# Patient Record
Sex: Female | Born: 1950 | Race: White | Hispanic: No | Marital: Married | Smoking: Never smoker
Health system: Southern US, Community
[De-identification: ages and names within clinical notes are randomized; demographics above are authoritative.]

## PROBLEM LIST (undated history)

## (undated) DIAGNOSIS — E559 Vitamin D deficiency, unspecified: Secondary | ICD-10-CM

## (undated) DIAGNOSIS — J189 Pneumonia, unspecified organism: Secondary | ICD-10-CM

## (undated) DIAGNOSIS — M76829 Posterior tibial tendinitis, unspecified leg: Secondary | ICD-10-CM

## (undated) DIAGNOSIS — E669 Obesity, unspecified: Secondary | ICD-10-CM

## (undated) DIAGNOSIS — I10 Essential (primary) hypertension: Secondary | ICD-10-CM

## (undated) DIAGNOSIS — N3010 Interstitial cystitis (chronic) without hematuria: Secondary | ICD-10-CM

## (undated) DIAGNOSIS — M549 Dorsalgia, unspecified: Secondary | ICD-10-CM

## (undated) DIAGNOSIS — G5760 Lesion of plantar nerve, unspecified lower limb: Secondary | ICD-10-CM

## (undated) DIAGNOSIS — R0602 Shortness of breath: Secondary | ICD-10-CM

## (undated) DIAGNOSIS — G5600 Carpal tunnel syndrome, unspecified upper limb: Secondary | ICD-10-CM

## (undated) DIAGNOSIS — E739 Lactose intolerance, unspecified: Secondary | ICD-10-CM

## (undated) DIAGNOSIS — I4891 Unspecified atrial fibrillation: Secondary | ICD-10-CM

## (undated) DIAGNOSIS — R600 Localized edema: Secondary | ICD-10-CM

## (undated) DIAGNOSIS — Z9289 Personal history of other medical treatment: Secondary | ICD-10-CM

## (undated) DIAGNOSIS — R002 Palpitations: Secondary | ICD-10-CM

## (undated) DIAGNOSIS — Z9981 Dependence on supplemental oxygen: Secondary | ICD-10-CM

## (undated) DIAGNOSIS — G4730 Sleep apnea, unspecified: Secondary | ICD-10-CM

## (undated) DIAGNOSIS — D649 Anemia, unspecified: Secondary | ICD-10-CM

## (undated) DIAGNOSIS — IMO0002 Reserved for concepts with insufficient information to code with codable children: Secondary | ICD-10-CM

## (undated) DIAGNOSIS — I272 Pulmonary hypertension, unspecified: Secondary | ICD-10-CM

## (undated) DIAGNOSIS — K589 Irritable bowel syndrome without diarrhea: Secondary | ICD-10-CM

## (undated) DIAGNOSIS — M2550 Pain in unspecified joint: Secondary | ICD-10-CM

## (undated) DIAGNOSIS — K219 Gastro-esophageal reflux disease without esophagitis: Secondary | ICD-10-CM

## (undated) DIAGNOSIS — E039 Hypothyroidism, unspecified: Secondary | ICD-10-CM

## (undated) DIAGNOSIS — J984 Other disorders of lung: Secondary | ICD-10-CM

## (undated) HISTORY — DX: Unspecified atrial fibrillation: I48.91

## (undated) HISTORY — DX: Lesion of plantar nerve, unspecified lower limb: G57.60

## (undated) HISTORY — DX: Reserved for concepts with insufficient information to code with codable children: IMO0002

## (undated) HISTORY — DX: Posterior tibial tendinitis, unspecified leg: M76.829

## (undated) HISTORY — PX: JOINT REPLACEMENT: SHX530

## (undated) HISTORY — PX: TOTAL HIP ARTHROPLASTY: SHX124

## (undated) HISTORY — DX: Gastro-esophageal reflux disease without esophagitis: K21.9

## (undated) HISTORY — DX: Dorsalgia, unspecified: M54.9

## (undated) HISTORY — DX: Irritable bowel syndrome, unspecified: K58.9

## (undated) HISTORY — DX: Carpal tunnel syndrome, unspecified upper limb: G56.00

## (undated) HISTORY — DX: Lactose intolerance, unspecified: E73.9

## (undated) HISTORY — DX: Hypothyroidism, unspecified: E03.9

## (undated) HISTORY — DX: Pain in unspecified joint: M25.50

## (undated) HISTORY — PX: CARDIAC CATHETERIZATION: SHX172

## (undated) HISTORY — DX: Essential (primary) hypertension: I10

## (undated) HISTORY — DX: Obesity, unspecified: E66.9

## (undated) HISTORY — PX: CARDIAC ELECTROPHYSIOLOGY MAPPING AND ABLATION: SHX1292

## (undated) HISTORY — PX: BACK SURGERY: SHX140

## (undated) HISTORY — PX: LASIK: SHX215

## (undated) HISTORY — DX: Vitamin D deficiency, unspecified: E55.9

## (undated) HISTORY — DX: Palpitations: R00.2

## (undated) HISTORY — DX: Localized edema: R60.0

---
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
## 1978-10-13 HISTORY — PX: TUBAL LIGATION: SHX77

## 1989-06-13 HISTORY — PX: CARPAL TUNNEL RELEASE: SHX101

## 1998-10-13 HISTORY — PX: DILATION AND CURETTAGE OF UTERUS: SHX78

## 1998-10-13 HISTORY — PX: VAGINAL HYSTERECTOMY: SUR661

## 1999-01-04 ENCOUNTER — Other Ambulatory Visit: Admission: RE | Admit: 1999-01-04 | Discharge: 1999-01-04 | Payer: Self-pay | Admitting: Obstetrics and Gynecology

## 1999-01-29 ENCOUNTER — Ambulatory Visit (HOSPITAL_COMMUNITY): Admission: RE | Admit: 1999-01-29 | Discharge: 1999-01-29 | Payer: Self-pay | Admitting: Obstetrics and Gynecology

## 1999-05-02 ENCOUNTER — Encounter (INDEPENDENT_AMBULATORY_CARE_PROVIDER_SITE_OTHER): Payer: Self-pay | Admitting: Specialist

## 1999-05-02 ENCOUNTER — Inpatient Hospital Stay (HOSPITAL_COMMUNITY): Admission: RE | Admit: 1999-05-02 | Discharge: 1999-05-05 | Payer: Self-pay | Admitting: Obstetrics and Gynecology

## 1999-06-14 HISTORY — PX: POSTERIOR FUSION LUMBAR SPINE: SUR632

## 1999-11-27 ENCOUNTER — Encounter: Admission: RE | Admit: 1999-11-27 | Discharge: 1999-11-27 | Payer: Self-pay | Admitting: Emergency Medicine

## 1999-11-27 ENCOUNTER — Encounter: Payer: Self-pay | Admitting: Emergency Medicine

## 2000-02-21 ENCOUNTER — Encounter: Admission: RE | Admit: 2000-02-21 | Discharge: 2000-02-21 | Payer: Self-pay | Admitting: Emergency Medicine

## 2000-02-21 ENCOUNTER — Encounter: Payer: Self-pay | Admitting: Emergency Medicine

## 2000-06-25 ENCOUNTER — Other Ambulatory Visit: Admission: RE | Admit: 2000-06-25 | Discharge: 2000-06-25 | Payer: Self-pay | Admitting: Obstetrics and Gynecology

## 2000-08-04 ENCOUNTER — Encounter: Admission: RE | Admit: 2000-08-04 | Discharge: 2000-08-25 | Payer: Self-pay | Admitting: Neurosurgery

## 2000-11-19 ENCOUNTER — Encounter: Admission: RE | Admit: 2000-11-19 | Discharge: 2000-11-19 | Payer: Self-pay | Admitting: Family Medicine

## 2000-11-19 ENCOUNTER — Encounter: Payer: Self-pay | Admitting: Family Medicine

## 2001-05-25 ENCOUNTER — Encounter: Payer: Self-pay | Admitting: Gastroenterology

## 2001-05-25 ENCOUNTER — Encounter: Admission: RE | Admit: 2001-05-25 | Discharge: 2001-05-25 | Payer: Self-pay | Admitting: Gastroenterology

## 2001-07-15 ENCOUNTER — Other Ambulatory Visit: Admission: RE | Admit: 2001-07-15 | Discharge: 2001-07-15 | Payer: Self-pay | Admitting: Obstetrics and Gynecology

## 2001-07-20 ENCOUNTER — Encounter: Admission: RE | Admit: 2001-07-20 | Discharge: 2001-07-20 | Payer: Self-pay | Admitting: Obstetrics and Gynecology

## 2001-07-20 ENCOUNTER — Encounter: Payer: Self-pay | Admitting: Obstetrics and Gynecology

## 2001-11-25 ENCOUNTER — Encounter: Admission: RE | Admit: 2001-11-25 | Discharge: 2001-12-17 | Payer: Self-pay | Admitting: Orthopedic Surgery

## 2001-12-28 ENCOUNTER — Encounter: Payer: Self-pay | Admitting: Family Medicine

## 2001-12-28 ENCOUNTER — Encounter: Admission: RE | Admit: 2001-12-28 | Discharge: 2001-12-28 | Payer: Self-pay | Admitting: Family Medicine

## 2002-06-24 ENCOUNTER — Ambulatory Visit (HOSPITAL_COMMUNITY): Admission: RE | Admit: 2002-06-24 | Discharge: 2002-06-24 | Payer: Self-pay | Admitting: Gastroenterology

## 2002-07-07 ENCOUNTER — Other Ambulatory Visit: Admission: RE | Admit: 2002-07-07 | Discharge: 2002-07-07 | Payer: Self-pay | Admitting: Obstetrics and Gynecology

## 2003-01-24 ENCOUNTER — Inpatient Hospital Stay (HOSPITAL_COMMUNITY): Admission: RE | Admit: 2003-01-24 | Discharge: 2003-01-27 | Payer: Self-pay | Admitting: Neurosurgery

## 2003-01-24 ENCOUNTER — Encounter: Payer: Self-pay | Admitting: Neurosurgery

## 2003-02-19 ENCOUNTER — Encounter: Payer: Self-pay | Admitting: *Deleted

## 2003-02-19 ENCOUNTER — Inpatient Hospital Stay (HOSPITAL_COMMUNITY): Admission: EM | Admit: 2003-02-19 | Discharge: 2003-02-27 | Payer: Self-pay | Admitting: *Deleted

## 2003-02-20 ENCOUNTER — Encounter: Payer: Self-pay | Admitting: Family Medicine

## 2003-02-21 ENCOUNTER — Encounter: Payer: Self-pay | Admitting: Family Medicine

## 2003-02-21 ENCOUNTER — Encounter (INDEPENDENT_AMBULATORY_CARE_PROVIDER_SITE_OTHER): Payer: Self-pay | Admitting: Cardiology

## 2003-03-03 ENCOUNTER — Emergency Department (HOSPITAL_COMMUNITY): Admission: EM | Admit: 2003-03-03 | Discharge: 2003-03-03 | Payer: Self-pay | Admitting: Emergency Medicine

## 2003-03-08 ENCOUNTER — Encounter: Admission: RE | Admit: 2003-03-08 | Discharge: 2003-03-08 | Payer: Self-pay | Admitting: Family Medicine

## 2003-06-05 ENCOUNTER — Ambulatory Visit (HOSPITAL_COMMUNITY): Admission: RE | Admit: 2003-06-05 | Discharge: 2003-06-05 | Payer: Self-pay | Admitting: Neurosurgery

## 2003-06-05 ENCOUNTER — Encounter: Payer: Self-pay | Admitting: Neurosurgery

## 2003-06-15 ENCOUNTER — Inpatient Hospital Stay (HOSPITAL_COMMUNITY): Admission: RE | Admit: 2003-06-15 | Discharge: 2003-06-18 | Payer: Self-pay | Admitting: Neurosurgery

## 2003-06-15 ENCOUNTER — Encounter: Payer: Self-pay | Admitting: Neurosurgery

## 2004-03-01 ENCOUNTER — Ambulatory Visit (HOSPITAL_COMMUNITY): Admission: RE | Admit: 2004-03-01 | Discharge: 2004-03-01 | Payer: Self-pay | Admitting: Cardiology

## 2004-03-13 ENCOUNTER — Other Ambulatory Visit: Admission: RE | Admit: 2004-03-13 | Discharge: 2004-03-13 | Payer: Self-pay | Admitting: Obstetrics and Gynecology

## 2004-06-16 ENCOUNTER — Encounter: Admission: RE | Admit: 2004-06-16 | Discharge: 2004-06-16 | Payer: Self-pay | Admitting: Orthopedic Surgery

## 2007-04-30 ENCOUNTER — Encounter: Admission: RE | Admit: 2007-04-30 | Discharge: 2007-04-30 | Payer: Self-pay | Admitting: Endocrinology

## 2007-10-14 HISTORY — PX: LAPAROSCOPIC GASTRIC BANDING: SHX1100

## 2007-11-30 ENCOUNTER — Ambulatory Visit (HOSPITAL_COMMUNITY): Admission: RE | Admit: 2007-11-30 | Discharge: 2007-11-30 | Payer: Self-pay | Admitting: Surgery

## 2007-12-08 ENCOUNTER — Encounter: Admission: RE | Admit: 2007-12-08 | Discharge: 2007-12-08 | Payer: Self-pay | Admitting: Surgery

## 2007-12-10 ENCOUNTER — Ambulatory Visit (HOSPITAL_COMMUNITY): Admission: RE | Admit: 2007-12-10 | Discharge: 2007-12-10 | Payer: Self-pay | Admitting: Surgery

## 2008-04-25 ENCOUNTER — Encounter: Admission: RE | Admit: 2008-04-25 | Discharge: 2008-07-04 | Payer: Self-pay | Admitting: Surgery

## 2008-05-09 ENCOUNTER — Encounter (INDEPENDENT_AMBULATORY_CARE_PROVIDER_SITE_OTHER): Payer: Self-pay | Admitting: Surgery

## 2008-05-09 ENCOUNTER — Ambulatory Visit (HOSPITAL_COMMUNITY): Admission: RE | Admit: 2008-05-09 | Discharge: 2008-05-10 | Payer: Self-pay | Admitting: Surgery

## 2008-08-03 ENCOUNTER — Encounter: Admission: RE | Admit: 2008-08-03 | Discharge: 2008-09-11 | Payer: Self-pay | Admitting: Surgery

## 2008-09-14 ENCOUNTER — Encounter: Admission: RE | Admit: 2008-09-14 | Discharge: 2008-12-13 | Payer: Self-pay | Admitting: Surgery

## 2008-09-15 ENCOUNTER — Encounter: Admission: RE | Admit: 2008-09-15 | Discharge: 2008-09-15 | Payer: Self-pay | Admitting: Internal Medicine

## 2008-12-27 ENCOUNTER — Encounter: Admission: RE | Admit: 2008-12-27 | Discharge: 2008-12-27 | Payer: Self-pay | Admitting: Surgery

## 2009-01-08 ENCOUNTER — Encounter: Admission: RE | Admit: 2009-01-08 | Discharge: 2009-01-08 | Payer: Self-pay | Admitting: Orthopaedic Surgery

## 2009-03-06 ENCOUNTER — Ambulatory Visit (HOSPITAL_COMMUNITY): Admission: RE | Admit: 2009-03-06 | Discharge: 2009-03-06 | Payer: Self-pay | Admitting: Cardiology

## 2009-03-14 ENCOUNTER — Encounter: Admission: RE | Admit: 2009-03-14 | Discharge: 2009-03-14 | Payer: Self-pay | Admitting: Surgery

## 2009-03-20 ENCOUNTER — Encounter: Admission: RE | Admit: 2009-03-20 | Discharge: 2009-03-20 | Payer: Self-pay | Admitting: Orthopaedic Surgery

## 2009-05-08 ENCOUNTER — Encounter: Admission: RE | Admit: 2009-05-08 | Discharge: 2009-08-06 | Payer: Self-pay | Admitting: Obstetrics and Gynecology

## 2009-06-20 ENCOUNTER — Encounter: Admission: RE | Admit: 2009-06-20 | Discharge: 2009-06-20 | Payer: Self-pay | Admitting: Orthopaedic Surgery

## 2009-08-03 ENCOUNTER — Inpatient Hospital Stay (HOSPITAL_COMMUNITY): Admission: RE | Admit: 2009-08-03 | Discharge: 2009-08-08 | Payer: Self-pay | Admitting: Orthopaedic Surgery

## 2009-12-07 ENCOUNTER — Encounter: Payer: Self-pay | Admitting: Cardiology

## 2009-12-12 ENCOUNTER — Encounter: Payer: Self-pay | Admitting: Cardiology

## 2009-12-13 ENCOUNTER — Ambulatory Visit: Payer: Self-pay | Admitting: Cardiology

## 2009-12-13 DIAGNOSIS — R0602 Shortness of breath: Secondary | ICD-10-CM

## 2009-12-19 ENCOUNTER — Encounter: Admission: RE | Admit: 2009-12-19 | Discharge: 2009-12-19 | Payer: Self-pay | Admitting: Orthopaedic Surgery

## 2009-12-26 ENCOUNTER — Encounter: Payer: Self-pay | Admitting: Cardiology

## 2009-12-26 ENCOUNTER — Ambulatory Visit (HOSPITAL_COMMUNITY): Admission: RE | Admit: 2009-12-26 | Discharge: 2009-12-26 | Payer: Self-pay | Admitting: Cardiology

## 2009-12-26 ENCOUNTER — Ambulatory Visit: Payer: Self-pay

## 2009-12-26 ENCOUNTER — Ambulatory Visit: Payer: Self-pay | Admitting: Cardiology

## 2010-04-17 ENCOUNTER — Telehealth: Payer: Self-pay | Admitting: Cardiology

## 2010-04-18 ENCOUNTER — Encounter: Admission: RE | Admit: 2010-04-18 | Discharge: 2010-04-18 | Payer: Self-pay | Admitting: Orthopaedic Surgery

## 2010-06-20 ENCOUNTER — Ambulatory Visit: Payer: Self-pay | Admitting: Cardiology

## 2010-06-20 DIAGNOSIS — I10 Essential (primary) hypertension: Secondary | ICD-10-CM

## 2010-07-31 ENCOUNTER — Telehealth (INDEPENDENT_AMBULATORY_CARE_PROVIDER_SITE_OTHER): Payer: Self-pay | Admitting: *Deleted

## 2010-08-09 ENCOUNTER — Inpatient Hospital Stay (HOSPITAL_COMMUNITY): Admission: RE | Admit: 2010-08-09 | Discharge: 2010-08-13 | Payer: Self-pay | Admitting: Orthopaedic Surgery

## 2010-11-12 NOTE — Assessment & Plan Note (Signed)
Summary: f42m   Primary Provider:  Dr. Christell Constant  CC:  6 month follow up. pt states she is doing okay.  Pt getting ready for second hip operation Oct. 28th.   .  History of Present Illness: 60 yo with history of morbid obesity s/p lap band surgery and atrial fibrillation s/p ablation presents for cardiology followup.  Patient developed atrial fibrillation in 2004.  She had a cath in 2004 showing no significant CAD.  She had atrial fibrillation ablation in 2005 with a redo in 2007.  She has had no documented atrial fibrillation since 2007 and is not taking coumadin any longer.  She had a right hip replacement in 10/10.  She has had no lightheadedness or syncope.  Echo in 3/11 showed normal LV systolic function and no significant valvular dysfunction.  She does occasionally feel her heart flutter.  She now has significant pain in her left hip and left THR is planned for 10/11.  She has too much pain to do much exercise.  She could climb a flight of steps if she had to, but it would be difficult due to the pain.  No significant exertional dyspnea or chest pain.  Weight is down 6 lbs since last appointment.   ECG: NSR, normal  Labs (3/11): BNP 65, K 4.2, creatinine 0.83, TSH normal, HCT 39.1  Current Medications (verified): 1)  Lisinopril 20 Mg Tabs (Lisinopril) .... Take One Tablet Once Daily 2)  Vitamin D 1000 Unit Tabs (Cholecalciferol) .... Take One Tablet Three Times A Day 3)  Celebrex 200 Mg Caps (Celecoxib) .... Take One Tablet Two Times A Day 4)  Aspirin 81 Mg Tabs (Aspirin) .... Once Daily 5)  Tramadol Hcl 50 Mg Tabs (Tramadol Hcl) .... 1/2 Tablet in The Am and 1 Tablet in The Pm 6)  Dhea 25 Mg Tabs (Prasterone (Dhea)) .... Once Daily  Allergies (verified): 1)  ! Keflex 2)  ! Sulfa 3)  ! Pcn 4)  ! * Avelox 5)  ! Flecainide Acetate  Past History:  Past Medical History: 1. L4/ L5 disk disease, s/p surgery 2. hypertension 3. Obesity: s/p Lap band surgery 7/09 4. Atrial fibrillation:  Began in 2004.  Had ablations at [facility] in 2005 and 2007.  She is off coumadin now with no noted recurrent atrial fibrillation since 2007.  5. Left heart cath (2004): No significant CAD, false + cardiolite. EF 60%.  6. Echo (3/11): LV EF 55-60%, no significant valvular dysfunction, RV-RA gradient 26 mmHg.  7. Hypothyroidism 8. Right hip replacement 10/10 9. GERD: Resolved after lap band  Family History: Reviewed history from 12/13/2009 and no changes required. Grandfather with 6 heart attacks, she is not sure what age he first had a heart attack.  Father with hypertension and diabetes, mother on medicines for irregular heart beat.   Family history negative for cancer or thyroid disease.  Social History: Reviewed history from 12/13/2009 and no changes required. 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  Alcohol Use - no  Review of Systems       All systems reviewed and negative except as per HPI.   Vital Signs:  Patient profile:   60 year old female Height:      66 inches Weight:      279 pounds BMI:     45.19 Pulse rate:   57 / minute Pulse rhythm:   regular BP sitting:   144 / 84  (left  arm) Cuff size:   large  Vitals Entered By: Judithe Modest CMA (June 20, 2010 3:55 PM)  Physical Exam  General:  Well developed, well nourished, in no acute distress. Obese.  Neck:  Neck supple,JVP 7 cm. No masses, thyromegaly or abnormal cervical nodes. Lungs:  Clear bilaterally to auscultation and percussion. Heart:  Non-displaced PMI, chest non-tender; regular rate and rhythm, S1, S2 without murmurs, rubs or gallops. Carotid upstroke normal, no bruit. Pedals normal pulses. No edema, no varicosities. Abdomen:  Bowel sounds positive; abdomen soft and non-tender without masses, organomegaly, or hernias noted. No hepatosplenomegaly. Obese.  Extremities:  No clubbing or cyanosis. Neurologic:  Alert and oriented x 3. Psych:  Normal  affect.   Impression & Recommendations:  Problem # 1:  ATRIAL FIBRILLATION (ICD-427.31) In sinus rhythm today.  No known recurrence of atrial fibrillation since repeat ablation in 2007.  However, she does have occasional palpitations.  I will get a 3 week event monitor to make sure that she does not have recurrent atrial fibrillation, in which case we would need to discuss anticoagulation.  She will take ASA 325 mg daily.   Problem # 2:  HYPERTENSION, UNSPECIFIED (ICD-401.9) BP is mildly elevated.  Will continue current dose of lisinopril for now.   Problem # 3:  PRE-OPERATIVE ASSESSMENT Patient is not high risk from a cardiovascular perspective for surgery.  She will be at risk of recurrent atrial fibrillation peri-operatively.  No further cardiac testing is necessary.   Patient Instructions: 1)  Your physician has recommended you make the following change in your medication:  2)  Increase Aspirin to 325mg  daily.-this should be buffered or coated. 3)  Your physician has recommended that you wear an event monitor.  Event monitors are medical devices that record the heart's electrical activity. Doctors most often use these monitors to diagnose arrhythmias. Arrhythmias are problems with the speed or rhythm of the heartbeat. The monitor is a small, portable device. You can wear one while you do your normal daily activities. This is usually used to diagnose what is causing palpitations/syncope (passing out). 3 WEEK MONITOR 4)  Your physician wants you to follow-up in: 6 months with Dr Shirlee Latch.  You will receive a reminder letter in the mail two months in advance. If you don't receive a letter, please call our office to schedule the follow-up appointment.

## 2010-11-12 NOTE — Assessment & Plan Note (Signed)
Summary: np6/get established/jml   Primary Provider:  Dr. Christell Constant  CC:  new patient to establish.  History of Present Illness: 60 yo with history of morbid obesity s/p lap band surgery and atrial fibrillation s/p ablation presents to establish cardiology followup.  Patient developed atrial fibrillation in 2004.  She had a cath in 2004 showing no significant CAD.  She had atrial fibrillation ablation in 2005 with a redo in 2007.  She has had no documented atrial fibrillation since 2007 and is not taking coumadin any longer.  She had a right hip replacement in 10/10.  She has been fatigued since surgery but has been trying to walk.  She was only able to walk minimally for the 6 months prior to surgery and became significantly deconditioned.  Currently, she is talking 2-3 laps around her driveway (about 50 yards) before becoming short of breath. No chest pain.  She is not short of breath walking in her house.  No ortohpnea/PND.  She can climb 1 flight of steps but is short of breath at the top.  No palpitations, lightheadedness, or syncope.   ECG: NSR, 1st degree AV block  Labs (3/11): BNP 65, K 4.2, creatinine 0.83, TSH normal, HCT 39.1  Current Medications (verified): 1)  Lisinopril 20 Mg Tabs (Lisinopril) .... Take One Tablet Once Daily 2)  Vitamin D 1000 Unit Tabs (Cholecalciferol) .... Take One Tablet Three Times A Day 3)  Nabumetone 500 Mg Tabs (Nabumetone) .... Take One Tablet Two Times A Day 4)  Aspirin 81 Mg Tabs (Aspirin) .... Once Daily  Allergies (verified): 1)  ! Keflex 2)  ! Sulfa 3)  ! Pcn 4)  ! * Avelox 5)  ! Flecainide Acetate  Past History:  Past Medical History: 1. L4/ L5 disk disease, s/p surgery 2. hypertension 3. Obesity: s/p Lap band surgery 7/09 4. Atrial fibrillation: Began in 2004.  Had ablations at [facility] in 2005 and 2007.  She is off coumadin now with no noted recurrent atrial fibrillation since 2007.  5. Left heart cath (2004): No significant CAD, false  + cardiolite. EF 60%.  6. 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 Hypothyroidism 8. Right hip replacement 10/10 9. GERD: Resolved after lap band  Family History: Grandfather with 6 heart attacks, she is not sure what age he first had a heart attack.  Father with hypertension and diabetes, mother on medicines for irregular heart beat.   Family history negative for cancer or thyroid disease.  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  Alcohol Use - no  Review of Systems       All systems reviewed and negative except as per HPI.   Vital Signs:  Patient profile:   60 year old female Height:      66 inches Weight:      285 pounds BMI:     46.17 Pulse rate:   64 / minute Pulse rhythm:   regular BP sitting:   122 / 82  (left arm) Cuff size:   large  Vitals Entered By: Judithe Modest CMA (December 13, 2009 4:26 PM)  Physical Exam  General:  Well developed, well nourished, in no acute distress. Obese.  Head:  normocephalic and atraumatic Nose:  no deformity, discharge, inflammation, or lesions Mouth:  Teeth, gums and palate normal. Oral mucosa normal. Neck:  Neck supple,JVP 8 cm. No masses, thyromegaly or abnormal cervical  nodes. Lungs:  Clear bilaterally to auscultation and percussion. Heart:  Non-displaced PMI, chest non-tender; regular rate and rhythm, S1, S2 without murmurs, rubs or gallops. Carotid upstroke normal, no bruit. Pedals normal pulses. No edema, no varicosities. Abdomen:  Bowel sounds positive; abdomen soft and non-tender without masses, organomegaly, or hernias noted. No hepatosplenomegaly. Obese.  Extremities:  No clubbing or cyanosis. Neurologic:  Alert and oriented x 3. Skin:  Intact without lesions or rashes. Psych:  Normal affect.   Impression & Recommendations:  Problem # 1:  SHORTNESS OF BREATH (ICD-786.05) I suspect that this is due to obesity, deconditioning, and  possible mild diastolic CHF.  We will get an echocardiogram to assess LV systolic and diastolic function.  She needs to work on gradually increasing her activity level.   Problem # 2:  ATRIAL FIBRILLATION (ICD-427.31) In sinus rhythm today.  No known recurrence of atrial fibrillation since repeat ablation in 2007.  Continue ASA.    Patient had lipids done yesterday, will review the results.   Other Orders: Echocardiogram (Echo)  Patient Instructions: 1)  Your physician has requested that you have an echocardiogram.  Echocardiography is a painless test that uses sound waves to create images of your heart. It provides your doctor with information about the size and shape of your heart and how well your heart's chambers and valves are working.  This procedure takes approximately one hour. There are no restrictions for this procedure. 2)  Wednesday March 16,2011 at 4PM--this will be at [address] [employer] [address] 3)  Your physician wants you to follow-up in:  6 months with Dr [name].  You will receive a reminder letter in the mail two months in advance. If you don't receive a letter, please call our office to schedule the follow-up appointment.

## 2010-11-12 NOTE — Progress Notes (Signed)
Summary: Faxed LOV, Chest X-ray, Echo, & EKG to Elease Hashimoto at [facility] Pre[facility]  Faxed LOV, Chest X-ray, Echo, & EKG to Elease Hashimoto at [facility] [phone] F). Marylou Mccoy  July 31, 2010 11:06 AM

## 2010-11-12 NOTE — Consult Note (Signed)
Summary: [facility]  [facility]   Imported By: Marylou Mccoy 02/04/2010 11:57:48  _____________________________________________________________________  External Attachment:    Type:   Image     Comment:   External Document

## 2010-11-12 NOTE — Progress Notes (Signed)
Summary: refill request  Phone Note Refill Request Message from:  Patient on April 17, 2010 1:22 PM  Refills Requested: Medication #1:  LISINOPRIL 20 MG TABS take one tablet once daily [facility]   Method Requested: Telephone to Pharmacy Initial call taken by: Glynda Jaeger,  April 17, 2010 1:22 PM  Follow-up for Phone Call        [facility] Katina Dung, RN, BSN  April 17, 2010 2:24 PM  talked with pt by telephone-- Rx into pharmacy- appt made for pt with Dr Shirlee Latch for 06/20/10    New/Updated Medications: LISINOPRIL 20 MG TABS (LISINOPRIL) take one tablet once daily Prescriptions: LISINOPRIL 20 MG TABS (LISINOPRIL) take one tablet once daily  #30 x 6   Entered by:   Katina Dung, RN, BSN   Authorized by:   [name], MD   Signed by:   Katina Dung, RN, BSN on 04/17/2010   Method used:   [facility]  [address] [id number]       [address]       [place], [place]  [address]       Ph: [phone]       Fax: [phone]   RxID:   [id number]

## 2010-12-25 LAB — TYPE AND SCREEN
ABO/RH(D): A POS
Unit division: 0

## 2010-12-25 LAB — CBC
HCT: 33 % — ABNORMAL LOW (ref 36.0–46.0)
Hemoglobin: 11.1 g/dL — ABNORMAL LOW (ref 12.0–15.0)
Hemoglobin: 12.7 g/dL (ref 12.0–15.0)
Hemoglobin: 7.9 g/dL — ABNORMAL LOW (ref 12.0–15.0)
MCH: 29.1 pg (ref 26.0–34.0)
MCH: 29.6 pg (ref 26.0–34.0)
MCH: 29.6 pg (ref 26.0–34.0)
MCH: 29.7 pg (ref 26.0–34.0)
MCHC: 33.7 g/dL (ref 30.0–36.0)
MCHC: 33.8 g/dL (ref 30.0–36.0)
MCHC: 34.1 g/dL (ref 30.0–36.0)
MCV: 86.1 fL (ref 78.0–100.0)
MCV: 86.9 fL (ref 78.0–100.0)
MCV: 87 fL (ref 78.0–100.0)
MCV: 87.8 fL (ref 78.0–100.0)
Platelets: 140 10*3/uL — ABNORMAL LOW (ref 150–400)
Platelets: 144 10*3/uL — ABNORMAL LOW (ref 150–400)
Platelets: 172 10*3/uL (ref 150–400)
Platelets: 218 10*3/uL (ref 150–400)
RBC: 2.68 MIL/uL — ABNORMAL LOW (ref 3.87–5.11)
RBC: 2.87 MIL/uL — ABNORMAL LOW (ref 3.87–5.11)
RBC: 3.24 MIL/uL — ABNORMAL LOW (ref 3.87–5.11)
RBC: 3.77 MIL/uL — ABNORMAL LOW (ref 3.87–5.11)
WBC: 10.6 10*3/uL — ABNORMAL HIGH (ref 4.0–10.5)
WBC: 8.7 10*3/uL (ref 4.0–10.5)

## 2010-12-25 LAB — BASIC METABOLIC PANEL
BUN: 8 mg/dL (ref 6–23)
BUN: 9 mg/dL (ref 6–23)
CO2: 29 mEq/L (ref 19–32)
CO2: 30 mEq/L (ref 19–32)
CO2: 31 mEq/L (ref 19–32)
CO2: 31 mEq/L (ref 19–32)
Calcium: 8.1 mg/dL — ABNORMAL LOW (ref 8.4–10.5)
Calcium: 8.3 mg/dL — ABNORMAL LOW (ref 8.4–10.5)
Calcium: 8.3 mg/dL — ABNORMAL LOW (ref 8.4–10.5)
Calcium: 9.5 mg/dL (ref 8.4–10.5)
Chloride: 105 mEq/L (ref 96–112)
Chloride: 106 mEq/L (ref 96–112)
Creatinine, Ser: 0.72 mg/dL (ref 0.4–1.2)
Creatinine, Ser: 0.83 mg/dL (ref 0.4–1.2)
GFR calc Af Amer: >60 mL/min (ref >60)
GFR calc Af Amer: >60 mL/min (ref >60)
GFR calc non Af Amer: >60 mL/min (ref >60)
Glucose, Bld: 109 mg/dL — ABNORMAL HIGH (ref 70–99)
Glucose, Bld: 128 mg/dL — ABNORMAL HIGH (ref 70–99)
Glucose, Bld: 77 mg/dL (ref 70–99)
Sodium: 140 mEq/L (ref 135–145)
Sodium: 141 mEq/L (ref 135–145)

## 2010-12-25 LAB — PROTIME-INR
INR: 1.41 (ref 0.00–1.49)
INR: 1.74 — ABNORMAL HIGH (ref 0.00–1.49)
Prothrombin Time: 13.1 seconds (ref 11.6–15.2)
Prothrombin Time: 15.7 seconds — ABNORMAL HIGH (ref 11.6–15.2)
Prothrombin Time: 20.5 seconds — ABNORMAL HIGH (ref 11.6–15.2)

## 2010-12-25 LAB — SURGICAL PCR SCREEN: MRSA, PCR: NEGATIVE

## 2010-12-25 LAB — POCT I-STAT 4, (NA,K, GLUC, HGB,HCT)
HCT: 30 % — ABNORMAL LOW (ref 36.0–46.0)
Sodium: 138 mEq/L (ref 135–145)

## 2011-01-04 ENCOUNTER — Encounter: Payer: Self-pay | Admitting: Cardiology

## 2011-01-14 ENCOUNTER — Encounter: Payer: Self-pay | Admitting: Cardiology

## 2011-01-14 ENCOUNTER — Ambulatory Visit (INDEPENDENT_AMBULATORY_CARE_PROVIDER_SITE_OTHER): Payer: BC Managed Care – PPO | Admitting: Cardiology

## 2011-01-14 DIAGNOSIS — I4891 Unspecified atrial fibrillation: Secondary | ICD-10-CM

## 2011-01-14 DIAGNOSIS — I10 Essential (primary) hypertension: Secondary | ICD-10-CM

## 2011-01-14 NOTE — Patient Instructions (Signed)
Schedule an appointment to see Dr Shirlee Latch in year with Dr Shirlee Latch.(April 2013).

## 2011-01-15 NOTE — Assessment & Plan Note (Signed)
No known atrial fibrillation recurrence since ablation in 2007.  No afib symptoms.  Continue ASA 325.

## 2011-01-15 NOTE — Progress Notes (Signed)
History of Present Illness: 60 yo with history of morbid obesity s/p lap band surgery and atrial fibrillation s/p ablation presents for cardiology followup.  Patient developed atrial fibrillation in 2004.  She had a cath in 2004 showing no significant CAD.  She had atrial fibrillation ablation in 2005 with a redo in 2007.  She has had no documented atrial fibrillation since 2007 and is not taking coumadin any longer.  She had a right hip replacement in 10/10 and a left hip replacement in 10/11.  This was uncomplicated.   Echo in 3/11 showed normal LV systolic function and no significant valvular dysfunction.    Hip pain is resolved s/p L THR.  She is doing well, walking in her yard without dyspnea.  No chest pain, no palpitations.  Unfortunately, her weight has not gone down.   ECG: NSR, normal  Labs (3/11): BNP 65, K 4.2, creatinine 0.83, TSH normal, HCT 39.1  Allergies (verified):  1)  ! Keflex 2)  ! Sulfa 3)  ! Pcn 4)  ! * Avelox 5)  ! Flecainide Acetate  Past Medical History: 1. L4/ L5 disk disease, s/p surgery 2. hypertension 3. Obesity: s/p Lap band surgery 7/09 4. Atrial fibrillation: Began in 2004.  Had ablations at [facility] in 2005 and 2007.  She is off coumadin now with no noted recurrent atrial fibrillation since 2007.  5. Left heart cath (2004): No significant CAD, false + cardiolite. EF 60%.  6. Echo (3/11): LV EF 55-60%, no significant valvular dysfunction, RV-RA gradient 26 mmHg.  7. Hypothyroidism 8. Right hip replacement 10/10, left hip replacement 10/11 9. GERD: Resolved after lap band  Family History: Grandfather with 6 heart attacks, she is not sure what age he first had a heart attack.  Father with hypertension and diabetes, mother on medicines for irregular heart beat.   Family history negative for cancer or thyroid disease.  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  Alcohol Use - no  Current  Outpatient Prescriptions  Medication Sig Dispense Refill  . aspirin 325 MG tablet Take 325 mg by mouth daily.        . Cholecalciferol (VITAMIN D-3) 5000 UNITS TABS Take 5,000 mg by mouth. Take 5000 mg daily Monday - Friday.       Marland Kitchen DHEA 25 MG CAPS daliy       . lisinopril (PRINIVIL,ZESTRIL) 20 MG tablet Take 20 mg by mouth daily.          BP 132/84  Pulse 59  Resp 18  Ht 5\' 6"  (1.676 m)  Wt 280 lb (127.007 kg)  BMI 45.19 kg/m2 General:  Well developed, well nourished, in no acute distress. Obese.  Neck:  Neck supple,JVP 7 cm. No masses, thyromegaly or abnormal cervical nodes. Lungs:  Clear bilaterally to auscultation and percussion. Heart:  Non-displaced PMI, chest non-tender; regular rate and rhythm, S1, S2 without murmurs, rubs or gallops. Carotid upstroke normal, no bruit. Pedals normal pulses. No edema, no varicosities. Abdomen:  Bowel sounds positive; abdomen soft and non-tender without masses, organomegaly, or hernias noted. No hepatosplenomegaly. Obese.  Extremities:  No clubbing or cyanosis. Neurologic:  Alert and oriented x 3. Psych:  Normal affect.

## 2011-01-15 NOTE — Assessment & Plan Note (Signed)
BP under good control on lisinopril.

## 2011-01-16 LAB — BASIC METABOLIC PANEL
BUN: 6 mg/dL (ref 6–23)
BUN: 9 mg/dL (ref 6–23)
CO2: 29 mEq/L (ref 19–32)
CO2: 30 mEq/L (ref 19–32)
Calcium: 9.5 mg/dL (ref 8.4–10.5)
Chloride: 101 mEq/L (ref 96–112)
Chloride: 101 mEq/L (ref 96–112)
Chloride: 103 mEq/L (ref 96–112)
Creatinine, Ser: 0.67 mg/dL (ref 0.4–1.2)
Creatinine, Ser: 0.69 mg/dL (ref 0.4–1.2)
Creatinine, Ser: 0.74 mg/dL (ref 0.4–1.2)
GFR calc Af Amer: >60 mL/min (ref >60)
GFR calc Af Amer: >60 mL/min (ref >60)
GFR calc non Af Amer: >60 mL/min (ref >60)
GFR calc non Af Amer: >60 mL/min (ref >60)
Glucose, Bld: 111 mg/dL — ABNORMAL HIGH (ref 70–99)
Glucose, Bld: 112 mg/dL — ABNORMAL HIGH (ref 70–99)
Glucose, Bld: 128 mg/dL — ABNORMAL HIGH (ref 70–99)
Potassium: 3.8 mEq/L (ref 3.5–5.1)
Potassium: 4.5 mEq/L (ref 3.5–5.1)
Sodium: 135 mEq/L (ref 135–145)
Sodium: 140 mEq/L (ref 135–145)

## 2011-01-16 LAB — PROTIME-INR
INR: 0.91 (ref 0.00–1.49)
INR: 1.23 (ref 0.00–1.49)
Prothrombin Time: 14.7 seconds (ref 11.6–15.2)
Prothrombin Time: 15.2 seconds (ref 11.6–15.2)

## 2011-01-16 LAB — CROSSMATCH

## 2011-01-16 LAB — DIFFERENTIAL
Basophils Absolute: 0 10*3/uL (ref 0.0–0.1)
Basophils Relative: 1 % (ref 0–1)
Lymphocytes Relative: 18 % (ref 12–46)
Monocytes Absolute: 0.3 10*3/uL (ref 0.1–1.0)
Monocytes Relative: 5 % (ref 3–12)
Neutro Abs: 5 10*3/uL (ref 1.7–7.7)
Neutrophils Relative %: 76 % (ref 43–77)

## 2011-01-16 LAB — URINALYSIS, ROUTINE W REFLEX MICROSCOPIC
Nitrite: NEGATIVE
Protein, ur: NEGATIVE mg/dL
Specific Gravity, Urine: 1.025 (ref 1.005–1.030)
Urobilinogen, UA: 0.2 mg/dL (ref 0.0–1.0)

## 2011-01-16 LAB — CBC
HCT: 21.1 % — ABNORMAL LOW (ref 36.0–46.0)
HCT: 24.7 % — ABNORMAL LOW (ref 36.0–46.0)
HCT: 25.5 % — ABNORMAL LOW (ref 36.0–46.0)
Hemoglobin: 12.3 g/dL (ref 12.0–15.0)
Hemoglobin: 7.3 g/dL — CL (ref 12.0–15.0)
Hemoglobin: 8.4 g/dL — ABNORMAL LOW (ref 12.0–15.0)
Hemoglobin: 8.5 g/dL — ABNORMAL LOW (ref 12.0–15.0)
MCHC: 33 g/dL (ref 30.0–36.0)
MCV: 87.8 fL (ref 78.0–100.0)
MCV: 87.9 fL (ref 78.0–100.0)
MCV: 88.4 fL (ref 78.0–100.0)
MCV: 89 fL (ref 78.0–100.0)
Platelets: 141 10*3/uL — ABNORMAL LOW (ref 150–400)
RBC: 2.52 MIL/uL — ABNORMAL LOW (ref 3.87–5.11)
RBC: 2.78 MIL/uL — ABNORMAL LOW (ref 3.87–5.11)
RBC: 4.23 MIL/uL (ref 3.87–5.11)
RDW: 14.2 % (ref 11.5–15.5)
RDW: 14.2 % (ref 11.5–15.5)
RDW: 14.2 % (ref 11.5–15.5)
RDW: 14.3 % (ref 11.5–15.5)
WBC: 9.4 10*3/uL (ref 4.0–10.5)

## 2011-01-16 LAB — POCT I-STAT 4, (NA,K, GLUC, HGB,HCT)
HCT: 32 % — ABNORMAL LOW (ref 36.0–46.0)
Sodium: 135 mEq/L (ref 135–145)

## 2011-01-16 LAB — APTT: aPTT: 30 seconds (ref 24–37)

## 2011-02-25 NOTE — Op Note (Signed)
Heather, [name]              ACCOUNT NO.:  [id number]   MEDICAL RECORD NO.:  [id number]          PATIENT TYPE:  OIB   LOCATION:  0098                         FACILITY:  [facility]   PHYSICIAN:  Sandria Bales. Ezzard Standing, M.D.  DATE OF BIRTH:  1951-08-01   DATE OF PROCEDURE:  05/09/2008  DATE OF DISCHARGE:                               OPERATIVE REPORT   Date of Surgery ?   PREOPERATIVE DIAGNOSES:  Morbid obesity (weight of 342, body mass index  of 55.01)   POSTOPERATIVE DIAGNOSES:  Morbid obesity (weight of 342, body mass index  of 55.01)   PROCEDURE:  1. Lap band placement, AP standard.  2. Removal of omental mass, approximately 1.5 cm.   SURGEON:  Dr. Ezzard Standing.   FIRST ASSISTANT:  Thornton Park. Daphine Deutscher, MD   ANESTHESIA:  General endotracheal.   ESTIMATED BLOOD LOSS:  Minimal.   INDICATIONS FOR PROCEDURE:  Heather [name] is a 60 year old white female,  who is a patient of Dr. Vernon Prey.  She has been overweight much of her  adult life.  She now comes for attempted laparoscopic band operation.   The indications and potential complications of lap band were explained  to the patient.  The potential complications, include but are not  limited to bleeding, infection, bowel injury, slippage, erosions and  long-term nutritional consequences.   OPERATIVE NOTE:  The patient placed in a supine position and given a  general endotracheal anesthetic.  She was given 1 gm of Ancef at the  initiation of procedure, had her abdomen prepped with Techni-Care and  then sterilely draped.  A timeout was held, identifying the patient and  procedure.   I then accessed the abdominal cavity with an 11-mm Ethicon trocar.  Abdominal exploration revealed right and left lobes of the liver  unremarkable.  The gallbladder that I could see was unremarkable.  The  bowel that I could see was unremarkable.  The stomach was unremarkable.  She did have this adhesion to her falciform ligament.  Her falciform  ligament was  sort of bilobed, and this was a small knuckle of fat which  looked like it had twisted or infarcted.  I will eventually take this  out.   I placed four additional trocars; a 5 mm subxiphoid trocar, a 15 mm in  the right subcostal position, an 11 mm in the right paramedian, and an  11 mm in the left paramedian.   Dissection was carried out along first the angle of His to the left of  the esophagogastric junction.  Even though her weight was fairly high, a  BMI of 55.01, she actually had a reasonably small amount of fat up  around her esophagogastric junction.  I then went in through the  gastrocolic ligament.  I found the right crus and went just inside the  right crus.   Again, her fat in her upper abdomen was less was less than her total  body fat and I thought I could get away with an AP standard, so I went  on and passed the finger dissector behind the esophagogastric junction,  passed  the silastic tubing of the AP standard around.  I then placed the  sizing balloon down and we blew the balloon up and put mL of air and  pulled this up, but there was no hiatal hernia or defect noted.   The balloon was then passed through the esophagogastric junction.  I  cinched the band over the sizing tube. After I closed the band, I  removed the sizer.  I then imbricated the stomach over the band in three  positions using 0 Ethibond suture.   The band rotated without restriction and I took photos of the band that  I placed in the chart.  I identified this 1.5 cm mass that looked like  scarred omentum.  I resected the mass with Bovie electrocautery and  placed it in a EndoCatch bag and delivered it through the 15 mm trocar.  I will send this to pathology.   I removed the liver retractor.  I took the tubing out through the right  paramedian incision.  The trocars were removed in turn with no bleeding  at any trocar site.   I then created an approximately 4 cm incision in the right upper   quadrant, where I attached the tubing to the silastic device and sewed  this in place with 4-0 Prolene sutures.   This reservoir was tacked to the abdominal fascia using 0 Prolene in  four positions.  I then closed the subcutaneous tissue over the  reservoir with first a 3-0 Vicryl  and then closed the skin with a 5-0  Monocryl suture, painted each wound with a tincture of benzoin and Steri-  Strips.  The patient tolerated the procedure well.  Sponge and needle  counts were correct at the end of the case.  She was transferred to the  recovery room in good condition.      Sandria Bales. Ezzard Standing, M.D.  Electronically Signed     DHN/MEDQ  D:  05/09/2008  T:  05/09/2008  Job:  [id number]   cc:   Ernestina Penna, M.D.  Fax: [phone]   John L. Rendall, M.D.  Fax: [phone]   Madaline Savage, M.D.  Fax: [phone]   Danae Orleans. Venetia Maxon, M.D.  Fax: [phone]   Dorisann Frames, M.D.

## 2011-02-28 NOTE — H&P (Signed)
NAME:  Heather [name], Heather [name]                        ACCOUNT NO.:  [id number]   MEDICAL RECORD NO.:  [id number]                   PATIENT TYPE:  INP   LOCATION:  4743                                 FACILITY:  [facility]   PHYSICIAN:  Leighton Roach McDiarmid, M.D.             DATE OF BIRTH:  02/10/1951   DATE OF ADMISSION:  02/19/2003  DATE OF DISCHARGE:                                HISTORY & PHYSICAL   PRIMARY CARE PHYSICIAN:  Dr. Ernestina Penna at [facility].   CHIEF COMPLAINT:  Irregular heart beat.   HISTORY OF PRESENT ILLNESS:  This 60 year old white female presents to the  [facility] Emergency Room with complaint of irregular heart beat which  began at 0830 on Friday, approximately 45 hours ago, after the patient took  three tabs of Motrin.  The palpitations were intermittent that day and  resolved after 24 hours.  The palpitations recurred yesterday and woke the  patient up from sleeping tonight, so she presented to the emergency room.  The patient denies chest pain, shortness of breath, nausea, vomiting,  diaphoresis, dizziness, headache or syncope.  The patient recently had back  surgery on January 24, 2003 and the patient denies being on anticoagulation  during hospitalization.  She took Cipro for 16 days, the last dose being 2  days ago, for a wound infection.  She does have similar symptoms of  palpitation in the past that occur with hypoglycemic attacks over the past  few years that resolve with food.   PAST MEDICAL HISTORY:  1. L4 to L5 disk compression.  2. History of hypertension, on no medications now.  3. Obesity.   SURGICAL HISTORY:  1. Back surgery on January 24, 2003.  2. Hysterectomy in 2000.  3. Bilateral carpal tunnel release.  4. Tonsillectomy.  5. Lasik surgery bilaterally.   MEDICATIONS:  1. Motrin 200 mg three tabs b.i.d.  2. Patient previously on Prevacid p.r.n., Allegra daily, Singulair daily,     but has not taken any of these in 16  days.   ALLERGIES:  1. KEFLEX -- hives.  2. SULFA.  3. PENICILLIN -- rash.  4. AVELOX -- rash.   SOCIAL HISTORY:  The patient lives with husband they have been married for  31 years.  She has three children.  She is employed with [employer].  She denies tobacco or alcohol use.   FAMILY HISTORY:  Family history positive for MI in a grandfather who had six  MIs at unknown age, father with hypertension and diabetes, mother on  medicines for irregular heart beat.  Family history negative for cancer or  thyroid disease.   REVIEW OF SYSTEMS:  Review of systems positive for irritable bowel syndrome  with alternating constipation and diarrhea and negative for fever, chills,  weight change, night sweats, dysuria, rashes or bruises, headache, blurry  vision, joint pain or swelling.  PHYSICAL EXAMINATION:  VITAL SIGNS:  Temperature 97.5, respirations 18,  pulse 90 to 100 after 20 mg of IV Cardizem, blood pressure 105/81, oxygen  saturation 100% on room air.  GENERAL:  This is a well-developed, obese Caucasian female in no apparent  distress.  She is alert and oriented x3.  HEENT:  Normocephalic, atraumatic.  PERRLA.  EOMI.  Moist mucous membranes.  Oropharynx without erythema.  Nares without discharge.  NECK:  Neck supple.  Full range of motion.  No lymphadenopathy.  No  thyromegaly.  CARDIOVASCULAR:  Irregularly irregular, without murmurs, rubs, or gallops.  Nondisplaced PMI.  LUNGS:  Lungs clear to auscultation bilaterally with good effort.  No  rhonchi or crackles.  ABDOMEN:  Abdomen obese, soft, nontender and nondistended.  Normoactive  bowel sounds.  No hepatomegaly appreciated.  BACK:  No CVA tenderness.  Incision on lower back without erythema or  exudate or tenderness.  EXTREMITIES:  No clubbing, cyanosis, or edema.  Pedal pulses 2+ bilaterally.  NEUROLOGIC:  Cranial nerves II-XII grossly intact.  Nonfocal exam.  RECTAL:  Good tone, guaiac negative.   LABORATORY  DATA:  Sodium 132, potassium 3.6, chloride 107, bicarb 28, BUN  15, creatinine 0.8, glucose 113, calcium 9.3.  White blood count 7.7,  hemoglobin 12.4, hematocrit 37.1, platelets 243,000.  Total CK 95, CK-MB  1.8, troponin I 0.01.   Chest x-ray is negative for acute disease.   EKG initially showed atrial fibrillation with a rate of 142, then repeat EKG  shows normal sinus rhythm with no ischemic changes and normal Q-T interval.   ASSESSMENT AND PLAN:  [age]-year-old female with new-onset atrial  fibrillation.   Atrial fibrillation -- causes include electrolyte imbalance versus  hyperthyroidism versus iatrogenic versus cardiac structure abnormality  versus infection.  Basic metabolic panel was obtained and was within normal  limits as well as infection not likely with normal white count.  With the  patient's recent history of surgery, may need to rule out  emboli as cause.  Will obtain thyroid-stimulating hormone, urine drug  screen, cardiac enzymes, echocardiogram to rule out other causes.  Will rate-  control with Cardizem 60 mg q.i.d. and start aspirin, but may need long-term  Coumadin for anticoagulation.       Billey Gosling, M.D.                       Etta Grandchild, M.D.    AS/MEDQ  D:  02/19/2003  T:  02/21/2003  Job:  [id number]   cc:   Ernestina Penna, M.D.  [address] [place]  [place] [address]  Fax: [phone]

## 2011-02-28 NOTE — Op Note (Signed)
   Heather [name], Heather [name]                       ACCOUNT NO.:  [id number]   MEDICAL RECORD NO.:  [id number]                   PATIENT TYPE:  AMB   LOCATION:  ENDO                                 [facility]ACILITY:  [facility]   PHYSICIAN:  Barrie Folk, M.D.                  DATE OF BIRTH:  1951/08/09   DATE OF PROCEDURE:  06/24/2002  DATE OF DISCHARGE:                                 OPERATIVE REPORT   PROCEDURE:  Colonoscopy.   INDICATIONS FOR PROCEDURE:  Screening colonoscopy in a 60 year old patient.   DESCRIPTION OF PROCEDURE:  The patient was placed in the left lateral  decubitus position then placed on the pulse monitor with continuous low flow  oxygen delivered by nasal cannula. She was sedated with 90 mg IV Demerol and  9 mg IV Versed. The Olympus video colonoscope was inserted into the rectum  and advanced to the cecum, confirmed by transillumination at McBurney's  point and visualization of the ileocecal valve and appendiceal orifice. The  prep was excellent. The cecum, ascending, transverse, descending and sigmoid  colon all appeared normal with no masses, polyps, diverticula or other  mucosal abnormalities. The rectum likewise appeared normal and retroflexed  view of the anus revealed no obvious internal hemorrhoids. The colonoscope  was then withdrawn and the patient returned to the recovery room in stable  condition. The patient tolerated the procedure well and there were no  immediate complications.   IMPRESSION:  Essentially normal colonoscopy.   PLAN:  Flexible sigmoidoscopy in five years.                                                Barrie Folk, M.D.    JCH/MEDQ  D:  06/24/2002  T:  06/24/2002  Job:  [id number]

## 2011-02-28 NOTE — Cardiovascular Report (Signed)
NAME:  Heather [name], Heather [name]                        ACCOUNT NO.:  [id number]   MEDICAL RECORD NO.:  [id number]                   PATIENT TYPE:  INP   LOCATION:  4743                                 FACILITY:  [facility]   PHYSICIAN:  Madaline Savage, M.D.             DATE OF BIRTH:  [birth year]   DATE OF PROCEDURE:  02/23/2003  DATE OF DISCHARGE:                              CARDIAC CATHETERIZATION   PROCEDURES PERFORMED:  1. Selective coronary angiography by Judkins technique.  2. Retrograde left heart catheterization.  3. Left ventricular angiography.  4. Abdominal aortography.   COMPLICATIONS:  None.   ENTRY SITE:  Right femoral.   DYE USED:  Omnipaque.   PATIENT PROFILE:  Ms. Lamoreaux is a 60 year old white female with generally  good health who about three months ago had episode of tachypalpitation that  did not require medical attention.  Three days prior to hospital entry on  Feb 19, 2003, the patient had some tachypalpitations again and was noted to  be in atrial fibrillation at the time she came into the emergency room.  Workup thus far has shown no evidence of mitral valve disease other than  mitral prolapse, normal LV systolic function.  No other valve disease.  No  pericardial effusion.  Normal TSH.  Her Cardiolite stress test showed  evidence of anterior wall ischemia from apex to base and today she enters  the catheterization lab on an elective basis as an inpatient.   RESULTS:  PRESSURES:  Blood pressure was 130/60 and diastolic pressure 14.  Central aortic pressure 130/75, mean of 105.  No aortic valve gradient by  pullback technique.   ANGIOGRAPHIC RESULTS:  1. The left main coronary artery was normal.  2. The left anterior descending coronary artery and its diagonal branch are     both normal.  3. The left circumflex is nondominant and normal.  4. The right coronary artery is dominant giving rise to the posterolateral     and a posterior descending branch and one  acute marginal branch.  No     lesions are seen in the right coronary arterial tree.  5. Left ventricular angiogram in a 30 degree RAO projection shows normal LV     systolic function.  Ejection fraction 60% with no mitral regurgitation.  6. The abdominal aorta showed no evidence of abdominal pathology. The renal     arteries were normal.   FINAL DIAGNOSES:  1. Angiographically patent coronary arteries in a right coronary dominant     system.  2. Normal LV systolic function.  3. False-positive Cardiolite stress test.  4. Paroxysmal atrial fibrillation.   PLAN:  The patient will be started on flecainide for rhythm control.  Heparin will be converted to Coumadin due to the frequency of these episodes  and the patient will be followed after discharge for duration of Coumadin  therapy.  It may be short as six months or  maybe longer.                                                Madaline Savage, M.D.    WHG/MEDQ  D:  02/23/2003  T:  02/24/2003  Job:  [id number]

## 2011-02-28 NOTE — Op Note (Signed)
NAME:  Heather [name], Heather [name]                        ACCOUNT NO.:  [id number]   MEDICAL RECORD NO.:  [id number]                   PATIENT TYPE:  INP   LOCATION:  3314                                 FACILITY:  [facility]   PHYSICIAN:  Danae Orleans. Venetia Maxon, M.D.               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   POSTOPERATIVE DIAGNOSIS:  Herniated lumbar disk, L4-5 with  spondylolisthesis, degenerative disk disease and radiculopathy.   PROCEDURE:  1. Redo laminectomy L4-5 bilaterally.  2. Diskectomy L4-5 bilaterally.  3. Transverse lumbar interbody fusion L4-5 level (7-mm Synthes allograft).  4. Pedicle screw fixation L4 through L5 bilaterally.  5. Posterolateral arthrodesis with morselized allograft with Symphony     platelet rich concentrate and morselized bone autograft as well.   SURGEON:  Danae Orleans. Venetia Maxon, M.D.   ASSISTANT:  Payton Doughty, M.D.   ANESTHESIA:  General endotracheal anesthesia.   ESTIMATED BLOOD LOSS:  Minimal.   COMPLICATIONS:  None, disposition to the recovery room.   INDICATIONS FOR PROCEDURE:  [name] is a 60 year old woman who is  morbidly obese (greater than 300 pounds), who had decompressive laminotomies  at L4-5 for spinal stenosis. She subsequently developed spondylolisthesis  and a broad disk herniation which was causing significant nerve root  compression. It was elected to take her to surgery for decompression and  fusion at this affected level.   DESCRIPTION OF PROCEDURE:  Ms.  Boroff was brought to the operating room.  Following the satisfactory and uncomplicated induction of general  endotracheal anesthesia and placement  of intravenous lines, the patient was  placed in the prone position  on the operating table. Her low back was   then  prepped and draped in the usual sterile fashion.   An incision was made in the midline overlying the L4-5 interspace. This was  carried approximately  6 inches through adipose tissue to the lumbodorsal  fascia which was excised bilaterally. Subperiosteal dissection was performed  exposing the L4 through L5 transverse processes. This was quite difficult to  perform, owing to the patient's extremely large size. A 120-mm blade  retractor system was utilized, as this was the longest retractor available  and this barely exposed the patient's spine adequately.   Subsequently an interoperative x-ray was obtained but it was extremely  difficult to obtain this x-ray, and about 4 different attempts were made  with the portable x-ray machine with maximum exposures x4, but still it was  not possible to penetrate the patient's body with sufficient x-ray beam to  obtain visualization of the correct level.  Subsequently the  C-arm was draped and a very grainy image  was obtained  which did demonstrate the correct level  at the  L4-5 interspace.   Subsequently using loupe magnification the redo laminectomy was performed  bilaterally  with decompression of the L4 nerve roots and the  thecal sac as  it extended at this level. There was densely adherent scar tissue at both  sides and this took a considerable period of time to remove the scar tissue  and decompress the spinal nerve roots and  thecal sac.   A broad disk herniation was identified bilaterally at this level  and this  was incised with a #15 blade on each side. It was again difficult to perform  adequate decompression, as even the long instruments were not sufficiently  long to be able to instrument the disk  space adequately.   Subsequently a disk space spreader was placed and the endplates were  stripped  of residual disk material. A 7-mm T-lift bone graft was then  inserted and countersunk appropriately, and the outline of this graft   was  vaguely identified  on an interoperative fluoroscopic image. The morselized  autograft was then inserted overlying this graft  and countersunk  appropriately.   Subsequently 40 x 6.75-mm pedicle screws were placed, 2 at L4, 2 at L5. All  screws had excellent purchase and there were no cutouts appreciated. It was  not possible to use fluoroscopy, although it was attempted to do so because  of the patient's large body habitus.   Morselized bone autograft and allograft were then placed overlying the  decorticated transverse processes of L4 and L5 bilaterally. A 60-mm was cut  to two 30-mm rods and then this was inserted over the pedicle screws and  locked in situ. Prior to placing the bone graft the wound was copiously  irrigated with Bacitracin saline. The nerve roots were felt to be well  decompressed and the soft tissues and dura were inspected and all were in  good repair.   Subsequently the deep muscular layer was reapproximated with 1 Vicryl  suture. Multiple layers of fat were imbricated to prevent seroma from  forming, and the subcutaneous tissues were reapproximated with 2-0 Vicryl  interrupted inverted sutures and the skin edges were reapproximated with  interrupted 3-0 Vicryl subcuticular stitch. The wound was dressed with  Dermabond and a sterile occlusive dressing.   The patient tolerated the procedure well. She was taken to the recovery room  in stable, satisfactory condition. Counts were correct at the end of the  case.                                               Danae Orleans. Venetia Maxon, M.D.    JDS/MEDQ  D:  06/15/2003  T:  06/16/2003  Job:  [id number]

## 2011-02-28 NOTE — Op Note (Signed)
NAME:  Heather [name], HOOK                        ACCOUNT NO.:  [id number]   MEDICAL RECORD NO.:  [id number]                   PATIENT TYPE:  OIB   LOCATION:  NA                                   FACILITY:  [facility]   PHYSICIAN:  Danae Orleans. Venetia Maxon, M.D.               DATE OF BIRTH:  March 14, 1951   DATE OF PROCEDURE:  01/24/2003  DATE OF DISCHARGE:                                 OPERATIVE REPORT   PREOPERATIVE DIAGNOSES:  Lumbar spinal stenosis, lumbar spondylosis, lumbar  radiculopathy, morbid obesity, and degenerative disk disease.   POSTOPERATIVE DIAGNOSES:  Lumbar spinal stenosis, lumbar spondylosis, lumbar  radiculopathy, morbid obesity, and degenerative disk disease.   PROCEDURES:  1. Bilateral foraminotomies L4-L5.  2. Left L5-S1 foraminotomy.  3. Microdissection.   SURGEON:  Danae Orleans. Venetia Maxon, M.D.   ASSISTANT:  Hilda Lias, M.D.   ANESTHESIA:  General endotracheal anesthesia.   ESTIMATED BLOOD LOSS:  Minimal.   COMPLICATIONS:  Durotomy with primary repair.   DISPOSITION:  Recovery.   INDICATIONS FOR PROCEDURE:  This patient is a 60 year old woman who is  morbidly obese with lumbar spinal and foraminal stenosis at the L4-L5 level  with left L5-S1 foraminal stenosis as well.  It was elected, after she did  not improve with conservative measures, to take her to surgery for  decompression at these effected levels.   PROCEDURE:  The patient was brought to the operating room.  Following a  satisfactory and uncomplicated induction of general endotracheal anesthesia  and placement of intravenous lines, the patient was placed in a prone  position on a Wilson frame.  Her low back was then prepped and draped in the  usual sterile fashion.  The area of planned incision was infiltrated with  0.25% Marcaine and 0.5% lidocaine with 1:200,000 epinephrine.  Incision was  made overlying the L4 through sacral spinous processes and carried through  approximately 4 inches of adipose  tissue to the lumbodorsal fascia which was  then incised on the left side exposing the L4 through sacral laminae and on  the right side the L4-L5 interspace.  A self-retaining Versatrak retractor  with 90-mm blades was placed to facilitate exposure.  Intraoperative x-ray  confirmed marker probes at the L4-L5 and L5-S1 levels.  Subsequently, a hemi-  semilaminectomy of L4 and L5 was performed on the left and using  microdissection technique, the ligamentum flavum was removed, and the  lateral recesses decompressed at each of these levels.  This resulted in  significant decompression of the thecal sac and L4, L5, and S1 nerve roots.  Attention was then turned to the right side where a similar decompression  was performed.  On this level, there was a significant ridge of degenerated  facet joint and hypertrophied ligamentum flavum, and in the act of removing  this, a durotomy was created with resultant leakage of spinal fluid.  This  was then repaired under microdissection  technique using a 6-0 Prolene  stitch, and a Valsalva maneuver was performed after this repair was  performed without any evidence of residual spinal fluid leakage.  A Tisseal  patch was placed over this along with a piece of fat and then followed by  another layer of Tisseal.  The foraminotomies were felt to be completed.  The lumbodorsal fascia was then closed with 0 Vicryl sutures.  The  subcutaneous tissues were reapproximated with 0 and 2-0 Vicryl interrupted  inverted sutures, and the skin edge reapproximated with a running 3-0 nylon  stitch.  The wound was dressed with bacitracin, Telfa gauze, and tape.  A  Foley catheter was placed.  The patient was placed on flat bed rest.  She  tolerated the procedure well.  Counts were correct at the end of the case.                                               Danae Orleans. Venetia Maxon, M.D.    JDS/MEDQ  D:  01/24/2003  T:  01/24/2003  Job:  [id number]

## 2011-02-28 NOTE — Discharge Summary (Signed)
NAME:  Heather [name], Heather [name]                        ACCOUNT NO.:  [id number]   MEDICAL RECORD NO.:  [id number]                   PATIENT TYPE:  INP   LOCATION:  4743                                 FACILITY:  [facility]   PHYSICIAN:  Billey Gosling, M.D.                 DATE OF BIRTH:  Aug 07, 1951   DATE OF ADMISSION:  02/19/2003  DATE OF DISCHARGE:  02/27/2003                                 DISCHARGE SUMMARY   DISCHARGE DIAGNOSES:  1. Paroxysmal atrial fibrillation.  2. Mild mitral valve prolapse.  3. Obesity.  4. History of lumbar disk compression.   PROCEDURES:  1. Stress Cardiolite.  2. Cardiac catheterization.   CONSULTS:  Cardiology, Dr. Elsie Lincoln.   DISCHARGE MEDICATIONS:  1. Cardizem CD 180 mg every day.  2. Flecainide 100 mg b.i.d.  3. Coumadin 5 mg every day until told otherwise by your doctor.  4. Prevacid as previously directed.   DISPOSITION AND FOLLOWUP:  The patient is stable on the day of discharge,  and will return to Dr. Kathi Der office to have his flecainide level drawn  during the week of May 24 through 28th.  She will also see Dr. Christell Constant on  Tuesday, May 25, at 9:00 a.m.  Return to clinic tomorrow, May 18, to get her  INR drawn.  The patient will follow up with Dr. gamble on June 3 at noon.  The phone number is provided.   ADMISSION HISTORY:  This 60 year old white female with a history of  hypertension, but on no medications, presented to [facility] emergency  department with complaints of irregular heartbeat which began two days prior  to admission.  She had no symptoms of chest pain, shortness of breath,  nausea, vomiting, headache, or syncope.  She had recently had back surgery  on April 13, and was hospitalized for that.  On telemetry, the patient was  noted to have atrial fibrillation with rapid ventricular response, and was  admitted for further management.   HOSPITAL COURSE:  1. Paroxysmal atrial fibrillation - the patient was worked up for an  etiology for having this May onset of atrial fibrillation.  She was ruled     out for myocardial infarction by three sets of normal cardiac enzymes.     Her TSH level was normal.  She had an elevated d-dimer to 1.10.  A spiral     CT was done, which was negative and ruled out pulmonary embolus.     Cardiology was consulted.  The patient had a 2D echocardiogram which     revealed an ejection fraction of 60-65% and mitral valve prolapse, which     was mild and otherwise normal.  The patient then had a stress Cardiolite     test done which showed suspicion of anterior ischemia, apex to the base.     She then underwent a cardiac catheterization on May 13, which revealed  normal coronary artery and an ejection fraction of 60%.  The patient was     started on anticoagulation with Coumadin and heparin while in the     hospital, and will continue that as an outpatient every six months per     cardiology.  She was also started on flecainide.  At first, she had a     long Q-T, but she was stable on that prior to discharge with no symptoms.     The patient will follow up with Dr. gamble of cardiology for adjustment     of her medications.  She was very rate controlled on Cardizem with her     pulse in the 60s throughout hospitalization.  She will have her INR     checked at Dr. Kathi Der office where titration will be done.   LABORATORY DATA:  On May 17, WBC 7.7, hemoglobin 12.6, hematocrit 38.2,  platelets 221.  She was guaiac negative.  On May 17, PT was 21.7, INR 2.0,  PTT 97.  D-dimer on May 10 was 1.10.  BMP on admission was within normal  limits.  Troponin I times three was 0.01, TSH was 2.027.                                                Billey Gosling, M.D.    AS/MEDQ  D:  03/02/2003  T:  03/03/2003  Job:  [id number]   cc:   Madaline Savage, M.D.  [address], [address]  [place]  [place] [address]  Fax: [phone]   Ernestina Penna, M.D.  [address] [place]  [place] [address]   Fax: [phone]

## 2011-02-28 NOTE — Discharge Summary (Signed)
   NAME:  Heather [name], Heather [name]                        ACCOUNT NO.:  [id number]   MEDICAL RECORD NO.:  [id number]                   PATIENT TYPE:  INP   LOCATION:  3039                                 FACILITY:  [facility]   PHYSICIAN:  Clydene Fake, M.D.               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   PROCEDURE:  Decompressive laminectomy and fusion with instrumentation L4-5.   REASON FOR ADMISSION:  A 60 year old woman who has been having back and leg  pain and underwent MRI and myelogram showing spondylolisthesis L4-5,  unstable to flexion and extension.  The patient was admitted for  decompression and fusion.   HOSPITAL COURSE:  The patient was admitted the day of surgery and underwent  the above procedure without complications.  Postoperatively, the patient was  transferred from the recovery room to the floor on June 16, 2003.  Incision was dry.  PT was started to get her up, and cardiology consult was  also obtained to follow up with regards to her cardiac disease, which is  well-known to this service.  The patient continued to make progress with  therapy, had much less leg pain than prior to admission, was ambulating  well.  She continue to improve and on June 18, 2003, was discharged  home, in stable condition.   DISCHARGE MEDICATIONS:  Same as pre-hospitalization plus Flexeril p.r.n.  She can resume Coumadin at the usual dose and follow up with Dr. Venetia Maxon in 3  weeks.                                                Clydene Fake, M.D.    JRH/MEDQ  D:  06/29/2003  T:  07/01/2003  Job:  [id number]

## 2011-07-11 LAB — DIFFERENTIAL
Basophils Absolute: 0
Eosinophils Absolute: 0.1
Lymphocytes Relative: 8 — ABNORMAL LOW
Lymphs Abs: 0.6 — ABNORMAL LOW
Lymphs Abs: 1.3
Monocytes Relative: 6
Neutro Abs: 4.5
Neutro Abs: 7.1
Neutrophils Relative %: 72
Neutrophils Relative %: 89 — ABNORMAL HIGH

## 2011-07-11 LAB — BASIC METABOLIC PANEL
Chloride: 108
Creatinine, Ser: 0.75
GFR calc Af Amer: >60

## 2011-07-11 LAB — CBC
MCV: 82.9
Platelets: 224
RBC: 4.5
RBC: 4.54
RDW: 14.2
WBC: 6.4

## 2011-08-05 ENCOUNTER — Other Ambulatory Visit: Payer: Self-pay | Admitting: Cardiology

## 2011-08-05 MED ORDER — LISINOPRIL 20 MG PO TABS: 20.0000 mg | ORAL_TABLET | Freq: Every day | ORAL | Status: DC

## 2012-02-05 ENCOUNTER — Telehealth: Payer: Self-pay | Admitting: Cardiology

## 2012-02-05 NOTE — Telephone Encounter (Signed)
New Problem:     I called the patient and was unable to reach them. I left a message on their voicemail with my name, the reason I called, the name of his physician, and a number to call back to schedule their appointment. 

## 2012-02-20 ENCOUNTER — Encounter: Payer: Self-pay | Admitting: Cardiology

## 2012-02-20 ENCOUNTER — Ambulatory Visit (INDEPENDENT_AMBULATORY_CARE_PROVIDER_SITE_OTHER): Payer: BC Managed Care – PPO | Admitting: Cardiology

## 2012-02-20 VITALS — BP 114/82 | HR 96 | Ht 67.0 in | Wt 284.8 lb

## 2012-02-20 DIAGNOSIS — I4891 Unspecified atrial fibrillation: Secondary | ICD-10-CM

## 2012-02-20 LAB — BASIC METABOLIC PANEL
BUN: 21 mg/dL (ref 6–23)
CO2: 28 mEq/L (ref 19–32)
Chloride: 103 mEq/L (ref 96–112)
Creatinine, Ser: 1 mg/dL (ref 0.4–1.2)
Glucose, Bld: 92 mg/dL (ref 70–99)
Potassium: 3.9 mEq/L (ref 3.5–5.1)

## 2012-02-20 LAB — CBC WITH DIFFERENTIAL/PLATELET
Eosinophils Absolute: 0.3 10*3/uL (ref 0.0–0.7)
HCT: 37.9 % (ref 36.0–46.0)
Lymphs Abs: 1.9 10*3/uL (ref 0.7–4.0)
MCHC: 32.5 g/dL (ref 30.0–36.0)
MCV: 83.9 fl (ref 78.0–100.0)
Monocytes Absolute: 0.5 10*3/uL (ref 0.1–1.0)
Neutrophils Relative %: 63.7 % (ref 43.0–77.0)
Platelets: 207 10*3/uL (ref 150.0–400.0)

## 2012-02-20 MED ORDER — METOPROLOL SUCCINATE ER 25 MG PO TB24: 25.0000 mg | ORAL_TABLET | Freq: Every day | ORAL | Status: DC

## 2012-02-20 MED ORDER — APIXABAN 5 MG PO TABS: 5.0000 mg | ORAL_TABLET | Freq: Two times a day (BID) | ORAL | Status: DC

## 2012-02-20 NOTE — Patient Instructions (Signed)
Start metoprolol (Toprol XL) 25mg  daily.  Start Eliquis(Apixaban) 5mg  twice a day.  Your physician recommends that you have lab work today--BMET/CBC 427.31  Your physician recommends that you schedule a follow-up appointment in: 1 month with Dr Shirlee Latch.  Schedule an appointment with the pharmacist in the [facility] clinic in 1 month.

## 2012-02-22 NOTE — Progress Notes (Signed)
PCP: Dr. Christell Constant  61 yo with history of morbid obesity s/p lap band surgery and atrial fibrillation s/p ablation presents for cardiology followup.  Patient developed atrial fibrillation in 2004.  She had a cath in 2004 showing no significant CAD.  She had atrial fibrillation ablation in 2005 with a redo in 2007.  She has had no documented atrial fibrillation since 2007 prior to today and is not taking coumadin any longer.  Today, Heather [name] was noted to be in coarse atrial fibrillation with HR in the 90s.  She did not realize she was in atrial fibrillation.  She is fatigued in general but does not report a recent increase in exertional dyspnea.  She walks her driveway for exercise and is short of breath after 6 laps.  She is short of breath with steps.  No chest pain.    ECG: Coarse atrial fibrillation at 96  Labs (3/11): BNP 65, K 4.2, creatinine 0.83, TSH normal, HCT 39.1  Allergies (verified):  1)  ! Keflex 2)  ! Sulfa 3)  ! Pcn 4)  ! * Avelox 5)  ! Flecainide Acetate  Past Medical History: 1. L4/ L5 disk disease, s/p surgery 2. hypertension 3. Obesity: s/p Lap band surgery 7/09 4. Atrial fibrillation: Began in 2004.  Had ablations at [facility] in 2005 and 2007.  Unable to tolerate flecainide.  Back in atrial fibrillation in 5/13.  5. Left heart cath (2004): No significant CAD, false + cardiolite. EF 60%.  6. Echo (3/11): LV EF 55-60%, no significant valvular dysfunction, RV-RA gradient 26 mmHg.  7. Hypothyroidism 8. Right hip replacement 10/10, left hip replacement 10/11 9. GERD: Resolved after lap band  Family History: Grandfather with 6 heart attacks, she is not sure what age he first had a heart attack.  Father with hypertension and diabetes, mother on medicines for irregular heart beat.   Family history negative for cancer or thyroid disease.  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  Alcohol Use - no  ROS: All  systems reviewed and negative except as per HPI.   Current Outpatient Prescriptions  Medication Sig Dispense Refill  . amitriptyline (ELAVIL) 10 MG tablet Take 10 mg by mouth at bedtime.      Marland Kitchen aspirin 325 MG tablet Take 325 mg by mouth daily.        . Cholecalciferol (VITAMIN D-3) 5000 UNITS TABS Take 5,000 mg by mouth. Take 5000 mg daily Monday - Friday.       Marland Kitchen lisinopril (PRINIVIL,ZESTRIL) 20 MG tablet Take 1 tablet (20 mg total) by mouth daily.  30 tablet  5  . apixaban (ELIQUIS) 5 MG TABS tablet Take 1 tablet (5 mg total) by mouth 2 (two) times daily.  60 tablet  1  . metoprolol succinate (TOPROL XL) 25 MG 24 hr tablet Take 1 tablet (25 mg total) by mouth daily.  30 tablet  6    BP 114/82  Pulse 96  Ht 5\' 7"  (1.702 m)  Wt 284 lb 12.8 oz (129.184 kg)  BMI 44.61 kg/m2 General:  Well developed, well nourished, in no acute distress. Obese.  Neck:  Neck supple,JVP 7-8 cm. No masses, thyromegaly or abnormal cervical nodes. Lungs:  Clear bilaterally to auscultation and percussion. Heart:  Non-displaced PMI, chest non-tender; irregular rate and rhythm, S1, S2 without murmurs, rubs or gallops. Carotid upstroke normal, no bruit. Pedals normal pulses. No edema, no varicosities. Abdomen:  Bowel  sounds positive; abdomen soft and non-tender without masses, organomegaly, or hernias noted. No hepatosplenomegaly. Obese.  Extremities:  No clubbing or cyanosis. Neurologic:  Alert and oriented x 3. Psych:  Normal affect.

## 2012-02-22 NOTE — Assessment & Plan Note (Signed)
Patient is back in atrial fibrillation.  She did not note palpitations.  CHADSVASC score is 2 (gender, HTN) so she warrants anticoagulation.  I will have her start apixaban and stop aspirin.  I will also begin her on Toprol XL 25 mg daily for some rate control.  I will see her back in a month.  If she is still in atrial fibrillation, I will talk to her about admission to the hospital for dofetilide load with DCCV if she does not convert to NSR with dofetilide alone.  Check BMET and CBC today.

## 2012-03-01 DIAGNOSIS — R6882 Decreased libido: Secondary | ICD-10-CM | POA: Insufficient documentation

## 2012-03-01 DIAGNOSIS — G5600 Carpal tunnel syndrome, unspecified upper limb: Secondary | ICD-10-CM | POA: Insufficient documentation

## 2012-03-01 DIAGNOSIS — Z8679 Personal history of other diseases of the circulatory system: Secondary | ICD-10-CM | POA: Insufficient documentation

## 2012-03-09 ENCOUNTER — Ambulatory Visit: Payer: Self-pay | Admitting: Obstetrics and Gynecology

## 2012-03-24 ENCOUNTER — Encounter: Payer: Self-pay | Admitting: Cardiology

## 2012-03-24 ENCOUNTER — Ambulatory Visit (INDEPENDENT_AMBULATORY_CARE_PROVIDER_SITE_OTHER): Payer: BC Managed Care – PPO | Admitting: Cardiology

## 2012-03-24 ENCOUNTER — Ambulatory Visit (INDEPENDENT_AMBULATORY_CARE_PROVIDER_SITE_OTHER): Payer: BC Managed Care – PPO | Admitting: Pharmacist

## 2012-03-24 VITALS — BP 114/86 | HR 91 | Ht 67.0 in | Wt 285.0 lb

## 2012-03-24 DIAGNOSIS — I480 Paroxysmal atrial fibrillation: Secondary | ICD-10-CM | POA: Insufficient documentation

## 2012-03-24 DIAGNOSIS — I4891 Unspecified atrial fibrillation: Secondary | ICD-10-CM

## 2012-03-24 DIAGNOSIS — Z8679 Personal history of other diseases of the circulatory system: Secondary | ICD-10-CM

## 2012-03-24 MED ORDER — DILTIAZEM HCL ER COATED BEADS 120 MG PO CP24: 120.0000 mg | ORAL_CAPSULE | Freq: Every day | ORAL | Status: DC

## 2012-03-24 NOTE — Progress Notes (Signed)
Dicussed recent conversion to apixaban with the patient.  Reviewed mechanism, side effects, dosing instructions and potential risks.  She expressed acceptance and verbalized understanding.  Will schedule her to return in 6 months for labwork follow up.

## 2012-03-24 NOTE — Assessment & Plan Note (Signed)
Heather [name] remains in atrial fibrillation today.  It appears persistent at this point.  She feels better in NSR.  Her fatigue has considerably worsened; she is not sure if this is due to the atrial fibrillation itself or to the metoprolol she started on.  She has now been on apixaban for about a month without missing doses.  - I will have her stop Toprol XL and take diltiazem CD 120 mg daily for rate control instead.  Will see if this helps her fatigue.   - Echo to make sure that no new cardiac structural abnormalities have developed.   - She is going for a sleep study => OSA is a significant risk for atrial fibrillation.  - I will admit her for dofetilide initiation.  This will take 3 days.  If she remains in atrial fibrillation on day 3, I will arrange to have her cardioverted.  She will be seen in our pharmacy clinic for pre-dofetilide labs.

## 2012-03-24 NOTE — Patient Instructions (Addendum)
Stop metoprolol (toprol).  Start Cardizem CD 120mg  daily.  Your physician has requested that you have an echocardiogram. Echocardiography is a painless test that uses sound waves to create images of your heart. It provides your doctor with information about the size and shape of your heart and how well your heart's chambers and valves are working. This procedure takes approximately one hour. There are no restrictions for this procedure.  Weston Brass, pharmacist will call you about arrangements to go in to the hospital for Dofetilide(Tikosyn).   Your physician recommends that you schedule a follow-up appointment with Dr Shirlee Latch 2 months after you start Tikosyn.

## 2012-03-24 NOTE — Progress Notes (Signed)
Patient ID: Heather [name], female   DOB: [birth year], 61 y.o.   MRN: [id number] PCP: Dr. Tanya Nones  61 yo with history of morbid obesity s/p lap band surgery and atrial fibrillation s/p ablation presents for cardiology followup.  Patient developed atrial fibrillation in 2004.  She had a cath in 2004 showing no significant CAD.  She had atrial fibrillation ablation in 2005 with a redo in 2007.  She has had no documented atrial fibrillation since 2007 prior to last appointment.  At last appointment, Heather [name] was noted to be in coarse atrial fibrillation with HR in the 90s.  She did not realize she was in atrial fibrillation.  She is fatigued in general but this has been chronic.  She walks her driveway for exercise and is short of breath after 6 laps.  She is short of breath with steps.  No chest pain.  At last appointment, I started her on apixaban and Toprol XL 25 mg daily.    Today, she remains in atrial fibrillation.  She feels palpitations off and on.  She says that her fatigue has gotten worse over the last month; she is not sure if this is due to Toprol or to atrial fibrillation.  She thinks that she feels better in NSR.  She denies exertional dyspnea with normal daily activities.    ECG: Coarse atrial fibrillation at 85  Labs (3/11): BNP 65, K 4.2, creatinine 0.83, TSH normal, HCT 39.1 Labs (5/13): K 3.9, creaitnine 1.0, HCT 37.9  Allergies (verified):  1)  ! Keflex 2)  ! Sulfa 3)  ! Pcn 4)  ! * Avelox 5)  ! Flecainide Acetate  Past Medical History: 1. L4/ L5 disk disease, s/p surgery 2. hypertension 3. Obesity: s/p Lap band surgery 7/09 4. Atrial fibrillation: Began in 2004.  Had ablations at [facility] in 2005 and 2007.  Unable to tolerate flecainide.  Back in atrial fibrillation in 5/13.  5. Left heart cath (2004): No significant CAD, false + cardiolite. EF 60%.  6. Echo (3/11): LV EF 55-60%, no significant valvular dysfunction, RV-RA gradient 26 mmHg.  7. Hypothyroidism 8. Right  hip replacement 10/10, left hip replacement 10/11 9. GERD: Resolved after lap band  Family History: Grandfather with 6 heart attacks, she is not sure what age he first had a heart attack.  Father with hypertension and diabetes, mother on medicines for irregular heart beat.   Family history negative for cancer or thyroid disease.  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  Alcohol Use - no  ROS: All systems reviewed and negative except as per HPI.   Current Outpatient Prescriptions  Medication Sig Dispense Refill  . amitriptyline (ELAVIL) 10 MG tablet Take 10 mg by mouth at bedtime.      Marland Kitchen apixaban (ELIQUIS) 5 MG TABS tablet Take 1 tablet (5 mg total) by mouth 2 (two) times daily.  60 tablet  1  . Cholecalciferol (VITAMIN D-3) 5000 UNITS TABS Take 5,000 mg by mouth. Take 5000 mg daily Monday - Friday.       Marland Kitchen lisinopril (PRINIVIL,ZESTRIL) 20 MG tablet Take 1 tablet (20 mg total) by mouth daily.  30 tablet  5  . vitamin C (ASCORBIC ACID) 500 MG tablet Take 500 mg by mouth daily.      Marland Kitchen diltiazem (CARDIZEM CD) 120 MG 24 hr capsule Take 1 capsule (120 mg total) by mouth daily.  30 capsule  6  BP 114/86  Pulse 91  Ht 5\' 7"  (1.702 m)  Wt 129.275 kg (285 lb)  BMI 44.64 kg/m2  SpO2 97% General:  Well developed, well nourished, in no acute distress. Obese.  Neck:  Neck supple, JVP 8 cm. No masses, thyromegaly or abnormal cervical nodes. Lungs:  Clear bilaterally to auscultation and percussion. Heart:  Non-displaced PMI, chest non-tender; irregular rate and rhythm, S1, S2 without murmurs, rubs or gallops. Carotid upstroke normal, no bruit. Pedals normal pulses. No edema, no varicosities. Abdomen:  Bowel sounds positive; abdomen soft and non-tender without masses, organomegaly, or hernias noted. No hepatosplenomegaly. Obese.  Extremities:  No clubbing or cyanosis. Neurologic:  Alert and oriented x 3. Psych:  Normal affect.

## 2012-03-26 ENCOUNTER — Ambulatory Visit (HOSPITAL_COMMUNITY): Payer: BC Managed Care – PPO | Attending: Cardiology

## 2012-03-26 DIAGNOSIS — R5381 Other malaise: Secondary | ICD-10-CM | POA: Insufficient documentation

## 2012-03-26 DIAGNOSIS — R002 Palpitations: Secondary | ICD-10-CM | POA: Insufficient documentation

## 2012-03-26 DIAGNOSIS — R0609 Other forms of dyspnea: Secondary | ICD-10-CM | POA: Insufficient documentation

## 2012-03-26 DIAGNOSIS — I059 Rheumatic mitral valve disease, unspecified: Secondary | ICD-10-CM | POA: Insufficient documentation

## 2012-03-26 DIAGNOSIS — I517 Cardiomegaly: Secondary | ICD-10-CM | POA: Insufficient documentation

## 2012-03-26 DIAGNOSIS — I4891 Unspecified atrial fibrillation: Secondary | ICD-10-CM

## 2012-03-26 DIAGNOSIS — I10 Essential (primary) hypertension: Secondary | ICD-10-CM | POA: Insufficient documentation

## 2012-03-26 DIAGNOSIS — R5383 Other fatigue: Secondary | ICD-10-CM | POA: Insufficient documentation

## 2012-03-26 DIAGNOSIS — R0989 Other specified symptoms and signs involving the circulatory and respiratory systems: Secondary | ICD-10-CM | POA: Insufficient documentation

## 2012-03-26 NOTE — Progress Notes (Signed)
Echocardiogram performed.  

## 2012-04-01 ENCOUNTER — Encounter (HOSPITAL_COMMUNITY): Payer: Self-pay | Admitting: General Practice

## 2012-04-01 ENCOUNTER — Observation Stay (HOSPITAL_COMMUNITY)
Admission: AD | Admit: 2012-04-01 | Discharge: 2012-04-02 | DRG: 139 | Disposition: A | Discharge status: Home / Self Care | Payer: BC Managed Care – PPO | Source: Ambulatory Visit | Attending: Cardiology | Admitting: Cardiology

## 2012-04-01 ENCOUNTER — Ambulatory Visit (INDEPENDENT_AMBULATORY_CARE_PROVIDER_SITE_OTHER): Payer: BC Managed Care – PPO | Admitting: Pharmacist

## 2012-04-01 VITALS — BP 126/82 | HR 88 | Ht 66.0 in | Wt 281.0 lb

## 2012-04-01 DIAGNOSIS — G4733 Obstructive sleep apnea (adult) (pediatric): Secondary | ICD-10-CM

## 2012-04-01 DIAGNOSIS — I4891 Unspecified atrial fibrillation: Secondary | ICD-10-CM

## 2012-04-01 DIAGNOSIS — G4730 Sleep apnea, unspecified: Secondary | ICD-10-CM | POA: Insufficient documentation

## 2012-04-01 DIAGNOSIS — K219 Gastro-esophageal reflux disease without esophagitis: Secondary | ICD-10-CM | POA: Insufficient documentation

## 2012-04-01 DIAGNOSIS — E669 Obesity, unspecified: Secondary | ICD-10-CM | POA: Insufficient documentation

## 2012-04-01 DIAGNOSIS — R0602 Shortness of breath: Secondary | ICD-10-CM | POA: Diagnosis present

## 2012-04-01 DIAGNOSIS — I480 Paroxysmal atrial fibrillation: Secondary | ICD-10-CM | POA: Diagnosis present

## 2012-04-01 DIAGNOSIS — E039 Hypothyroidism, unspecified: Secondary | ICD-10-CM | POA: Insufficient documentation

## 2012-04-01 DIAGNOSIS — Z7901 Long term (current) use of anticoagulants: Secondary | ICD-10-CM

## 2012-04-01 DIAGNOSIS — I10 Essential (primary) hypertension: Secondary | ICD-10-CM | POA: Insufficient documentation

## 2012-04-01 HISTORY — DX: Personal history of other medical treatment: Z92.89

## 2012-04-01 HISTORY — DX: Sleep apnea, unspecified: G47.30

## 2012-04-01 HISTORY — DX: Anemia, unspecified: D64.9

## 2012-04-01 LAB — BASIC METABOLIC PANEL
BUN: 18 mg/dL (ref 6–23)
Calcium: 9.7 mg/dL (ref 8.4–10.5)
Glucose, Bld: 89 mg/dL (ref 70–99)
Potassium: 4 mEq/L (ref 3.5–5.3)
Sodium: 141 mEq/L (ref 135–145)

## 2012-04-01 MED ORDER — SODIUM CHLORIDE 0.9 % IJ SOLN
3.0000 mL | Freq: Two times a day (BID) | INTRAMUSCULAR | Status: DC
  Administered 2012-04-01: 3 mL via INTRAVENOUS

## 2012-04-01 MED ORDER — SODIUM CHLORIDE 0.9 % IJ SOLN
3.0000 mL | Freq: Two times a day (BID) | INTRAMUSCULAR | Status: DC
  Administered 2012-04-02: 3 mL via INTRAVENOUS

## 2012-04-01 MED ORDER — VITAMIN D3 25 MCG (1000 UNIT) PO TABS
5000.0000 [IU] | ORAL_TABLET | ORAL | Status: DC
  Administered 2012-04-02: 5000 [IU] via ORAL
  Filled 2012-04-01 (×2): qty 5

## 2012-04-01 MED ORDER — APIXABAN 5 MG PO TABS
5.0000 mg | ORAL_TABLET | Freq: Two times a day (BID) | ORAL | Status: DC
Start: 2012-04-01 — End: 2012-04-02
  Administered 2012-04-01 – 2012-04-02 (×2): 5 mg via ORAL
  Filled 2012-04-01 (×3): qty 1

## 2012-04-01 MED ORDER — ONDANSETRON HCL 4 MG/2ML IJ SOLN: 4.0000 mg | Freq: Four times a day (QID) | INTRAMUSCULAR | Status: DC | PRN

## 2012-04-01 MED ORDER — HYDROCORTISONE 1 % EX CREA: 1.0000 "application " | TOPICAL_CREAM | Freq: Three times a day (TID) | CUTANEOUS | Status: DC | PRN

## 2012-04-01 MED ORDER — DOFETILIDE 500 MCG PO CAPS
500.0000 ug | ORAL_CAPSULE | Freq: Two times a day (BID) | ORAL | Status: DC
  Filled 2012-04-01: qty 1

## 2012-04-01 MED ORDER — SODIUM CHLORIDE 0.9 % IV SOLN: 250.0000 mL | INTRAVENOUS | Status: DC | PRN

## 2012-04-01 MED ORDER — VITAMIN D-3 125 MCG (5000 UT) PO TABS: 5000.0000 [IU] | ORAL_TABLET | Freq: Every day | ORAL | Status: DC

## 2012-04-01 MED ORDER — ALPRAZOLAM 0.25 MG PO TABS: 0.2500 mg | ORAL_TABLET | Freq: Two times a day (BID) | ORAL | Status: DC | PRN

## 2012-04-01 MED ORDER — SODIUM CHLORIDE 0.9 % IV SOLN
250.0000 mL | INTRAVENOUS | Status: DC
  Administered 2012-04-02: 250 mL via INTRAVENOUS

## 2012-04-01 MED ORDER — SODIUM CHLORIDE 0.9 % IJ SOLN: 3.0000 mL | INTRAMUSCULAR | Status: DC | PRN

## 2012-04-01 MED ORDER — AMITRIPTYLINE HCL 10 MG PO TABS
10.0000 mg | ORAL_TABLET | Freq: Every day | ORAL | Status: DC
  Administered 2012-04-01: 10 mg via ORAL
  Filled 2012-04-01 (×2): qty 1

## 2012-04-01 MED ORDER — DILTIAZEM HCL ER COATED BEADS 120 MG PO CP24
120.0000 mg | ORAL_CAPSULE | Freq: Every day | ORAL | Status: DC
  Administered 2012-04-02: 120 mg via ORAL
  Filled 2012-04-01 (×2): qty 1

## 2012-04-01 MED ORDER — LISINOPRIL 20 MG PO TABS
20.0000 mg | ORAL_TABLET | Freq: Every day | ORAL | Status: DC
  Administered 2012-04-02: 20 mg via ORAL
  Filled 2012-04-01 (×2): qty 1

## 2012-04-01 MED ORDER — NITROGLYCERIN 0.4 MG SL SUBL: 0.4000 mg | SUBLINGUAL_TABLET | SUBLINGUAL | Status: DC | PRN

## 2012-04-01 MED ORDER — ACETAMINOPHEN 325 MG PO TABS: 650.0000 mg | ORAL_TABLET | ORAL | Status: DC | PRN

## 2012-04-01 MED ORDER — LORATADINE 10 MG PO TABS
10.0000 mg | ORAL_TABLET | Freq: Every day | ORAL | Status: DC
  Administered 2012-04-01 – 2012-04-02 (×2): 10 mg via ORAL
  Filled 2012-04-01 (×2): qty 1

## 2012-04-01 MED ORDER — VITAMIN C 500 MG PO TABS
500.0000 mg | ORAL_TABLET | Freq: Every day | ORAL | Status: DC
  Administered 2012-04-01 – 2012-04-02 (×2): 500 mg via ORAL
  Filled 2012-04-01 (×2): qty 1

## 2012-04-01 MED ORDER — PROPAFENONE HCL ER 225 MG PO CP12
225.0000 mg | ORAL_CAPSULE | Freq: Two times a day (BID) | ORAL | Status: DC
  Administered 2012-04-01 – 2012-04-02 (×2): 225 mg via ORAL
  Filled 2012-04-01 (×5): qty 1

## 2012-04-01 MED ORDER — ZOLPIDEM TARTRATE 5 MG PO TABS: 5.0000 mg | ORAL_TABLET | Freq: Every evening | ORAL | Status: DC | PRN

## 2012-04-01 NOTE — Progress Notes (Signed)
Patient ID: Heather [name], female   DOB: 04-18-1951, 61 y.o.   MRN: [id number]  61 yo with history of atrial fibrillation s/p ablation presents for Tikosyn initiation.  Patient developed atrial fibrillation in 2004.  She had atrial fibrillation ablation in 2005 with a redo in 2007.  She has had no documented atrial fibrillation since 2007 but reverted back to atrial fibrillation earlier this year.  She was placed on metoprolol and apixaban in May.  At her recent follow up visit with Dr. Shirlee Latch, she complained of increased fatigue.  Her metoprolol was switched to diltiazem and it was decided to pursue rhythm control.  She had tried amiodarone in the past, but has not been on this for >1 year.  It was decided to start Tikosyn at that time.   Reviewed Tikosyn with patient.  She is aware of common side effects and importance of compliance.  Reviewed anticoagulation history.  Pt has been on Eliquis >1 month and does not report any missed doses.  Reviewed medication list.  She is currently not taking any QTc prolongating medications or contraindicated medications.  She has not taken any other antiarrhythmic therapies recently.  I have called pt's insurance company.  Tikosyn is covered and will cost $50 for 90 day supply through mail order.   ECG reviewed by Dr. Shirlee Latch: Aflutter.  Qtc- , HR- 88   Current Outpatient Prescriptions  Medication Sig Dispense Refill  . amitriptyline (ELAVIL) 10 MG tablet Take 10 mg by mouth at bedtime.      Marland Kitchen apixaban (ELIQUIS) 5 MG TABS tablet Take 1 tablet (5 mg total) by mouth 2 (two) times daily.  60 tablet  1  . Cholecalciferol (VITAMIN D-3) 5000 UNITS TABS Take 5000 mg daily Monday - Friday.      . diltiazem (CARDIZEM CD) 120 MG 24 hr capsule Take 1 capsule (120 mg total) by mouth daily.  30 capsule  6  . fexofenadine (ALLEGRA) 180 MG tablet Take 180 mg by mouth daily.      Marland Kitchen lisinopril (PRINIVIL,ZESTRIL) 20 MG tablet Take 1 tablet (20 mg total) by mouth daily.  30  tablet  5  . vitamin C (ASCORBIC ACID) 500 MG tablet Take 500 mg by mouth daily.        Allergies  Allergen Reactions  . Rocephin (Ceftriaxone Sodium In Dextrose) Anaphylaxis  . Cephalexin Hives  . Flecainide Acetate   . Moxifloxacin     Does not remember the reaction  . Penicillins Hives  . Sulfonamide Derivatives     Does not remember reaction

## 2012-04-01 NOTE — Assessment & Plan Note (Addendum)
Reviewed pt's labwork and EKG.  K and Mg are appropriate at 4.0 and 1.9 respectively.  She has been adequately anticoagulated with Eliquis for >4 weeks.  SCr- 0.8.  CrCl >100 mL/min.  QTc acceptable at .  Will proceed with Tikosyn BID.  Pt is aware to report to the hospital.  She will be admitted to 3700.

## 2012-04-01 NOTE — Progress Notes (Signed)
   Patient Name: Heather [name] [facility] Date of Encounter: 04/01/2012  Principal Problem:  *Atrial fibrillation Active Problems:  HYPERTENSION, UNSPECIFIED  Shortness of breath  Chronic anticoagulation   SUBJECTIVE: Still with palpitations and DOE, fatigue. Had sleep study this week, dx severe OSA but has not started on CPAP yet.  OBJECTIVE Filed Vitals:   04/01/12 1559  BP: 115/78  Pulse: 88  Temp: 98.3 F (36.8 C)  TempSrc: Oral  Resp: 20  Height: 5\' 6"  (1.676 m)  Weight: 280 lb 6.8 oz (127.2 kg)  SpO2: 98%    Intake/Output Summary (Last 24 hours) at 04/01/12 1724 Last data filed at 04/01/12 1559  Gross per 24 hour  Intake      0 ml  Output      1 ml  Net     -1 ml   Weight change:  Filed Weights   04/01/12 1559  Weight: 280 lb 6.8 oz (127.2 kg)     PHYSICAL EXAM General: Well developed, obese female, in no acute distress. Head: Normocephalic, atraumatic.  Neck: Supple without bruits, JVD minimally elevated. Lungs:  Resp regular and unlabored, few rales, no crackles.. Heart: Irregular, S1, S2, no S3, S4, or murmur. Abdomen: Soft, non-tender, non-distended, BS + x 4.  Extremities: No clubbing, cyanosis, no edema.  Neuro: Alert and oriented X 3. Moves all extremities spontaneously. Psych: Normal affect.  LABS: Basic Metabolic Panel: Basename 04/01/12 0926  NA 141  K 4.0  CL 103  CO2 28  GLUCOSE 89  BUN 18  CREATININE 0.80  CALCIUM 9.7  MG 1.9  PHOS --    TELE:  Atrial fib, RVR at times.    ECG: per DM, QTc OK.  Current Medications: TBA  ASSESSMENT AND PLAN: Principal Problem:  *Atrial fibrillation -   QTc >460 msec  Will not be candidate for tikosyn  Have reviewed with Dr DM and will begin propafenone 225 mg bid Anticvipate DCCV in am and discharge tehreafter Otherwise, continue home meds including apixaban and use CPAP per resp while in-hosp. If we can get a copy of the study, may be able to order her home machine.  Active Problems:  HYPERTENSION, UNSPECIFIED  Shortness of breath  Chronic anticoagulation  OSA (obstructive sleep apnea)  Signed, Theodore Demark , PA-C 5:24 PM 04/01/2012.stve  addended above NO tikosyn Use propafenone DCCV in am outpt stress > would  Avoid myoview as false positive in past  ? MRI-stress Reviewed extensively with family in addition to the potentially contributing factor of OSA and option of repeat RFCA  Sherryl Manges, MD 04/01/2012 6:16 PM

## 2012-04-01 NOTE — Progress Notes (Addendum)
Pharmacy Monitoring for Dofetilide (Tikosyn) Iniation  60yof with Afib admitted for Tikosyn initiation. Patient is anticoagulated with Apixaban. -  CrCl >100 ml/min - K: 4 mg/dl - Mg: 1.9 mg/dl - QTc: >161  MD will not initiate Tikosyn. Propafenone 225mg  q12h has been ordered with plans for DCCV.  Cleon Dew, PharmD [phone] 04/01/2012 6:17 PM

## 2012-04-02 ENCOUNTER — Encounter (HOSPITAL_COMMUNITY)
Admission: AD | Disposition: A | Discharge status: Home / Self Care | Payer: Self-pay | Source: Ambulatory Visit | Attending: Cardiology

## 2012-04-02 ENCOUNTER — Encounter (HOSPITAL_COMMUNITY): Payer: Self-pay | Admitting: Anesthesiology

## 2012-04-02 ENCOUNTER — Encounter (HOSPITAL_COMMUNITY): Payer: Self-pay | Admitting: *Deleted

## 2012-04-02 ENCOUNTER — Inpatient Hospital Stay (HOSPITAL_COMMUNITY): Payer: BC Managed Care – PPO | Admitting: *Deleted

## 2012-04-02 HISTORY — PX: CARDIOVERSION: SHX1299

## 2012-04-02 LAB — BASIC METABOLIC PANEL
Calcium: 9.3 mg/dL (ref 8.4–10.5)
Chloride: 105 mEq/L (ref 96–112)
Creatinine, Ser: 0.85 mg/dL (ref 0.50–1.10)
GFR calc Af Amer: 85 mL/min — ABNORMAL LOW (ref >90)
Sodium: 142 mEq/L (ref 135–145)

## 2012-04-02 LAB — MAGNESIUM: Magnesium: 1.9 mg/dL (ref 1.5–2.5)

## 2012-04-02 SURGERY — CARDIOVERSION
Anesthesia: Monitor Anesthesia Care | Wound class: Clean

## 2012-04-02 SURGERY — CARDIOVERSION
Anesthesia: Monitor Anesthesia Care

## 2012-04-02 MED ORDER — LIDOCAINE HCL (CARDIAC) 20 MG/ML IV SOLN
INTRAVENOUS | Status: DC | PRN
  Administered 2012-04-02: 50 mg via INTRAVENOUS

## 2012-04-02 MED ORDER — PROPOFOL 10 MG/ML IV BOLUS
INTRAVENOUS | Status: DC | PRN
  Administered 2012-04-02: 70 mg via INTRAVENOUS

## 2012-04-02 MED ORDER — HYDROCORTISONE 1 % EX CREA: 1.0000 "application " | TOPICAL_CREAM | Freq: Three times a day (TID) | CUTANEOUS | Status: DC | PRN

## 2012-04-02 MED ORDER — SODIUM CHLORIDE 0.9 % IJ SOLN: 3.0000 mL | Freq: Two times a day (BID) | INTRAMUSCULAR | Status: DC

## 2012-04-02 MED ORDER — SODIUM CHLORIDE 0.9 % IV SOLN: 250.0000 mL | INTRAVENOUS | Status: DC

## 2012-04-02 MED ORDER — SODIUM CHLORIDE 0.9 % IJ SOLN: 3.0000 mL | INTRAMUSCULAR | Status: DC | PRN

## 2012-04-02 MED ORDER — PROPAFENONE HCL ER 225 MG PO CP12: 225.0000 mg | ORAL_CAPSULE | Freq: Two times a day (BID) | ORAL | Status: DC

## 2012-04-02 NOTE — Discharge Instructions (Signed)
You have a Stress Test scheduled  at [facility]  No food/drink after midnight before. No caffeine/decaf products 24hr before, including meds such as Excedrin or Goody Powders. Call if there are any questions. OK to take am meds with a sip of water. Arrive about 15 min early for paperwork. Wear comfortable clothes and shoes. Do NOT take: Beta blockers such as metoprolol/lopressor/Toprol XL or calcium channel blockers such as cardizem/Diltiazem or verapmil/Calan for 24 hours before the test.  Remove nitroglycerin patches and do not take nitrate preparations such as Imdur/isosorbide. No Persantine/Theophylline or Aggrenox meds should be used within 24 hours of the test. Discuss with MD what to do about diabetes meds if you take these.  When you arrive in the lab, the technician will inject a small amount of radioactive tracer. After a waiting period, resting pictures will be obtained.   You will be prepped for the stress portion of the test. With the stress (medical or treadmill), another small amount of radioactive tracer will be injected.  You will get a second set of pictures after a waiting period.   The whole test will take several hours.

## 2012-04-02 NOTE — Progress Notes (Signed)
Patient ID: Heather [name], female   DOB: [birth year], 61 y.o.   MRN: [id number]    SUBJECTIVE: No complaints.  Remains in atrial fibrillation.  Propafenone started last night.   Current Facility-Administered Medications  Medication Dose Route Frequency Provider Last Rate Last Dose  . 0.9 %  sodium chloride infusion  250 mL Intravenous PRN Joline Salt Barrett, PA      . 0.9 %  sodium chloride infusion  250 mL Intravenous Continuous Duke Salvia, MD      . 0.9 %  sodium chloride infusion  250 mL Intravenous Continuous Laurey Morale, MD      . acetaminophen (TYLENOL) tablet 650 mg  650 mg Oral Q4H PRN Darrol Jump, PA      . ALPRAZolam Prudy Feeler) tablet 0.25 mg  0.25 mg Oral BID PRN Darrol Jump, PA      . amitriptyline (ELAVIL) tablet 10 mg  10 mg Oral QHS Joline Salt Barrett, PA   10 mg at 04/01/12 2209  . apixaban (ELIQUIS) tablet 5 mg  5 mg Oral BID Joline Salt Barrett, PA   5 mg at 04/01/12 2209  . cholecalciferol (VITAMIN D) tablet 5,000 Units  5,000 Units Oral Custom Laurey Morale, MD      . diltiazem (CARDIZEM CD) 24 hr capsule 120 mg  120 mg Oral Daily Rhonda G Barrett, PA      . hydrocortisone cream 1 % 1 application  1 application Topical TID PRN Duke Salvia, MD      . lisinopril (PRINIVIL,ZESTRIL) tablet 20 mg  20 mg Oral Daily Rhonda G Barrett, PA      . loratadine (CLARITIN) tablet 10 mg  10 mg Oral Daily Joline Salt Barrett, PA   10 mg at 04/01/12 2209  . nitroGLYCERIN (NITROSTAT) SL tablet 0.4 mg  0.4 mg Sublingual Q5 Min x 3 PRN Joline Salt Barrett, PA      . ondansetron (ZOFRAN) injection 4 mg  4 mg Intravenous Q6H PRN Joline Salt Barrett, PA      . propafenone (RYTHMOL SR) 12 hr capsule 225 mg  225 mg Oral Q12H Duke Salvia, MD   225 mg at 04/01/12 2240  . sodium chloride 0.9 % injection 3 mL  3 mL Intravenous Q12H Rhonda G Barrett, PA      . sodium chloride 0.9 % injection 3 mL  3 mL Intravenous PRN Rhonda G Barrett, PA      . sodium chloride 0.9 % injection 3 mL  3 mL  Intravenous Q12H Duke Salvia, MD   3 mL at 04/01/12 2210  . sodium chloride 0.9 % injection 3 mL  3 mL Intravenous PRN Duke Salvia, MD      . sodium chloride 0.9 % injection 3 mL  3 mL Intravenous Q12H Laurey Morale, MD      . sodium chloride 0.9 % injection 3 mL  3 mL Intravenous PRN Laurey Morale, MD      . vitamin C (ASCORBIC ACID) tablet 500 mg  500 mg Oral Daily Joline Salt Barrett, PA   500 mg at 04/01/12 2209  . zolpidem (AMBIEN) tablet 5 mg  5 mg Oral QHS PRN Darrol Jump, PA      . DISCONTD: dofetilide (TIKOSYN) capsule 500 mcg  500 mcg Oral Q12H Rhonda G Barrett, PA      . DISCONTD: hydrocortisone cream 1 % 1 application  1 application Topical TID PRN Laurey Morale,  MD      . DISCONTD: Vitamin D-3 TABS 5,000 Units  5,000 Units Oral Daily Joline Salt Barrett, PA          Filed Vitals:   04/01/12 1559 04/01/12 2000 04/02/12 0000 04/02/12 0500  BP: 115/78 105/66 119/75 115/67  Pulse: 88 89 90 80  Temp: 98.3 F (36.8 C) 97.5 F (36.4 C) 97.6 F (36.4 C) 97.6 F (36.4 C)  TempSrc: Oral Oral Oral Oral  Resp: 20 18 20 20   Height: 5\' 6"  (1.676 m)     Weight: 127.2 kg (280 lb 6.8 oz)   126.554 kg (279 lb)  SpO2: 98% 97% 97% 98%    Intake/Output Summary (Last 24 hours) at 04/02/12 0741 Last data filed at 04/01/12 2210  Gross per 24 hour  Intake      3 ml  Output      1 ml  Net      2 ml    LABS: Basic Metabolic Panel:  Basename 04/02/12 0540 04/01/12 0926  NA 142 141  K 3.8 4.0  CL 105 103  CO2 29 28  GLUCOSE 88 89  BUN 16 18  CREATININE 0.85 0.80  CALCIUM 9.3 9.7  MG 1.9 1.9  PHOS -- --   RADIOLOGY: No results found.  PHYSICAL EXAM General: NAD Neck: JVP 8 cm, no thyromegaly or thyroid nodule.  Lungs: Clear to auscultation bilaterally with normal respiratory effort. CV: Nondisplaced PMI.  Heart irregular S1/S2, no S3/S4, no murmur.  No peripheral edema.  No carotid bruit.   Abdomen: Soft, nontender, no hepatosplenomegaly, no distention.    Neurologic: Alert and oriented x 3.  Psych: Normal affect. Extremities: No clubbing or cyanosis.   TELEMETRY: Reviewed telemetry pt in Atrial fibrillation, rate in 80s  ASSESSMENT AND PLAN:  61 yo with history of obesity and atrial fibrillation.  She has had ablations x 2 in the past and atrial fibrillation has recurred.  I was going to admit her for dofetilide but QTc was too long yesterday.  Therefore, I started propafenone SR 225 mg bid.  She has been on apixaban > 1 month without missing a dose.  - DCCV today after 2nd dose of propafenone - Continue apixaban. - If fails to cardiovert, will have her followup with Dr. Johney Frame for consideration of repeat ablation versus referral for convergent procedure.  - Continue diltiazem CD - ECG today.  - Plan for her to go home this afternoon.  Steffanie Dunn as outpatient => had false positive stress test in 2004 but last myoview was after that and was normal.   [name] 04/02/2012 7:45 AM

## 2012-04-02 NOTE — Transfer of Care (Signed)
Immediate Anesthesia Transfer of Care Note  Patient: Heather [name] [facility]  Procedure(s) Performed: Procedure(s) (LRB): CARDIOVERSION (N/A)  Patient Location: PACU and Nursing Unit  Anesthesia Type: General  Level of Consciousness: awake, alert  and oriented  Airway & Oxygen Therapy: Patient Spontanous Breathing and Patient connected to nasal cannula oxygen  Post-op Assessment: Report given to PACU RN, Post -op Vital signs reviewed and stable and Patient moving all extremities  Post vital signs: Reviewed and stable  Complications: No apparent anesthesia complications

## 2012-04-02 NOTE — CV Procedure (Addendum)
    Cardioversion Note  Heather [name] [id number] 04-06-1951  Procedure: DC Cardioversion Indications: Atrial Fibrillation  Procedure Details Consent: Obtained Time Out: Verified patient identification, verified procedure, site/side was marked, verified correct patient position, special equipment/implants available, Radiology Safety Procedures followed,  medications/allergies/relevent history reviewed, required imaging and test results available.  Performed  The patient has been on adequate anticoagulation.  The patient received IV Lidocaine 50 mg and then IV  Propofol 70 mg for sedation.  Synchronous cardioversion was performed at 120 joules.  The cardioversion was successful.    Complications: No apparent complications Patient did tolerate procedure well.   Heather [name], Heather [name]., MD, [facility] 04/02/2012, 2:50 PM   Discussed with Dr. Shirlee [name].  She can go home later today  on current dose of rhythmol.  She will need  A stress test in a week or so.  Follow up with Dr. Shirlee [name].   .sing

## 2012-04-02 NOTE — Progress Notes (Signed)
UR Completed Tamiko Leopard Graves-Bigelow, RN,BSN [phone]  

## 2012-04-02 NOTE — Anesthesia Preprocedure Evaluation (Addendum)
Anesthesia Evaluation  Patient identified by MRN, date of birth, ID band Patient awake    Reviewed: Allergy & Precautions, H&P , NPO status , Patient's Chart, lab work & pertinent test results  Airway Mallampati: II TM Distance: >3 FB Neck ROM: Full    Dental  (+) Teeth Intact   Pulmonary shortness of breath and with exertion, sleep apnea and Continuous Positive Airway Pressure Ventilation ,  breath sounds clear to auscultation        Cardiovascular hypertension, Pt. on medications + dysrhythmias Atrial Fibrillation Rhythm:Irregular     Neuro/Psych PSYCHIATRIC DISORDERS  Neuromuscular disease    GI/Hepatic GERD-  Medicated and Controlled,  Endo/Other  Hypothyroidism   Renal/GU      Musculoskeletal   Abdominal   Peds  Hematology   Anesthesia Other Findings   Reproductive/Obstetrics                         Anesthesia Physical Anesthesia Plan  ASA: III  Anesthesia Plan: General   Post-op Pain Management:    Induction: Intravenous  Airway Management Planned: Mask  Additional Equipment:   Intra-op Plan:   Post-operative Plan:   Informed Consent: I have reviewed the patients History and Physical, chart, labs and discussed the procedure including the risks, benefits and alternatives for the proposed anesthesia with the patient or authorized representative who has indicated his/her understanding and acceptance.     Plan Discussed with:   Anesthesia Plan Comments:         Anesthesia Quick Evaluation

## 2012-04-02 NOTE — Discharge Summary (Signed)
CARDIOLOGY DISCHARGE SUMMARY   Patient ID: Heather [name] MRN: [id number] DOB/AGE: 1951-10-11 61 y.o.  Admit date: 04/01/2012 Discharge date: 04/02/2012  Primary Discharge Diagnosis:  Symptomatic atrial fibrillation Secondary Discharge Diagnosis:  Past Medical History  Diagnosis Date  . HTN (hypertension)   . Obesity   . Hypothyroidism   . Carpal tunnel syndrome   . Atrial fibrillation     Began in 2004.  Had ablations at [facility] in 2005 and 2007.  She is off coumadin  now with no noted recurrent atrial fibrillation since 2007. til 04/01/12  . Sleep apnea 04/01/12    "dx'd just last week"  . Anemia     "onc;e; had to take iron for awhile"  . History of blood transfusion     "w/both hip replacements"  . GERD (gastroesophageal reflux disease)     resolved after lap band  . Degenerative disk disease     Procedures: Direct current cardioversion  Hospital Course: Heather [name] is a 61 year old female with a history of atrial fibrillation but not coronary artery disease. She had noted increasing dyspnea on exertion and fatigue. She was in atrial fibrillation. She was evaluated by Dr. Shirlee Latch and it was felt that she needed admission for an antiarrhythmic.  Initially, Tikosyn was recommended. However, when the preprocedure evaluation was performed, her QTC was too long to allow initiation of Tikosyn. Dr. Graciela Husbands reviewed all the data and recommended Propafenone. She was started on Propafenone and plans were made for cardioversion.  She had recently completed a sleep study with a diagnosis of severe obstructive sleep apnea. She had not yet followed up to be admitted for her CPAP. CPAP was initiated in the hospital with settings per respiratory. Case management was consulted to help with outpatient CPAP and this will be set up through the home health care agency. She is to followup with the sleep Center for further evaluation and titration of the settings.   She tolerated the new  medication well. On 04/02/2012, she had anesthesia with direct current cardioversion, resulting in sinus rhythm. She tolerated the procedure well. Post-procedure, she was feeling well and ambulating without chest pain or shortness of breath. She is considered stable for discharge, to followup in the office after a stress test.  Labs: Lab Results  Component Value Date   WBC 7.6 02/20/2012   HGB 12.3 02/20/2012   HCT 37.9 02/20/2012   MCV 83.9 02/20/2012   PLT 207.0 02/20/2012    Lab 04/02/12 0540  NA 142  K 3.8  CL 105  CO2 29  BUN 16  CREATININE 0.85  CALCIUM 9.3  PROT --  BILITOT --  ALKPHOS --  ALT --  AST --  GLUCOSE 88      Radiology: No results found.  EKG: 02-Apr-2012 14:50:44  Sinus rhythm with 1st degree A-V block Prolonged QT Abnormal ECG 10mm/s 60mm/mV 100Hz  8.0.1 12SL 241 CID: 1 Referred by: Unconfirmed Vent. rate 78 BPM PR interval 228 ms QRS duration 90 ms QT/QTc 440/501 ms P-R-T axes 71 21 23  FOLLOW UP PLANS AND APPOINTMENTS Allergies  Allergen Reactions  . Cephalexin Hives and Other (See Comments)    "throat started closing up"  . Rocephin (Ceftriaxone Sodium In Dextrose) Anaphylaxis  . Penicillins Rash  . Flecainide Acetate Other (See Comments)    "couldn't take it"  . Moxifloxacin     Does not remember the reaction  . Sulfonamide Derivatives     Does not remember reaction ; "was so  young when I had reaction to it"   Medication List  As of 04/02/2012  4:23 PM   TAKE these medications         amitriptyline 10 MG tablet   Commonly known as: ELAVIL   Take 10 mg by mouth at bedtime.      apixaban 5 MG Tabs tablet   Commonly known as: ELIQUIS   Take 1 tablet (5 mg total) by mouth 2 (two) times daily.      diltiazem 120 MG 24 hr capsule   Commonly known as: CARDIZEM CD   Take 1 capsule (120 mg total) by mouth daily.      fexofenadine 180 MG tablet   Commonly known as: ALLEGRA   Take 180 mg by mouth daily.      hydrocortisone cream 1 %    Apply 1 application topically 3 (three) times daily as needed (skin irritation).      lisinopril 20 MG tablet   Commonly known as: PRINIVIL,ZESTRIL   Take 1 tablet (20 mg total) by mouth daily.      propafenone 225 MG 12 hr capsule   Commonly known as: RYTHMOL SR   Take 1 capsule (225 mg total) by mouth every 12 (twelve) hours.      vitamin C 500 MG tablet   Commonly known as: ASCORBIC ACID   Take 500 mg by mouth daily.      Vitamin D3 5000 UNITS Tabs   Take 1 tablet by mouth See admin instructions. Take 1 tablet by mouth daily on Monday, Tuesday, Wednesday, Thursday, Friday.        CPAP     Discharge Orders    Future Appointments: Provider: Department: Dept Phone: Center:   05/20/2012 3:00 PM Esmeralda Arthur, MD [facility] [phone] None   09/23/2012 10:00 AM [facility] [phone] None      Follow-up Information    Follow up with [facility] (Stress test on June 27th at 8:15 am)    Contact information:   [address] [place] [address]       Follow up with Tereso Newcomer, PA. (See for Dr Shirlee Latch on July 5th at 11:30 am)    Contact information:   [address] [place] [address] [phone]           BRING ALL MEDICATIONS WITH YOU TO FOLLOW UP APPOINTMENTS  Time spent with patient to include physician time: 35 min Signed: Theodore Demark 04/02/2012, 4:23 PM Co-Sign MD

## 2012-04-02 NOTE — Interval H&P Note (Signed)
History and Physical Interval Note:  04/02/2012 2:40 PM  Heather [name]  has presented today for surgery, with the diagnosis of a fib  The various methods of treatment have been discussed with the patient and family. After consideration of risks, benefits and other options for treatment, the patient has consented to  Procedure(s) (LRB): CARDIOVERSION (N/A) as a surgical intervention .  The patient's history has been reviewed, patient examined, no change in status, stable for surgery.  I have reviewed the patients' chart and labs.  Questions were answered to the patient's satisfaction.     [name].

## 2012-04-02 NOTE — Anesthesia Postprocedure Evaluation (Signed)
  Anesthesia Post-op Note  Patient: Nashalie Sallis Rudy  Procedure(s) Performed: Procedure(s) (LRB): CARDIOVERSION (N/A)  Patient Location: Nursing Unit  Anesthesia Type: General  Level of Consciousness: awake, alert  and oriented  Airway and Oxygen Therapy: Patient Spontanous Breathing and Patient connected to nasal cannula oxygen  Post-op Pain: none  Post-op Assessment: Post-op Vital signs reviewed, Patient's Cardiovascular Status Stable, Respiratory Function Stable, Patent Airway and No signs of Nausea or vomiting  Post-op Vital Signs: Reviewed and stable  Complications: No apparent anesthesia complications

## 2012-04-02 NOTE — Care Management Note (Addendum)
    Page 1 of 1   04/02/2012     4:31:25 PM   CARE MANAGEMENT NOTE 04/02/2012  Patient:  Heather [name],Heather [name]   Account Number:  [id number]  Date Initiated:  04/02/2012  Documentation initiated by:  GRAVES-BIGELOW,Breuna Loveall  Subjective/Objective Assessment:   Pt admitted with a fib. Plan for initiation of propafenone (RYTHMOL SR) 12 hr capsule 225 mg.     Action/Plan:   Beneifits check in process. Will make pt aware once complete. Will continue to monitor for disposition needs.   Anticipated DC Date:  04/04/2012   Anticipated DC Plan:  HOME/SELF CARE      DC Planning Services  CM consult      PAC Choice  DURABLE MEDICAL EQUIPMENT   Choice offered to / List presented to:  C-1 Patient   DME arranged  CPAP      DME agency  [facility]        Status of service:  Completed, signed off Medicare Important Message given?   (If response is "NO", the following Medicare IM given date fields will be blank) Date Medicare IM given:   Date Additional Medicare IM given:    Discharge Disposition:  HOME/SELF CARE  Per UR Regulation:  Reviewed for med. necessity/level of care/duration of stay  If discussed at Long Length of Stay Meetings, dates discussed:    Comments:  04-02-12 1618 Tomi Bamberger, RN,BSN [phone] CM spoke to PA and will need cpap for home. CM did ask pt for choice and they chose [facility] for services. Respiratory to see settings  and order will be written for home. [facility] to deliver dme to home.

## 2012-04-02 NOTE — Progress Notes (Signed)
Pt placed on cpap on auto mode at 0015. Tolerating well.

## 2012-04-02 NOTE — H&P (View-Only) (Signed)
Patient ID: Heather [name], female   DOB: 05/25/1951, 61 y.o.   MRN: [id number]    SUBJECTIVE: No complaints.  Remains in atrial fibrillation.  Propafenone started last night.   Current Facility-Administered Medications  Medication Dose Route Frequency Provider Last Rate Last Dose  . 0.9 %  sodium chloride infusion  250 mL Intravenous PRN Rhonda G Barrett, PA      . 0.9 %  sodium chloride infusion  250 mL Intravenous Continuous Steven C Klein, MD      . 0.9 %  sodium chloride infusion  250 mL Intravenous Continuous Kamran Coker S Manish Ruggiero, MD      . acetaminophen (TYLENOL) tablet 650 mg  650 mg Oral Q4H PRN Rhonda G Barrett, PA      . ALPRAZolam (XANAX) tablet 0.25 mg  0.25 mg Oral BID PRN Rhonda G Barrett, PA      . amitriptyline (ELAVIL) tablet 10 mg  10 mg Oral QHS Rhonda G Barrett, PA   10 mg at 04/01/12 2209  . apixaban (ELIQUIS) tablet 5 mg  5 mg Oral BID Rhonda G Barrett, PA   5 mg at 04/01/12 2209  . cholecalciferol (VITAMIN D) tablet 5,000 Units  5,000 Units Oral Custom Idona Stach S Marquasia Schmieder, MD      . diltiazem (CARDIZEM CD) 24 hr capsule 120 mg  120 mg Oral Daily Rhonda G Barrett, PA      . hydrocortisone cream 1 % 1 application  1 application Topical TID PRN Steven C Klein, MD      . lisinopril (PRINIVIL,ZESTRIL) tablet 20 mg  20 mg Oral Daily Rhonda G Barrett, PA      . loratadine (CLARITIN) tablet 10 mg  10 mg Oral Daily Rhonda G Barrett, PA   10 mg at 04/01/12 2209  . nitroGLYCERIN (NITROSTAT) SL tablet 0.4 mg  0.4 mg Sublingual Q5 Min x 3 PRN Rhonda G Barrett, PA      . ondansetron (ZOFRAN) injection 4 mg  4 mg Intravenous Q6H PRN Rhonda G Barrett, PA      . propafenone (RYTHMOL SR) 12 hr capsule 225 mg  225 mg Oral Q12H Steven C Klein, MD   225 mg at 04/01/12 2240  . sodium chloride 0.9 % injection 3 mL  3 mL Intravenous Q12H Rhonda G Barrett, PA      . sodium chloride 0.9 % injection 3 mL  3 mL Intravenous PRN Rhonda G Barrett, PA      . sodium chloride 0.9 % injection 3 mL  3 mL  Intravenous Q12H Steven C Klein, MD   3 mL at 04/01/12 2210  . sodium chloride 0.9 % injection 3 mL  3 mL Intravenous PRN Steven C Klein, MD      . sodium chloride 0.9 % injection 3 mL  3 mL Intravenous Q12H Quavion Boule S Lexus Barletta, MD      . sodium chloride 0.9 % injection 3 mL  3 mL Intravenous PRN Tino Ronan S Jaecob Lowden, MD      . vitamin C (ASCORBIC ACID) tablet 500 mg  500 mg Oral Daily Rhonda G Barrett, PA   500 mg at 04/01/12 2209  . zolpidem (AMBIEN) tablet 5 mg  5 mg Oral QHS PRN Rhonda G Barrett, PA      . DISCONTD: dofetilide (TIKOSYN) capsule 500 mcg  500 mcg Oral Q12H Rhonda G Barrett, PA      . DISCONTD: hydrocortisone cream 1 % 1 application  1 application Topical TID PRN Marielouise Amey S Caydn Justen,   MD      . DISCONTD: Vitamin D-3 TABS 5,000 Units  5,000 Units Oral Daily Rhonda G Barrett, PA          Filed Vitals:   04/01/12 1559 04/01/12 2000 04/02/12 0000 04/02/12 0500  BP: 115/78 105/66 119/75 115/67  Pulse: 88 89 90 80  Temp: 98.3 F (36.8 C) 97.5 F (36.4 C) 97.6 F (36.4 C) 97.6 F (36.4 C)  TempSrc: Oral Oral Oral Oral  Resp: 20 18 20 20  Height: 5' 6" (1.676 m)     Weight: 127.2 kg (280 lb 6.8 oz)   126.554 kg (279 lb)  SpO2: 98% 97% 97% 98%    Intake/Output Summary (Last 24 hours) at 04/02/12 0741 Last data filed at 04/01/12 2210  Gross per 24 hour  Intake      3 ml  Output      1 ml  Net      2 ml    LABS: Basic Metabolic Panel:  Basename 04/02/12 0540 04/01/12 0926  NA 142 141  K 3.8 4.0  CL 105 103  CO2 29 28  GLUCOSE 88 89  BUN 16 18  CREATININE 0.85 0.80  CALCIUM 9.3 9.7  MG 1.9 1.9  PHOS -- --   RADIOLOGY: No results found.  PHYSICAL EXAM General: NAD Neck: JVP 8 cm, no thyromegaly or thyroid nodule.  Lungs: Clear to auscultation bilaterally with normal respiratory effort. CV: Nondisplaced PMI.  Heart irregular S1/S2, no S3/S4, no murmur.  No peripheral edema.  No carotid bruit.   Abdomen: Soft, nontender, no hepatosplenomegaly, no distention.    Neurologic: Alert and oriented x 3.  Psych: Normal affect. Extremities: No clubbing or cyanosis.   TELEMETRY: Reviewed telemetry pt in Atrial fibrillation, rate in 80s  ASSESSMENT AND PLAN:  61 yo with history of obesity and atrial fibrillation.  She has had ablations x 2 in the past and atrial fibrillation has recurred.  I was going to admit her for dofetilide but QTc was too long yesterday.  Therefore, I started propafenone SR 225 mg bid.  She has been on apixaban > 1 month without missing a dose.  - DCCV today after 2nd dose of propafenone - Continue apixaban. - If fails to cardiovert, will have her followup with Dr. Allred for consideration of repeat ablation versus referral for convergent procedure.  - Continue diltiazem CD - ECG today.  - Plan for her to go home this afternoon.  - Lexiscan myoview as outpatient => had false positive stress test in 2004 but last myoview was after that and was normal.   Heather [name] 04/02/2012 7:45 AM   

## 2012-04-05 NOTE — Anesthesia Postprocedure Evaluation (Signed)
  Anesthesia Post-op Note  Patient: Heather [name]  Procedure(s) Performed: Procedure(s) (LRB): CARDIOVERSION (N/A)  Patient Location: PACU  Anesthesia Type: General  Level of Consciousness: awake, alert  and oriented  Airway and Oxygen Therapy: Patient Spontanous Breathing  Post-op Pain: none  Post-op Assessment: Post-op Vital signs reviewed and Patient's Cardiovascular Status Stable  Post-op Vital Signs: stable  Complications: No apparent anesthesia complications

## 2012-04-08 ENCOUNTER — Ambulatory Visit (HOSPITAL_COMMUNITY): Payer: BC Managed Care – PPO | Attending: Cardiology | Admitting: Radiology

## 2012-04-08 ENCOUNTER — Encounter (HOSPITAL_COMMUNITY): Payer: Self-pay | Admitting: Cardiology

## 2012-04-08 VITALS — BP 108/79 | Ht 66.0 in | Wt 282.0 lb

## 2012-04-08 DIAGNOSIS — R0609 Other forms of dyspnea: Secondary | ICD-10-CM | POA: Insufficient documentation

## 2012-04-08 DIAGNOSIS — I4891 Unspecified atrial fibrillation: Secondary | ICD-10-CM

## 2012-04-08 DIAGNOSIS — Z8249 Family history of ischemic heart disease and other diseases of the circulatory system: Secondary | ICD-10-CM | POA: Insufficient documentation

## 2012-04-08 DIAGNOSIS — I10 Essential (primary) hypertension: Secondary | ICD-10-CM | POA: Insufficient documentation

## 2012-04-08 DIAGNOSIS — R5381 Other malaise: Secondary | ICD-10-CM | POA: Insufficient documentation

## 2012-04-08 DIAGNOSIS — R0602 Shortness of breath: Secondary | ICD-10-CM

## 2012-04-08 DIAGNOSIS — R0989 Other specified symptoms and signs involving the circulatory and respiratory systems: Secondary | ICD-10-CM | POA: Insufficient documentation

## 2012-04-08 MED ORDER — TECHNETIUM TC 99M TETROFOSMIN IV KIT
30.0000 | PACK | Freq: Once | INTRAVENOUS | Status: AC | PRN
  Administered 2012-04-08: 30 via INTRAVENOUS

## 2012-04-08 MED ORDER — REGADENOSON 0.4 MG/5ML IV SOLN
0.4000 mg | Freq: Once | INTRAVENOUS | Status: AC
  Administered 2012-04-08: 0.4 mg via INTRAVENOUS

## 2012-04-08 NOTE — Progress Notes (Addendum)
[facility] SITE 3 NUCLEAR MED [address] [place] [address] [phone]  Cardiology Nuclear Med Study  Heather [name] is a 61 y.o. female     MRN : [id number]     DOB: 1950-12-11  Procedure Date: 04/08/2012  Nuclear Med Background Indication for Stress Test:  Evaluation for Ischemia History:  Ablation, AFIB< '04 Heart Cath: NL Coronaries, '09 MPS: NL per PT, done at [facility], 03/26/12 ECHO: EF: 45%, 04/02/12 CARDIOVERSION,  Cardiac Risk Factors: Family History - CAD and Hypertension  Symptoms:  DOE, Fatigue and SOB   Nuclear Pre-Procedure Caffeine/Decaff Intake:  None NPO After: 7:30pm   Lungs:  clear O2 Sat: 99*% on room air. IV 0.9% NS with Angio Cath:  20g  IV Site: R Antecubital  IV Started by:  Heather [name], EMT-P  Chest Size (in):  36 Cup Size: DD  Height: 5\' 6"  (1.676 m)  Weight:  282 lb (127.914 kg)  BMI:  Body mass index is 45.52 kg/(m^2). Tech Comments:  [name]    Nuclear Med Study 1 or 2 day study: 2 day  Stress Test Type:  Lexiscan  Reading MD: Heather [name],M.D. Order Authorizing Provider:  D.Jazmin Vensel MD  Resting Radionuclide: Technetium 4m Tetrofosmin  Resting Radionuclide Dose: 33.0 mCi  On     04-14-12  Stress Radionuclide:  Technetium 76m Tetrofosmin  Stress Radionuclide Dose: 33.0 mCi  On       04-08-12          Stress Protocol Rest HR: 57 Stress HR: 77  Rest BP: 108/79 Stress BP: 117/73  Exercise Time (min): n/a METS: n/a   Predicted Max HR: 161 bpm % Max HR: 47.83 bpm Rate Pressure Product: 9009   Dose of Adenosine (mg):  n/a Dose of Lexiscan: 0.4 mg  Dose of Atropine (mg): n/a Dose of Dobutamine: n/a mcg/kg/min (at max HR)  Stress Test Technologist: Heather [name], EMT-P  Nuclear Technologist:  Heather [name], CNMT     Rest Procedure:  Myocardial perfusion imaging was performed at rest 45 minutes following the intravenous administration of Technetium 55m Tetrofosmin. Rest ECG: Sinus Bradycardia 1st degree AVB  Stress  Procedure:  The patient received IV Lexiscan 0.4 mg over 15-seconds.  Technetium 92m Tetrofosmin injected at 30-seconds.  There were no significant changes, chest pressure, and rare pacs  with Lexiscan.  Quantitative spect images were obtained after a 45 minute delay. Stress ECG: No significant change from baseline ECG  QPS Raw Data Images:  Normal; no motion artifact; normal heart/lung ratio. Stress Images:  Small, mild mid anterior perfusion defect.  Rest Images:  Small, mild mid anterior perfusion defect.  Subtraction (SDS):  Fixed, small mild mid anterior perfusion defect.  Transient Ischemic Dilatation (Normal <1.22):  1.07 Lung/Heart Ratio (Normal <0.45):  0.37  Quantitative Gated Spect Images QGS EDV:  129 ml QGS ESV:  51 ml  Impression Exercise Capacity:  Lexiscan with no exercise. BP Response:  Normal blood pressure response. Clinical Symptoms:  Chest pressure.  ECG Impression:  No significant ST segment change suggestive of ischemia. Comparison with Prior Nuclear Study: No images to compare  Overall Impression:  Low risk stress nuclear study. Fixed, small mild mid anterior perfusion defect.  Favor soft tissue attenuation rather than prior infarction given normal wall motion.  No ischemia.   LV Ejection Fraction: 60%.  LV Wall Motion:  NL LV Function; NL Wall Motion  Heather [name] 04/14/2012  No ischemic or infarction.   Heather [name] 04/16/2012

## 2012-04-14 ENCOUNTER — Ambulatory Visit (HOSPITAL_COMMUNITY): Payer: BC Managed Care – PPO | Attending: Cardiology

## 2012-04-14 DIAGNOSIS — R0989 Other specified symptoms and signs involving the circulatory and respiratory systems: Secondary | ICD-10-CM

## 2012-04-14 MED ORDER — TECHNETIUM TC 99M TETROFOSMIN IV KIT
33.0000 | PACK | Freq: Once | INTRAVENOUS | Status: AC | PRN
  Administered 2012-04-14: 33 via INTRAVENOUS

## 2012-04-16 ENCOUNTER — Ambulatory Visit: Payer: BC Managed Care – PPO | Admitting: Nurse Practitioner

## 2012-04-16 ENCOUNTER — Ambulatory Visit (INDEPENDENT_AMBULATORY_CARE_PROVIDER_SITE_OTHER): Payer: BC Managed Care – PPO | Admitting: Nurse Practitioner

## 2012-04-16 ENCOUNTER — Encounter: Payer: Self-pay | Admitting: Nurse Practitioner

## 2012-04-16 VITALS — BP 108/66 | HR 61 | Ht 67.0 in | Wt 284.4 lb

## 2012-04-16 DIAGNOSIS — G4733 Obstructive sleep apnea (adult) (pediatric): Secondary | ICD-10-CM

## 2012-04-16 DIAGNOSIS — R0602 Shortness of breath: Secondary | ICD-10-CM

## 2012-04-16 DIAGNOSIS — I10 Essential (primary) hypertension: Secondary | ICD-10-CM

## 2012-04-16 DIAGNOSIS — I4891 Unspecified atrial fibrillation: Secondary | ICD-10-CM

## 2012-04-16 DIAGNOSIS — Z7901 Long term (current) use of anticoagulants: Secondary | ICD-10-CM

## 2012-04-16 NOTE — Assessment & Plan Note (Signed)
Blood pressure looks good with her current regimen.

## 2012-04-16 NOTE — Progress Notes (Signed)
Heather [name] Date of Birth: [birth year] Medical Record #[id number]  History of Present Illness: Heather [name] is seen today for a post hospital visit. She is seen for Dr. Shirlee Latch. She has atrial fib but no CAD. She has had prior ablation x 2, sleep apnea and just recently initiated on CPAP, GERD, DJD and morbid obesity with prior lap band. She was recently admitted for initiation of Tikosyn, however her QTc was prolonged. Propafenone was started instead. She had cardioversion with restoration of sinus rhythm. CPAP was initiated during that recent admission as well. She has also had a stress test post op which was felt to be low risk. She is on Eliquis as her anticoagulation.   She comes in today. She is here alone. She is doing ok. She feels better. Has more energy. No recurrence of atrial fib that she is aware of. She is quite happy with how she is doing. She is tolerating her medicines. No chest pain. Wearing her CPAP and feels better from that standpoint as well.   Current Outpatient Prescriptions on File Prior to Visit  Medication Sig Dispense Refill  . amitriptyline (ELAVIL) 10 MG tablet Take 10 mg by mouth at bedtime.      Marland Kitchen apixaban (ELIQUIS) 5 MG TABS tablet Take 1 tablet (5 mg total) by mouth 2 (two) times daily.  60 tablet  1  . Cholecalciferol (VITAMIN D3) 5000 UNITS TABS Take 1 tablet by mouth See admin instructions. Take 1 tablet by mouth daily on Monday, Tuesday, Wednesday, Thursday, Friday.      . diltiazem (CARDIZEM CD) 120 MG 24 hr capsule Take 1 capsule (120 mg total) by mouth daily.  30 capsule  6  . fexofenadine (ALLEGRA) 180 MG tablet Take 180 mg by mouth daily.      Marland Kitchen lisinopril (PRINIVIL,ZESTRIL) 20 MG tablet Take 1 tablet (20 mg total) by mouth daily.  30 tablet  5  . propafenone (RYTHMOL SR) 225 MG 12 hr capsule Take 1 capsule (225 mg total) by mouth every 12 (twelve) hours.  60 capsule  11  . vitamin C (ASCORBIC ACID) 500 MG tablet Take 500 mg by mouth daily.         Allergies  Allergen Reactions  . Cephalexin Hives and Other (See Comments)    "throat started closing up"  . Rocephin (Ceftriaxone Sodium In Dextrose) Anaphylaxis  . Penicillins Rash  . Flecainide Acetate Other (See Comments)    "couldn't take it"  . Moxifloxacin     Does not remember the reaction  . Sulfonamide Derivatives     Does not remember reaction ; "was so young when I had reaction to it"    Past Medical History  Diagnosis Date  . HTN (hypertension)   . Obesity   . Hypothyroidism   . Carpal tunnel syndrome   . Atrial fibrillation     Began in 2004.  Had ablations at [facility] in 2005 and 2007.  She is off coumadin  now with no noted recurrent atrial fibrillation since 2007. til 04/01/12; s/p initiation of Rhythmol with DCCV June 2013  . Sleep apnea 04/01/12    "dx'd just last week"  . Anemia     "onc;e; had to take iron for awhile"  . History of blood transfusion     "w/both hip replacements"  . GERD (gastroesophageal reflux disease)     resolved after lap band  . Degenerative disk disease     Past Surgical History  Procedure Date  .  Back surgery   . Carpal tunnel release 1990's    bilaterally  . Lasik   . Cardiac catheterization   . Laparoscopic gastric banding 2009  . Cardiac electrophysiology mapping and ablation ~ 2005 and 2007    "did more 2nd time; both at [facility]"  . Tonsillectomy and adenoidectomy 1957  . Vaginal hysterectomy 2000  . Tubal ligation 1980  . Dilation and curettage of uterus 2000  . Total hip arthroplasty ~2009; 2010    right; left  . Joint replacement     bilateral hips  . Posterior fusion lumbar spine 2000's    "nerve problems"  . Posterior fusion lumbar spine 2000's  . Cardioversion 04/02/2012    Procedure: CARDIOVERSION;  Surgeon: Laurey Morale, MD;  Location: [facility] OR;  Service: Cardiovascular;  Laterality: N/A;    History  Smoking status  . Never Smoker   Smokeless tobacco  . Never Used    History   Alcohol Use No    Family History  Problem Relation Age of Onset  . Heart attack      granfather  . Hypertension Father   . Diabetes Father   . Atrial fibrillation Mother     Review of Systems: The review of systems is per the HPI.  All other systems were reviewed and are negative.  Physical Exam: BP 108/66  Pulse 61  Ht 5\' 7"  (1.702 m)  Wt 284 lb 6.4 oz (129.003 kg)  BMI 44.54 kg/m2 Patient is very pleasant and in no acute distress. She is morbidly obese. Skin is warm and dry. Color is normal.  HEENT is unremarkable. Normocephalic/atraumatic. PERRL. Sclera are nonicteric. Neck is supple. No masses. No JVD. Lungs are clear. Cardiac exam shows a regular rate and rhythm. Abdomen is soft. Extremities are without edema. Gait and ROM are intact. No gross neurologic deficits noted.  LABORATORY DATA:   EKG today shows sinus rhythm with 1st degree AV block.   Lab Results  Component Value Date   WBC 7.6 02/20/2012   HGB 12.3 02/20/2012   HCT 37.9 02/20/2012   PLT 207.0 02/20/2012   GLUCOSE 88 04/02/2012   NA 142 04/02/2012   K 3.8 04/02/2012   CL 105 04/02/2012   CREATININE 0.85 04/02/2012   BUN 16 04/02/2012   CO2 29 04/02/2012   INR 1.83* 08/13/2010      Myoview Overall Impression: Low risk stress nuclear study. Fixed, small mild mid anterior perfusion defect. Favor soft tissue attenuation rather than prior infarction given normal wall motion. No ischemia.  LV Ejection Fraction: 60%. LV Wall Motion: NL LV Function; NL Wall Motion   [name]  04/14/2012   Assessment / Plan:

## 2012-04-16 NOTE — Assessment & Plan Note (Signed)
No adverse reactions noted

## 2012-04-16 NOTE — Patient Instructions (Addendum)
Stay on your current medicines  Dr. Shirlee Latch needs to see you in about one month 05/19/12 @  4:30 pm  Stay active  Call the [facility] office at [phone] if you have any questions, problems or concerns.

## 2012-04-16 NOTE — Assessment & Plan Note (Signed)
She remains in sinus rhythm. I have left her on her current regimen including her Eliquis for now. I will have her see Dr. Shirlee Latch in one month.

## 2012-04-16 NOTE — Assessment & Plan Note (Signed)
Now wearing her CPAP and feels good with more energy.

## 2012-04-16 NOTE — Progress Notes (Signed)
lmtcb for results/ number provided

## 2012-04-18 ENCOUNTER — Other Ambulatory Visit: Payer: Self-pay | Admitting: Cardiology

## 2012-04-19 NOTE — Progress Notes (Signed)
LMTCB ./CY 

## 2012-04-20 NOTE — Progress Notes (Signed)
LMTCB

## 2012-04-22 ENCOUNTER — Telehealth: Payer: Self-pay | Admitting: Cardiology

## 2012-04-22 NOTE — Progress Notes (Signed)
Pt.notified

## 2012-04-22 NOTE — Telephone Encounter (Signed)
Fu call °Pt returning your call  °

## 2012-04-22 NOTE — Telephone Encounter (Signed)
Spoke with pt about recent myoview results 

## 2012-05-19 ENCOUNTER — Encounter: Payer: Self-pay | Admitting: Cardiology

## 2012-05-19 ENCOUNTER — Other Ambulatory Visit: Payer: Self-pay

## 2012-05-19 ENCOUNTER — Ambulatory Visit (INDEPENDENT_AMBULATORY_CARE_PROVIDER_SITE_OTHER): Payer: BC Managed Care – PPO | Admitting: Cardiology

## 2012-05-19 VITALS — BP 126/77 | HR 71 | Ht 65.0 in | Wt 292.0 lb

## 2012-05-19 DIAGNOSIS — I4891 Unspecified atrial fibrillation: Secondary | ICD-10-CM

## 2012-05-19 NOTE — Patient Instructions (Addendum)
Your physician recommends that you schedule a follow-up appointment in: 4 months with Dr McLean.  

## 2012-05-20 ENCOUNTER — Encounter: Payer: Self-pay | Admitting: Obstetrics and Gynecology

## 2012-05-20 ENCOUNTER — Ambulatory Visit (INDEPENDENT_AMBULATORY_CARE_PROVIDER_SITE_OTHER): Payer: BC Managed Care – PPO | Admitting: Obstetrics and Gynecology

## 2012-05-20 VITALS — BP 130/82 | Resp 16 | Ht 65.5 in | Wt 292.0 lb

## 2012-05-20 DIAGNOSIS — Z01419 Encounter for gynecological examination (general) (routine) without abnormal findings: Secondary | ICD-10-CM

## 2012-05-20 NOTE — Assessment & Plan Note (Signed)
Maintaining NSR on propafenone.  She is also on diltiazem CD and apixaban, both of which she will continue.  Myoview showed no ischemia in 6/13.

## 2012-05-20 NOTE — Progress Notes (Signed)
Patient ID: Heather [name], female   DOB: 1951-08-10, 61 y.o.   MRN: [id number] PCP: Dr. Christell Constant  61 yo with history of morbid obesity s/p lap band surgery and atrial fibrillation s/p ablation presents for cardiology followup.  Patient developed atrial fibrillation in 2004.  She had a cath in 2004 showing no significant CAD.  She had atrial fibrillation ablation in 2005 with a redo in 2007.  She has had no documented atrial fibrillation since 2007 prior to last appointment.  At an earlier appointment this year, Mrs Nield was noted to be in coarse atrial fibrillation with HR in the 90s.   I started her on apixaban and Toprol XL 25 mg daily.  The atrial fibrillation was persistent and seemed to be causing increased fatigue.  I planned to start her on dofetilide, but her QT interval was too long.  Instead, I started her on propafenone and cardioverted her to NSR.  She remains in NSR today.  Lexiscan myoview done given use of Ic agent showed soft tissue attenuation but no evidence for ischemia or infarction.   Mrs Fromme feels much better back in NSR.  Improved fatigue.  No exertional dyspnea.  No chest pain. No overt GI bleeding.  She feels like her heart has been staying in rhythm.   ECG: NSR, 1st degree AV block (210 msec)  Labs (3/11): BNP 65, K 4.2, creatinine 0.83, TSH normal, HCT 39.1 Labs (5/13): K 3.9, creaitnine 1.0, HCT 37.9 Labs (6/13): K 3.8, creatinine 0.85  Allergies (verified):  1)  ! Keflex 2)  ! Sulfa 3)  ! Pcn 4)  ! * Avelox 5)  ! Flecainide Acetate  Past Medical History: 1. L4/ L5 disk disease, s/p surgery 2. hypertension 3. Obesity: s/p Lap band surgery 7/09 4. Atrial fibrillation: Began in 2004.  Had ablations at [facility] in 2005 and 2007.  Unable to tolerate flecainide.  Back in atrial fibrillation in 5/13.  QT too long for dofetilide.  Propafenone started with DCCV to NSR in 6/13.  5. Left heart cath (2004): No significant CAD, false + cardiolite. EF 60%.  Lexiscan  myoview (6/13) with EF 60%, soft tissue attenuation noted but no ischemia or infarction.  6. Echo (3/11): LV EF 55-60%, no significant valvular dysfunction, RV-RA gradient 26 mmHg.  7. Hypothyroidism 8. Right hip replacement 10/10, left hip replacement 10/11 9. GERD: Resolved after lap band  Family History: Grandfather with 6 heart attacks, she is not sure what age he first had a heart attack.  Father with hypertension and diabetes, mother on medicines for irregular heart beat.   Family history negative for cancer or thyroid disease.  Social History: Works in data entry in [employer] Married, lives in [place] Tobacco Use - No.  Alcohol Use - no   Current Outpatient Prescriptions  Medication Sig Dispense Refill  . amitriptyline (ELAVIL) 10 MG tablet Take 10 mg by mouth at bedtime.      . Cholecalciferol (VITAMIN D3) 5000 UNITS TABS Take 1 tablet by mouth See admin instructions. Take 1 tablet by mouth daily on Monday, Tuesday, Wednesday, Thursday, Friday.      . diltiazem (CARDIZEM CD) 120 MG 24 hr capsule Take 1 capsule (120 mg total) by mouth daily.  30 capsule  6  . ELIQUIS 5 MG TABS tablet TAKE 1 TABLET BY MOUTH TWICE A DAY  60 tablet  1  . fexofenadine (ALLEGRA) 180 MG tablet Take 180 mg by mouth daily.      Marland Kitchen  lisinopril (PRINIVIL,ZESTRIL) 20 MG tablet TAKE 1 TABLET EVERY DAY  30 tablet  5  . propafenone (RYTHMOL SR) 225 MG 12 hr capsule Take 1 capsule (225 mg total) by mouth every 12 (twelve) hours.  60 capsule  11  . vitamin C (ASCORBIC ACID) 500 MG tablet Take 500 mg by mouth daily.        BP 126/77  Pulse 71  Ht 5\' 5"  (1.651 m)  Wt 292 lb (132.45 kg)  BMI 48.59 kg/m2 General:  Well developed, well nourished, in no acute distress. Obese.  Neck:  Neck supple, JVP 7 cm. No masses, thyromegaly or abnormal cervical nodes. Lungs:  Clear bilaterally to auscultation and percussion. Heart:  Non-displaced PMI, chest non-tender; regular rate and rhythm, S1, S2 without murmurs,  rubs or gallops. Carotid upstroke normal, no bruit. Pedals normal pulses. No edema, no varicosities. Abdomen:  Bowel sounds positive; abdomen soft and non-tender without masses, organomegaly, or hernias noted. No hepatosplenomegaly. Obese.  Extremities:  No clubbing or cyanosis. Neurologic:  Alert and oriented x 3. Psych:  Normal affect.

## 2012-05-20 NOTE — Progress Notes (Signed)
The patient is not taking hormone replacement therapy The patient  is not taking a Calcium supplement. Post-menopausal bleeding:no  Last Pap: N/A HYST  Last mammogram: approximate date 05/02/2011 and was normal  Last DEXA scan : T= -0.16 Feb 2011 Last colonoscopy:normal per pt 10 years ago she is going to schedule one 07/2012.  Urinary symptoms: none Normal bowel movements: Yes Reports abuse at home: No:   Subjective:    Heather [name] is a 61 y.o. female G3P3000 who presents for annual exam. S/P TVH The patient has no complaints today.   The following portions of the patient's history were reviewed and updated as appropriate: allergies, current medications, past family history, past medical history, past social history, past surgical history and problem list.  Review of Systems Pertinent items are noted in HPI. Gastrointestinal:No change in bowel habits, no abdominal pain, no rectal bleeding Genitourinary:negative for dysuria, frequency, hematuria, nocturia and urinary incontinence    Objective:     BP 130/82  Resp 16  Ht 5' 5.5" (1.664 m)  Wt 292 lb (132.45 kg)  BMI 47.85 kg/m2  Weight:  Wt Readings from Last 1 Encounters:  05/20/12 292 lb (132.45 kg)     BMI: Body mass index is 47.85 kg/(m^2). General Appearance: Alert, appropriate appearance for age. No acute distress HEENT: Grossly normal Neck / Thyroid: Supple, no masses, nodes or enlargement Lungs: clear to auscultation bilaterally Back: No CVA tenderness Breast Exam: No masses or nodes.No dimpling, nipple retraction or discharge. Cardiovascular: Regular rate and rhythm. S1, S2, no murmur Gastrointestinal: Soft, non-tender, no masses or organomegaly Pelvic Exam: Cystocele 2/4, rectocele 4/4, stress negative, uterus surgically absent, ovaries normal Rectovaginal: normal rectal, no masses Lymphatic Exam: Non-palpable nodes in neck, clavicular, axillary, or inguinal regions Skin: no rash or  abnormalities Neurologic: Normal gait and speech, no tremor  Psychiatric: Alert and oriented, appropriate affect.       Assessment:    Symptomatic pelvic prolapse    Plan:    Pt declines surgery for now.  Mammogram Pt to call GI for screening colonoscopy  Follow-up:  for annual exam

## 2012-05-24 ENCOUNTER — Encounter: Payer: Self-pay | Admitting: Cardiology

## 2012-05-24 NOTE — Addendum Note (Signed)
Addended by: Burnett Kanaris A on: 05/24/2012 03:20 PM   Modules accepted: Orders

## 2012-06-08 ENCOUNTER — Telehealth: Payer: Self-pay | Admitting: Cardiology

## 2012-06-08 ENCOUNTER — Ambulatory Visit (INDEPENDENT_AMBULATORY_CARE_PROVIDER_SITE_OTHER): Payer: BC Managed Care – PPO

## 2012-06-08 VITALS — BP 128/86 | HR 78 | Wt 282.8 lb

## 2012-06-08 DIAGNOSIS — I4892 Unspecified atrial flutter: Secondary | ICD-10-CM

## 2012-06-08 NOTE — Telephone Encounter (Signed)
Pt is complaining of a "racing heart" since arriving home from work last night.  No sob, no pain.  She also states she has "lines in her vision".  She states she feels really bad like she does when she is in afib.

## 2012-06-08 NOTE — Telephone Encounter (Signed)
Pt states she no longer has the blurred vision and feels like she is able to drive.  She will come in for an ekg this am.

## 2012-06-08 NOTE — Patient Instructions (Addendum)
Your physician has recommended you make the following change in your medication: Increase your Diltiazem to twice daily for the next 2-3 days.  If you don't feel any better on Friday, call us for further instructions.  Go home and take it easy per Dr Patty Sermons

## 2012-06-08 NOTE — Progress Notes (Signed)
Pt here complaining of racing heart, blurred vision (and seeing lines) and feeling poorly.  She states she feels like she does when in afib.  EKG was done and given to Dr Patty Sermons.  EKG shows atrial flutter.  Per Dr Patty Sermons, pt to go home and take it easy and increase her Diltiazem to bid for the next 2-3 days.  If still feeling poorly on Friday, she is to call back.  She was notified and agrees.

## 2012-06-08 NOTE — Telephone Encounter (Signed)
Pt calling re hear racing yesterday, still racing today and now has blurred vision, pls call

## 2012-06-09 ENCOUNTER — Telehealth: Payer: Self-pay | Admitting: Cardiology

## 2012-06-09 ENCOUNTER — Encounter: Payer: Self-pay | Admitting: *Deleted

## 2012-06-09 NOTE — Telephone Encounter (Signed)
Pt is suppose to go back to work tomorrow and she is unable to go and needs a note and approval

## 2012-06-09 NOTE — Telephone Encounter (Signed)
Spoke with pt. Pt states she is still having problems with a fast heart beat. She is requesting a note to stay out of work until her appt with Sunday Spillers 06/15/12. Per Dr Wilmon Pali to give pt a note to stay out of work until 06/15/12.

## 2012-06-10 NOTE — Telephone Encounter (Signed)
Spoke with pt and she is aware I have left note for her to stay out of work until 06/15/12  at front desk to pick up.

## 2012-06-15 ENCOUNTER — Other Ambulatory Visit: Payer: Self-pay | Admitting: Nurse Practitioner

## 2012-06-15 ENCOUNTER — Encounter: Payer: Self-pay | Admitting: Nurse Practitioner

## 2012-06-15 ENCOUNTER — Telehealth: Payer: Self-pay | Admitting: *Deleted

## 2012-06-15 ENCOUNTER — Ambulatory Visit (INDEPENDENT_AMBULATORY_CARE_PROVIDER_SITE_OTHER): Payer: BC Managed Care – PPO | Admitting: Nurse Practitioner

## 2012-06-15 ENCOUNTER — Ambulatory Visit
Admission: RE | Admit: 2012-06-15 | Discharge: 2012-06-15 | Disposition: A | Discharge status: Home / Self Care | Payer: BC Managed Care – PPO | Source: Ambulatory Visit | Attending: Nurse Practitioner | Admitting: Nurse Practitioner

## 2012-06-15 VITALS — BP 124/72 | HR 120 | Ht 65.0 in | Wt 280.4 lb

## 2012-06-15 DIAGNOSIS — Z01810 Encounter for preprocedural cardiovascular examination: Secondary | ICD-10-CM

## 2012-06-15 DIAGNOSIS — I4892 Unspecified atrial flutter: Secondary | ICD-10-CM

## 2012-06-15 LAB — CBC WITH DIFFERENTIAL/PLATELET
Basophils Absolute: 0 10*3/uL (ref 0.0–0.1)
Basophils Relative: 0.1 % (ref 0.0–3.0)
Eosinophils Absolute: 0.2 10*3/uL (ref 0.0–0.7)
Eosinophils Relative: 3.8 % (ref 0.0–5.0)
HCT: 40.5 % (ref 36.0–46.0)
Hemoglobin: 13.1 g/dL (ref 12.0–15.0)
Lymphocytes Relative: 16.9 % (ref 12.0–46.0)
Lymphs Abs: 1 10*3/uL (ref 0.7–4.0)
MCHC: 32.5 g/dL (ref 30.0–36.0)
MCV: 86.6 fl (ref 78.0–100.0)
Monocytes Absolute: 0.5 10*3/uL (ref 0.1–1.0)
Monocytes Relative: 7.8 % (ref 3.0–12.0)
Neutro Abs: 4.2 10*3/uL (ref 1.4–7.7)
Neutrophils Relative %: 71.4 % (ref 43.0–77.0)
Platelets: 240 10*3/uL (ref 150.0–400.0)
RBC: 4.67 Mil/uL (ref 3.87–5.11)
RDW: 14.2 % (ref 11.5–14.6)
WBC: 5.9 10*3/uL (ref 4.5–10.5)

## 2012-06-15 LAB — BASIC METABOLIC PANEL
BUN: 21 mg/dL (ref 6–23)
CO2: 27 mEq/L (ref 19–32)
Calcium: 9.6 mg/dL (ref 8.4–10.5)
Chloride: 102 mEq/L (ref 96–112)
Creatinine, Ser: 0.8 mg/dL (ref 0.4–1.2)
GFR: 78.92 mL/min (ref >60.00)
Glucose, Bld: 103 mg/dL — ABNORMAL HIGH (ref 70–99)
Potassium: 4.3 mEq/L (ref 3.5–5.1)
Sodium: 138 mEq/L (ref 135–145)

## 2012-06-15 LAB — APTT: aPTT: 35.7 s — ABNORMAL HIGH (ref 21.7–28.8)

## 2012-06-15 LAB — PROTIME-INR
INR: 1.3 ratio — ABNORMAL HIGH (ref 0.8–1.0)
Prothrombin Time: 14.5 s — ABNORMAL HIGH (ref 10.2–12.4)

## 2012-06-15 MED ORDER — PROPAFENONE HCL ER 325 MG PO CP12: 325.0000 mg | ORAL_CAPSULE | Freq: Two times a day (BID) | ORAL | Status: DC

## 2012-06-15 NOTE — Patient Instructions (Addendum)
We need to check labs today  We will get you an appointment with Dr. Graciela Husbands (EP) to discuss ablation  Go to [facility] to [facility] on the first floor. Walk in for your chest xray.  Increase your Rythmol to 325 mg two times a day. I have sent this to the drug store  We are going to arrange for a cardioversion tomorrow at [facility] are scheduled for a cardioversion on Wednesday at 8:30am with Dr. Ladona Ridgel or associates. Please go to [facility] 2nd Floor Endoscopy Lab at 8:30am.  Do not have any food or drink after midnight tonight.  You may take your medicines with a sip of water on the day of your procedure.  You will need someone to drive you home following your procedure.   Call the [facility] office at [phone] if you have any questions, problems or concerns.

## 2012-06-15 NOTE — Telephone Encounter (Signed)
Message copied by Awilda Bill on Tue Jun 15, 2012  4:48 PM ------      Message from: Rosalio Macadamia      Created: Tue Jun 15, 2012  4:31 PM       Ok to report. CXR is ok. For cardioversion tomorrow.

## 2012-06-15 NOTE — Progress Notes (Signed)
 Heather [name] Date of Birth: 06/22/1951 Medical Record #[id number]  History of Present Illness: [name] is seen today for a work in visit. She is seen for Dr. McLean. She has multiple medical issues which include morbid obesity, s/p lap band surgery, atrial fib with prior ablations at [facility] in 2004 and 2007. No significant CAD per cath back in 2004. Had recurrent atrial fib back this past May and started on Eliquis. Tikosyn was contemplated but her QT was prolonged. She is on Rythmol. Was cardioverted in June. Seen back in August and was doing great. Her Myoview in June was ok.   She comes in today. She is here with her husband. She basically feels lousy and like "crap". She came last week and had an EKG showing atrial flutter. Cardizem was increased. She was told to come back if no improvement. She has not converted. Rate remains elevated. No missed doses of Eliquis reported. Has no energy. Has DOE with just little activity level. Feels like stress is the trigger. She is planning on retiring at the end of this month and would like to be placed out of work until then.   Current Outpatient Prescriptions on File Prior to Visit  Medication Sig Dispense Refill  . amitriptyline (ELAVIL) 10 MG tablet Take 10 mg by mouth at bedtime.      . Cholecalciferol (VITAMIN D3) 5000 UNITS TABS Take 1 tablet by mouth See admin instructions. Take 1 tablet by mouth daily on Monday, Tuesday, Wednesday, Thursday, Friday.      . ELIQUIS 5 MG TABS tablet TAKE 1 TABLET BY MOUTH TWICE A DAY  60 tablet  1  . fexofenadine (ALLEGRA) 180 MG tablet Take 180 mg by mouth daily.      . lisinopril (PRINIVIL,ZESTRIL) 20 MG tablet TAKE 1 TABLET EVERY DAY  30 tablet  5  . vitamin C (ASCORBIC ACID) 500 MG tablet Take 500 mg by mouth daily.      . DISCONTD: diltiazem (CARDIZEM CD) 120 MG 24 hr capsule Take 1 capsule (120 mg total) by mouth daily.  30 capsule  6    Allergies  Allergen Reactions  . Cephalexin Hives and Other  (See Comments)    "throat started closing up"  . Rocephin (Ceftriaxone Sodium In Dextrose) Anaphylaxis  . Penicillins Rash  . Flecainide Acetate Other (See Comments)    "couldn't take it"  . Moxifloxacin     Does not remember the reaction  . Sulfonamide Derivatives     Does not remember reaction ; "was so young when I had reaction to it"    Past Medical History  Diagnosis Date  . HTN (hypertension)   . Obesity   . Hypothyroidism   . Carpal tunnel syndrome   . Atrial fibrillation     Began in 2004.  Had ablations at [facility] in 2005 and 2007.  She is off coumadin  now with no noted recurrent atrial fibrillation since 2007. til 04/01/12; s/p initiation of Rhythmol with DCCV June 2013  . Sleep apnea 04/01/12    "dx'd just last week"  . Anemia     "onc;e; had to take iron for awhile"  . History of blood transfusion     "w/both hip replacements"  . GERD (gastroesophageal reflux disease)     resolved after lap band  . Degenerative disk disease     Past Surgical History  Procedure Date  . Back surgery   . Carpal tunnel release 1990's      bilaterally  . Lasik   . Cardiac catheterization   . Laparoscopic gastric banding 2009  . Cardiac electrophysiology mapping and ablation ~ 2005 and 2007    "did more 2nd time; both at [facility]"  . Tonsillectomy and adenoidectomy 1957  . Vaginal hysterectomy 2000  . Tubal ligation 1980  . Dilation and curettage of uterus 2000  . Total hip arthroplasty ~2009; 2010    right; left  . Joint replacement     bilateral hips  . Posterior fusion lumbar spine 2000's    "nerve problems"  . Posterior fusion lumbar spine 2000's  . Cardioversion 04/02/2012    Procedure: CARDIOVERSION;  Surgeon: Dalton S McLean, MD;  Location: [facility] OR;  Service: Cardiovascular;  Laterality: N/A;    History  Smoking status  . Never Smoker   Smokeless tobacco  . Never Used    History  Alcohol Use No    Family History  Problem Relation Age of Onset  .  Heart attack      granfather  . Hypertension Father   . Diabetes Father   . Atrial fibrillation Mother     Review of Systems: The review of systems is positive for lots of stress with her job.  All other systems were reviewed and are negative.  Physical Exam: BP 124/72  Pulse 120  Ht 5' 5" (1.651 m)  Wt 280 lb 6.4 oz (127.189 kg)  BMI 46.66 kg/m2 Her weight is down 12 pounds since her last visit. Patient is very pleasant and in no acute distress. She is morbidly obese. Skin is warm and dry. Color is normal.  HEENT is unremarkable. Normocephalic/atraumatic. PERRL. Sclera are nonicteric. Neck is supple. No masses. No JVD. Lungs are clear. Cardiac exam shows an irregular rate and rhythm. She is tachycardic. Abdomen is obese but soft. Extremities are without edema. Gait and ROM are intact. No gross neurologic deficits noted.   LABORATORY DATA: EKG shows probable atrial flutter with RVR. Tracing was reviewed with Dr. Klein.   CXR and labs are pending.   Assessment / Plan:  Recurrent atrial flutter. I have discussed her case with Dr. Klein. He feels like this is atrial flutter. She remains on Eliquis and has had no missed doses. She has had no response with the extra Diltiazem. We are going to increase her Rythmol to 325 mg BID. We have arranged for a cardioversion tomorrow. Will arrange for EP evaluation for discussion of possible repeat ablation. She was previously treated by Dr. Fitzgerald. She would like to see Dr. Klein. For now, I have placed her out of work until next Monday. This date may be changed if indicated. Patient is agreeable to this plan and will call if any problems develop in the interim.   

## 2012-06-16 ENCOUNTER — Encounter (HOSPITAL_COMMUNITY): Payer: Self-pay | Admitting: Anesthesiology

## 2012-06-16 ENCOUNTER — Encounter (HOSPITAL_COMMUNITY)
Admission: RE | Disposition: A | Discharge status: Home / Self Care | Payer: Self-pay | Source: Ambulatory Visit | Attending: Internal Medicine

## 2012-06-16 ENCOUNTER — Encounter (HOSPITAL_COMMUNITY): Payer: Self-pay | Admitting: *Deleted

## 2012-06-16 ENCOUNTER — Ambulatory Visit (HOSPITAL_COMMUNITY)
Admission: RE | Admit: 2012-06-16 | Discharge: 2012-06-16 | Disposition: A | Discharge status: Home / Self Care | Payer: BC Managed Care – PPO | Source: Ambulatory Visit | Attending: Internal Medicine | Admitting: Internal Medicine

## 2012-06-16 ENCOUNTER — Ambulatory Visit (HOSPITAL_COMMUNITY): Payer: BC Managed Care – PPO | Admitting: Anesthesiology

## 2012-06-16 DIAGNOSIS — I4892 Unspecified atrial flutter: Secondary | ICD-10-CM | POA: Insufficient documentation

## 2012-06-16 DIAGNOSIS — I10 Essential (primary) hypertension: Secondary | ICD-10-CM | POA: Insufficient documentation

## 2012-06-16 DIAGNOSIS — Z01810 Encounter for preprocedural cardiovascular examination: Secondary | ICD-10-CM

## 2012-06-16 DIAGNOSIS — Z9884 Bariatric surgery status: Secondary | ICD-10-CM | POA: Insufficient documentation

## 2012-06-16 HISTORY — PX: CARDIOVERSION: SHX1299

## 2012-06-16 SURGERY — CARDIOVERSION
Anesthesia: General | Wound class: Clean

## 2012-06-16 MED ORDER — SODIUM CHLORIDE 0.9 % IV SOLN
Freq: Once | INTRAVENOUS | Status: AC
  Administered 2012-06-16: 500 mL via INTRAVENOUS

## 2012-06-16 MED ORDER — SODIUM CHLORIDE 0.9 % IV SOLN
INTRAVENOUS | Status: DC | PRN
  Administered 2012-06-16: 10:00:00 via INTRAVENOUS

## 2012-06-16 MED ORDER — PROPOFOL 10 MG/ML IV EMUL
INTRAVENOUS | Status: DC | PRN
  Administered 2012-06-16: 70 mg via INTRAVENOUS

## 2012-06-16 MED ORDER — LIDOCAINE HCL (CARDIAC) 20 MG/ML IV SOLN
INTRAVENOUS | Status: DC | PRN
  Administered 2012-06-16: 50 mg via INTRAVENOUS

## 2012-06-16 MED ORDER — DEXTROSE-NACL 5-0.45 % IV SOLN: INTRAVENOUS | Status: DC

## 2012-06-16 MED ORDER — SODIUM CHLORIDE 0.9 % IV SOLN
INTRAVENOUS | Status: DC
  Administered 2012-06-16: 09:00:00 via INTRAVENOUS

## 2012-06-16 NOTE — Transfer of Care (Signed)
Immediate Anesthesia Transfer of Care Note  Patient: Heather [name]  Procedure(s) Performed: Procedure(s) (LRB) with comments: CARDIOVERSION (N/A) - amanda/ebp/Beverly( or scheduling)  Patient Location: Endoscopy Unit  Anesthesia Type: General  Level of Consciousness: awake, alert , oriented and patient cooperative  Airway & Oxygen Therapy: Patient Spontanous Breathing and Patient connected to nasal cannula oxygen  Post-op Assessment: Post -op Vital signs reviewed and stable  Post vital signs: Reviewed and stable  Complications: No apparent anesthesia complications

## 2012-06-16 NOTE — Anesthesia Preprocedure Evaluation (Signed)
Anesthesia Evaluation  Patient identified by MRN, date of birth, ID band Patient awake    Reviewed: Allergy & Precautions, H&P , NPO status , Patient's Chart, lab work & pertinent test results  Airway Mallampati: II TM Distance: <3 FB Neck ROM: Full    Dental   Pulmonary shortness of breath, with exertion and at rest, sleep apnea ,    + decreased breath sounds      Cardiovascular hypertension, + dysrhythmias Atrial Fibrillation Rhythm:Irregular Rate:Normal     Neuro/Psych    GI/Hepatic GERD-  ,  Endo/Other  Hypothyroidism Morbid obesity  Renal/GU      Musculoskeletal   Abdominal (+) + obese,   Peds  Hematology   Anesthesia Other Findings   Reproductive/Obstetrics                           Anesthesia Physical Anesthesia Plan  ASA: III  Anesthesia Plan: General   Post-op Pain Management:    Induction: Intravenous  Airway Management Planned: Mask  Additional Equipment:   Intra-op Plan:   Post-operative Plan:   Informed Consent: I have reviewed the patients History and Physical, chart, labs and discussed the procedure including the risks, benefits and alternatives for the proposed anesthesia with the patient or authorized representative who has indicated his/her understanding and acceptance.     Plan Discussed with: CRNA and Surgeon  Anesthesia Plan Comments:         Anesthesia Quick Evaluation

## 2012-06-16 NOTE — H&P (View-Only) (Signed)
Heather [name] Date of Birth: 1951-04-13 Medical Record #[id number]  History of Present Illness: [name] is seen today for a work in visit. She is seen for Dr. Shirlee Latch. She has multiple medical issues which include morbid obesity, s/p lap band surgery, atrial fib with prior ablations at [facility] in 2004 and 2007. No significant CAD per cath back in 2004. Had recurrent atrial fib back this past May and started on Eliquis. Tikosyn was contemplated but her QT was prolonged. She is on Rythmol. Was cardioverted in June. Seen back in August and was doing great. Her Myoview in June was ok.   She comes in today. She is here with her husband. She basically feels lousy and like "crap". She came last week and had an EKG showing atrial flutter. Cardizem was increased. She was told to come back if no improvement. She has not converted. Rate remains elevated. No missed doses of Eliquis reported. Has no energy. Has DOE with just little activity level. Feels like stress is the trigger. She is planning on retiring at the end of this month and would like to be placed out of work until then.   Current Outpatient Prescriptions on File Prior to Visit  Medication Sig Dispense Refill  . amitriptyline (ELAVIL) 10 MG tablet Take 10 mg by mouth at bedtime.      . Cholecalciferol (VITAMIN D3) 5000 UNITS TABS Take 1 tablet by mouth See admin instructions. Take 1 tablet by mouth daily on Monday, Tuesday, Wednesday, Thursday, Friday.      Marland Kitchen ELIQUIS 5 MG TABS tablet TAKE 1 TABLET BY MOUTH TWICE A DAY  60 tablet  1  . fexofenadine (ALLEGRA) 180 MG tablet Take 180 mg by mouth daily.      Marland Kitchen lisinopril (PRINIVIL,ZESTRIL) 20 MG tablet TAKE 1 TABLET EVERY DAY  30 tablet  5  . vitamin C (ASCORBIC ACID) 500 MG tablet Take 500 mg by mouth daily.      Marland Kitchen DISCONTD: diltiazem (CARDIZEM CD) 120 MG 24 hr capsule Take 1 capsule (120 mg total) by mouth daily.  30 capsule  6    Allergies  Allergen Reactions  . Cephalexin Hives and Other  (See Comments)    "throat started closing up"  . Rocephin (Ceftriaxone Sodium In Dextrose) Anaphylaxis  . Penicillins Rash  . Flecainide Acetate Other (See Comments)    "couldn't take it"  . Moxifloxacin     Does not remember the reaction  . Sulfonamide Derivatives     Does not remember reaction ; "was so young when I had reaction to it"    Past Medical History  Diagnosis Date  . HTN (hypertension)   . Obesity   . Hypothyroidism   . Carpal tunnel syndrome   . Atrial fibrillation     Began in 2004.  Had ablations at [facility] in 2005 and 2007.  She is off coumadin  now with no noted recurrent atrial fibrillation since 2007. til 04/01/12; s/p initiation of Rhythmol with DCCV June 2013  . Sleep apnea 04/01/12    "dx'd just last week"  . Anemia     "onc;e; had to take iron for awhile"  . History of blood transfusion     "w/both hip replacements"  . GERD (gastroesophageal reflux disease)     resolved after lap band  . Degenerative disk disease     Past Surgical History  Procedure Date  . Back surgery   . Carpal tunnel release 1990's  bilaterally  . Lasik   . Cardiac catheterization   . Laparoscopic gastric banding 2009  . Cardiac electrophysiology mapping and ablation ~ 2005 and 2007    "did more 2nd time; both at [facility]"  . Tonsillectomy and adenoidectomy 1957  . Vaginal hysterectomy 2000  . Tubal ligation 1980  . Dilation and curettage of uterus 2000  . Total hip arthroplasty ~2009; 2010    right; left  . Joint replacement     bilateral hips  . Posterior fusion lumbar spine 2000's    "nerve problems"  . Posterior fusion lumbar spine 2000's  . Cardioversion 04/02/2012    Procedure: CARDIOVERSION;  Surgeon: Laurey Morale, MD;  Location: [facility] OR;  Service: Cardiovascular;  Laterality: N/A;    History  Smoking status  . Never Smoker   Smokeless tobacco  . Never Used    History  Alcohol Use No    Family History  Problem Relation Age of Onset  .  Heart attack      granfather  . Hypertension Father   . Diabetes Father   . Atrial fibrillation Mother     Review of Systems: The review of systems is positive for lots of stress with her job.  All other systems were reviewed and are negative.  Physical Exam: BP 124/72  Pulse 120  Ht 5\' 5"  (1.651 m)  Wt 280 lb 6.4 oz (127.189 kg)  BMI 46.66 kg/m2 Her weight is down 12 pounds since her last visit. Patient is very pleasant and in no acute distress. She is morbidly obese. Skin is warm and dry. Color is normal.  HEENT is unremarkable. Normocephalic/atraumatic. PERRL. Sclera are nonicteric. Neck is supple. No masses. No JVD. Lungs are clear. Cardiac exam shows an irregular rate and rhythm. She is tachycardic. Abdomen is obese but soft. Extremities are without edema. Gait and ROM are intact. No gross neurologic deficits noted.   LABORATORY DATA: EKG shows probable atrial flutter with RVR. Tracing was reviewed with Dr. Graciela Husbands.   CXR and labs are pending.   Assessment / Plan:  Recurrent atrial flutter. I have discussed her case with Dr. Graciela Husbands. He feels like this is atrial flutter. She remains on Eliquis and has had no missed doses. She has had no response with the extra Diltiazem. We are going to increase her Rythmol to 325 mg BID. We have arranged for a cardioversion tomorrow. Will arrange for EP evaluation for discussion of possible repeat ablation. She was previously treated by Dr. Sampson Goon. She would like to see Dr. Graciela Husbands. For now, I have placed her out of work until next Monday. This date may be changed if indicated. Patient is agreeable to this plan and will call if any problems develop in the interim.

## 2012-06-16 NOTE — Anesthesia Postprocedure Evaluation (Signed)
  Anesthesia Post-op Note  Patient: Heather [name]  Procedure(s) Performed: Procedure(s) (LRB) with comments: CARDIOVERSION (N/A) - amanda/ebp/Beverly( or scheduling)  Patient Location: Endoscopy Unit  Anesthesia Type: General  Level of Consciousness: awake, alert , oriented and patient cooperative  Airway and Oxygen Therapy: Patient Spontanous Breathing and Patient connected to nasal cannula oxygen  Post-op Pain: none  Post-op Assessment: Post-op Vital signs reviewed, Patient's Cardiovascular Status Stable, Respiratory Function Stable and Patent Airway  Post-op Vital Signs: Reviewed and stable  Complications: No apparent anesthesia complications

## 2012-06-16 NOTE — Preoperative (Signed)
Beta Blockers   Reason not to administer Beta Blockers:Not Applicable 

## 2012-06-16 NOTE — CV Procedure (Signed)
    Cardioversion Note  TAMIEKA RANCOURT [id number] 1951/06/19  Procedure: DC Cardioversion Indications: Atrial Flutter  Procedure Details Consent: Obtained Time Out: Verified patient identification, verified procedure, site/side was marked, verified correct patient position, special equipment/implants available, Radiology Safety Procedures followed,  medications/allergies/relevent history reviewed, required imaging and test results available.  Performed  The patient has been on adequate anticoagulation.  The patient received IV Propofol 70 mg  for sedation.  Synchronous cardioversion was performed at 50 J  joules.  The cardioversion was successful.     Complications: No apparent complications Patient did tolerate procedure well.   [name], [name]., MD, [facility] 06/16/2012, 10:23 AM

## 2012-06-16 NOTE — Anesthesia Postprocedure Evaluation (Signed)
  Anesthesia Post-op Note  Patient: Heather [name]  Procedure(s) Performed: Procedure(s) (LRB) with comments: CARDIOVERSION (N/A) - amanda/ebp/Beverly( or scheduling)  Patient Location: PACU and Endoscopy Unit  Anesthesia Type: General  Level of Consciousness: awake and alert   Airway and Oxygen Therapy: Patient Spontanous Breathing  Post-op Pain: none  Post-op Assessment: Post-op Vital signs reviewed, Patient's Cardiovascular Status Stable, Respiratory Function Stable, Patent Airway, No signs of Nausea or vomiting and Pain level controlled  Post-op Vital Signs: stable  Complications: No apparent anesthesia complications

## 2012-06-16 NOTE — Interval H&P Note (Signed)
History and Physical Interval Note:  06/16/2012 10:19 AM  Heather [name]  has presented today for surgery, with the diagnosis of atrial flutter  The various methods of treatment have been discussed with the patient and family. After consideration of risks, benefits and other options for treatment, the patient has consented to  Procedure(s) (LRB) with comments: CARDIOVERSION (N/A) - amanda/ebp/Beverly( or scheduling) as a surgical intervention .  The patient's history has been reviewed, patient examined, no change in status, stable for surgery.  I have reviewed the patient's chart and labs.  Questions were answered to the patient's satisfaction.     [name].

## 2012-06-17 ENCOUNTER — Encounter (HOSPITAL_COMMUNITY): Payer: Self-pay | Admitting: Cardiovascular Disease

## 2012-06-18 ENCOUNTER — Other Ambulatory Visit: Payer: Self-pay | Admitting: Cardiology

## 2012-06-23 ENCOUNTER — Encounter: Payer: Self-pay | Admitting: Nurse Practitioner

## 2012-06-23 ENCOUNTER — Telehealth: Payer: Self-pay | Admitting: *Deleted

## 2012-06-23 ENCOUNTER — Ambulatory Visit (INDEPENDENT_AMBULATORY_CARE_PROVIDER_SITE_OTHER): Payer: BC Managed Care – PPO | Admitting: Nurse Practitioner

## 2012-06-23 VITALS — BP 120/68 | HR 63 | Ht 65.0 in | Wt 281.1 lb

## 2012-06-23 DIAGNOSIS — I4892 Unspecified atrial flutter: Secondary | ICD-10-CM

## 2012-06-23 MED ORDER — DILTIAZEM HCL ER COATED BEADS 120 MG PO CP24: 120.0000 mg | ORAL_CAPSULE | Freq: Every day | ORAL | Status: DC

## 2012-06-23 NOTE — Telephone Encounter (Signed)
Pt aware to change her diltiazem to one tablet daily. Vista Mink, CMA

## 2012-06-23 NOTE — Patient Instructions (Addendum)
See Dr. Shirlee Latch in one month with a repeat EKG  We will still arrange for a consult with Dr. Graciela Husbands to discuss an ablation  Stay on your current medicines  You may return to work on Monday BUT only for 8 hours per day  Call the [facility] office at [phone] if you have any questions, problems or concerns.

## 2012-06-23 NOTE — Telephone Encounter (Signed)
Message copied by Awilda Bill on Wed Jun 23, 2012  3:18 PM ------      Message from: Rosalio Macadamia      Created: Wed Jun 23, 2012 12:00 PM       Marchelle Folks,      Will you call Ms. Snider. She and I talked about cutting her diltiazem back to just one a day. I did not change put this change on her AVS.            Thanks      lori

## 2012-06-23 NOTE — Progress Notes (Signed)
Heather [name] Date of Birth: 1950/11/05 Medical Record #[id number]  History of Present Illness: Heather [name] is seen back today for a one week check. She is seen for Dr. Shirlee Latch. She has multiple medical issues which include morbid obesity, s/p lap band surgery, atrial fib with prior ablations at [facility] in 2004 and 2007. No significant CAD per cath back in 2004. Had recurrent atrial fib back this past May and started on Eliquis. Tikosyn was contemplated but her QT was prolonged. She is on Rythmol. Was cardioverted in June. Seen back in August and was doing great. Her Myoview in June was ok. Seen last week and was back in atrial flutter. We increased her Rythmol and arranged for DCCV which was successful. We have also referred her to Dr. Graciela Husbands for consideration of a repeat ablation.  She comes in today. She is here alone. Feeling better since the cardioversion. Some fatigue with the medicines but overall she notes improvement. She is to return to work next Monday. She is planning on retiring at the end of this month. She is expected to work 10 to 12 hours per day and feels like stress with her job has been the triggering event. She would like to cut her diltiazem back. She remains on her Eliquis.    Current Outpatient Prescriptions on File Prior to Visit  Medication Sig Dispense Refill  . amitriptyline (ELAVIL) 10 MG tablet Take 10 mg by mouth at bedtime.      . Cholecalciferol (VITAMIN D3) 5000 UNITS TABS Take 1 tablet by mouth See admin instructions. Take 1 tablet by mouth daily on Monday, Tuesday, Wednesday, Thursday, Friday.      Marland Kitchen ELIQUIS 5 MG TABS tablet TAKE 1 TABLET BY MOUTH TWICE A DAY  60 tablet  1  . fexofenadine (ALLEGRA) 180 MG tablet Take 180 mg by mouth daily.      Marland Kitchen lisinopril (PRINIVIL,ZESTRIL) 20 MG tablet TAKE 1 TABLET EVERY DAY  30 tablet  5  . propafenone (RYTHMOL SR) 325 MG 12 hr capsule Take 1 capsule (325 mg total) by mouth 2 (two) times daily.  60 capsule  11  . vitamin C  (ASCORBIC ACID) 500 MG tablet Take 500 mg by mouth daily.      Marland Kitchen DISCONTD: diltiazem (CARDIZEM CD) 120 MG 24 hr capsule Take 120 mg by mouth 2 (two) times daily.        Allergies  Allergen Reactions  . Cephalexin Hives and Other (See Comments)    "throat started closing up"  . Rocephin (Ceftriaxone Sodium In Dextrose) Anaphylaxis  . Penicillins Rash  . Flecainide Acetate Other (See Comments)    "couldn't take it"  . Moxifloxacin     Does not remember the reaction  . Sulfonamide Derivatives     Does not remember reaction ; "was so young when I had reaction to it"    Past Medical History  Diagnosis Date  . HTN (hypertension)   . Obesity   . Hypothyroidism   . Carpal tunnel syndrome   . Atrial fibrillation     Began in 2004.  Had ablations at [facility] in 2005 and 2007.  She is off coumadin  now with no noted recurrent atrial fibrillation since 2007. til 04/01/12; s/p initiation of Rhythmol with DCCV June 2013  . Anemia     "onc;e; had to take iron for awhile"  . History of blood transfusion     "w/both hip replacements"  . GERD (gastroesophageal reflux disease)  resolved after lap band  . Degenerative disk disease   . Sleep apnea 04/01/12    "dx'd just last week"    Past Surgical History  Procedure Date  . Back surgery   . Carpal tunnel release 1990's    bilaterally  . Lasik   . Cardiac catheterization   . Laparoscopic gastric banding 2009  . Cardiac electrophysiology mapping and ablation ~ 2005 and 2007    "did more 2nd time; both at [facility]"  . Tonsillectomy and adenoidectomy 1957  . Vaginal hysterectomy 2000  . Tubal ligation 1980  . Dilation and curettage of uterus 2000  . Total hip arthroplasty ~2009; 2010    right; left  . Joint replacement     bilateral hips  . Posterior fusion lumbar spine 2000's    "nerve problems"  . Posterior fusion lumbar spine 2000's  . Cardioversion 04/02/2012    Procedure: CARDIOVERSION;  Surgeon: Laurey Morale, MD;   Location: [facility] OR;  Service: Cardiovascular;  Laterality: N/A;  . Cardioversion 06/16/2012    Procedure: CARDIOVERSION;  Surgeon: [name], MD;  Location: [facility] ENDOSCOPY;  Service: Cardiovascular;  Laterality: N/A;  amanda/ebp/Beverly( or scheduling)    History  Smoking status  . Never Smoker   Smokeless tobacco  . Never Used    History  Alcohol Use No    Family History  Problem Relation Age of Onset  . Heart attack      granfather  . Hypertension Father   . Diabetes Father   . Atrial fibrillation Mother     Review of Systems: The review of systems is per the HPI.  All other systems were reviewed and are negative.  Physical Exam: BP 120/68  Pulse 63  Ht 5\' 5"  (1.651 m)  Wt 281 lb 1.9 oz (127.515 kg)  BMI 46.78 kg/m2 Patient is very pleasant and in no acute distress. She is morbidly obese. Skin is warm and dry. Color is normal.  HEENT is unremarkable. Normocephalic/atraumatic. PERRL. Sclera are nonicteric. Neck is supple. No masses. No JVD. Lungs are clear. Cardiac exam shows a regular rate and rhythm today. Abdomen is obese but soft. Extremities are without edema. Gait and ROM are intact. No gross neurologic deficits noted.  LABORATORY DATA: EKG today shows sinus rhythm. 1st degree AV block.   Lab Results  Component Value Date   WBC 5.9 06/15/2012   HGB 13.1 06/15/2012   HCT 40.5 06/15/2012   PLT 240.0 06/15/2012   GLUCOSE 103* 06/15/2012   NA 138 06/15/2012   K 4.3 06/15/2012   CL 102 06/15/2012   CREATININE 0.8 06/15/2012   BUN 21 06/15/2012   CO2 27 06/15/2012   INR 1.3* 06/15/2012   Assessment / Plan:  1. Paroxysmal atrial flutter - now s/p DCCV which was successful. She is on higher doses of her Rythmol as well. We still have an EP consult in the works. I have cut the Diltiazem back to just once a day. We will have her see Dr. Shirlee Latch in one month with a repeat EKG.  2. Stress - she is wanting to be put out of work until the end of the month. I do not think I can justify this,  however, I have limited her to only working 8 hours each day. She will be retiring at the end of the month.   Patient is agreeable to this plan and will call if any problems develop in the interim.

## 2012-07-29 ENCOUNTER — Ambulatory Visit (INDEPENDENT_AMBULATORY_CARE_PROVIDER_SITE_OTHER): Payer: BC Managed Care – PPO | Admitting: Cardiology

## 2012-07-29 ENCOUNTER — Encounter: Payer: Self-pay | Admitting: Cardiology

## 2012-07-29 VITALS — BP 143/84 | HR 65 | Ht 65.0 in | Wt 286.0 lb

## 2012-07-29 DIAGNOSIS — I4891 Unspecified atrial fibrillation: Secondary | ICD-10-CM

## 2012-07-29 DIAGNOSIS — I10 Essential (primary) hypertension: Secondary | ICD-10-CM

## 2012-07-29 MED ORDER — PROPAFENONE HCL ER 225 MG PO CP12: 225.0000 mg | ORAL_CAPSULE | Freq: Two times a day (BID) | ORAL | Status: DC

## 2012-07-29 NOTE — Patient Instructions (Addendum)
Your physician has recommended you make the following change in your medication: decrease Rythmol to 225 mg every 12 hours  Your physician wants you to follow-up in: 4 months. You will receive a reminder letter in the mail two months in advance. If you don't receive a letter, please call our office to schedule the follow-up appointment.  You are scheduled to see Dr. Graciela Husbands on 10-25

## 2012-07-30 DIAGNOSIS — E669 Obesity, unspecified: Secondary | ICD-10-CM | POA: Insufficient documentation

## 2012-07-30 NOTE — Progress Notes (Signed)
Patient ID: Heather [name], female   DOB: 11/15/1950, 61 y.o.   MRN: [id number] PCP: Dr. Christell Constant  61 yo with history of morbid obesity s/p lap band surgery and atrial fibrillation s/p ablation presents for cardiology followup.  Patient developed atrial fibrillation in 2004.  She had a cath in 2004 showing no significant CAD.  She had atrial fibrillation ablation in 2005 with a redo in 2007.  She has had no documented atrial fibrillation since 2007 prior to last appointment.  At an earlier appointment this year, Mrs Heather [name] was noted to be in coarse atrial fibrillation with HR in the 90s.   I started her on apixaban and Toprol XL 25 mg daily.  The atrial fibrillation was persistent and seemed to be causing increased fatigue.  I planned to start her on dofetilide, but her QT interval was too long.  Instead, I started her on propafenone and cardioverted her to NSR in 6/13.  In 9/13, she was in atrial flutter.  We increased her propafenone to 325 mg bid and cardioverted her to NSR.  She remains in NSR today.  Lexiscan myoview done given use of Ic agent showed soft tissue attenuation but no evidence for ischemia or infarction in 6/13.   Since increasing propafenone, she has noted increased exertional dyspnea.  She is short of breath walking to the mailbox.  She tries to walk 20 minutes daily for exercise but has to stop periodically during her walk to catch her breath.  She did not notice these symptoms on the lower propafenone dose.  No chest pain. She has retired.   ECG: NSR, nonspecific T wave changes.  Labs (3/11): BNP 65, K 4.2, creatinine 0.83, TSH normal, HCT 39.1 Labs (5/13): K 3.9, creaitnine 1.0, HCT 37.9 Labs (6/13): K 3.8, creatinine 0.85 Labs (9/13): K 4.3, creatinine 0.8  Allergies (verified):  1)  ! Keflex 2)  ! Sulfa 3)  ! Pcn 4)  ! * Avelox 5)  ! Flecainide Acetate  Past Medical History: 1. L4/ L5 disk disease, s/p surgery 2. hypertension 3. Obesity: s/p Lap band surgery 7/09 4.  Atrial fibrillation: Began in 2004.  Had ablations at [facility] in 2005 and 2007.  Unable to tolerate flecainide.  Back in atrial fibrillation in 5/13.  QT too long for dofetilide.  Propafenone started with DCCV to NSR in 6/13.  Atrial flutter in 9/13 with cardioversion back to NSR.  5. Left heart cath (2004): No significant CAD, false + cardiolite. EF 60%.  Lexiscan myoview (6/13) with EF 60%, soft tissue attenuation noted but no ischemia or infarction.  6. Echo (3/11): LV EF 55-60%, no significant valvular dysfunction, RV-RA gradient 26 mmHg.  7. Hypothyroidism 8. Right hip replacement 10/10, left hip replacement 10/11 9. GERD: Resolved after lap band  Family History: Grandfather with 6 heart attacks, she is not sure what age he first had a heart attack.  Father with hypertension and diabetes, mother on medicines for irregular heart beat.   Family history negative for cancer or thyroid disease.  Social History: Works in data entry in [employer] Married, lives in [place] Tobacco Use - No.  Alcohol Use - no  ROS: All systems reviewed and negative except as per HPI.    Current Outpatient Prescriptions  Medication Sig Dispense Refill  . amitriptyline (ELAVIL) 10 MG tablet Take 10 mg by mouth at bedtime.      . Cholecalciferol (VITAMIN D3) 5000 UNITS TABS Take 1 tablet by mouth See admin  instructions. Take 1 tablet by mouth daily on Monday, Tuesday, Wednesday, Thursday, Friday.      . diltiazem (CARDIZEM CD) 120 MG 24 hr capsule Take 1 capsule (120 mg total) by mouth daily.      Marland Kitchen ELIQUIS 5 MG TABS tablet TAKE 1 TABLET BY MOUTH TWICE A DAY  60 tablet  1  . fexofenadine (ALLEGRA) 180 MG tablet Take 180 mg by mouth daily.      Marland Kitchen lisinopril (PRINIVIL,ZESTRIL) 20 MG tablet TAKE 1 TABLET EVERY DAY  30 tablet  5  . propafenone (RYTHMOL SR) 225 MG 12 hr capsule Take 1 capsule (225 mg total) by mouth every 12 (twelve) hours.  60 capsule  4  . vitamin C (ASCORBIC ACID) 500 MG tablet Take  500 mg by mouth daily.        BP 143/84  Pulse 65  Ht 5\' 5"  (1.651 m)  Wt 286 lb (129.729 kg)  BMI 47.59 kg/m2 General:  Well developed, well nourished, in no acute distress. Obese.  Neck:  Neck supple, JVP 7 cm. No masses, thyromegaly or abnormal cervical nodes. Lungs:  Clear bilaterally to auscultation and percussion. Heart:  Non-displaced PMI, chest non-tender; regular rate and rhythm, S1, S2 without murmurs, rubs or gallops. Carotid upstroke normal, no bruit. Pedals normal pulses. No edema, no varicosities. Abdomen:  Bowel sounds positive; abdomen soft and non-tender without masses, organomegaly, or hernias noted. No hepatosplenomegaly. Obese.  Extremities:  No clubbing or cyanosis. Neurologic:  Alert and oriented x 3. Psych:  Normal affect.  Assessment/Plan:  1. Atrial arrhythmias: Patient has had atrial fibrillation s/p ablation and redo ablation and flutter.  Most recently, she had atrial flutter and was cardioverted to NSR after increasing propafenone.  She has become significantly more short of breath since increasing the propafenone.  She is in NSR today.  I will have her cut the propafenone back to 225 mg bid.  She is going to be seen by Dr. Graciela Husbands next week for consideration of atrial flutter ablation.   2. Obesity: Needs to work on weight loss.  She is now retired and has more time for exercise.   [name] 07/30/2012

## 2012-08-04 ENCOUNTER — Telehealth: Payer: Self-pay | Admitting: Obstetrics and Gynecology

## 2012-08-04 NOTE — Telephone Encounter (Signed)
sr pt 

## 2012-08-05 NOTE — Telephone Encounter (Signed)
Pt states she is ready to schedule surgery for sometime in Jan.  Was told by SR that there is a cream she should use for 2 months prior. Pelvic prolapse with rectocele 4/4. Will forward to SR for rx and instruction.  Pt agreeable.  ld

## 2012-08-06 ENCOUNTER — Ambulatory Visit (INDEPENDENT_AMBULATORY_CARE_PROVIDER_SITE_OTHER): Payer: BC Managed Care – PPO | Admitting: Internal Medicine

## 2012-08-06 ENCOUNTER — Encounter: Payer: Self-pay | Admitting: Internal Medicine

## 2012-08-06 VITALS — BP 121/70 | HR 70 | Wt 284.0 lb

## 2012-08-06 DIAGNOSIS — I4891 Unspecified atrial fibrillation: Secondary | ICD-10-CM

## 2012-08-06 NOTE — Assessment & Plan Note (Signed)
The patient's atrial fibrillation which has recurred as a coarse fibrillation will likely recur again. However, the treatment of her sleep apnea and a decreased stress related to her retired from her job may allow the intervals between episodes to be sufficiently long episodic cardioversion may service for some time. In the event that this is not the case consideration could be given to the use of dronaderone or amiodarone. She could also be reconsidered for catheter ablation which might be somewhat of a challenge

## 2012-08-06 NOTE — Progress Notes (Signed)
Patient Care Team: Donita Brooks, MD as PCP - General (Family Medicine)   HPI  Heather [name] is a 61 y.o. female Seen again after having been seen in hospital in June 13. She has a history of atrial fibrillation and is status post catheter ablation x2 at [facility]; she had recurrences of coarse atrial fibrillation the summer and was treated with propafenone initially at 225 and then because of recurrences 325 twice daily the latter does having to be decreased because of symptoms. She was last cardioverted in September and has been holding sinus rhythm since. She has retired from her job at the school   system and is feeling much better and much less stressed  She is taking apixaban as an anticoagulant and tolerating it well. She has been diagnosed with sleep apnea and is using her CPAP with some improvement   Past Medical History  Diagnosis Date  . HTN (hypertension)   . Obesity   . Hypothyroidism   . Carpal tunnel syndrome   . Atrial fibrillation     Began in 2004.  Had ablations at [facility] in 2005 and 2007.  She is off coumadin  now with no noted recurrent atrial fibrillation since 2007. til 04/01/12; s/p initiation of Rhythmol with DCCV June 2013  . Anemia     "onc;e; had to take iron for awhile"  . History of blood transfusion     "w/both hip replacements"  . GERD (gastroesophageal reflux disease)     resolved after lap band  . Degenerative disk disease   . Sleep apnea 04/01/12    "dx'd just last week"    Past Surgical History  Procedure Date  . Back surgery   . Carpal tunnel release 1990's    bilaterally  . Lasik   . Cardiac catheterization   . Laparoscopic gastric banding 2009  . Cardiac electrophysiology mapping and ablation ~ 2005 and 2007    "did more 2nd time; both at [facility]"  . Tonsillectomy and adenoidectomy 1957  . Vaginal hysterectomy 2000  . Tubal ligation 1980  . Dilation and curettage of uterus 2000  . Total hip arthroplasty ~2009; 2010     right; left  . Joint replacement     bilateral hips  . Posterior fusion lumbar spine 2000's    "nerve problems"  . Posterior fusion lumbar spine 2000's  . Cardioversion 04/02/2012    Procedure: CARDIOVERSION;  Surgeon: Laurey Morale, MD;  Location: [facility] OR;  Service: Cardiovascular;  Laterality: N/A;  . Cardioversion 06/16/2012    Procedure: CARDIOVERSION;  Surgeon: [name], MD;  Location: [facility] ENDOSCOPY;  Service: Cardiovascular;  Laterality: N/A;  amanda/ebp/Beverly( or scheduling)    Current Outpatient Prescriptions  Medication Sig Dispense Refill  . amitriptyline (ELAVIL) 10 MG tablet Take 10 mg by mouth at bedtime.      . Cholecalciferol (VITAMIN D3) 5000 UNITS TABS Take 1 tablet by mouth See admin instructions. Take 1 tablet by mouth daily on Monday, Tuesday, Wednesday, Thursday, Friday.      . diltiazem (CARDIZEM CD) 120 MG 24 hr capsule Take 1 capsule (120 mg total) by mouth daily.      Marland Kitchen ELIQUIS 5 MG TABS tablet TAKE 1 TABLET BY MOUTH TWICE A DAY  60 tablet  1  . fexofenadine (ALLEGRA) 180 MG tablet Take 180 mg by mouth daily.      Marland Kitchen lisinopril (PRINIVIL,ZESTRIL) 20 MG tablet TAKE 1 TABLET EVERY DAY  30 tablet  5  .  propafenone (RYTHMOL SR) 225 MG 12 hr capsule Take 1 capsule (225 mg total) by mouth every 12 (twelve) hours.  60 capsule  4  . vitamin C (ASCORBIC ACID) 500 MG tablet Take 500 mg by mouth daily.        Allergies  Allergen Reactions  . Cephalexin Hives and Other (See Comments)    "throat started closing up"  . Rocephin (Ceftriaxone Sodium In Dextrose) Anaphylaxis  . Penicillins Rash  . Flecainide Acetate Other (See Comments)    "couldn't take it"  . Moxifloxacin     Does not remember the reaction  . Sulfonamide Derivatives     Does not remember reaction ; "was so young when I had reaction to it"    Review of Systems negative except from HPI and PMH  Physical Exam BP 121/70  Pulse 70  Wt 284 lb (128.822 kg) Well developed and morbidly obese  female in no distress  HENT normal Neck supple with JVP-flat Carotids brisk and full without bruits Clear Regular rate and rhythm, no murmurs or gallops Abd-soft with active BS without hepatomegaly No Clubbing cyanosis edema Skin-warm and dry A & Oriented  Grossly normal sensory and motor function   electrocardiogram demonstrates sinus rhythm at 70 Exline intervals 21/09/40 Axis XIV   Assessment and  Plan

## 2012-08-09 ENCOUNTER — Other Ambulatory Visit: Payer: Self-pay | Admitting: Obstetrics and Gynecology

## 2012-08-09 NOTE — Telephone Encounter (Signed)
Estrace 1 g per vagina 3 x weekly 8 weeks prior to surgery. Will send request for surgical scheduling

## 2012-08-10 MED ORDER — ESTRADIOL 0.1 MG/GM VA CREA: TOPICAL_CREAM | VAGINAL | Status: DC

## 2012-08-10 NOTE — Telephone Encounter (Signed)
Pt notified of Estrace called in. 3 x weekly for 8 weeks prior to surgery.  Pt agreeable.  ld

## 2012-08-16 ENCOUNTER — Other Ambulatory Visit: Payer: Self-pay | Admitting: Cardiology

## 2012-09-07 ENCOUNTER — Telehealth: Payer: Self-pay | Admitting: Cardiology

## 2012-09-07 NOTE — Telephone Encounter (Signed)
She can hold Eliquis for procedure and restart afterwards.

## 2012-09-07 NOTE — Telephone Encounter (Signed)
Spoke to Hunter at [facility] office she wanted to know if ok for patient to hold eliquis on 12/3 for colonoscopy 09/16/12.Message sent to Dr.McLean for advice.

## 2012-09-07 NOTE — Telephone Encounter (Signed)
Dr Madilyn Fireman  Office needs pt to stop eliquis on 12-3 for colonoscopy on 12-5, requested last week ,   fax ok to [phone]

## 2012-09-08 NOTE — Telephone Encounter (Signed)
Dr.Hayes office called spoke to Efraim Kaufmann was told Dr.Mclean advised ok for patient to hold Eliquis before colonoscopy and may restart afterwards.

## 2012-09-14 ENCOUNTER — Telehealth: Payer: Self-pay | Admitting: Obstetrics and Gynecology

## 2012-09-14 NOTE — Telephone Encounter (Signed)
A&P Repair Scheduled for 10/20/12 @ 7:30 with SR/EP.  BCBS effective 08/13/12; Plan pays 80/20 after a $350 deductible. [employer] effective 04/12/12 pays 100%. -Adrianne Pridgen

## 2012-09-22 ENCOUNTER — Encounter: Payer: Self-pay | Admitting: Cardiology

## 2012-09-22 ENCOUNTER — Ambulatory Visit (INDEPENDENT_AMBULATORY_CARE_PROVIDER_SITE_OTHER): Payer: BC Managed Care – PPO | Admitting: Pharmacist

## 2012-09-22 ENCOUNTER — Ambulatory Visit (INDEPENDENT_AMBULATORY_CARE_PROVIDER_SITE_OTHER): Payer: BC Managed Care – PPO | Admitting: Cardiology

## 2012-09-22 VITALS — BP 120/76 | HR 62 | Ht 66.0 in | Wt 292.5 lb

## 2012-09-22 DIAGNOSIS — I4891 Unspecified atrial fibrillation: Secondary | ICD-10-CM

## 2012-09-22 DIAGNOSIS — R0989 Other specified symptoms and signs involving the circulatory and respiratory systems: Secondary | ICD-10-CM

## 2012-09-22 LAB — CBC WITH DIFFERENTIAL/PLATELET
Basophils Relative: 1 % (ref 0.0–3.0)
HCT: 35.7 % — ABNORMAL LOW (ref 36.0–46.0)
Hemoglobin: 11.7 g/dL — ABNORMAL LOW (ref 12.0–15.0)
Lymphocytes Relative: 16.2 % (ref 12.0–46.0)
Lymphs Abs: 0.8 10*3/uL (ref 0.7–4.0)
MCHC: 32.9 g/dL (ref 30.0–36.0)
Monocytes Relative: 7.5 % (ref 3.0–12.0)
Neutro Abs: 3.5 10*3/uL (ref 1.4–7.7)
RBC: 4.15 Mil/uL (ref 3.87–5.11)
RDW: 14 % (ref 11.5–14.6)

## 2012-09-22 NOTE — Patient Instructions (Addendum)
Your physician recommends that you have lab work today--CBCd.  Your physician wants you to follow-up in: 6 months with Dr Shirlee Latch. (June 2014).  You will receive a reminder letter in the mail two months in advance. If you don't receive a letter, please call our office to schedule the follow-up appointment.

## 2012-09-22 NOTE — Progress Notes (Signed)
Patient ID: Heather [name], female   DOB: 03/14/1951, 61 y.o.   MRN: [id number] PCP: Dr. Christell Constant  61 yo with history of morbid obesity s/p lap band surgery and atrial fibrillation s/p ablation presents for cardiology followup.  Patient developed atrial fibrillation in 2004.  She had a cath in 2004 showing no significant CAD.  She had atrial fibrillation ablation in 2005 with a redo in 2007.  She has had no documented atrial fibrillation since 2007 prior to last appointment.  At an earlier appointment this year, Heather [name] was noted to be in coarse atrial fibrillation with HR in the 90s.   I started her on apixaban and Toprol XL 25 mg daily.  The atrial fibrillation was persistent and seemed to be causing increased fatigue.  I planned to start her on dofetilide, but her QT interval was too long.  Instead, I started her on propafenone and cardioverted her to NSR in 6/13.  In 9/13, she was in atrial flutter.  We increased her propafenone to 325 mg bid and cardioverted her to NSR.  Lexiscan myoview done given use of Ic agent showed soft tissue attenuation but no evidence for ischemia or infarction in 6/13.  She had increased dyspnea with propafenone at 325 mg bid, so it was decreased back to 225 mg bid.  This has helped, she feels like she is back to normal.   She is under less stress since retiring.  She is in NSR today.  No tachypalpitations.  She has been doing some walking and also is doing a fitness boot camp at church.  She is using her CPAP for OSA.   ECG: NSR, 1st degree AV block.   Labs (3/11): BNP 65, K 4.2, creatinine 0.83, TSH normal, HCT 39.1 Labs (5/13): K 3.9, creaitnine 1.0, HCT 37.9 Labs (6/13): K 3.8, creatinine 0.85 Labs (9/13): K 4.3, creatinine 0.8  Allergies (verified):  1)  ! Keflex 2)  ! Sulfa 3)  ! Pcn 4)  ! * Avelox 5)  ! Flecainide Acetate  Past Medical History: 1. L4/ L5 disk disease, s/p surgery 2. hypertension 3. Obesity: s/p Lap band surgery 7/09 4. Atrial  fibrillation: Began in 2004.  Had ablations at [facility] in 2005 and 2007.  Unable to tolerate flecainide.  Back in atrial fibrillation in 5/13.  QT too long for dofetilide.  Propafenone started with DCCV to NSR in 6/13.  Atrial flutter in 9/13 with cardioversion back to NSR.  Dyspnea with propafenone 325 mg bid, can tolerate 225 mg bid.  5. Left heart cath (2004): No significant CAD, false + cardiolite. EF 60%.  Lexiscan myoview (6/13) with EF 60%, soft tissue attenuation noted but no ischemia or infarction.  6. Echo (3/11): LV EF 55-60%, no significant valvular dysfunction, RV-RA gradient 26 mmHg.  7. Hypothyroidism 8. Right hip replacement 10/10, left hip replacement 10/11 9. GERD: Resolved after lap band  Family History: Grandfather with 6 heart attacks, she is not sure what age he first had a heart attack.  Father with hypertension and diabetes, mother on medicines for irregular heart beat.   Family history negative for cancer or thyroid disease.  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  Married, lives in [place] Tobacco Use - No.  Alcohol Use - no   Current Outpatient Prescriptions  Medication Sig Dispense Refill  . amitriptyline (ELAVIL) 10 MG tablet Take 10 mg by mouth at bedtime.      . Cholecalciferol (VITAMIN  D3) 5000 UNITS TABS Take 1 tablet by mouth See admin instructions. Take 1 tablet by mouth daily on Monday, Tuesday, Wednesday, Thursday, Friday.      . diltiazem (CARDIZEM CD) 120 MG 24 hr capsule Take 1 capsule (120 mg total) by mouth daily.      Marland Kitchen ELIQUIS 5 MG TABS tablet TAKE 1 TABLET BY MOUTH TWICE A DAY  60 tablet  1  . estradiol (ESTRACE VAGINAL) 0.1 MG/GM vaginal cream One applicator per vagina 3 times a week.  42.5 g  6  . fexofenadine (ALLEGRA) 180 MG tablet Take 180 mg by mouth daily.      Marland Kitchen lisinopril (PRINIVIL,ZESTRIL) 20 MG tablet TAKE 1 TABLET EVERY DAY  30 tablet  5  . propafenone (RYTHMOL SR) 225 MG 12 hr capsule Take 1  capsule (225 mg total) by mouth every 12 (twelve) hours.  60 capsule  4  . vitamin C (ASCORBIC ACID) 500 MG tablet Take 500 mg by mouth daily.        BP 120/76  Pulse 62  Ht 5\' 6"  (1.676 m)  Wt 292 lb 8 oz (132.677 kg)  BMI 47.21 kg/m2 General:  Well developed, well nourished, in no acute distress. Obese.  Neck:  Neck supple, JVP 7 cm. No masses, thyromegaly or abnormal cervical nodes. Lungs:  Clear bilaterally to auscultation and percussion. Heart:  Non-displaced PMI, chest non-tender; regular rate and rhythm, S1, S2 without murmurs, rubs or gallops. Carotid upstroke normal, no bruit. Pedals normal pulses. No edema, no varicosities. Abdomen:  Bowel sounds positive; abdomen soft and non-tender without masses, organomegaly, or hernias noted. No hepatosplenomegaly. Obese.  Extremities:  No clubbing or cyanosis. Neurologic:  Alert and oriented x 3. Psych:  Normal affect.  Assessment/Plan:  1. Atrial arrhythmias: Patient has had atrial fibrillation s/p ablation and redo ablation and flutter.  Most recently, she had atrial flutter and was cardioverted to NSR after increasing propafenone.  She developed exertional dyspnea on the higher dose of propafenone.  We went back down on propafenone to 225 mg bid with improvement in symptoms.  She is wearing her CPAP for OSA and is under less stress now that she is retired.  She is in NSR today.  Will draw CBC today on apixaban.  2. Obesity: Needs to work on weight loss.  She is now retired and has more time for exercise.   [name] 09/22/2012

## 2012-09-22 NOTE — Progress Notes (Signed)
Pt was started on Eliquis for Atrial fibrillation on Feb 22, 2012.  She returns today for her 6 month follow up.   Reviewed patients medication list.  Pt  is not currently on any combined P-gp and strong CYP3A4 inhibitors/inducers (ketoconazole, traconazole, ritonavir, carbamazepine, phenytoin, rifampin, St. John's wort).  Reviewed labs.  SCr 0.8, Weight- 132 kg.  Dose appropriate.   Hgb and HCT slightly lower at 11.7 and 35.7 respectively.    Pt does not report any unusual signs of bruising or bleeding.  She has been compliant with her doses.  She has not had any problems refilling the medication or her insurance covering the cost.  She does have a procedure coming up in January.  She has not had her pre-op appt yet but will make sure they are aware she is on Eliquis and will call with any questions. Next lab test test in 3 months since Hgb dropped from baseline.

## 2012-09-24 ENCOUNTER — Ambulatory Visit (INDEPENDENT_AMBULATORY_CARE_PROVIDER_SITE_OTHER): Payer: BC Managed Care – PPO | Admitting: Obstetrics and Gynecology

## 2012-09-24 ENCOUNTER — Encounter: Payer: Self-pay | Admitting: Obstetrics and Gynecology

## 2012-09-24 VITALS — BP 132/80 | HR 68 | Temp 98.0°F | Resp 16 | Ht 64.0 in | Wt 284.0 lb

## 2012-09-24 DIAGNOSIS — M5136 Other intervertebral disc degeneration, lumbar region: Secondary | ICD-10-CM | POA: Insufficient documentation

## 2012-09-24 DIAGNOSIS — N819 Female genital prolapse, unspecified: Secondary | ICD-10-CM

## 2012-09-24 DIAGNOSIS — Z9884 Bariatric surgery status: Secondary | ICD-10-CM

## 2012-09-24 DIAGNOSIS — E079 Disorder of thyroid, unspecified: Secondary | ICD-10-CM | POA: Insufficient documentation

## 2012-09-24 DIAGNOSIS — M5137 Other intervertebral disc degeneration, lumbosacral region: Secondary | ICD-10-CM

## 2012-09-24 DIAGNOSIS — M51369 Other intervertebral disc degeneration, lumbar region without mention of lumbar back pain or lower extremity pain: Secondary | ICD-10-CM | POA: Insufficient documentation

## 2012-09-24 DIAGNOSIS — Z01818 Encounter for other preprocedural examination: Secondary | ICD-10-CM

## 2012-09-24 NOTE — Progress Notes (Signed)
Heather [name] is a 61 y.o. female G3P3000 who is S/P hysterectomy, presents for an anterior/posterior colporrhaphy because of pelvic prolapse. For the past several years the patient has noticed worsening pelvic prolapse-(felt with wiping, along with urinary frequency but no urgency, incontinence or difficulty with bowel movements. She has used vaginal estradiol without any noticeable changes in her symptoms and though she was given medical management options, she desires to proceed with anterior/posterior colporrhaphy.   Past Medical History   OB History: G3P3000 SVD, 1973, 1976 and 1980   GYN History: menarche: 61 YO; Contracepton hysterectomy The patient denies history of sexually transmitted disease. 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   Surgical History: Hysterectomy (2000), Carpal Tunnel Release (bilateral), Lumbar fusion (2004), Hip Surgeries (Bilateral 2009 & 2011), Laparoscopic Gastric Band, Cardiac Ablation, Removal of right Morton's Neuroma, Tonsillectomy & Adenoidectomy, Tubal Sterilzation, and D & C.   Denies problems with anesthesia but does have a history of blood transfusions x 2 with both hip surgeries   Family History: Atrial Fibrillation, diabetes mellitus, heart disease and hypertension.   Social History: Married and is retired from [employer]; Denies alcohol, tobacco or illicit drug use   Outpatient Encounter Prescriptions as of 09/24/2012  Medication Sig Dispense Refill  . amitriptyline (ELAVIL) 10 MG tablet Take 10 mg by mouth at bedtime.  . Cholecalciferol (VITAMIN D3) 5000 UNITS TABS Take 1 tablet by mouth See admin instructions. Take 1 tablet by mouth daily on Monday, Tuesday, Wednesday, Thursday, Friday.  .  diltiazem (CARDIZEM CD) 120 MG 24 hr capsule Take 1 capsule (120 mg total) by mouth daily.  . ELIQUIS 5 MG TABS tablet TAKE 1 TABLET BY MOUTH TWICE A DAY 60 tablet 1  . estradiol (ESTRACE VAGINAL) 0.1 MG/GM vaginal cream One applicator per vagina 3 times a week. 42.5 g 6  . fexofenadine (ALLEGRA) 180 MG tablet Take 180 mg by mouth daily.  . lisinopril (PRINIVIL,ZESTRIL) 20 MG tablet TAKE 1 TABLET EVERY DAY 30 tablet 5  . propafenone (RYTHMOL SR) 225 MG 12 hr capsule Take 1 capsule (225 mg total) by mouth every 12 (twelve) hours. 60 capsule 4  . vitamin C (ASCORBIC ACID) 500 MG tablet Take 500 mg by mouth daily.   Allergies  Allergen Reactions  . Cephalexin Hives and Other (See Comments)  "throat started closing up"  . Rocephin (Ceftriaxone Sodium In Dextrose) Anaphylaxis  . Penicillins Rash  . Flecainide Acetate Other (See Comments)  "couldn't take it"  . Moxifloxacin   Does not remember the reaction  . Sulfonamide Derivatives  Does not remember reaction ; "was so young when I had reaction to it"   Denies sensitivity to peanuts, shellfish, soy, or latex. Admits to adhesives causing itching and rash.   ROS: Denies headache, vision changes, nasal congestion, dysphagia, tinnitus, dizziness, hoarseness, cough, chest pain, shortness of breath, nausea, vomiting, diarrhea,constipation, urgency dysuria, hematuria, vaginitis symptoms, pelvic pain, swelling of joints,easy bruising, myalgias, arthralgias, skin rashes, unexplained weight loss and except as is mentioned in the history of present illness, patient's review of systems is otherwise negative.   Physical Exam   BP 132/80  Pulse 68  Temp 98 F (36.7 C) (Oral)  Resp 16  Ht 5' 4" (1.626 m)  Wt 284 lb (128.822 kg)  BMI 48.75 kg/m2     Neck: supple without masses or thyromegaly  Lungs: clear to auscultation  Heart: regular rate and rhythm  Abdomen: soft, non-tender and no organomegaly  Pelvic:EGBUS- wnl; vagina with visible prolapse  at inner vaginal opening, no lesions, cystocele 3/4 with 4/4 rectocele; uterus/cervix-surgically absent, adnexae-no tenderness or masses  Extremities: no clubbing, cyanosis or edema   Assesment: Pelvic Prolapse   Disposition: A discussion was held with patient regarding the indication for her procedure(s) along with the risks, which include but are not limited to: reaction to anesthesia, damage to adjacent organs, infection, worsening of symptoms and excessive bleeding. Patient verbalized understanding of these risks and has consented to proceed with Anterior/Posterior Colporrhaphy at [facility], October 20, 2012 at 7:30 a.m.   CSN# [id number]  Marites Nath J. Andruw Battie, PA-C for Dr. Sandra A. Rivard   

## 2012-09-27 NOTE — H&P (Signed)
Heather [name] is a 61 y.o. female G3P3000 who is S/P hysterectomy, presents for an anterior/posterior colporrhaphy because of pelvic prolapse. For the past several years the patient has noticed worsening pelvic prolapse-(felt with wiping, along with urinary frequency but no urgency, incontinence or difficulty with bowel movements. She has used vaginal estradiol without any noticeable changes in her symptoms and though she was given medical management options, she desires to proceed with anterior/posterior colporrhaphy.   Past Medical History   OB History: G3P3000 SVD, 1973, 1976 and 1980   GYN History: menarche: 61 YO; Contracepton hysterectomy The patient denies history of sexually transmitted disease. 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   Surgical History: Hysterectomy (2000), Carpal Tunnel Release (bilateral), Lumbar fusion (2004), Hip Surgeries (Bilateral 2009 & 2011), Laparoscopic Gastric Band, Cardiac Ablation, Removal of right Morton's Neuroma, Tonsillectomy & Adenoidectomy, Tubal Sterilzation, and D & C.   Denies problems with anesthesia but does have a history of blood transfusions x 2 with both hip surgeries   Family History: Atrial Fibrillation, diabetes mellitus, heart disease and hypertension.   Social History: Married and is retired from [employer]; Denies alcohol, tobacco or illicit drug use   Outpatient Encounter Prescriptions as of 09/24/2012  Medication Sig Dispense Refill  . amitriptyline (ELAVIL) 10 MG tablet Take 10 mg by mouth at bedtime.  . Cholecalciferol (VITAMIN D3) 5000 UNITS TABS Take 1 tablet by mouth See admin instructions. Take 1 tablet by mouth daily on Monday, Tuesday, Wednesday, Thursday, Friday.  .  diltiazem (CARDIZEM CD) 120 MG 24 hr capsule Take 1 capsule (120 mg total) by mouth daily.  . ELIQUIS 5 MG TABS tablet TAKE 1 TABLET BY MOUTH TWICE A DAY 60 tablet 1  . estradiol (ESTRACE VAGINAL) 0.1 MG/GM vaginal cream One applicator per vagina 3 times a week. 42.5 g 6  . fexofenadine (ALLEGRA) 180 MG tablet Take 180 mg by mouth daily.  . lisinopril (PRINIVIL,ZESTRIL) 20 MG tablet TAKE 1 TABLET EVERY DAY 30 tablet 5  . propafenone (RYTHMOL SR) 225 MG 12 hr capsule Take 1 capsule (225 mg total) by mouth every 12 (twelve) hours. 60 capsule 4  . vitamin C (ASCORBIC ACID) 500 MG tablet Take 500 mg by mouth daily.   Allergies  Allergen Reactions  . Cephalexin Hives and Other (See Comments)  "throat started closing up"  . Rocephin (Ceftriaxone Sodium In Dextrose) Anaphylaxis  . Penicillins Rash  . Flecainide Acetate Other (See Comments)  "couldn't take it"  . Moxifloxacin   Does not remember the reaction  . Sulfonamide Derivatives  Does not remember reaction ; "was so young when I had reaction to it"   Denies sensitivity to peanuts, shellfish, soy, or latex. Admits to adhesives causing itching and rash.   ROS: Denies headache, vision changes, nasal congestion, dysphagia, tinnitus, dizziness, hoarseness, cough, chest pain, shortness of breath, nausea, vomiting, diarrhea,constipation, urgency dysuria, hematuria, vaginitis symptoms, pelvic pain, swelling of joints,easy bruising, myalgias, arthralgias, skin rashes, unexplained weight loss and except as is mentioned in the history of present illness, patient's review of systems is otherwise negative.   Physical Exam   BP 132/80  Pulse 68  Temp 98 F (36.7 C) (Oral)  Resp 16  Ht 5' 4" (1.626 m)  Wt 284 lb (128.822 kg)  BMI 48.75 kg/m2     Neck: supple without masses or thyromegaly  Lungs: clear to auscultation  Heart: regular rate and rhythm  Abdomen: soft, non-tender and no organomegaly  Pelvic:EGBUS- wnl; vagina with visible prolapse  at inner vaginal opening, no lesions, cystocele 3/4 with 4/4 rectocele; uterus/cervix-surgically absent, adnexae-no tenderness or masses  Extremities: no clubbing, cyanosis or edema   Assesment: Pelvic Prolapse   Disposition: A discussion was held with patient regarding the indication for her procedure(s) along with the risks, which include but are not limited to: reaction to anesthesia, damage to adjacent organs, infection, worsening of symptoms and excessive bleeding. Patient verbalized understanding of these risks and has consented to proceed with Anterior/Posterior Colporrhaphy at [facility], October 20, 2012 at 7:30 a.m.   CSN# [id number]  Ryelynn Guedea J. Corby Villasenor, PA-C for Dr. Sandra A. Rivard   

## 2012-10-09 ENCOUNTER — Other Ambulatory Visit: Payer: Self-pay | Admitting: Cardiology

## 2012-10-11 ENCOUNTER — Encounter (HOSPITAL_COMMUNITY): Payer: Self-pay | Admitting: Pharmacist

## 2012-10-18 ENCOUNTER — Encounter (HOSPITAL_COMMUNITY): Payer: Self-pay

## 2012-10-18 ENCOUNTER — Encounter (HOSPITAL_COMMUNITY)
Admission: RE | Admit: 2012-10-18 | Discharge: 2012-10-18 | Disposition: A | Discharge status: Home / Self Care | Payer: BC Managed Care – PPO | Source: Ambulatory Visit | Attending: Obstetrics and Gynecology | Admitting: Obstetrics and Gynecology

## 2012-10-18 HISTORY — DX: Interstitial cystitis (chronic) without hematuria: N30.10

## 2012-10-18 LAB — CBC
HCT: 39.3 % (ref 36.0–46.0)
Hemoglobin: 12.3 g/dL (ref 12.0–15.0)
MCH: 28 pg (ref 26.0–34.0)
MCHC: 31.3 g/dL (ref 30.0–36.0)
RDW: 13.8 % (ref 11.5–15.5)

## 2012-10-18 LAB — COMPREHENSIVE METABOLIC PANEL
Albumin: 3.8 g/dL (ref 3.5–5.2)
BUN: 26 mg/dL — ABNORMAL HIGH (ref 6–23)
Calcium: 9.9 mg/dL (ref 8.4–10.5)
Creatinine, Ser: 0.84 mg/dL (ref 0.50–1.10)
GFR calc Af Amer: 86 mL/min — ABNORMAL LOW (ref >90)
Glucose, Bld: 96 mg/dL (ref 70–99)
Total Protein: 7.4 g/dL (ref 6.0–8.3)

## 2012-10-18 LAB — URINALYSIS, ROUTINE W REFLEX MICROSCOPIC
Ketones, ur: NEGATIVE mg/dL
Leukocytes, UA: NEGATIVE
Nitrite: NEGATIVE
Specific Gravity, Urine: 1.02 (ref 1.005–1.030)
pH: 6 (ref 5.0–8.0)

## 2012-10-18 NOTE — Patient Instructions (Addendum)
Your procedure is scheduled on:10/20/12  Enter through the Main Entrance at :0700 am Pick up desk phone and dial [phone] and inform us of your arrival.  Please call [phone] if you have any problems the morning of surgery.  Remember: Do not eat or drink after midnight:Tuesday   Take these meds the morning of surgery with a sip of water:Diltiazem, Lisinopril, Propafenone  DO NOT wear jewelry, eye make-up, lipstick,body lotion, or dark fingernail polish. Do not shave for 48 hours prior to surgery.  If you are to be admitted after surgery, leave suitcase in car until your room has been assigned. Patients discharged on the day of surgery will not be allowed to drive home.

## 2012-10-19 ENCOUNTER — Other Ambulatory Visit: Payer: Self-pay | Admitting: Cardiology

## 2012-10-20 ENCOUNTER — Ambulatory Visit (HOSPITAL_COMMUNITY)
Admission: RE | Admit: 2012-10-20 | Discharge: 2012-10-21 | Disposition: A | Discharge status: Home / Self Care | Payer: BC Managed Care – PPO | Source: Ambulatory Visit | Attending: Obstetrics and Gynecology | Admitting: Obstetrics and Gynecology

## 2012-10-20 ENCOUNTER — Ambulatory Visit (HOSPITAL_COMMUNITY): Payer: BC Managed Care – PPO | Admitting: Anesthesiology

## 2012-10-20 ENCOUNTER — Other Ambulatory Visit: Payer: Self-pay | Admitting: *Deleted

## 2012-10-20 ENCOUNTER — Encounter (HOSPITAL_COMMUNITY): Payer: Self-pay | Admitting: Anesthesiology

## 2012-10-20 ENCOUNTER — Encounter (HOSPITAL_COMMUNITY)
Admission: RE | Disposition: A | Discharge status: Home / Self Care | Payer: Self-pay | Source: Ambulatory Visit | Attending: Obstetrics and Gynecology

## 2012-10-20 ENCOUNTER — Encounter (HOSPITAL_COMMUNITY): Payer: Self-pay | Admitting: *Deleted

## 2012-10-20 DIAGNOSIS — N815 Vaginal enterocele: Secondary | ICD-10-CM | POA: Insufficient documentation

## 2012-10-20 DIAGNOSIS — I4891 Unspecified atrial fibrillation: Secondary | ICD-10-CM | POA: Insufficient documentation

## 2012-10-20 DIAGNOSIS — N8189 Other female genital prolapse: Secondary | ICD-10-CM

## 2012-10-20 DIAGNOSIS — Z01818 Encounter for other preprocedural examination: Secondary | ICD-10-CM | POA: Insufficient documentation

## 2012-10-20 DIAGNOSIS — K469 Unspecified abdominal hernia without obstruction or gangrene: Secondary | ICD-10-CM

## 2012-10-20 DIAGNOSIS — N816 Rectocele: Secondary | ICD-10-CM

## 2012-10-20 DIAGNOSIS — Z01812 Encounter for preprocedural laboratory examination: Secondary | ICD-10-CM | POA: Insufficient documentation

## 2012-10-20 DIAGNOSIS — I10 Essential (primary) hypertension: Secondary | ICD-10-CM | POA: Insufficient documentation

## 2012-10-20 DIAGNOSIS — N993 Prolapse of vaginal vault after hysterectomy: Secondary | ICD-10-CM | POA: Insufficient documentation

## 2012-10-20 HISTORY — PX: ANTERIOR AND POSTERIOR REPAIR: SHX5121

## 2012-10-20 SURGERY — ANTERIOR (CYSTOCELE) AND POSTERIOR REPAIR (RECTOCELE)
Anesthesia: Spinal | Site: Vagina | Wound class: Clean Contaminated

## 2012-10-20 MED ORDER — SCOPOLAMINE 1 MG/3DAYS TD PT72
MEDICATED_PATCH | TRANSDERMAL | Status: AC
  Administered 2012-10-20: 1.5 mg via TRANSDERMAL
  Filled 2012-10-20: qty 1

## 2012-10-20 MED ORDER — LACTATED RINGERS IV SOLN
INTRAVENOUS | Status: DC
  Administered 2012-10-20: 125 mL/h via INTRAVENOUS
  Administered 2012-10-20 (×2): via INTRAVENOUS

## 2012-10-20 MED ORDER — SODIUM BICARBONATE 8.4 % IV SOLN
INTRAVENOUS | Status: AC
  Filled 2012-10-20: qty 50

## 2012-10-20 MED ORDER — LIDOCAINE HCL (CARDIAC) 20 MG/ML IV SOLN
INTRAVENOUS | Status: AC
  Filled 2012-10-20: qty 5

## 2012-10-20 MED ORDER — DIPHENHYDRAMINE HCL 50 MG/ML IJ SOLN: 25.0000 mg | INTRAMUSCULAR | Status: DC | PRN

## 2012-10-20 MED ORDER — LIDOCAINE-EPINEPHRINE (PF) 2 %-1:200000 IJ SOLN
INTRAMUSCULAR | Status: AC
  Filled 2012-10-20: qty 20

## 2012-10-20 MED ORDER — DIPHENHYDRAMINE HCL 25 MG PO CAPS: 25.0000 mg | ORAL_CAPSULE | ORAL | Status: DC | PRN

## 2012-10-20 MED ORDER — CLINDAMYCIN PHOSPHATE 900 MG/50ML IV SOLN
900.0000 mg | Freq: Once | INTRAVENOUS | Status: DC
  Filled 2012-10-20: qty 50

## 2012-10-20 MED ORDER — PROPOFOL 10 MG/ML IV EMUL
INTRAVENOUS | Status: AC
  Filled 2012-10-20: qty 20

## 2012-10-20 MED ORDER — AMITRIPTYLINE HCL 10 MG PO TABS
10.0000 mg | ORAL_TABLET | Freq: Every day | ORAL | Status: DC
  Administered 2012-10-20: 10 mg via ORAL
  Filled 2012-10-20: qty 1

## 2012-10-20 MED ORDER — PROPAFENONE HCL ER 225 MG PO CP12
225.0000 mg | ORAL_CAPSULE | Freq: Two times a day (BID) | ORAL | Status: DC
  Administered 2012-10-20: 225 mg via ORAL
  Filled 2012-10-20 (×2): qty 1

## 2012-10-20 MED ORDER — DEXAMETHASONE SODIUM PHOSPHATE 10 MG/ML IJ SOLN
INTRAMUSCULAR | Status: DC | PRN
  Administered 2012-10-20: 10 mg via INTRAVENOUS

## 2012-10-20 MED ORDER — FENTANYL CITRATE 0.05 MG/ML IJ SOLN: 25.0000 ug | INTRAMUSCULAR | Status: DC | PRN

## 2012-10-20 MED ORDER — NALBUPHINE SYRINGE 5 MG/0.5 ML
5.0000 mg | INJECTION | INTRAMUSCULAR | Status: DC | PRN
  Filled 2012-10-20: qty 1

## 2012-10-20 MED ORDER — ONDANSETRON HCL 4 MG/2ML IJ SOLN
INTRAMUSCULAR | Status: DC | PRN
  Administered 2012-10-20: 4 mg via INTRAVENOUS

## 2012-10-20 MED ORDER — LISINOPRIL 20 MG PO TABS
20.0000 mg | ORAL_TABLET | Freq: Every day | ORAL | Status: DC
  Filled 2012-10-20: qty 1

## 2012-10-20 MED ORDER — KETOROLAC TROMETHAMINE 30 MG/ML IJ SOLN: 30.0000 mg | Freq: Four times a day (QID) | INTRAMUSCULAR | Status: DC | PRN

## 2012-10-20 MED ORDER — MORPHINE SULFATE (PF) 0.5 MG/ML IJ SOLN
INTRAMUSCULAR | Status: DC | PRN
  Administered 2012-10-20: .1 mg via INTRATHECAL

## 2012-10-20 MED ORDER — KETOROLAC TROMETHAMINE 60 MG/2ML IM SOLN
INTRAMUSCULAR | Status: AC
  Administered 2012-10-20: 60 mg via INTRAMUSCULAR
  Filled 2012-10-20: qty 2

## 2012-10-20 MED ORDER — METOCLOPRAMIDE HCL 5 MG/ML IJ SOLN: 10.0000 mg | Freq: Three times a day (TID) | INTRAMUSCULAR | Status: DC | PRN

## 2012-10-20 MED ORDER — APIXABAN 5 MG PO TABS: 5.0000 mg | ORAL_TABLET | Freq: Two times a day (BID) | ORAL | Status: DC

## 2012-10-20 MED ORDER — ESTRADIOL 0.1 MG/GM VA CREA
TOPICAL_CREAM | VAGINAL | Status: AC
  Filled 2012-10-20: qty 42.5

## 2012-10-20 MED ORDER — KETOROLAC TROMETHAMINE 60 MG/2ML IM SOLN
60.0000 mg | Freq: Once | INTRAMUSCULAR | Status: AC | PRN
  Administered 2012-10-20: 60 mg via INTRAMUSCULAR

## 2012-10-20 MED ORDER — ONDANSETRON HCL 4 MG PO TABS: 4.0000 mg | ORAL_TABLET | Freq: Three times a day (TID) | ORAL | Status: DC | PRN

## 2012-10-20 MED ORDER — NALOXONE HCL 1 MG/ML IJ SOLN
1.0000 ug/kg/h | INTRAVENOUS | Status: DC | PRN
  Filled 2012-10-20: qty 2

## 2012-10-20 MED ORDER — MENTHOL 3 MG MT LOZG: 1.0000 | LOZENGE | OROMUCOSAL | Status: DC | PRN

## 2012-10-20 MED ORDER — FENTANYL CITRATE 0.05 MG/ML IJ SOLN
INTRAMUSCULAR | Status: DC | PRN
  Administered 2012-10-20 (×2): 50 ug via INTRAVENOUS
  Administered 2012-10-20: 15 ug via INTRATHECAL

## 2012-10-20 MED ORDER — DILTIAZEM HCL ER COATED BEADS 120 MG PO CP24
120.0000 mg | ORAL_CAPSULE | Freq: Every day | ORAL | Status: DC
  Filled 2012-10-20: qty 1

## 2012-10-20 MED ORDER — MIDAZOLAM HCL 5 MG/5ML IJ SOLN
INTRAMUSCULAR | Status: DC | PRN
  Administered 2012-10-20 (×2): 0.5 mg via INTRAVENOUS
  Administered 2012-10-20: 1 mg via INTRAVENOUS

## 2012-10-20 MED ORDER — FENTANYL CITRATE 0.05 MG/ML IJ SOLN
INTRAMUSCULAR | Status: AC
  Filled 2012-10-20: qty 4

## 2012-10-20 MED ORDER — MORPHINE SULFATE 0.5 MG/ML IJ SOLN
INTRAMUSCULAR | Status: AC
  Filled 2012-10-20: qty 10

## 2012-10-20 MED ORDER — ONDANSETRON HCL 4 MG/2ML IJ SOLN: 4.0000 mg | Freq: Three times a day (TID) | INTRAMUSCULAR | Status: DC | PRN

## 2012-10-20 MED ORDER — SODIUM CHLORIDE 0.9 % IJ SOLN: 3.0000 mL | INTRAMUSCULAR | Status: DC | PRN

## 2012-10-20 MED ORDER — BUPIVACAINE IN DEXTROSE 0.75-8.25 % IT SOLN
INTRATHECAL | Status: DC | PRN
  Administered 2012-10-20: 1.1 mL via INTRATHECAL

## 2012-10-20 MED ORDER — MIDAZOLAM HCL 2 MG/2ML IJ SOLN
INTRAMUSCULAR | Status: AC
  Filled 2012-10-20: qty 2

## 2012-10-20 MED ORDER — NALOXONE HCL 0.4 MG/ML IJ SOLN: 0.4000 mg | INTRAMUSCULAR | Status: DC | PRN

## 2012-10-20 MED ORDER — LIDOCAINE-EPINEPHRINE (PF) 1 %-1:200000 IJ SOLN
INTRAMUSCULAR | Status: AC
  Filled 2012-10-20: qty 10

## 2012-10-20 MED ORDER — PROPOFOL INFUSION 10 MG/ML OPTIME
INTRAVENOUS | Status: DC | PRN
  Administered 2012-10-20: 50 ug/kg/min via INTRAVENOUS

## 2012-10-20 MED ORDER — OXYCODONE-ACETAMINOPHEN 5-325 MG PO TABS
1.0000 | ORAL_TABLET | ORAL | Status: DC | PRN
  Administered 2012-10-20: 1 via ORAL
  Filled 2012-10-20: qty 1

## 2012-10-20 MED ORDER — PROPOFOL 10 MG/ML IV EMUL
INTRAVENOUS | Status: AC
  Filled 2012-10-20: qty 40

## 2012-10-20 MED ORDER — IBUPROFEN 600 MG PO TABS
600.0000 mg | ORAL_TABLET | Freq: Four times a day (QID) | ORAL | Status: DC | PRN
  Administered 2012-10-20: 600 mg via ORAL
  Filled 2012-10-20: qty 1

## 2012-10-20 MED ORDER — SCOPOLAMINE 1 MG/3DAYS TD PT72
1.0000 | MEDICATED_PATCH | Freq: Once | TRANSDERMAL | Status: DC
  Administered 2012-10-20: 1.5 mg via TRANSDERMAL

## 2012-10-20 MED ORDER — CLINDAMYCIN PHOSPHATE 900 MG/50ML IV SOLN
INTRAVENOUS | Status: DC | PRN
  Administered 2012-10-20: 900 mg via INTRAVENOUS

## 2012-10-20 MED ORDER — LACTATED RINGERS IV SOLN
INTRAVENOUS | Status: DC
  Administered 2012-10-20 – 2012-10-21 (×2): via INTRAVENOUS

## 2012-10-20 MED ORDER — DIPHENHYDRAMINE HCL 50 MG/ML IJ SOLN: 12.5000 mg | INTRAMUSCULAR | Status: DC | PRN

## 2012-10-20 MED ORDER — DOCUSATE SODIUM 100 MG PO CAPS
100.0000 mg | ORAL_CAPSULE | Freq: Three times a day (TID) | ORAL | Status: DC
  Administered 2012-10-20: 100 mg via ORAL
  Filled 2012-10-20: qty 1

## 2012-10-20 MED ORDER — MEPERIDINE HCL 25 MG/ML IJ SOLN: 6.2500 mg | INTRAMUSCULAR | Status: DC | PRN

## 2012-10-20 MED ORDER — SODIUM BICARBONATE 8.4 % IV SOLN
INTRAVENOUS | Status: DC | PRN
  Administered 2012-10-20: 3 mL via EPIDURAL

## 2012-10-20 MED ORDER — LIDOCAINE-EPINEPHRINE (PF) 1 %-1:200000 IJ SOLN
INTRAMUSCULAR | Status: DC | PRN
  Administered 2012-10-20 (×3): 10 mL

## 2012-10-20 MED ORDER — ONDANSETRON HCL 4 MG/2ML IJ SOLN
INTRAMUSCULAR | Status: AC
  Filled 2012-10-20: qty 2

## 2012-10-20 SURGICAL SUPPLY — 25 items
CATH FOLEY 2WAY SLVR  5CC 12FR (CATHETERS) ×1
CATH FOLEY 2WAY SLVR 5CC 12FR (CATHETERS) IMPLANT
CATH FOLEY LATEX FREE 12 FR (CATHETERS) ×2
CATH FOLEY LF 12 FR (CATHETERS) IMPLANT
CLOTH BEACON ORANGE TIMEOUT ST (SAFETY) ×2 IMPLANT
DECANTER SPIKE VIAL GLASS SM (MISCELLANEOUS) ×1 IMPLANT
DISSECTOR SPONGE CHERRY (GAUZE/BANDAGES/DRESSINGS) ×1 IMPLANT
GAUZE SPONGE 4X4 16PLY XRAY LF (GAUZE/BANDAGES/DRESSINGS) ×1 IMPLANT
GLOVE BIOGEL PI IND STRL 7.0 (GLOVE) ×2 IMPLANT
GLOVE BIOGEL PI INDICATOR 7.0 (GLOVE) ×2
GLOVE ECLIPSE 6.5 STRL STRAW (GLOVE) ×4 IMPLANT
GOWN STRL REIN XL XLG (GOWN DISPOSABLE) ×8 IMPLANT
NS IRRIG 1000ML POUR BTL (IV SOLUTION) ×2 IMPLANT
PACK VAGINAL WOMENS (CUSTOM PROCEDURE TRAY) ×2 IMPLANT
SUT VIC AB 0 CT1 27 (SUTURE) ×2
SUT VIC AB 0 CT1 27XBRD ANBCTR (SUTURE) IMPLANT
SUT VIC AB 0 CT1 36 (SUTURE) ×1 IMPLANT
SUT VIC AB 2-0 CT2 27 (SUTURE) ×4 IMPLANT
SUT VIC AB 3-0 CT1 27 (SUTURE) ×4
SUT VIC AB 3-0 CT1 TAPERPNT 27 (SUTURE) IMPLANT
SUT VIC AB 3-0 SH 27 (SUTURE) ×4
SUT VIC AB 3-0 SH 27XBRD (SUTURE) ×2 IMPLANT
TOWEL OR 17X24 6PK STRL BLUE (TOWEL DISPOSABLE) ×4 IMPLANT
TRAY FOLEY CATH 14FR (SET/KITS/TRAYS/PACK) ×2 IMPLANT
WATER STERILE IRR 1000ML POUR (IV SOLUTION) ×2 IMPLANT

## 2012-10-20 NOTE — Interval H&P Note (Signed)
History and Physical Interval Note:  10/20/2012 7:25 AM  Heather [name]  has presented today for surgery, with the diagnosis of Pelvic Prolapseg  The various methods of treatment have been discussed with the patient and family. After consideration of risks, benefits and other options for treatment, the patient has consented to  Procedure(s) (LRB) with comments: ANTERIOR (CYSTOCELE) AND POSTERIOR REPAIR (RECTOCELE) (N/A) - 2 hours as a surgical intervention .  The patient's history has been reviewed, patient examined, no change in status, stable for surgery.  I have reviewed the patient's chart and labs.  Questions were answered to the patient's satisfaction.     Sudie Bandel A

## 2012-10-20 NOTE — Transfer of Care (Signed)
Immediate Anesthesia Transfer of Care Note  Patient: Heather [name]  Procedure(s) Performed: Procedure(s) (LRB) with comments: ANTERIOR (CYSTOCELE) AND POSTERIOR REPAIR (RECTOCELE) (N/A) - 2 hours  Patient Location: PACU  Anesthesia Type:Spinal and Epidural  Level of Consciousness: awake, alert  and oriented  Airway & Oxygen Therapy: Patient Spontanous Breathing  Post-op Assessment: Report given to PACU RN and Post -op Vital signs reviewed and stable  Post vital signs: Reviewed and stable  Complications: No apparent anesthesia complications

## 2012-10-20 NOTE — Addendum Note (Signed)
Addendum  created 10/20/12 1348 by Tri Chittick O Johntavious Francom, CRNA   Modules edited:Anesthesia LDA    

## 2012-10-20 NOTE — Anesthesia Preprocedure Evaluation (Signed)
Anesthesia Evaluation  Patient identified by MRN, date of birth, ID band Patient awake    Reviewed: Allergy & Precautions, H&P , NPO status , Patient's Chart, lab work & pertinent test results, reviewed documented beta blocker date and time   History of Anesthesia Complications Negative for: history of anesthetic complications  Airway Mallampati: III TM Distance: >3 FB Neck ROM: full    Dental  (+) Teeth Intact   Pulmonary sleep apnea and Continuous Positive Airway Pressure Ventilation ,  breath sounds clear to auscultation  Pulmonary exam normal       Cardiovascular Exercise Tolerance: Good hypertension, On Medications + dysrhythmias (s/p cardioversion 2x and ablation 2x .  chronically anticoagulated, but off eliquis x 60 hours) Atrial Fibrillation Rhythm:irregular Rate:Normal     Neuro/Psych S/p lumbar surgery (L4-5) negative psych ROS   GI/Hepatic negative GI ROS, Neg liver ROS,   Endo/Other  Morbid obesity  Renal/GU negative Renal ROS Bladder dysfunction      Musculoskeletal   Abdominal   Peds  Hematology negative hematology ROS (+)   Anesthesia Other Findings   Reproductive/Obstetrics negative OB ROS                           Anesthesia Physical Anesthesia Plan  ASA: III  Anesthesia Plan: Spinal   Post-op Pain Management:    Induction:   Airway Management Planned:   Additional Equipment:   Intra-op Plan:   Post-operative Plan:   Informed Consent: I have reviewed the patients History and Physical, chart, labs and discussed the procedure including the risks, benefits and alternatives for the proposed anesthesia with the patient or authorized representative who has indicated his/her understanding and acceptance.   Dental Advisory Given  Plan Discussed with: CRNA and Surgeon  Anesthesia Plan Comments:         Anesthesia Quick Evaluation

## 2012-10-20 NOTE — Addendum Note (Signed)
Addendum  created 10/20/12 1348 by Graciela Husbands, CRNA   Modules edited:Anesthesia LDA

## 2012-10-20 NOTE — Anesthesia Procedure Notes (Addendum)
Spinal  Patient location during procedure: OR Start time: 10/20/2012 9:05 AM Staffing Performed by: anesthesiologist  Preanesthetic Checklist Completed: patient identified, site marked, surgical consent, pre-op evaluation, timeout performed, IV checked, risks and benefits discussed and monitors and equipment checked Spinal Block Patient position: sitting Prep: site prepped and draped and DuraPrep Patient monitoring: cardiac monitor, continuous pulse ox, blood pressure and heart rate Approach: midline Location: L3-4 Injection technique: catheter Needle Needle type: Tuohy and Sprotte  Needle gauge: 24 G Needle length: 12.7 cm Needle insertion depth: 8 cm Catheter type: closed end flexible Catheter size: 19 g Catheter at skin depth: 13 cm Assessment Sensory level: T10 Additional Notes Placement somewhat difficult.  Initial attempts with sprotte and then pencan were unsuccessful.  Switched to [employer] with tuohy and 25 ga Pencan.  LOR to air at 8 cm, 25 ga Pencan passed via tuohy with immediate return of clear free flow CSF.  SAB dose given, Pencan removed, and epidural catheter threaded without flushing tuohy.  Epidural catheter NOT test dosed.  Patient left sitting to allow for saddle block.  Patient tolerated procedure well, no apparent complications.  Jasmine December, MD

## 2012-10-20 NOTE — Op Note (Signed)
Preoperative diagnosis: Cystocele and rectocele  Postoperative diagnosis: large enterocele  Anesthesia: Spinal  Anesthesiologist: Dr. Dana Allan  Procedure: Cure of enterocele, posterior repair and perineoplasty  Surgeon: Dr. Dois Davenport Reata Petrov  Assistant: Henreitta Leber P.A.-C.  Estimated blood loss: 50 cc  Procedure:  After being informed of the planned procedure with possible complications including but not limited to bleeding, infection, injury to bowel, rectum or bladder, recurrent pelvic prolapse, expected hospitals they in recovery time, informed consent is obtained and patient is taken to or #3 and given spinal anesthesia without complication. She is then placed in the lithotomy position prepped and draped in a sterile fashion with knee-high sequential compressive devices. A Foley catheter is placed in her bladder.  Pelvic exam reveals no cystocele with a small rectocele but a very large enterocele. The posterior fourchette is grasped with 2 Allis forceps with a distance of 3 cm in between. We proceed with infiltration of the perineum and posterior vaginal mucosa using a total of 30 cc of 1% lidocaine with epinephrine 1 in 200,000. We perform a V-incision at the fourchette and the perineum. Excess perineal skin is excised. This gives [place] access to the posterior vaginal mucosa which is then sharply and bluntly dissected away from the prerectal fascia in the midline all the way to the vaginal cuff. We rapidly note the rectal margin at 2 cm from the fourchette. The rest of the hernia is then visible a very large. Attempt is made to open the hernia sac that does not succeed. The hernia sac is then sharply and bluntly dissected away and freed completely. This leaves [place] with a 5 cm completely dissected hernia sac. Using a Foley catheter with its balloon inflated with 25 cc of saline, we reduced the hernia and proceed with a pursestring suture reunite in the vaginal cuff and the prerectal fascia  closing around the hernia sac. This is achieved with a on 2-0 Vicryl. The Foley balloon is then deflated and removed leaving [place] with a reduced hernia sac. We reinforce that pursestring suture with the second one.  We then proceed with correction of the rectocele plicating the prerectal fascia using U. stitches of 2-0 Vicryl until complete correction. The excess vaginal mucosa is then excised. The posterior vaginal mucosa is closed with a running lock suture of 3-0 Vicryl. The perineal muscles were then reapproximated with 3 simple sutures of 3-0 Vicryl. Peritoneal skin is closed with subcuticular suture of 3-0 Vicryl.  Vaginal length is then estimated at 6-8 cm.  Instrument and sponge count is complete x2. Estimated blood loss is 50 cc. The procedure is well tolerated by the patient is taken to recovery room in a well and stable condition.  Specimen: None

## 2012-10-20 NOTE — H&P (View-Only) (Signed)
Heather [name] is a 61 y.o. female G3P3000 who is S/P hysterectomy, presents for an anterior/posterior colporrhaphy because of pelvic prolapse. For the past several years the patient has noticed worsening pelvic prolapse-(felt with wiping, along with urinary frequency but no urgency, incontinence or difficulty with bowel movements. She has used vaginal estradiol without any noticeable changes in her symptoms and though she was given medical management options, she desires to proceed with anterior/posterior colporrhaphy.   Past Medical History   OB History: G3P3000 SVD, 1973, 1976 and 1980   GYN History: menarche: 62 YO; Contracepton hysterectomy The patient denies history of sexually transmitted disease. 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   Surgical History: Hysterectomy (2000), Carpal Tunnel Release (bilateral), Lumbar fusion (2004), Hip Surgeries (Bilateral 2009 & 2011), Laparoscopic Gastric Band, Cardiac Ablation, Removal of right Morton's Neuroma, Tonsillectomy & Adenoidectomy, Tubal Sterilzation, and D & C.   Denies problems with anesthesia but does have a history of blood transfusions x 2 with both hip surgeries   Family History: Atrial Fibrillation, diabetes mellitus, heart disease and hypertension.   Social History: Married and is retired from [employer]; Denies alcohol, tobacco or illicit drug use   Outpatient Encounter Prescriptions as of 09/24/2012  Medication Sig Dispense Refill  . amitriptyline (ELAVIL) 10 MG tablet Take 10 mg by mouth at bedtime.  . Cholecalciferol (VITAMIN D3) 5000 UNITS TABS Take 1 tablet by mouth See admin instructions. Take 1 tablet by mouth daily on Monday, Tuesday, Wednesday, Thursday, Friday.  .  diltiazem (CARDIZEM CD) 120 MG 24 hr capsule Take 1 capsule (120 mg total) by mouth daily.  . ELIQUIS 5 MG TABS tablet TAKE 1 TABLET BY MOUTH TWICE A DAY 60 tablet 1  . estradiol (ESTRACE VAGINAL) 0.1 MG/GM vaginal cream One applicator per vagina 3 times a week. 42.5 g 6  . fexofenadine (ALLEGRA) 180 MG tablet Take 180 mg by mouth daily.  . lisinopril (PRINIVIL,ZESTRIL) 20 MG tablet TAKE 1 TABLET EVERY DAY 30 tablet 5  . propafenone (RYTHMOL SR) 225 MG 12 hr capsule Take 1 capsule (225 mg total) by mouth every 12 (twelve) hours. 60 capsule 4  . vitamin C (ASCORBIC ACID) 500 MG tablet Take 500 mg by mouth daily.   Allergies  Allergen Reactions  . Cephalexin Hives and Other (See Comments)  "throat started closing up"  . Rocephin (Ceftriaxone Sodium In Dextrose) Anaphylaxis  . Penicillins Rash  . Flecainide Acetate Other (See Comments)  "couldn't take it"  . Moxifloxacin   Does not remember the reaction  . Sulfonamide Derivatives  Does not remember reaction ; "was so young when I had reaction to it"   Denies sensitivity to peanuts, shellfish, soy, or latex. Admits to adhesives causing itching and rash.   ROS: Denies headache, vision changes, nasal congestion, dysphagia, tinnitus, dizziness, hoarseness, cough, chest pain, shortness of breath, nausea, vomiting, diarrhea,constipation, urgency dysuria, hematuria, vaginitis symptoms, pelvic pain, swelling of joints,easy bruising, myalgias, arthralgias, skin rashes, unexplained weight loss and except as is mentioned in the history of present illness, patient's review of systems is otherwise negative.   Physical Exam   BP 132/80  Pulse 68  Temp 98 F (36.7 C) (Oral)  Resp 16  Ht 5' 4" (1.626 m)  Wt 284 lb (128.822 kg)  BMI 48.75 kg/m2     Neck: supple without masses or thyromegaly  Lungs: clear to auscultation  Heart: regular rate and rhythm  Abdomen: soft, non-tender and no organomegaly  Pelvic:EGBUS- wnl; vagina with visible prolapse  at inner vaginal opening, no lesions, cystocele 3/4 with 4/4 rectocele; uterus/cervix-surgically absent, adnexae-no tenderness or masses  Extremities: no clubbing, cyanosis or edema   Assesment: Pelvic Prolapse   Disposition: A discussion was held with patient regarding the indication for her procedure(s) along with the risks, which include but are not limited to: reaction to anesthesia, damage to adjacent organs, infection, worsening of symptoms and excessive bleeding. Patient verbalized understanding of these risks and has consented to proceed with Anterior/Posterior Colporrhaphy at [facility], October 20, 2012 at 7:30 a.m.   CSN# [id number]  Toluwani Ruder J. Jess Sulak, PA-C for Dr. Sandra A. Rivard   

## 2012-10-20 NOTE — Anesthesia Postprocedure Evaluation (Signed)
Anesthesia Post Note  Patient: Heather [name]  Procedure(s) Performed: Procedure(s) (LRB): ANTERIOR (CYSTOCELE) AND POSTERIOR REPAIR (RECTOCELE) (N/A)  Anesthesia type: Spinal  Patient location: PACU  Post pain: Pain level controlled  Post assessment: Post-op Vital signs reviewed  Last Vitals:  Filed Vitals:   10/20/12 1315  BP: 119/78  Pulse: 69  Temp:   Resp: 18    Post vital signs: Reviewed  Level of consciousness: awake  Complications: No apparent anesthesia complications

## 2012-10-20 NOTE — Telephone Encounter (Signed)
Refilled Eliquis.

## 2012-10-20 NOTE — H&P (Signed)
Heather [name] is a 62 y.o. female G3P3000 who is S/P hysterectomy, presents for an anterior/posterior colporrhaphy because of pelvic prolapse. For the past several years the patient has noticed worsening pelvic prolapse-(felt with wiping, along with urinary frequency but no urgency, incontinence or difficulty with bowel movements. She has used vaginal estradiol without any noticeable changes in her symptoms and though she was given medical management options, she desires to proceed with anterior/posterior colporrhaphy.   Past Medical History   OB History: G3P3000 SVD, 70, 1976 and 1980   GYN History: menarche: 62 YO; Contracepton hysterectomy The patient denies history of sexually transmitted disease. 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   Surgical History: Hysterectomy (2000), Carpal Tunnel Release (bilateral), Lumbar fusion (2004), Hip Surgeries (Bilateral 2009 & 2011), Laparoscopic Gastric Band, Cardiac Ablation, Removal of right Morton's Neuroma, Tonsillectomy & Adenoidectomy, Tubal Sterilzation, and D & C.   Denies problems with anesthesia but does have a history of blood transfusions x 2 with both hip surgeries   Family History: Atrial Fibrillation, diabetes mellitus, heart disease and hypertension.   Social History: Married and is retired from [employer]; Denies alcohol, tobacco or illicit drug use   Outpatient Encounter Prescriptions as of 09/24/2012  Medication Sig Dispense Refill  . amitriptyline (ELAVIL) 10 MG tablet Take 10 mg by mouth at bedtime.  . Cholecalciferol (VITAMIN D3) 5000 UNITS TABS Take 1 tablet by mouth See admin instructions. Take 1 tablet by mouth daily on Monday, Tuesday, Wednesday, Thursday, Friday.  .  diltiazem (CARDIZEM CD) 120 MG 24 hr capsule Take 1 capsule (120 mg total) by mouth daily.  Marland Kitchen ELIQUIS 5 MG TABS tablet TAKE 1 TABLET BY MOUTH TWICE A DAY 60 tablet 1  . estradiol (ESTRACE VAGINAL) 0.1 MG/GM vaginal cream One applicator per vagina 3 times a week. 42.5 g 6  . fexofenadine (ALLEGRA) 180 MG tablet Take 180 mg by mouth daily.  Marland Kitchen lisinopril (PRINIVIL,ZESTRIL) 20 MG tablet TAKE 1 TABLET EVERY DAY 30 tablet 5  . propafenone (RYTHMOL SR) 225 MG 12 hr capsule Take 1 capsule (225 mg total) by mouth every 12 (twelve) hours. 60 capsule 4  . vitamin C (ASCORBIC ACID) 500 MG tablet Take 500 mg by mouth daily.   Allergies  Allergen Reactions  . Cephalexin Hives and Other (See Comments)  "throat started closing up"  . Rocephin (Ceftriaxone Sodium In Dextrose) Anaphylaxis  . Penicillins Rash  . Flecainide Acetate Other (See Comments)  "couldn't take it"  . Moxifloxacin   Does not remember the reaction  . Sulfonamide Derivatives  Does not remember reaction ; "was so young when I had reaction to it"   Denies sensitivity to peanuts, shellfish, soy, or latex. Admits to adhesives causing itching and rash.   ROS: Denies headache, vision changes, nasal congestion, dysphagia, tinnitus, dizziness, hoarseness, cough, chest pain, shortness of breath, nausea, vomiting, diarrhea,constipation, urgency dysuria, hematuria, vaginitis symptoms, pelvic pain, swelling of joints,easy bruising, myalgias, arthralgias, skin rashes, unexplained weight loss and except as is mentioned in the history of present illness, patient's review of systems is otherwise negative.   Physical Exam   BP 132/80  Pulse 68  Temp 98 F (36.7 C) (Oral)  Resp 16  Ht 5\' 4"  (1.626 m)  Wt 284 lb (128.822 kg)  BMI 48.75 kg/m2  Neck: supple without masses or thyromegaly  Lungs: clear to auscultation  Heart: regular rate and rhythm  Abdomen: soft, non-tender and no organomegaly  Pelvic:EGBUS- wnl; vagina with visible prolapse  at inner vaginal opening, no lesions, cystocele 3/4 with 4/4 rectocele; uterus/cervix-surgically absent, adnexae-no tenderness or masses  Extremities: no clubbing, cyanosis or edema   Assesment: Pelvic Prolapse   Disposition: A discussion was held with patient regarding the indication for her procedure(s) along with the risks, which include but are not limited to: reaction to anesthesia, damage to adjacent organs, infection, worsening of symptoms and excessive bleeding. Patient verbalized understanding of these risks and has consented to proceed with Anterior/Posterior Colporrhaphy at [facility] [place], October 20, 2012 at 7:30 a.m.   CSN# [id number]  Agusta Hackenberg J. Lowell Guitar, PA-C for Dr. Crist Fat. Rivard

## 2012-10-21 ENCOUNTER — Encounter (HOSPITAL_COMMUNITY): Payer: Self-pay | Admitting: Obstetrics and Gynecology

## 2012-10-21 MED ORDER — OXYCODONE-ACETAMINOPHEN 5-325 MG PO TABS: 1.0000 | ORAL_TABLET | ORAL | Status: DC | PRN

## 2012-10-21 MED ORDER — ONDANSETRON HCL 4 MG PO TABS: 4.0000 mg | ORAL_TABLET | Freq: Three times a day (TID) | ORAL | Status: DC | PRN

## 2012-10-21 MED ORDER — IBUPROFEN 600 MG PO TABS: 600.0000 mg | ORAL_TABLET | Freq: Four times a day (QID) | ORAL | Status: DC | PRN

## 2012-10-21 MED ORDER — DSS 100 MG PO CAPS: 100.0000 mg | ORAL_CAPSULE | Freq: Three times a day (TID) | ORAL | Status: DC

## 2012-10-21 NOTE — Progress Notes (Signed)
Pt is discharged in the care of husband. Downstairs per ambulatory Stable Denies any pain or discomfort. All discharge instruction were given with good comprehension Question s were asked and answered. Scnty amt of drainage noted on V- pad.

## 2012-10-21 NOTE — Addendum Note (Signed)
Addendum  created 10/21/12 0747 by Renford Dills, CRNA   Modules edited:Notes Section

## 2012-10-21 NOTE — Progress Notes (Signed)
Heather [name] is a61 y.o.  [id number]  Post Op Date # 1,  S/P Repair of Enterocele  Subjective: Patient is Doing well postoperatively. Patient has The patient is not having any pain., ambulating in halls without difficulty, tolerating a regular diet but has not voided yet (since Foley was removed).  Objective: Vital signs in last 24 hours: Temp:  [97 F (36.1 C)-98.4 F (36.9 C)] 97.7 F (36.5 C) (01/09 0526) Pulse Rate:  [63-92] 73  (01/09 0526) Resp:  [15-20] 18  (01/09 0530) BP: (107-149)/(54-87) 124/64 mmHg (01/09 0526) SpO2:  [91 %-100 %] 94 % (01/09 0526) Weight:  [298 lb (135.172 kg)] 298 lb (135.172 kg) (01/08 1415)  Intake/Output from previous day: 01/08 0701 - 01/09 0700 In: 5173.3 [P.O.:1060; I.V.:4113.3] Out: 2475 [Urine:2375] Intake/Output this shift: Total I/O In: 2733.3 [P.O.:820; I.V.:1913.3] Out: 1100 [Urine:1100]  Lab 10/18/12 1215  WBC 7.4  HGB 12.3  HCT 39.3  PLT 242     Lab 10/18/12 1215  NA 137  K 4.3  CL 99  CO2 27  BUN 26*  CREATININE 0.84  CALCIUM 9.9  PROT 7.4  BILITOT 0.4  ALKPHOS 110  ALT 22  AST 18  GLUCOSE 96    EXAM: General: alert and cooperative Resp: clear to auscultation bilaterally Cardio: regularly irregular rhythm and no murmur GI: soft, non-tender; bowel sounds normal; no masses,  no organomegaly Extremities: Homans sign is negative, no sign of DVT   Assessment: s/p Procedure(s): ANTERIOR (CYSTOCELE) AND POSTERIOR REPAIR (RECTOCELE): stable and progressing well  Plan: Discharge home Once patient voids appropriately.  LOS: 1 day    Porter Moes, PA-C 10/21/2012 6:28 AM

## 2012-10-21 NOTE — Anesthesia Postprocedure Evaluation (Signed)
  Anesthesia Post-op Note  Patient: Heather [name]  Procedure(s) Performed: Procedure(s) (LRB) with comments: ANTERIOR (CYSTOCELE) AND POSTERIOR REPAIR (RECTOCELE) (N/A) - 2 hours  Patient Location: [facility]  Anesthesia Type:Regional  Level of Consciousness: awake  Airway and Oxygen Therapy: Patient Spontanous Breathing  Post-op Pain: mild  Post-op Assessment: Patient's Cardiovascular Status Stable and Respiratory Function Stable  Post-op Vital Signs: stable  Complications: No apparent anesthesia complications

## 2012-10-21 NOTE — Discharge Summary (Signed)
  Physician Discharge Summary  Patient ID: Heather [name] MRN: [id number] DOB/AGE: Jul 06, 1951 62 y.o.  Admit date: 10/20/2012 Discharge date: 10/21/2012   Discharge Diagnoses: Pelvic Prolapse Active Problems:  * No active hospital problems. *    Operation: Repair of enterocele, posterior colporrhaphy and perineoplasty.  Discharged Condition: Good  Hospital Course: On the date of admission the patient underwent the aforementioned procedure, tolerating it well.  By post operative day #1, the patient had resumed bowel and bladder function and was therefore deemed ready for discharge home.  Disposition: 01-Home or Self Care  Discharge Medications:   Jette, Lewan  Home Medication Instructions [id number]   Printed on:10/21/12 0651  Medication Information                    amitriptyline (ELAVIL) 10 MG tablet Take 10 mg by mouth at bedtime.           vitamin C (ASCORBIC ACID) 500 MG tablet Take 500 mg by mouth daily.           fexofenadine (ALLEGRA) 180 MG tablet Take 180 mg by mouth daily.           Cholecalciferol (VITAMIN D3) 5000 UNITS TABS Take 1 tablet by mouth See admin instructions. Take 1 tablet by mouth daily on Monday, Tuesday, Wednesday, Thursday, Friday.           lisinopril (PRINIVIL,ZESTRIL) 20 MG tablet TAKE 1 TABLET EVERY DAY           propafenone (RYTHMOL SR) 225 MG 12 hr capsule Take 1 capsule (225 mg total) by mouth every 12 (twelve) hours.           estradiol (ESTRACE VAGINAL) 0.1 MG/GM vaginal cream One applicator per vagina 3 times a week.           diltiazem (CARDIZEM CD) 120 MG 24 hr capsule TAKE ONE CAPSULE EVERY DAY           guaiFENesin (MUCINEX) 600 MG 12 hr tablet Take 600 mg by mouth 2 (two) times daily. For allergies.           meloxicam (MOBIC) 7.5 MG tablet Take 7.5 mg by mouth 2 (two) times daily. For knee pain.           apixaban (ELIQUIS) 5 MG TABS tablet Take 1 tablet (5 mg total) by mouth 2 (two) times daily.             docusate sodium 100 MG CAPS Take 100 mg by mouth 3 (three) times daily.           ibuprofen (ADVIL,MOTRIN) 600 MG tablet Take 1 tablet (600 mg total) by mouth every 6 (six) hours as needed for pain.           ondansetron (ZOFRAN) 4 MG tablet Take 1 tablet (4 mg total) by mouth every 8 (eight) hours as needed.           oxyCODONE-acetaminophen (PERCOCET/ROXICET) 5-325 MG per tablet Take 1-2 tablets by mouth every 4 (four) hours as needed (moderate to severe pain (when tolerating fluids)).              Follow-up: Dr. Estanislado Pandy,  December 01, 2012 at 10:40 a.m.   SignedHenreitta Leber, PA-C  10/21/2012, 6:51 AM

## 2012-10-26 ENCOUNTER — Telehealth: Payer: Self-pay | Admitting: Cardiology

## 2012-10-26 NOTE — Telephone Encounter (Signed)
Pt states she has been in at [facility] since procedure on 10/20/12. Reviewed with Dr Darvin Neighbours recommended pt come to office for EKG. I have scheduled pt with Brynda Rim 10/27/12. Pt advised.

## 2012-10-26 NOTE — Telephone Encounter (Signed)
New Problem:    Patient called in because she had a procedure on 10/20/12 and has been in [facility] ever since.  Please call back.

## 2012-10-27 ENCOUNTER — Encounter: Payer: Self-pay | Admitting: *Deleted

## 2012-10-27 ENCOUNTER — Ambulatory Visit (INDEPENDENT_AMBULATORY_CARE_PROVIDER_SITE_OTHER): Payer: BC Managed Care – PPO | Admitting: Physician Assistant

## 2012-10-27 ENCOUNTER — Other Ambulatory Visit: Payer: Self-pay | Admitting: Cardiology

## 2012-10-27 ENCOUNTER — Encounter: Payer: Self-pay | Admitting: Physician Assistant

## 2012-10-27 VITALS — BP 124/98 | HR 135 | Ht 65.0 in | Wt 286.4 lb

## 2012-10-27 DIAGNOSIS — I4892 Unspecified atrial flutter: Secondary | ICD-10-CM

## 2012-10-27 LAB — BASIC METABOLIC PANEL
BUN: 22 mg/dL (ref 6–23)
Calcium: 9.7 mg/dL (ref 8.4–10.5)
Creatinine, Ser: 0.9 mg/dL (ref 0.4–1.2)
GFR: 66.11 mL/min (ref >60.00)

## 2012-10-27 LAB — CBC WITH DIFFERENTIAL/PLATELET
Eosinophils Relative: 1.6 % (ref 0.0–5.0)
Monocytes Absolute: 0.7 10*3/uL (ref 0.1–1.0)
Monocytes Relative: 6.2 % (ref 3.0–12.0)
Neutrophils Relative %: 78.1 % — ABNORMAL HIGH (ref 43.0–77.0)
Platelets: 272 10*3/uL (ref 150.0–400.0)
WBC: 11.2 10*3/uL — ABNORMAL HIGH (ref 4.5–10.5)

## 2012-10-27 MED ORDER — METOPROLOL SUCCINATE ER 25 MG PO TB24: 25.0000 mg | ORAL_TABLET | Freq: Every day | ORAL | Status: DC

## 2012-10-27 NOTE — H&P (Signed)
History and Physical  Date:  10/27/2012   Name:  Heather [name]   DOB:  1951/04/25   MRN:  [id number]  PCP:  Leo Grosser, MD  Primary Cardiologist:  Dr. [name]  Primary Electrophysiologist:  None    History of Present Illness: Heather [name] is a 62 y.o. female who returns for evaluation of AFib.  She has a hx of morbid obesity s/p lap band surgery and atrial fibrillation s/p ablation. Patient developed atrial fibrillation in 2004. She had a cath in 2004 showing no significant CAD. She had atrial fibrillation ablation in 2005 with a redo in 2007. She has had no documented atrial fibrillation since 2007 until an appointment early 2013. Heather [name] was noted to be in coarse atrial fibrillation with HR in the 90s.  She was started on apixaban and Toprol XL 25 mg daily. The atrial fibrillation was persistent and seemed to be causing increased fatigue. Dr. Shirlee Latch planned to start her on dofetilide, but her QT interval was too long. Instead, she was started on propafenone and cardioverted her to NSR in 6/13. In 9/13, she was in atrial flutter.  Her propafenone was increased to 325 mg bid and she was cardioverted to NSR. Lexiscan myoview done given use of Ic agent showed soft tissue attenuation but no evidence for ischemia or infarction in 6/13. She had increased dyspnea with propafenone at 325 mg bid, so it was decreased back to 225 mg bid. This helped her dyspnea. Last seen by Dr. Shirlee Latch in 12/13. Since then, she underwent surgical repair of cystocele and rectocele.  Post op course was uneventful.    Patient started back on Eliquis several days ago.  She felt her heart go out of rhythm 3 days ago.  She feels fatigued and more dyspneic.  She notes rapid palpitations.  No chest pain, orthopnea, PND, edema, syncope.    Labs (3/11):  BNP 65, K 4.2, creatinine 0.83, TSH normal, HCT 39.1  Labs (5/13):  K 3.9, creaitnine 1.0, HCT 37.9  Labs (6/13):  K 3.8, creatinine 0.85  Labs (9/13):  K 4.3,  creatinine 0.8  Labs (1/14):  K 4.3, creatinine 0.84, ALT 22, Hgb 12.3   Wt Readings from Last 3 Encounters:  10/27/12 286 lb 6.4 oz (129.91 kg)  10/21/12 298 lb (135.172 kg)  10/21/12 298 lb (135.172 kg)    Past Medical History:  1. L4/ L5 disk disease, s/p surgery  2. hypertension  3. Obesity: s/p Lap band surgery 7/09  4. Atrial fibrillation: Began in 2004. Had ablations at [facility] in 2005 and 2007. Unable to tolerate flecainide. Back in atrial fibrillation in 5/13. QT too long for dofetilide. Propafenone started with DCCV to NSR in 6/13. Atrial flutter in 9/13 with cardioversion back to NSR. Dyspnea with propafenone 325 mg bid, can tolerate 225 mg bid.  5. Left heart cath (2004): No significant CAD, false + cardiolite. EF 60%. Lexiscan myoview (6/13) with EF 60%, soft tissue attenuation noted but no ischemia or infarction.  6. Echo (3/11): LV EF 55-60%, no significant valvular dysfunction, RV-RA gradient 26 mmHg.  7. Hypothyroidism  8. 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 GERD: Resolved after lap band  Past Medical History  Diagnosis Date  . HTN (hypertension)   . Obesity   . Carpal tunnel syndrome   . Anemia     "onc;e; had to take iron for awhile"  . Degenerative disk disease   . History of blood transfusion     "  w/both hip replacements"  . IBS (irritable bowel syndrome)   . Morton's neuroma     right foot  . Atrial fibrillation     Began in 2004.  Had ablations at [facility] in 2005 and 2007.  She is off coumadin  now with no noted recurrent atrial fibrillation since 2007. til 04/01/12; s/p initiation of Rhythmol with DCCV June 2013  . Hypothyroidism     no meds currently  . Sleep apnea 04/01/12    "dx'd just last week"  . Sleep apnea     uses CPAP  . Interstitial cystitis   . GERD (gastroesophageal reflux disease)     resolved after lap band    Current Outpatient Prescriptions  Medication Sig Dispense Refill  . amitriptyline (ELAVIL) 10  MG tablet Take 10 mg by mouth at bedtime.      Marland Kitchen apixaban (ELIQUIS) 5 MG TABS tablet Take 1 tablet (5 mg total) by mouth 2 (two) times daily.  60 tablet  1  . Cholecalciferol (VITAMIN D3) 5000 UNITS TABS Take 1 tablet by mouth See admin instructions. Take 1 tablet by mouth daily on Monday, Tuesday, Wednesday, Thursday, Friday.      . diltiazem (CARDIZEM CD) 120 MG 24 hr capsule TAKE ONE CAPSULE EVERY DAY  30 capsule  6  . docusate sodium 100 MG CAPS Take 100 mg by mouth 3 (three) times daily.  100 capsule  1  . estradiol (ESTRACE VAGINAL) 0.1 MG/GM vaginal cream One applicator per vagina 3 times a week.  42.5 g  6  . fexofenadine (ALLEGRA) 180 MG tablet Take 180 mg by mouth daily.      Marland Kitchen guaiFENesin (MUCINEX) 600 MG 12 hr tablet Take 600 mg by mouth 2 (two) times daily. For allergies.      Marland Kitchen ibuprofen (ADVIL,MOTRIN) 600 MG tablet Take 1 tablet (600 mg total) by mouth every 6 (six) hours as needed for pain.  30 tablet  1  . lisinopril (PRINIVIL,ZESTRIL) 20 MG tablet TAKE 1 TABLET EVERY DAY  30 tablet  5  . meloxicam (MOBIC) 7.5 MG tablet Take 7.5 mg by mouth 2 (two) times daily. For knee pain.      Marland Kitchen ondansetron (ZOFRAN) 4 MG tablet Take 1 tablet (4 mg total) by mouth every 8 (eight) hours as needed.  20 tablet  0  . oxyCODONE-acetaminophen (PERCOCET/ROXICET) 5-325 MG per tablet Take 1-2 tablets by mouth every 4 (four) hours as needed (moderate to severe pain (when tolerating fluids)).  30 tablet  0  . propafenone (RYTHMOL SR) 225 MG 12 hr capsule Take 1 capsule (225 mg total) by mouth every 12 (twelve) hours.  60 capsule  4  . vitamin C (ASCORBIC ACID) 500 MG tablet Take 500 mg by mouth daily.        Allergies: Allergies  Allergen Reactions  . Cephalexin Hives and Other (See Comments)    "throat started closing up"  . Rocephin (Ceftriaxone Sodium In Dextrose) Anaphylaxis  . Penicillins Rash  . Flecainide Acetate Other (See Comments)    "couldn't take it"  . Moxifloxacin     Does not  remember the reaction  . Sulfonamide Derivatives     Does not remember reaction ; "was so young when I had reaction to it"    Social History:  The patient  reports that she has never smoked. She has never used smokeless tobacco. She reports that she does not drink alcohol or use illicit drugs.   ROS:  Please  see the history of present illness.      All other systems reviewed and negative.   PHYSICAL EXAM: VS:  BP 124/98  Pulse 135  Ht 5\' 5"  (1.651 m)  Wt 286 lb 6.4 oz (129.91 kg)  BMI 47.66 kg/m2 Well nourished, well developed, in no acute distress HEENT: normal Neck: no JVD Cardiac:  normal S1, S2; rapid regular rhythm; no murmur Lungs:  clear to auscultation bilaterally, no wheezing, rhonchi or rales Abd: soft, nontender, no hepatomegaly Ext: no edema Skin: warm and dry Neuro:  CNs 2-12 intact, no focal abnormalities noted  EKG:  AFlutter vs ATach, 1:1 vs 2:1, HR 135     ASSESSMENT AND PLAN:  1.Atrial Flutter:  Reviewed with Dr. Sherryl Manges.  She is likely in AFlutter or ATach.  She has been on Eliquis since she felt her heart go back out of rhythm.  She remains on Propafenone.  Will arrange DCCV.  No openings in hospital tomorrow.  So, she will be done Friday.  Start back on Toprol 25 mg QD for rate control.  May need to consider other anti-arrhythmic if she has recurrent arrhythmias.   2. Disposition:  Follow up after DCCV.  Signed, Tereso Newcomer, PA-C  11:11 AM 10/27/2012

## 2012-10-27 NOTE — Patient Instructions (Addendum)
Your physician has recommended that you have a Cardioversion (DCCV) DX A FLUTTER. THIS WILL BE ON 10/29/12 @ 1:30 WITH DR. Shirlee Latch. Electrical Cardioversion uses a jolt of electricity to your heart either through paddles or wired patches attached to your chest. This is a controlled, usually prescheduled, procedure. Defibrillation is done under light anesthesia in the hospital, and you usually go home the day of the procedure. This is done to get your heart back into a normal rhythm. You are not awake for the procedure. Please see the instruction sheet given to you today.  Your physician recommends that you return for lab work in: TODAY BMET, CBC W/DIFF PRE DCCV LABS  START TOPROL XL 25 MG 1 TABLET DAILY MAKE SURE TO HOLD TORPOL THE MORNING OF CARDIOVERSION PER SCOTT WEAVER, PAC

## 2012-10-27 NOTE — Progress Notes (Signed)
[address], [address] [place], [place]  [address] Phone: [phone], Fax:  [phone]  Date:  10/27/2012   Name:  Heather [name]   DOB:  October 29, 1950   MRN:  [id number]  PCP:  Leo Grosser, MD  Primary Cardiologist:  Dr. [name]  Primary Electrophysiologist:  None    History of Present Illness: Heather [name] is a 62 y.o. female who returns for evaluation of AFib.  She has a hx of morbid obesity s/p lap band surgery and atrial fibrillation s/p ablation. Patient developed atrial fibrillation in 2004. She had a cath in 2004 showing no significant CAD. She had atrial fibrillation ablation in 2005 with a redo in 2007. She has had no documented atrial fibrillation since 2007 until an appointment early 2013. Heather [name] was noted to be in coarse atrial fibrillation with HR in the 90s.  She was started on apixaban and Toprol XL 25 mg daily. The atrial fibrillation was persistent and seemed to be causing increased fatigue. Dr. Shirlee Latch planned to start her on dofetilide, but her QT interval was too long. Instead, she was started on propafenone and cardioverted her to NSR in 6/13. In 9/13, she was in atrial flutter.  Her propafenone was increased to 325 mg bid and she was cardioverted to NSR. Lexiscan myoview done given use of Ic agent showed soft tissue attenuation but no evidence for ischemia or infarction in 6/13. She had increased dyspnea with propafenone at 325 mg bid, so it was decreased back to 225 mg bid. This helped her dyspnea. Last seen by Dr. Shirlee Latch in 12/13. Since then, she underwent surgical repair of cystocele and rectocele.  Post op course was uneventful.    Patient started back on Eliquis several days ago.  She felt her heart go out of rhythm 3 days ago.  She feels fatigued and more dyspneic.  She notes rapid palpitations.  No chest pain, orthopnea, PND, edema, syncope.    Labs (3/11):  BNP 65, K 4.2, creatinine 0.83, TSH normal, HCT 39.1  Labs (5/13):  K  3.9, creaitnine 1.0, HCT 37.9  Labs (6/13):  K 3.8, creatinine 0.85  Labs (9/13):  K 4.3, creatinine 0.8  Labs (1/14):  K 4.3, creatinine 0.84, ALT 22, Hgb 12.3   Wt Readings from Last 3 Encounters:  10/27/12 286 lb 6.4 oz (129.91 kg)  10/21/12 298 lb (135.172 kg)  10/21/12 298 lb (135.172 kg)    Past Medical History:  1. L4/ L5 disk disease, s/p surgery  2. hypertension  3. Obesity: s/p Lap band surgery 7/09  4. Atrial fibrillation: Began in 2004. Had ablations at [facility] in 2005 and 2007. Unable to tolerate flecainide. Back in atrial fibrillation in 5/13. QT too long for dofetilide. Propafenone started with DCCV to NSR in 6/13. Atrial flutter in 9/13 with cardioversion back to NSR. Dyspnea with propafenone 325 mg bid, can tolerate 225 mg bid.  5. Left heart cath (2004): No significant CAD, false + cardiolite. EF 60%. Lexiscan myoview (6/13) with EF 60%, soft tissue attenuation noted but no ischemia or infarction.  6. Echo (3/11): LV EF 55-60%, no significant valvular dysfunction, RV-RA gradient 26 mmHg.  7. Hypothyroidism  8. 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 GERD: Resolved after lap band  Past Medical History  Diagnosis Date  . HTN (hypertension)   . Obesity   . Carpal tunnel syndrome   . Anemia     "onc;e; had to take iron  for awhile"  . Degenerative disk disease   . History of blood transfusion     "w/both hip replacements"  . IBS (irritable bowel syndrome)   . Morton's neuroma     right foot  . Atrial fibrillation     Began in 2004.  Had ablations at [facility] in 2005 and 2007.  She is off coumadin  now with no noted recurrent atrial fibrillation since 2007. til 04/01/12; s/p initiation of Rhythmol with DCCV June 2013  . Hypothyroidism     no meds currently  . Sleep apnea 04/01/12    "dx'd just last week"  . Sleep apnea     uses CPAP  . Interstitial cystitis   . GERD (gastroesophageal reflux disease)     resolved after lap band     Current Outpatient Prescriptions  Medication Sig Dispense Refill  . amitriptyline (ELAVIL) 10 MG tablet Take 10 mg by mouth at bedtime.      Marland Kitchen apixaban (ELIQUIS) 5 MG TABS tablet Take 1 tablet (5 mg total) by mouth 2 (two) times daily.  60 tablet  1  . Cholecalciferol (VITAMIN D3) 5000 UNITS TABS Take 1 tablet by mouth See admin instructions. Take 1 tablet by mouth daily on Monday, Tuesday, Wednesday, Thursday, Friday.      . diltiazem (CARDIZEM CD) 120 MG 24 hr capsule TAKE ONE CAPSULE EVERY DAY  30 capsule  6  . docusate sodium 100 MG CAPS Take 100 mg by mouth 3 (three) times daily.  100 capsule  1  . estradiol (ESTRACE VAGINAL) 0.1 MG/GM vaginal cream One applicator per vagina 3 times a week.  42.5 g  6  . fexofenadine (ALLEGRA) 180 MG tablet Take 180 mg by mouth daily.      Marland Kitchen guaiFENesin (MUCINEX) 600 MG 12 hr tablet Take 600 mg by mouth 2 (two) times daily. For allergies.      Marland Kitchen ibuprofen (ADVIL,MOTRIN) 600 MG tablet Take 1 tablet (600 mg total) by mouth every 6 (six) hours as needed for pain.  30 tablet  1  . lisinopril (PRINIVIL,ZESTRIL) 20 MG tablet TAKE 1 TABLET EVERY DAY  30 tablet  5  . meloxicam (MOBIC) 7.5 MG tablet Take 7.5 mg by mouth 2 (two) times daily. For knee pain.      Marland Kitchen ondansetron (ZOFRAN) 4 MG tablet Take 1 tablet (4 mg total) by mouth every 8 (eight) hours as needed.  20 tablet  0  . oxyCODONE-acetaminophen (PERCOCET/ROXICET) 5-325 MG per tablet Take 1-2 tablets by mouth every 4 (four) hours as needed (moderate to severe pain (when tolerating fluids)).  30 tablet  0  . propafenone (RYTHMOL SR) 225 MG 12 hr capsule Take 1 capsule (225 mg total) by mouth every 12 (twelve) hours.  60 capsule  4  . vitamin C (ASCORBIC ACID) 500 MG tablet Take 500 mg by mouth daily.        Allergies: Allergies  Allergen Reactions  . Cephalexin Hives and Other (See Comments)    "throat started closing up"  . Rocephin (Ceftriaxone Sodium In Dextrose) Anaphylaxis  . Penicillins Rash   . Flecainide Acetate Other (See Comments)    "couldn't take it"  . Moxifloxacin     Does not remember the reaction  . Sulfonamide Derivatives     Does not remember reaction ; "was so young when I had reaction to it"    Social History:  The patient  reports that she has never smoked. She has never used smokeless  tobacco. She reports that she does not drink alcohol or use illicit drugs.   ROS:  Please see the history of present illness.      All other systems reviewed and negative.   PHYSICAL EXAM: VS:  BP 124/98  Pulse 135  Ht 5\' 5"  (1.651 m)  Wt 286 lb 6.4 oz (129.91 kg)  BMI 47.66 kg/m2 Well nourished, well developed, in no acute distress HEENT: normal Neck: no JVD Cardiac:  normal S1, S2; rapid regular rhythm; no murmur Lungs:  clear to auscultation bilaterally, no wheezing, rhonchi or rales Abd: soft, nontender, no hepatomegaly Ext: no edema Skin: warm and dry Neuro:  CNs 2-12 intact, no focal abnormalities noted  EKG:  AFlutter vs ATach, 1:1 vs 2:1, HR 135     ASSESSMENT AND PLAN:  1.Atrial Flutter:  Reviewed with Dr. Sherryl Manges.  She is likely in AFlutter or ATach.  She has been on Eliquis since she felt her heart go back out of rhythm.  She remains on Propafenone.  Will arrange DCCV.  No openings in hospital tomorrow.  So, she will be done Friday.  Start back on Toprol 25 mg QD for rate control.  May need to consider other anti-arrhythmic if she has recurrent arrhythmias.   2. Disposition:  Follow up after DCCV.  Signed, Tereso Newcomer, PA-C  11:11 AM 10/27/2012

## 2012-10-27 NOTE — Telephone Encounter (Signed)
Message copied by Tarri Fuller on Wed Oct 27, 2012  6:00 PM ------      Message from: [place], [place] T      Created: Wed Oct 27, 2012  4:56 PM       Hgb normal      K+ high.  May be hemolyzed.      Repeat BMET.  Can she have drawn at time of DCCV?      Tereso Newcomer, PA-C  4:56 PM 10/27/2012

## 2012-10-27 NOTE — Telephone Encounter (Signed)
pt notified about labs for DCCV 1/17, per Lawernce Keas, PAC need to repeat bmet day of DCCV to make sure K+ ok

## 2012-10-29 ENCOUNTER — Encounter (HOSPITAL_COMMUNITY)
Admission: RE | Disposition: A | Discharge status: Home / Self Care | Payer: Self-pay | Source: Ambulatory Visit | Attending: Cardiology

## 2012-10-29 ENCOUNTER — Ambulatory Visit (HOSPITAL_COMMUNITY)
Admission: RE | Admit: 2012-10-29 | Discharge: 2012-10-29 | Disposition: A | Discharge status: Home / Self Care | Payer: BC Managed Care – PPO | Source: Ambulatory Visit | Attending: Cardiology | Admitting: Cardiology

## 2012-10-29 ENCOUNTER — Encounter (HOSPITAL_COMMUNITY): Payer: Self-pay | Admitting: *Deleted

## 2012-10-29 ENCOUNTER — Ambulatory Visit (HOSPITAL_COMMUNITY): Payer: BC Managed Care – PPO | Admitting: *Deleted

## 2012-10-29 DIAGNOSIS — I4892 Unspecified atrial flutter: Secondary | ICD-10-CM

## 2012-10-29 DIAGNOSIS — I10 Essential (primary) hypertension: Secondary | ICD-10-CM | POA: Insufficient documentation

## 2012-10-29 HISTORY — PX: CARDIOVERSION: SHX1299

## 2012-10-29 HISTORY — DX: Shortness of breath: R06.02

## 2012-10-29 SURGERY — CARDIOVERSION
Anesthesia: General

## 2012-10-29 MED ORDER — SODIUM CHLORIDE 0.9 % IV SOLN
INTRAVENOUS | Status: DC
  Administered 2012-10-29: 500 mL via INTRAVENOUS

## 2012-10-29 MED ORDER — PROPOFOL 10 MG/ML IV BOLUS
INTRAVENOUS | Status: DC | PRN
  Administered 2012-10-29: 30 mg via INTRAVENOUS
  Administered 2012-10-29: 50 mg via INTRAVENOUS

## 2012-10-29 MED ORDER — SODIUM CHLORIDE 0.9 % IV SOLN
INTRAVENOUS | Status: DC | PRN
  Administered 2012-10-29: 14:00:00 via INTRAVENOUS

## 2012-10-29 NOTE — Preoperative (Signed)
Beta Blockers   Reason not to administer Beta Blockers: was instructed not to take Toprol XL this AM--only has had 2 doses, last dose 10/28/12

## 2012-10-29 NOTE — Procedures (Signed)
Electrical Cardioversion Procedure Note Heather [name] [id number] [birth year]  Procedure: Electrical Cardioversion Indications:  Atrial flutter  Procedure Details Consent: Risks of procedure as well as the alternatives and risks of each were explained to the (patient/caregiver).  Consent for procedure obtained. Time Out: Verified patient identification, verified procedure, site/side was marked, verified correct patient position, special equipment/implants available, medications/allergies/relevent history reviewed, required imaging and test results available.  Performed  Patient placed on cardiac monitor, pulse oximetry, supplemental oxygen as necessary.  Sedation given: Propofol IV Pacer pads placed anterior and posterior chest.  Cardioverted 1 time(s).  Cardioverted at 150J.  Evaluation Findings: Post procedure EKG shows: NSR Complications: None Patient did tolerate procedure well.   [name] 10/29/2012, 1:56 PM

## 2012-10-29 NOTE — Transfer of Care (Signed)
Immediate Anesthesia Transfer of Care Note  Patient: Heather [name]  Procedure(s) Performed: Procedure(s) (LRB) with comments: CARDIOVERSION (N/A)  Patient Location: PACU  Anesthesia Type:General  Level of Consciousness: awake, oriented and patient cooperative  Airway & Oxygen Therapy: Patient Spontanous Breathing and Patient connected to nasal cannula oxygen  Post-op Assessment: Report given to PACU RN and Post -op Vital signs reviewed and stable  Post vital signs: Reviewed and stable  Complications: No apparent anesthesia complications

## 2012-10-29 NOTE — Anesthesia Postprocedure Evaluation (Signed)
  Anesthesia Post-op Note  Patient: Heather [name]  Procedure(s) Performed: Procedure(s) (LRB) with comments: CARDIOVERSION (N/A)  Patient Location: PACU and Endoscopy Unit  Anesthesia Type:General  Level of Consciousness: awake, oriented and patient cooperative  Airway and Oxygen Therapy: Patient Spontanous Breathing and Patient connected to nasal cannula oxygen  Post-op Pain: none  Post-op Assessment: Post-op Vital signs reviewed, Patient's Cardiovascular Status Stable and Respiratory Function Stable  Post-op Vital Signs: Reviewed and stable  Complications: No apparent anesthesia complications

## 2012-10-29 NOTE — H&P (View-Only) (Signed)
[address] [place], [place]  [address] Phone: [phone], Fax:  [phone]  Date:  10/27/2012   Name:  Heather [name]   DOB:  03/23/1951   MRN:  [id number]  PCP:  [name],[name], MD  Primary Cardiologist:  Dr. Dalton McLean  Primary Electrophysiologist:  None    History of Present Illness: Heather [name] is a 61 y.o. female who returns for evaluation of AFib.  She has a hx of morbid obesity s/p lap band surgery and atrial fibrillation s/p ablation. Patient developed atrial fibrillation in 2004. She had a cath in 2004 showing no significant CAD. She had atrial fibrillation ablation in 2005 with a redo in 2007. She has had no documented atrial fibrillation since 2007 until an appointment early 2013. Heather [name] was noted to be in coarse atrial fibrillation with HR in the 90s.  She was started on apixaban and Toprol XL 25 mg daily. The atrial fibrillation was persistent and seemed to be causing increased fatigue. Dr. McLean planned to start her on dofetilide, but her QT interval was too long. Instead, she was started on propafenone and cardioverted her to NSR in 6/13. In 9/13, she was in atrial flutter.  Her propafenone was increased to 325 mg bid and she was cardioverted to NSR. Lexiscan myoview done given use of Ic agent showed soft tissue attenuation but no evidence for ischemia or infarction in 6/13. She had increased dyspnea with propafenone at 325 mg bid, so it was decreased back to 225 mg bid. This helped her dyspnea. Last seen by Dr. McLean in 12/13. Since then, she underwent surgical repair of cystocele and rectocele.  Post op course was uneventful.    Patient started back on Eliquis several days ago.  She felt her heart go out of rhythm 3 days ago.  She feels fatigued and more dyspneic.  She notes rapid palpitations.  No chest pain, orthopnea, PND, edema, syncope.    Labs (3/11):  BNP 65, K 4.2, creatinine 0.83, TSH normal, HCT 39.1  Labs (5/13):  K  3.9, creaitnine 1.0, HCT 37.9  Labs (6/13):  K 3.8, creatinine 0.85  Labs (9/13):  K 4.3, creatinine 0.8  Labs (1/14):  K 4.3, creatinine 0.84, ALT 22, Hgb 12.3   Wt Readings from Last 3 Encounters:  10/27/12 286 lb 6.4 oz (129.91 kg)  10/21/12 298 lb (135.172 kg)  10/21/12 298 lb (135.172 kg)    Past Medical History:  1. L4/ L5 disk disease, s/p surgery  2. hypertension  3. Obesity: s/p Lap band surgery 7/09  4. Atrial fibrillation: Began in 2004. Had ablations at [facility] in 2005 and 2007. Unable to tolerate flecainide. Back in atrial fibrillation in 5/13. QT too long for dofetilide. Propafenone started with DCCV to NSR in 6/13. Atrial flutter in 9/13 with cardioversion back to NSR. Dyspnea with propafenone 325 mg bid, can tolerate 225 mg bid.  5. Left heart cath (2004): No significant CAD, false + cardiolite. EF 60%. Lexiscan myoview (6/13) with EF 60%, soft tissue attenuation noted but no ischemia or infarction.  6. Echo (3/11): LV EF 55-60%, no significant valvular dysfunction, RV-RA gradient 26 mmHg.  7. Hypothyroidism  8. 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 GERD: Resolved after lap band  Past Medical History  Diagnosis Date  . HTN (hypertension)   . Obesity   . Carpal tunnel syndrome   . Anemia     "onc;e; had to take iron   for awhile"  . Degenerative disk disease   . History of blood transfusion     "w/both hip replacements"  . IBS (irritable bowel syndrome)   . Morton's neuroma     right foot  . Atrial fibrillation     Began in 2004.  Had ablations at [facility] in 2005 and 2007.  She is off coumadin  now with no noted recurrent atrial fibrillation since 2007. til 04/01/12; s/p initiation of Rhythmol with DCCV June 2013  . Hypothyroidism     no meds currently  . Sleep apnea 04/01/12    "dx'd just last week"  . Sleep apnea     uses CPAP  . Interstitial cystitis   . GERD (gastroesophageal reflux disease)     resolved after lap band     Current Outpatient Prescriptions  Medication Sig Dispense Refill  . amitriptyline (ELAVIL) 10 MG tablet Take 10 mg by mouth at bedtime.      . apixaban (ELIQUIS) 5 MG TABS tablet Take 1 tablet (5 mg total) by mouth 2 (two) times daily.  60 tablet  1  . Cholecalciferol (VITAMIN D3) 5000 UNITS TABS Take 1 tablet by mouth See admin instructions. Take 1 tablet by mouth daily on Monday, Tuesday, Wednesday, Thursday, Friday.      . diltiazem (CARDIZEM CD) 120 MG 24 hr capsule TAKE ONE CAPSULE EVERY DAY  30 capsule  6  . docusate sodium 100 MG CAPS Take 100 mg by mouth 3 (three) times daily.  100 capsule  1  . estradiol (ESTRACE VAGINAL) 0.1 MG/GM vaginal cream One applicator per vagina 3 times a week.  42.5 g  6  . fexofenadine (ALLEGRA) 180 MG tablet Take 180 mg by mouth daily.      . guaiFENesin (MUCINEX) 600 MG 12 hr tablet Take 600 mg by mouth 2 (two) times daily. For allergies.      . ibuprofen (ADVIL,MOTRIN) 600 MG tablet Take 1 tablet (600 mg total) by mouth every 6 (six) hours as needed for pain.  30 tablet  1  . lisinopril (PRINIVIL,ZESTRIL) 20 MG tablet TAKE 1 TABLET EVERY DAY  30 tablet  5  . meloxicam (MOBIC) 7.5 MG tablet Take 7.5 mg by mouth 2 (two) times daily. For knee pain.      . ondansetron (ZOFRAN) 4 MG tablet Take 1 tablet (4 mg total) by mouth every 8 (eight) hours as needed.  20 tablet  0  . oxyCODONE-acetaminophen (PERCOCET/ROXICET) 5-325 MG per tablet Take 1-2 tablets by mouth every 4 (four) hours as needed (moderate to severe pain (when tolerating fluids)).  30 tablet  0  . propafenone (RYTHMOL SR) 225 MG 12 hr capsule Take 1 capsule (225 mg total) by mouth every 12 (twelve) hours.  60 capsule  4  . vitamin C (ASCORBIC ACID) 500 MG tablet Take 500 mg by mouth daily.        Allergies: Allergies  Allergen Reactions  . Cephalexin Hives and Other (See Comments)    "throat started closing up"  . Rocephin (Ceftriaxone Sodium In Dextrose) Anaphylaxis  . Penicillins Rash   . Flecainide Acetate Other (See Comments)    "couldn't take it"  . Moxifloxacin     Does not remember the reaction  . Sulfonamide Derivatives     Does not remember reaction ; "was so young when I had reaction to it"    Social History:  The patient  reports that she has never smoked. She has never used smokeless   tobacco. She reports that she does not drink alcohol or use illicit drugs.   ROS:  Please see the history of present illness.      All other systems reviewed and negative.   PHYSICAL EXAM: VS:  BP 124/98  Pulse 135  Ht 5' 5" (1.651 m)  Wt 286 lb 6.4 oz (129.91 kg)  BMI 47.66 kg/m2 Well nourished, well developed, in no acute distress HEENT: normal Neck: no JVD Cardiac:  normal S1, S2; rapid regular rhythm; no murmur Lungs:  clear to auscultation bilaterally, no wheezing, rhonchi or rales Abd: soft, nontender, no hepatomegaly Ext: no edema Skin: warm and dry Neuro:  CNs 2-12 intact, no focal abnormalities noted  EKG:  AFlutter vs ATach, 1:1 vs 2:1, HR 135     ASSESSMENT AND PLAN:  1.Atrial Flutter:  Reviewed with Dr. Steven Klein.  She is likely in AFlutter or ATach.  She has been on Eliquis since she felt her heart go back out of rhythm.  She remains on Propafenone.  Will arrange DCCV.  No openings in hospital tomorrow.  So, she will be done Friday.  Start back on Toprol 25 mg QD for rate control.  May need to consider other anti-arrhythmic if she has recurrent arrhythmias.   2. Disposition:  Follow up after DCCV.  Signed, Ferrell Claiborne, PA-C  11:11 AM 10/27/2012    

## 2012-10-29 NOTE — Interval H&P Note (Signed)
History and Physical Interval Note:  10/29/2012 1:50 PM  Heather [name]  has presented today for surgery, with the diagnosis of a-flutter  The various methods of treatment have been discussed with the patient and family. After consideration of risks, benefits and other options for treatment, the patient has consented to  Procedure(s) (LRB) with comments: CARDIOVERSION (N/A) as a surgical intervention .  The patient's history has been reviewed, patient examined, no change in status, stable for surgery.  I have reviewed the patient's chart and labs.  Questions were answered to the patient's satisfaction.     Braniya Farrugia [employer]

## 2012-10-29 NOTE — Anesthesia Preprocedure Evaluation (Addendum)
Anesthesia Evaluation  Patient identified by MRN, date of birth, ID band Patient awake    Reviewed: Allergy & Precautions, H&P , NPO status , Patient's Chart, lab work & pertinent test results, reviewed documented beta blocker date and time   Airway Mallampati: II TM Distance: >3 FB Neck ROM: Full    Dental  (+) Teeth Intact and Dental Advisory Given   Pulmonary shortness of breath and with exertion, sleep apnea and Continuous Positive Airway Pressure Ventilation ,  breath sounds clear to auscultation        Cardiovascular hypertension, Pt. on medications + dysrhythmias Atrial Fibrillation Rhythm:Irregular Rate:Tachycardia     Neuro/Psych  Neuromuscular disease    GI/Hepatic Neg liver ROS, GERD-  ,  Endo/Other  Hypothyroidism   Renal/GU negative Renal ROS     Musculoskeletal   Abdominal (+) + obese,   Peds  Hematology negative hematology ROS (+)   Anesthesia Other Findings   Reproductive/Obstetrics                          Anesthesia Physical Anesthesia Plan  ASA: III  Anesthesia Plan: General   Post-op Pain Management:    Induction: Intravenous  Airway Management Planned: Mask  Additional Equipment:   Intra-op Plan:   Post-operative Plan:   Informed Consent: I have reviewed the patients History and Physical, chart, labs and discussed the procedure including the risks, benefits and alternatives for the proposed anesthesia with the patient or authorized representative who has indicated his/her understanding and acceptance.   Dental advisory given  Plan Discussed with: Surgeon and Anesthesiologist  Anesthesia Plan Comments:         Anesthesia Quick Evaluation

## 2012-10-29 NOTE — Discharge Instructions (Signed)
Electrical Cardioversion  Cardioversion is the delivery of a jolt of electricity to change the rhythm of the heart. Sticky patches or metal paddles are placed on the chest to deliver the electricity from a special device. This is done to restore a normal rhythm. A rhythm that is too fast or not regular keeps the heart from pumping well.  Compared to medicines used to change an abnormal rhythm, cardioversion is faster and works better. It is also unpleasant and may dislodge blood clots from the heart.  WHEN WOULD THIS BE DONE?   In an emergency:   There is low or no blood pressure as a result of the heart rhythm.   Normal rhythm must be restored as fast as possible to protect the brain and heart from further damage.   It may save a life.   For less serious heart rhythms, such as atrial fibrillation or flutter, in which:   The heart is beating too fast or is not regular.   The heart is still able to pump enough blood, but not as well as it should.   Medicine to change the rhythm has not worked.   It is safe to wait in order to allow time for preparation.  LET YOUR CAREGIVER KNOW ABOUT:    Every medicine you are taking. It is very important to do this! Know when to take or stop taking any of them.   Any time in the past that you have felt your heart was not beating normally.  RISKS AND COMPLICATIONS    Clots may form in the chambers of the heart if it is beating too fast. These clots may be dislodged during the procedure and travel to other parts of the body.   There is risk of a stroke during and after the procedure if a clot moves. Blood thinners lower this risk.   You may have a special test of your heart (TEE) to make sure there are no clots in your heart.  BEFORE THE PROCEDURE    You may have some tests to see how well your heart is working.   You may start taking blood thinners so your blood does not clot as easily.   Other drugs may be given to help your heart work better.  PROCEDURE  (SCHEDULED)   The procedure is typically done in a hospital by a heart doctor (cardiologist).   You will be told when and where to go.   You may be given some medicine through an intravenous (IV) access to reduce discomfort and make you sleepy before the procedure.   Your whole body may move when the shock is delivered. Your chest may feel sore.   You may be able to go home after a few hours. Your heart rhythm will be watched to make sure it does not change.  HOME CARE INSTRUCTIONS    Only take medicine as directed by your caregiver. Be sure you understand how and when to take your medicine.   Learn how to feel your pulse and check it often.   Limit your activity for 48 hours.   Avoid caffeine and other stimulants as directed.  SEEK MEDICAL CARE IF:    You feel like your heart is beating too fast or your pulse is not regular.   You have any questions about your medicines.   You have bleeding that will not stop.  SEEK IMMEDIATE MEDICAL CARE IF:    You are dizzy or feel faint.   It   is hard to breathe or you feel short of breath.   There is a change in discomfort in your chest.   Your speech is slurred or you have trouble moving your arm or leg on one side.   You get a muscle cramp.   Your fingers or toes turn cold or blue.  MAKE SURE YOU:    Understand these instructions.   Will watch your condition.   Will get help right away if you are not doing well or get worse.  Document Released: 09/19/2002 Document Revised: 12/22/2011 Document Reviewed: 01/19/2008  [employer] Patient Information 2013 [employer].

## 2012-11-02 ENCOUNTER — Telehealth (INDEPENDENT_AMBULATORY_CARE_PROVIDER_SITE_OTHER): Payer: Self-pay | Admitting: Surgery

## 2012-11-02 ENCOUNTER — Encounter (HOSPITAL_COMMUNITY): Payer: Self-pay | Admitting: Cardiology

## 2012-11-02 NOTE — Telephone Encounter (Signed)
11/02/12 left message and mailed recall letter for bariatric surgery follow up with Dr. Martin. °Pt had RNY 05/09/08. (lss) ° ° °

## 2012-11-02 NOTE — Addendum Note (Signed)
Addended by: Reine Just on: 11/02/2012 04:15 PM   Modules accepted: Orders

## 2012-11-16 ENCOUNTER — Other Ambulatory Visit: Payer: Self-pay | Admitting: *Deleted

## 2012-11-16 ENCOUNTER — Telehealth: Payer: Self-pay | Admitting: Cardiology

## 2012-11-16 DIAGNOSIS — I4892 Unspecified atrial flutter: Secondary | ICD-10-CM

## 2012-11-16 MED ORDER — METOPROLOL TARTRATE 25 MG PO TABS: 25.0000 mg | ORAL_TABLET | Freq: Two times a day (BID) | ORAL | Status: DC

## 2012-11-16 NOTE — Telephone Encounter (Signed)
Agree 

## 2012-11-16 NOTE — Telephone Encounter (Signed)
Pt called to report that she is back in a-flutter/palpitations intermittently since Sunday 11/14/2012.  She took a 25mg  Metoprolol on Sunday night and was back in rhythm on Monday.  However, she woke up this morning with "rapid flutter".  States it makes her feel dizzy.  She did go ahead and take a 25mg  Metoprolol prior to calling.  Per Norma Fredrickson, NP, Pt should take Metoprolol 25mg  BID and see Dr. Shirlee Latch or Dr. Graciela Husbands soon.  Appt made to see Dr. Graciela Husbands on 11/29/2012. Further advised pt to call us back if she experiences worsening symptoms or if symptoms become unbearable. Pt agreed to plan.

## 2012-11-16 NOTE — Telephone Encounter (Signed)
Pt had a cardioversion recently, went out of rhythm Sunday, got better, now out again today, worse though and also feels dizzy, pls advise

## 2012-11-23 ENCOUNTER — Ambulatory Visit (INDEPENDENT_AMBULATORY_CARE_PROVIDER_SITE_OTHER): Payer: BC Managed Care – PPO | Admitting: Internal Medicine

## 2012-11-23 ENCOUNTER — Encounter: Payer: Self-pay | Admitting: Internal Medicine

## 2012-11-23 VITALS — BP 119/81 | HR 60 | Ht 65.0 in | Wt 294.8 lb

## 2012-11-23 DIAGNOSIS — I4891 Unspecified atrial fibrillation: Secondary | ICD-10-CM

## 2012-11-23 DIAGNOSIS — R5383 Other fatigue: Secondary | ICD-10-CM

## 2012-11-23 MED ORDER — METOPROLOL SUCCINATE ER 50 MG PO TB24: ORAL_TABLET | ORAL | Status: DC

## 2012-11-23 MED ORDER — ATENOLOL 50 MG PO TABS: ORAL_TABLET | ORAL | Status: DC

## 2012-11-23 MED ORDER — NEBIVOLOL HCL 5 MG PO TABS: ORAL_TABLET | ORAL | Status: DC

## 2012-11-23 NOTE — Patient Instructions (Addendum)
Your physician recommends that you schedule a follow-up appointment in: June with Dr. Earlean Shawl have been referred to : Dr Hillis Range for consideration of atrial fibrillation ablation (next available)  Your physician wants you to follow-up in: September with Dr. Graciela Husbands. You will receive a reminder letter in the mail two months in advance. If you don't receive a letter, please call our office to schedule the follow-up appointment.  Your physician has recommended you make the following change in your medication:  1) Stop metoprolol tartrate - You are being given prescriptions for 3 different beta blockers to try. You may take them in any order. DO NOT take more than one at a time. Please give at least a 2 week trial on each drug as you can tolerate. 2) Atenolol 50 mg one tablet by mouth daily as directed 3) Metoprolol succinate 50 mg one tablet by mouth daily as directed 4) Bystolic 5 mg one tablet by mouth daily as directed.

## 2012-11-23 NOTE — Assessment & Plan Note (Signed)
Having recurrent atrial fibrillation. Previous attempts to use dofetilide more marked by QTC prolongation although the ECG today is okay. She did not tolerate higher doses of propafenone. We will plan to have her see Dr. Fawn Kirk for consideration of re\re ablation; it having been about a year since her last procedure. Left atrial dimension 2013 146 . She is tolerating her apixaban.

## 2012-11-23 NOTE — Progress Notes (Signed)
Patient Care Team: Donita Brooks, MD as PCP - General (Family Medicine)   HPI  Heather [name] is a 62 y.o. female Seen in followup for atrial fibrillation for which she is   status post catheter ablation x2 at [facility]; she had recurrences of coarse atrial fibrillation the summer and was treated with propafenone initially at 225 and then because of recurrences 325 twice daily; the latter dose had to be decreased because of symptoms.     She has had problems with palpitations which were addressed by Dr. DM given her metoprolol tartrate.  This has been associated with significant fatigue but fewer palpitations. She underwent cardioversion January 2014  She is taking apixaban as an anticoagulant and tolerating it well. She has been diagnosed with sleep apnea and is using her CPAP with some improvement   Past Medical History  Diagnosis Date  . HTN (hypertension)   . Obesity   . Carpal tunnel syndrome   . Anemia     "onc;e; had to take iron for awhile"  . Degenerative disk disease   . History of blood transfusion     "w/both hip replacements"  . IBS (irritable bowel syndrome)   . Morton's neuroma     right foot  . Atrial fibrillation     Began in 2004.  Had ablations at [facility] in 2005 and 2007.  She is off coumadin  now with no noted recurrent atrial fibrillation since 2007. til 04/01/12; s/p initiation of Rhythmol with DCCV June 2013  . Hypothyroidism     no meds currently  . Sleep apnea 04/01/12    "dx'd just last week"  . Sleep apnea     uses CPAP  . Interstitial cystitis   . GERD (gastroesophageal reflux disease)     resolved after lap band  . Shortness of breath     Past Surgical History  Procedure Laterality Date  . Back surgery    . Carpal tunnel release  1990's    bilaterally  . Lasik    . Cardiac catheterization    . Laparoscopic gastric banding  2009  . Cardiac electrophysiology mapping and ablation  ~ 2005 and 2007    "did more 2nd time; both at  [facility]"  . Tonsillectomy and adenoidectomy  1957  . Vaginal hysterectomy  2000  . Tubal ligation  1980  . Dilation and curettage of uterus  2000  . Total hip arthroplasty  ~2009; 2010    right; left  . Joint replacement      bilateral hips  . Posterior fusion lumbar spine  2000's    "nerve problems"  . Posterior fusion lumbar spine  2000's  . Cardioversion  04/02/2012    Procedure: CARDIOVERSION;  Surgeon: Laurey Morale, MD;  Location: [facility] OR;  Service: Cardiovascular;  Laterality: N/A;  . Cardioversion  06/16/2012    Procedure: CARDIOVERSION;  Surgeon: [name], MD;  Location: [facility] ENDOSCOPY;  Service: Cardiovascular;  Laterality: N/A;  amanda/ebp/Beverly( or scheduling)  . Anterior and posterior repair  10/20/2012    Procedure: ANTERIOR (CYSTOCELE) AND POSTERIOR REPAIR (RECTOCELE);  Surgeon: Esmeralda Arthur, MD;  Location: [facility] ORS;  Service: Gynecology;  Laterality: N/A;  2 hours  . Cardioversion  10/29/2012    Procedure: CARDIOVERSION;  Surgeon: Laurey Morale, MD;  Location: [facility] ENDOSCOPY;  Service: Cardiovascular;  Laterality: N/A;    Current Outpatient Prescriptions  Medication Sig Dispense Refill  . amitriptyline (ELAVIL) 10 MG tablet Take 10  mg by mouth at bedtime.      Marland Kitchen apixaban (ELIQUIS) 5 MG TABS tablet Take 1 tablet (5 mg total) by mouth 2 (two) times daily.  60 tablet  1  . Cholecalciferol (VITAMIN D3) 5000 UNITS TABS Take 1 tablet by mouth See admin instructions. Take 1 tablet by mouth daily on Monday, Tuesday, Wednesday, Thursday, Friday.      . diltiazem (CARDIZEM CD) 120 MG 24 hr capsule TAKE ONE CAPSULE EVERY DAY  30 capsule  6  . docusate sodium 100 MG CAPS Take 100 mg by mouth 3 (three) times daily.  100 capsule  1  . estradiol (ESTRACE VAGINAL) 0.1 MG/GM vaginal cream One applicator per vagina 3 times a week.  42.5 g  6  . fexofenadine (ALLEGRA) 180 MG tablet Take 180 mg by mouth daily.      Marland Kitchen guaiFENesin (MUCINEX) 600 MG 12 hr tablet Take 600 mg by mouth  2 (two) times daily. For allergies.      Marland Kitchen ibuprofen (ADVIL,MOTRIN) 600 MG tablet Take 1 tablet (600 mg total) by mouth every 6 (six) hours as needed for pain.  30 tablet  1  . lisinopril (PRINIVIL,ZESTRIL) 20 MG tablet TAKE 1 TABLET EVERY DAY  30 tablet  5  . meloxicam (MOBIC) 7.5 MG tablet Take 7.5 mg by mouth 2 (two) times daily. For knee pain.      . metoprolol tartrate (LOPRESSOR) 25 MG tablet Take 1 tablet (25 mg total) by mouth 2 (two) times daily.  60 tablet  1  . ondansetron (ZOFRAN) 4 MG tablet Take 1 tablet (4 mg total) by mouth every 8 (eight) hours as needed.  20 tablet  0  . oxyCODONE-acetaminophen (PERCOCET/ROXICET) 5-325 MG per tablet Take 1-2 tablets by mouth every 4 (four) hours as needed (moderate to severe pain (when tolerating fluids)).  30 tablet  0  . propafenone (RYTHMOL SR) 225 MG 12 hr capsule Take 1 capsule (225 mg total) by mouth every 12 (twelve) hours.  60 capsule  4  . vitamin C (ASCORBIC ACID) 500 MG tablet Take 500 mg by mouth daily.       No current facility-administered medications for this visit.    Allergies  Allergen Reactions  . Cephalexin Hives and Other (See Comments)    "throat started closing up"  . Rocephin (Ceftriaxone Sodium In Dextrose) Anaphylaxis  . Penicillins Rash  . Flecainide Acetate Other (See Comments)    "couldn't take it"  . Moxifloxacin     Does not remember the reaction  . Sulfonamide Derivatives     Does not remember reaction ; "was so young when I had reaction to it"    Review of Systems negative except from HPI and PMH  Physical Exam BP 119/81  Pulse 60  Ht 5\' 5"  (1.651 m)  Wt 294 lb 12.8 oz (133.72 kg)  BMI 49.06 kg/m2 Well developed and morbidly obese female in no distress  HENT normal Neck supple with JVP-flat Carotids brisk and full without bruits Clear Regular rate and rhythm, no murmurs or gallops Abd-soft with active BS without hepatomegaly No Clubbing cyanosis edema Skin-warm and dry A & Oriented   Grossly normal sensory and motor function   electrocardiogram demonstrates sinus rhythm at 60  intervals 21/09/40 Axis XIV   Assessment and  Plan

## 2012-12-01 ENCOUNTER — Ambulatory Visit: Payer: BC Managed Care – PPO | Admitting: Internal Medicine

## 2012-12-01 ENCOUNTER — Encounter: Payer: Self-pay | Admitting: Obstetrics and Gynecology

## 2012-12-01 ENCOUNTER — Ambulatory Visit: Payer: BC Managed Care – PPO | Admitting: Obstetrics and Gynecology

## 2012-12-01 VITALS — BP 116/68 | Ht 65.0 in | Wt 292.0 lb

## 2012-12-01 DIAGNOSIS — K469 Unspecified abdominal hernia without obstruction or gangrene: Secondary | ICD-10-CM

## 2012-12-01 MED ORDER — SOLIFENACIN SUCCINATE 5 MG PO TABS: 5.0000 mg | ORAL_TABLET | Freq: Every day | ORAL | Status: DC

## 2012-12-01 NOTE — Progress Notes (Signed)
Surgery: Cure of enterocele Date: 10/20/2012  Eating a regular diet without difficulty. Bowel movements are normal.  The patient is not having any pain.  Bladder function is returned to normal Vaginal bleeding: none Vaginal discharge: pt noticed pink discharge this past weekend    Subjective:     Heather [name] is a 62 y.o. female who presents for post-op visit.  Pathology report:  No pathology  The following portions of the patient's history were reviewed and updated as appropriate: allergies, current medications, past family history, past medical history, past social history, past surgical history and problem list.  Review of Systems Pertinent items are noted in HPI.   Objective:    There were no vitals taken for this visit. Weight:  Wt Readings from Last 1 Encounters:  11/23/12 294 lb 12.8 oz (133.72 kg)    BMI: There is no weight on file to calculate BMI.  General Appearance: Alert, appropriate appearance for age. No acute distress Lungs: clear to auscultation bilaterally Back: No CVA tenderness Cardiovascular: Regular rate and rhythm. S1, S2, no murmur Gastrointestinal: Soft, non-tender, no masses or organomegaly Pelvic Exam: vaginal cuff healing well. Small granuloma: AgNO3 applied   Bimanual exam normal  Assessment:    Doing well postoperatively. Post-op OAB.   Plan:   Overactive Bladder discussed Vesicare 5 mg x 4 weeks. If no improvement double dosage to 10 mg. Sent to pharmacy No intercourse for 2 more weeks Silicone based lubricant recommended  AEX 05/2013  Silverio Lay MD 2/19/20142:38 PM

## 2012-12-15 ENCOUNTER — Encounter: Payer: Self-pay | Admitting: Internal Medicine

## 2012-12-15 ENCOUNTER — Ambulatory Visit (INDEPENDENT_AMBULATORY_CARE_PROVIDER_SITE_OTHER): Payer: BC Managed Care – PPO | Admitting: Internal Medicine

## 2012-12-15 ENCOUNTER — Encounter: Payer: Self-pay | Admitting: *Deleted

## 2012-12-15 VITALS — BP 133/65 | HR 62 | Ht 65.0 in | Wt 296.2 lb

## 2012-12-15 DIAGNOSIS — G4733 Obstructive sleep apnea (adult) (pediatric): Secondary | ICD-10-CM

## 2012-12-15 DIAGNOSIS — I4891 Unspecified atrial fibrillation: Secondary | ICD-10-CM

## 2012-12-15 NOTE — Patient Instructions (Addendum)

## 2012-12-16 ENCOUNTER — Encounter (HOSPITAL_COMMUNITY): Payer: Self-pay | Admitting: Pharmacy Technician

## 2012-12-16 ENCOUNTER — Encounter: Payer: Self-pay | Admitting: Internal Medicine

## 2012-12-16 NOTE — Assessment & Plan Note (Signed)
Weight loss is strongly advised 

## 2012-12-16 NOTE — Progress Notes (Signed)
Primary Care Physician: Leo Grosser, MD Referring Physician:  Dr Boykin Nearing is a 62 y.o. [name] with a h/o atrial fibrillation s/p afib ablation x 2 by Heather [name] previously at [facility] who presents for EP evaluation for possible afib ablation.  Heather [name] reports initially having afib more than 10 years ago.  Heather [name] was evaluated by Heather [name] and underwent PVI x 2.  Heather [name] did very well for several years post ablation.  Unfortunately, Heather [name] has recently developed worsening afib.  Heather [name] reports symptoms of palpitations and decreased exercise tolerance.  Heather [name] has failed medical therapy with propafenone.  Heather [name] has not tolerated beta blockers due to fatigue.   Heather [name] required cardioversion January 2014.  Heather [name] is taking apixaban as an anticoagulant and tolerating it well. Heather [name] has been diagnosed with sleep apnea and is using her CPAP with some improvement   Today, Heather [name] denies symptoms of  chest pain, shortness of breath, orthopnea, PND, lower extremity edema, dizziness, presyncope, syncope, or neurologic sequela. The patient is tolerating medications without difficulties and is otherwise without complaint today.   Past Medical History  Diagnosis Date  . HTN (hypertension)   . Obesity   . Carpal tunnel syndrome   . Anemia     "onc;e; had to take iron for awhile"  . Degenerative disk disease   . History of blood transfusion     "w/both hip replacements"  . IBS (irritable bowel syndrome)   . Morton's neuroma     right foot  . Atrial fibrillation     Began in 2004.  Had ablations at [facility] in 2005 and 2007.  Heather [name] is off coumadin  now with no noted recurrent atrial fibrillation since 2007. til 04/01/12; s/p initiation of Rhythmol with DCCV June 2013  . Hypothyroidism     no meds currently  . Sleep apnea 04/01/12    "dx'd just last week"  . Sleep apnea     uses CPAP  . Interstitial cystitis   . GERD (gastroesophageal reflux disease)     resolved after lap band  . Shortness of breath     Past Surgical History  Procedure Laterality Date  . Back surgery    . Carpal tunnel release  1990's    bilaterally  . Lasik    . Cardiac catheterization    . Laparoscopic gastric banding  2009  . Cardiac electrophysiology mapping and ablation  ~ 2005 and 2007    "did more 2nd time; both at [facility]"  . Tonsillectomy and adenoidectomy  1957  . Vaginal hysterectomy  2000  . Tubal ligation  1980  . Dilation and curettage of uterus  2000  . Total hip arthroplasty  ~2009; 2010    right; left  . Joint replacement      bilateral hips  . Posterior fusion lumbar spine  2000's    "nerve problems"  . Posterior fusion lumbar spine  2000's  . Cardioversion  04/02/2012    Procedure: CARDIOVERSION;  Surgeon: Laurey Morale, MD;  Location: [facility] OR;  Service: Cardiovascular;  Laterality: N/A;  . Cardioversion  06/16/2012    Procedure: CARDIOVERSION;  Surgeon: [name], MD;  Location: [facility] ENDOSCOPY;  Service: Cardiovascular;  Laterality: N/A;  amanda/ebp/Beverly( or scheduling)  . Anterior and posterior repair  10/20/2012    Procedure: ANTERIOR (CYSTOCELE) AND POSTERIOR REPAIR (RECTOCELE);  Surgeon: Esmeralda Arthur, MD;  Location: [facility] ORS;  Service: Gynecology;  Laterality: N/A;  2 hours  . Cardioversion  10/29/2012    Procedure: CARDIOVERSION;  Surgeon: Laurey Morale, MD;  Location: [facility] ENDOSCOPY;  Service: Cardiovascular;  Laterality: N/A;    Current Outpatient Prescriptions  Medication Sig Dispense Refill  . amitriptyline (ELAVIL) 10 MG tablet Take 10 mg by mouth at bedtime.      Marland Kitchen apixaban (ELIQUIS) 5 MG TABS tablet Take 1 tablet (5 mg total) by mouth 2 (two) times daily.  60 tablet  1  . Cholecalciferol (VITAMIN D3) 5000 UNITS TABS Take 1 tablet by mouth See admin instructions. Take 1 tablet by mouth daily on Monday, Tuesday, Wednesday, Thursday, Friday.      . fexofenadine (ALLEGRA) 180 MG tablet Take 180 mg by mouth daily.      Marland Kitchen guaiFENesin (MUCINEX) 600 MG 12 hr tablet Take 600  mg by mouth 2 (two) times daily as needed. For allergies.      . meloxicam (MOBIC) 7.5 MG tablet Take 7.5 mg by mouth 2 (two) times daily as needed for pain. For knee pain.      Marland Kitchen propafenone (RYTHMOL SR) 225 MG 12 hr capsule Take 1 capsule (225 mg total) by mouth every 12 (twelve) hours.  60 capsule  4  . vitamin C (ASCORBIC ACID) 500 MG tablet Take 500 mg by mouth daily.      Marland Kitchen diltiazem (CARDIZEM CD) 120 MG 24 hr capsule Take 120 mg by mouth daily.      Marland Kitchen lisinopril (PRINIVIL,ZESTRIL) 20 MG tablet Take 20 mg by mouth daily.      . nebivolol (BYSTOLIC) 5 MG tablet Take 2.5 mg by mouth daily.       No current facility-administered medications for this visit.    Allergies  Allergen Reactions  . Cephalexin Hives and Other (See Comments)    "throat started closing up"  . Rocephin (Ceftriaxone Sodium In Dextrose) Anaphylaxis  . Penicillins Rash  . Flecainide Acetate Other (See Comments)    "couldn't take it"  . Moxifloxacin     Does not remember the reaction  . Sulfonamide Derivatives     Does not remember reaction ; "was so young when I had reaction to it"    History   Social History  . Marital Status: Married    Spouse Name: N/A    Number of Children: N/A  . Years of Education: N/A   Occupational History  . Not on file.   Social History Main Topics  . Smoking status: Never Smoker   . Smokeless tobacco: Never Used  . Alcohol Use: No  . Drug Use: No  . Sexually Active: Yes     Comment: LAVH   Other Topics Concern  . 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    Alcohol Use - no    Family History  Problem Relation Age of Onset  . Heart attack      granfather  . Hypertension Father   . Diabetes Father   . Dementia Father   . Atrial fibrillation Mother   . Diabetes Sister   . Diabetes Brother   . Heart disease Maternal Grandmother     ROS- All systems are reviewed and  negative except as per the HPI above  Physical Exam: Filed Vitals:   12/15/12 1534  BP: 133/65  Pulse: 62  Height: 5\' 5"  (1.651 m)  Weight: 296 lb 3.2 oz (134.355 kg)  GEN- The patient is obese appearing, alert and oriented x 3 today.   Head- normocephalic, atraumatic Eyes-  Sclera clear, conjunctiva pink Ears- hearing intact Oropharynx- clear Neck- supple,   Lungs- Clear to ausculation bilaterally, normal work of breathing Heart- Regular rate and rhythm, no murmurs, rubs or gallops, PMI not laterally displaced GI- soft, NT, ND, + BS Extremities- no clubbing, cyanosis, or edema MS- no significant deformity or atrophy Skin- no rash or lesion Psych- euthymic mood, full affect Neuro- strength and sensation are intact  EKG, echos, Heather Koren Bound notes are reviewed  Assessment and Plan:

## 2012-12-16 NOTE — Assessment & Plan Note (Signed)
Compliance with CPAP encouraged

## 2012-12-16 NOTE — Assessment & Plan Note (Signed)
The patient has symptomatic paroxysmal atrial fibrillation.  She has failed medical therapy and previously undergone afib ablation x 2. Therapeutic strategies for afib including medicine and ablation were discussed in detail with the patient today. Risk, benefits, and alternatives to EP study and radiofrequency ablation for afib were also discussed in detail today. These risks include but are not limited to stroke, bleeding, vascular damage, tamponade, perforation, damage to the esophagus, lungs, and other structures, pulmonary vein stenosis, worsening renal function, and death. The patient understands these risk and wishes to proceed.  We will therefore proceed with catheter ablation at the next available time. Given prior ablation, I will obtain a cardiac CT to assess her pulmonary vein anatomy prior to repeat ablation.

## 2012-12-18 ENCOUNTER — Other Ambulatory Visit: Payer: Self-pay | Admitting: Cardiology

## 2012-12-20 ENCOUNTER — Other Ambulatory Visit: Payer: Self-pay | Admitting: *Deleted

## 2012-12-20 DIAGNOSIS — I4891 Unspecified atrial fibrillation: Secondary | ICD-10-CM

## 2012-12-22 ENCOUNTER — Telehealth: Payer: Self-pay | Admitting: Internal Medicine

## 2012-12-22 ENCOUNTER — Encounter (INDEPENDENT_AMBULATORY_CARE_PROVIDER_SITE_OTHER): Payer: Self-pay | Admitting: Surgery

## 2012-12-22 ENCOUNTER — Ambulatory Visit (INDEPENDENT_AMBULATORY_CARE_PROVIDER_SITE_OTHER): Payer: BC Managed Care – PPO | Admitting: Surgery

## 2012-12-22 ENCOUNTER — Encounter: Payer: Self-pay | Admitting: Internal Medicine

## 2012-12-22 ENCOUNTER — Ambulatory Visit (INDEPENDENT_AMBULATORY_CARE_PROVIDER_SITE_OTHER): Payer: BC Managed Care – PPO | Admitting: *Deleted

## 2012-12-22 VITALS — BP 122/70 | HR 74 | Temp 97.7°F | Resp 14 | Ht 65.0 in | Wt 295.4 lb

## 2012-12-22 DIAGNOSIS — Z9884 Bariatric surgery status: Secondary | ICD-10-CM

## 2012-12-22 DIAGNOSIS — I4891 Unspecified atrial fibrillation: Secondary | ICD-10-CM

## 2012-12-22 DIAGNOSIS — I10 Essential (primary) hypertension: Secondary | ICD-10-CM

## 2012-12-22 LAB — CBC WITH DIFFERENTIAL/PLATELET
Basophils Relative: 1 % (ref 0.0–3.0)
Eosinophils Relative: 5.5 % — ABNORMAL HIGH (ref 0.0–5.0)
HCT: 36 % (ref 36.0–46.0)
Hemoglobin: 11.9 g/dL — ABNORMAL LOW (ref 12.0–15.0)
Lymphs Abs: 1.2 10*3/uL (ref 0.7–4.0)
MCV: 85.1 fl (ref 78.0–100.0)
Monocytes Absolute: 0.4 10*3/uL (ref 0.1–1.0)
RBC: 4.23 Mil/uL (ref 3.87–5.11)
WBC: 5.3 10*3/uL (ref 4.5–10.5)

## 2012-12-22 LAB — BASIC METABOLIC PANEL
BUN: 27 mg/dL — ABNORMAL HIGH (ref 6–23)
Chloride: 103 mEq/L (ref 96–112)
Potassium: 4.3 mEq/L (ref 3.5–5.1)
Sodium: 139 mEq/L (ref 135–145)

## 2012-12-22 NOTE — Progress Notes (Signed)
[facility]  Ovidio Kin, MD,  FACS [address]  [address] [place], [place]    [address] Phone:  [phone] FAX:  [phone]   Re:   Heather [name] DOB:   1950/12/02 MRN:   [id number]  ASSESSMENT AND PLAN: 1.  Lap band, APS - 05/10/2008 - Dr. Algis Downs. [place]  Last seen 02/28/2011.  Question that the lap band is too tight.  I removed 1 cc today.  She will see [place] back on her anniversary - July 2014 - for follow up  2.  Hypertension 3.  GERD 4.  Hip replacement - Dr. Maureen Ralphs - 2010  She has had both done 5.  For cardiac ablation next week.  Dr. Golden Circle is her usual cardiologist.  Dr. Johney Frame is going to do the ablation.  She's in and out of A Flutter and the meds she is on limit her activity. 5.  Thyroid replacement  HISTORY OF PRESENT ILLNESS: Chief Complaint  Patient presents with  . Bariatric Follow Up    Heather [name] is a 62 y.o. (DOB: 1951/02/24)  white  female who is a patient of [name],[name], MD and comes to me today for follow up of a lap band.  She admits that she has had trouble with food since her last LAP-BAND fill almost 2 years ago. She admits to eating the wrong foods. She says it meats get stuck easily.  She is also limited in her physical activity because of her cardiac issues. She is for cardiac ablation next week. She's had this twice before at [facility]  On CXR 06/15/2012 - the lap band looks in good location. She has gained almost 20 pounds since we last saw her.  She has done with her hip replacement - it is her heart that has limited her activity.  Past Medical History  Diagnosis Date  . HTN (hypertension)   . Obesity   . Carpal tunnel syndrome   . Anemia     "onc;e; had to take iron for awhile"  . Degenerative disk disease   . History of blood transfusion     "w/both hip replacements"  . IBS (irritable bowel syndrome)   . Morton's neuroma     right foot  . Atrial fibrillation     Began in  2004.  Had ablations at [facility] in 2005 and 2007.  She is off coumadin  now with no noted recurrent atrial fibrillation since 2007. til 04/01/12; s/p initiation of Rhythmol with DCCV June 2013  . Hypothyroidism     no meds currently  . Sleep apnea 04/01/12    "dx'd just last week"  . Sleep apnea     uses CPAP  . Interstitial cystitis   . GERD (gastroesophageal reflux disease)     resolved after lap band  . Shortness of breath    Social History:   PHYSICAL EXAM: BP 122/70  Pulse 74  Temp(Src) 97.7 F (36.5 C) (Temporal)  Resp 14  Ht 5\' 5"  (1.651 m)  Wt 295 lb 6.4 oz (133.993 kg)  BMI 49.16 kg/m2  General: WN obese WF who is alert and generally healthy appearing.  Lymph Nodes:  No supraclavicular or cervical nodes. Lungs: Clear to auscultation and symmetric breath sounds. Heart:  RRR. No murmur or rub. Abdomen: Soft. No mass. No tenderness. No hernia. Normal bowel sounds.  Lap band port in RUQ - a little hard to access.  Procedure:  While in  the office, I access her lap band port and removed 1.0 cc - for an estimated remaining fluid of 4.9 cc.  The fluid was under about 0.5 cc of pressure.  DATA REVIEWED: Epic notes  Ovidio Kin, MD, FACS Office:  [phone]

## 2012-12-23 NOTE — Telephone Encounter (Signed)
Spoke with patient and explained the CT to her, she was unaware she had to have the procedure prior to her ablation

## 2012-12-28 ENCOUNTER — Ambulatory Visit (HOSPITAL_COMMUNITY)
Admission: RE | Admit: 2012-12-28 | Discharge: 2012-12-28 | Disposition: A | Discharge status: Home / Self Care | Payer: BC Managed Care – PPO | Source: Ambulatory Visit | Attending: Internal Medicine | Admitting: Internal Medicine

## 2012-12-28 DIAGNOSIS — Z538 Procedure and treatment not carried out for other reasons: Secondary | ICD-10-CM | POA: Insufficient documentation

## 2012-12-28 DIAGNOSIS — I4891 Unspecified atrial fibrillation: Secondary | ICD-10-CM | POA: Insufficient documentation

## 2012-12-28 MED ORDER — METOPROLOL TARTRATE 1 MG/ML IV SOLN
INTRAVENOUS | Status: AC
  Administered 2012-12-28: 5 mg via INTRAVENOUS
  Filled 2012-12-28: qty 10

## 2012-12-28 MED ORDER — METOPROLOL TARTRATE 1 MG/ML IV SOLN
5.0000 mg | INTRAVENOUS | Status: AC | PRN
  Administered 2012-12-28 (×2): 5 mg via INTRAVENOUS

## 2012-12-28 MED ORDER — METOPROLOL TARTRATE 1 MG/ML IV SOLN
INTRAVENOUS | Status: AC
  Administered 2012-12-28: 5 mg via INTRAVENOUS
  Filled 2012-12-28: qty 15

## 2012-12-28 MED ORDER — METOPROLOL TARTRATE 1 MG/ML IV SOLN
5.0000 mg | INTRAVENOUS | Status: DC | PRN
  Administered 2012-12-28: 5 mg via INTRAVENOUS

## 2012-12-28 MED ORDER — METOPROLOL TARTRATE 1 MG/ML IV SOLN: 5.0000 mg | Freq: Once | INTRAVENOUS | Status: AC

## 2012-12-29 ENCOUNTER — Encounter (HOSPITAL_COMMUNITY): Payer: Self-pay

## 2012-12-29 ENCOUNTER — Ambulatory Visit (HOSPITAL_COMMUNITY)
Admission: RE | Admit: 2012-12-29 | Discharge: 2012-12-29 | Disposition: A | Discharge status: Home / Self Care | Payer: BC Managed Care – PPO | Source: Ambulatory Visit | Attending: Cardiology | Admitting: Cardiology

## 2012-12-29 ENCOUNTER — Encounter (HOSPITAL_COMMUNITY)
Admission: RE | Disposition: A | Discharge status: Home / Self Care | Payer: Self-pay | Source: Ambulatory Visit | Attending: Cardiology

## 2012-12-29 DIAGNOSIS — I319 Disease of pericardium, unspecified: Secondary | ICD-10-CM

## 2012-12-29 DIAGNOSIS — I4892 Unspecified atrial flutter: Secondary | ICD-10-CM | POA: Insufficient documentation

## 2012-12-29 DIAGNOSIS — I10 Essential (primary) hypertension: Secondary | ICD-10-CM | POA: Insufficient documentation

## 2012-12-29 DIAGNOSIS — I4891 Unspecified atrial fibrillation: Secondary | ICD-10-CM | POA: Insufficient documentation

## 2012-12-29 HISTORY — PX: TEE WITHOUT CARDIOVERSION: SHX5443

## 2012-12-29 SURGERY — ECHOCARDIOGRAM, TRANSESOPHAGEAL
Anesthesia: Moderate Sedation

## 2012-12-29 MED ORDER — MIDAZOLAM HCL 5 MG/ML IJ SOLN
INTRAMUSCULAR | Status: AC
  Filled 2012-12-29: qty 2

## 2012-12-29 MED ORDER — BUTAMBEN-TETRACAINE-BENZOCAINE 2-2-14 % EX AERO
INHALATION_SPRAY | CUTANEOUS | Status: DC | PRN
  Administered 2012-12-29 (×2): 1 via TOPICAL

## 2012-12-29 MED ORDER — MIDAZOLAM HCL 10 MG/2ML IJ SOLN
INTRAMUSCULAR | Status: DC | PRN
  Administered 2012-12-29 (×2): 2 mg via INTRAVENOUS

## 2012-12-29 MED ORDER — SODIUM CHLORIDE 0.9 % IV SOLN: INTRAVENOUS | Status: DC

## 2012-12-29 MED ORDER — FENTANYL CITRATE 0.05 MG/ML IJ SOLN
INTRAMUSCULAR | Status: DC | PRN
  Administered 2012-12-29 (×2): 25 ug via INTRAVENOUS

## 2012-12-29 MED ORDER — SODIUM CHLORIDE 0.9 % IV SOLN
INTRAVENOUS | Status: DC
  Administered 2012-12-29: 09:00:00 via INTRAVENOUS

## 2012-12-29 MED ORDER — FENTANYL CITRATE 0.05 MG/ML IJ SOLN
INTRAMUSCULAR | Status: AC
  Filled 2012-12-29: qty 2

## 2012-12-29 NOTE — H&P (View-Only) (Signed)
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  She reports initially having afib more than 10 years ago.  She was evaluated by Heather Heather [name] and underwent PVI x 2.  She did very well for several years post ablation.  Unfortunately, she has recently developed worsening afib.  She reports symptoms of palpitations and decreased exercise tolerance.  She has failed medical therapy with propafenone.  She has not tolerated beta blockers due to fatigue.   She required cardioversion January 2014.  She is taking apixaban as an anticoagulant and tolerating it well. She has been diagnosed with sleep apnea and is using her CPAP with some improvement   Today, she denies symptoms of  chest pain, shortness of breath, orthopnea, PND, lower extremity edema, dizziness, presyncope, syncope, or neurologic sequela. The patient is tolerating medications without difficulties and is otherwise without complaint today.   Past Medical History  Diagnosis Date  . HTN (hypertension)   . Obesity   . Carpal tunnel syndrome   . Anemia     "onc;e; had to take iron for awhile"  . Degenerative disk disease   . History of blood transfusion     "w/both hip replacements"  . IBS (irritable bowel syndrome)   . Morton's neuroma     right foot  . Atrial fibrillation     Began in 2004.  Had ablations at [facility] in 2005 and 2007.  She is off coumadin  now with no noted recurrent atrial fibrillation since 2007. til 04/01/12; s/p initiation of Rhythmol with DCCV June 2013  . Hypothyroidism     no meds currently  . Sleep apnea 04/01/12    "dx'd just last week"  . Sleep apnea     uses CPAP  . Interstitial cystitis   . GERD (gastroesophageal reflux disease)     resolved after lap band  . Shortness of breath     Past Surgical History  Procedure Laterality Date  . Back surgery    . Carpal tunnel release  1990's    bilaterally  . Lasik    . Cardiac catheterization    . Laparoscopic gastric banding  2009  . Cardiac electrophysiology mapping and ablation  ~ 2005 and 2007    "did more 2nd time; both at [facility]"  . Tonsillectomy and adenoidectomy  1957  . Vaginal hysterectomy  2000  . Tubal ligation  1980  . Dilation and curettage of uterus  2000  . Total hip arthroplasty  ~2009; 2010    right; left  . Joint replacement      bilateral hips  . Posterior fusion lumbar spine  2000's    "nerve problems"  . Posterior fusion lumbar spine  2000's  . Cardioversion  04/02/2012    Procedure: CARDIOVERSION;  Surgeon: Heather Morale, MD;  Location: [facility] OR;  Service: Cardiovascular;  Laterality: N/A;  . Cardioversion  06/16/2012    Procedure: CARDIOVERSION;  Surgeon: Heather Mixer, MD;  Location: [facility] ENDOSCOPY;  Service: Cardiovascular;  Laterality: N/A;  amanda/ebp/Beverly( or scheduling)  . Anterior and posterior repair  10/20/2012    Procedure: ANTERIOR (CYSTOCELE) AND POSTERIOR REPAIR (RECTOCELE);  Surgeon: Heather Arthur, MD;  Location: [facility] ORS;  Service: Gynecology;  Laterality: N/A;  2 hours  . Cardioversion  10/29/2012    Procedure: CARDIOVERSION;  Surgeon: Heather Morale, MD;  Location: [facility] ENDOSCOPY;  Service: Cardiovascular;  Laterality: N/A;    Current Outpatient Prescriptions  Medication Sig Dispense Refill  . amitriptyline (ELAVIL) 10 MG tablet Take 10 mg by mouth at bedtime.      Marland Kitchen apixaban (ELIQUIS) 5 MG TABS tablet Take 1 tablet (5 mg total) by mouth 2 (two) times daily.  60 tablet  1  . Cholecalciferol (VITAMIN D3) 5000 UNITS TABS Take 1 tablet by mouth See admin instructions. Take 1 tablet by mouth daily on Monday, Tuesday, Wednesday, Thursday, Friday.      . fexofenadine (ALLEGRA) 180 MG tablet Take 180 mg by mouth daily.      Marland Kitchen guaiFENesin (MUCINEX) 600 MG 12 hr tablet Take 600  mg by mouth 2 (two) times daily as needed. For allergies.      . meloxicam (MOBIC) 7.5 MG tablet Take 7.5 mg by mouth 2 (two) times daily as needed for pain. For knee pain.      Marland Kitchen propafenone (RYTHMOL SR) 225 MG 12 hr capsule Take 1 capsule (225 mg total) by mouth every 12 (twelve) hours.  60 capsule  4  . vitamin C (ASCORBIC ACID) 500 MG tablet Take 500 mg by mouth daily.      Marland Kitchen diltiazem (CARDIZEM CD) 120 MG 24 hr capsule Take 120 mg by mouth daily.      Marland Kitchen lisinopril (PRINIVIL,ZESTRIL) 20 MG tablet Take 20 mg by mouth daily.      . nebivolol (BYSTOLIC) 5 MG tablet Take 2.5 mg by mouth daily.       No current facility-administered medications for this visit.    Allergies  Allergen Reactions  . Cephalexin Hives and Other (See Comments)    "throat started closing up"  . Rocephin (Ceftriaxone Sodium In Dextrose) Anaphylaxis  . Penicillins Rash  . Flecainide Acetate Other (See Comments)    "couldn't take it"  . Moxifloxacin     Does not remember the reaction  . Sulfonamide Derivatives     Does not remember reaction ; "was so young when I had reaction to it"    History   Social History  . Marital Status: Married    Spouse Name: N/A    Number of Children: N/A  . Years of Education: N/A   Occupational History  . Not on file.   Social History Main Topics  . Smoking status: Never Smoker   . Smokeless tobacco: Never Used  . Alcohol Use: No  . Drug Use: No  . Sexually Active: Yes     Comment: LAVH   Other Topics Concern  . 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    Alcohol Use - no    Family History  Problem Relation Age of Onset  . Heart attack      granfather  . Hypertension Father   . Diabetes Father   . Dementia Father   . Atrial fibrillation Mother   . Diabetes Sister   . Diabetes Brother   . Heart disease Maternal Grandmother     ROS- All systems are reviewed and  negative except as per the HPI above  Physical Exam: Filed Vitals:   12/15/12 1534  BP: 133/65  Pulse: 62  Height: 5\' 5"  (1.651 m)  Weight: 296 lb 3.2 oz (134.355 kg)  GEN- The patient is obese appearing, alert and oriented x 3 today.   Head- normocephalic, atraumatic Eyes-  Sclera clear, conjunctiva pink Ears- hearing intact Oropharynx- clear Neck- supple,   Lungs- Clear to ausculation bilaterally, normal work of breathing Heart- Regular rate and rhythm, no murmurs, rubs or gallops, PMI not laterally displaced GI- soft, NT, ND, + BS Extremities- no clubbing, cyanosis, or edema MS- no significant deformity or atrophy Skin- no rash or lesion Psych- euthymic mood, full affect Neuro- strength and sensation are intact  EKG, echos, Heather Koren Bound notes are reviewed  Assessment and Plan:

## 2012-12-29 NOTE — Progress Notes (Signed)
  Echocardiogram Echocardiogram Transesophageal has been performed.  Heather [name] 12/29/2012, 11:22 AM

## 2012-12-29 NOTE — Interval H&P Note (Signed)
History and Physical Interval Note:  12/29/2012 10:14 AM  Heather [name]  has presented today for surgery, with the diagnosis of afib  The various methods of treatment have been discussed with the patient and family. After consideration of risks, benefits and other options for treatment, the patient has consented to  Procedure(s): TRANSESOPHAGEAL ECHOCARDIOGRAM (TEE) (N/A) as a surgical intervention .  The patient's history has been reviewed, patient examined, no change in status, stable for surgery.  I have reviewed the patient's chart and labs.  Questions were answered to the patient's satisfaction.     Theron Arista [facility] 12/29/2012 10:14 AM

## 2012-12-29 NOTE — CV Procedure (Signed)
TEE performed with conscious sedation with 4 mg IV Versed and 50 micrograms of IV Fentanyl. No complications.  Preliminary findings:  LV function is mildly impaired with global hypokinesis. HR 135 bpm during exam. Normal LA/LAA without evidence of thrombus. Trivial MR and TR. Right atrium is moderately enlarged.  Full report to follow.  Van Ehlert [place] MD, [facility]  12/29/2012 10:40 AM

## 2012-12-30 ENCOUNTER — Ambulatory Visit (HOSPITAL_COMMUNITY): Payer: BC Managed Care – PPO | Admitting: Certified Registered Nurse Anesthetist

## 2012-12-30 ENCOUNTER — Encounter (HOSPITAL_COMMUNITY)
Admission: RE | Disposition: A | Discharge status: Home / Self Care | Payer: Self-pay | Source: Ambulatory Visit | Attending: Internal Medicine

## 2012-12-30 ENCOUNTER — Encounter (HOSPITAL_COMMUNITY): Payer: Self-pay | Admitting: Certified Registered Nurse Anesthetist

## 2012-12-30 ENCOUNTER — Encounter (HOSPITAL_COMMUNITY): Payer: Self-pay | Admitting: Cardiology

## 2012-12-30 ENCOUNTER — Ambulatory Visit (HOSPITAL_BASED_OUTPATIENT_CLINIC_OR_DEPARTMENT_OTHER)
Admission: RE | Admit: 2012-12-30 | Discharge: 2012-12-31 | Disposition: A | Discharge status: Home / Self Care | Payer: BC Managed Care – PPO | Source: Ambulatory Visit | Attending: Internal Medicine | Admitting: Internal Medicine

## 2012-12-30 DIAGNOSIS — G4733 Obstructive sleep apnea (adult) (pediatric): Secondary | ICD-10-CM | POA: Diagnosis present

## 2012-12-30 DIAGNOSIS — I480 Paroxysmal atrial fibrillation: Secondary | ICD-10-CM | POA: Diagnosis present

## 2012-12-30 DIAGNOSIS — I4891 Unspecified atrial fibrillation: Secondary | ICD-10-CM | POA: Diagnosis present

## 2012-12-30 DIAGNOSIS — I4892 Unspecified atrial flutter: Secondary | ICD-10-CM

## 2012-12-30 DIAGNOSIS — E669 Obesity, unspecified: Secondary | ICD-10-CM | POA: Diagnosis present

## 2012-12-30 HISTORY — PX: ATRIAL FIBRILLATION ABLATION: SHX5456

## 2012-12-30 LAB — POCT ACTIVATED CLOTTING TIME
Activated Clotting Time: 317 seconds
Activated Clotting Time: 187 seconds

## 2012-12-30 SURGERY — ATRIAL FIBRILLATION ABLATION
Anesthesia: General

## 2012-12-30 MED ORDER — HEPARIN SODIUM (PORCINE) 1000 UNIT/ML IJ SOLN
INTRAMUSCULAR | Status: AC
  Filled 2012-12-30: qty 1

## 2012-12-30 MED ORDER — APIXABAN 5 MG PO TABS
5.0000 mg | ORAL_TABLET | Freq: Two times a day (BID) | ORAL | Status: DC
  Administered 2012-12-30 – 2012-12-31 (×2): 5 mg via ORAL
  Filled 2012-12-30 (×4): qty 1

## 2012-12-30 MED ORDER — LIDOCAINE HCL (CARDIAC) 10 MG/ML IV SOLN
INTRAVENOUS | Status: DC | PRN
  Administered 2012-12-30: 60 mg via INTRAVENOUS

## 2012-12-30 MED ORDER — BUPIVACAINE HCL (PF) 0.25 % IJ SOLN
INTRAMUSCULAR | Status: AC
  Filled 2012-12-30: qty 30

## 2012-12-30 MED ORDER — PROPOFOL 10 MG/ML IV BOLUS
INTRAVENOUS | Status: DC | PRN
  Administered 2012-12-30: 150 mg via INTRAVENOUS

## 2012-12-30 MED ORDER — METOPROLOL TARTRATE 25 MG PO TABS
25.0000 mg | ORAL_TABLET | Freq: Every day | ORAL | Status: DC
  Administered 2012-12-30: 25 mg via ORAL
  Filled 2012-12-30: qty 1

## 2012-12-30 MED ORDER — ONDANSETRON HCL 4 MG/2ML IJ SOLN
INTRAMUSCULAR | Status: DC | PRN
  Administered 2012-12-30: 4 mg via INTRAVENOUS

## 2012-12-30 MED ORDER — LISINOPRIL 20 MG PO TABS
20.0000 mg | ORAL_TABLET | Freq: Every day | ORAL | Status: DC
  Administered 2012-12-30 – 2012-12-31 (×2): 20 mg via ORAL
  Filled 2012-12-30 (×2): qty 1

## 2012-12-30 MED ORDER — HYDROCODONE-ACETAMINOPHEN 5-325 MG PO TABS: 1.0000 | ORAL_TABLET | ORAL | Status: DC | PRN

## 2012-12-30 MED ORDER — FENTANYL CITRATE 0.05 MG/ML IJ SOLN
INTRAMUSCULAR | Status: DC | PRN
  Administered 2012-12-30: 25 ug via INTRAVENOUS
  Administered 2012-12-30: 50 ug via INTRAVENOUS
  Administered 2012-12-30: 25 ug via INTRAVENOUS
  Administered 2012-12-30 (×2): 50 ug via INTRAVENOUS

## 2012-12-30 MED ORDER — HEPARIN SODIUM (PORCINE) 1000 UNIT/ML IJ SOLN
INTRAMUSCULAR | Status: DC | PRN
  Administered 2012-12-30 (×2): 3000 [IU] via INTRAVENOUS
  Administered 2012-12-30: 1000 [IU] via INTRAVENOUS
  Administered 2012-12-30: 10000 [IU] via INTRAVENOUS

## 2012-12-30 MED ORDER — SODIUM CHLORIDE 0.9 % IJ SOLN: 3.0000 mL | INTRAMUSCULAR | Status: DC | PRN

## 2012-12-30 MED ORDER — MIDAZOLAM HCL 5 MG/5ML IJ SOLN
INTRAMUSCULAR | Status: DC | PRN
  Administered 2012-12-30: 2 mg via INTRAVENOUS

## 2012-12-30 MED ORDER — APIXABAN 5 MG PO TABS
5.0000 mg | ORAL_TABLET | ORAL | Status: AC
  Administered 2012-12-30: 5 mg via ORAL
  Filled 2012-12-30: qty 1

## 2012-12-30 MED ORDER — LACTATED RINGERS IV SOLN
INTRAVENOUS | Status: DC | PRN
  Administered 2012-12-30: 07:00:00 via INTRAVENOUS

## 2012-12-30 MED ORDER — PROPOFOL INFUSION 10 MG/ML OPTIME
INTRAVENOUS | Status: DC | PRN
  Administered 2012-12-30: 120 ug/kg/min via INTRAVENOUS

## 2012-12-30 MED ORDER — SODIUM CHLORIDE 0.9 % IJ SOLN
3.0000 mL | Freq: Two times a day (BID) | INTRAMUSCULAR | Status: DC
  Administered 2012-12-30 (×2): 3 mL via INTRAVENOUS

## 2012-12-30 MED ORDER — PROPAFENONE HCL ER 225 MG PO CP12
225.0000 mg | ORAL_CAPSULE | Freq: Two times a day (BID) | ORAL | Status: DC
  Administered 2012-12-30 – 2012-12-31 (×3): 225 mg via ORAL
  Filled 2012-12-30 (×4): qty 1

## 2012-12-30 MED ORDER — ONDANSETRON HCL 4 MG/2ML IJ SOLN: 4.0000 mg | Freq: Four times a day (QID) | INTRAMUSCULAR | Status: DC | PRN

## 2012-12-30 MED ORDER — SODIUM CHLORIDE 0.9 % IV SOLN: 250.0000 mL | INTRAVENOUS | Status: DC | PRN

## 2012-12-30 MED ORDER — APIXABAN 5 MG PO TABS: 5.0000 mg | ORAL_TABLET | Freq: Once | ORAL | Status: DC

## 2012-12-30 MED ORDER — HYDROMORPHONE HCL PF 2 MG/ML IJ SOLN: 0.2500 mg | INTRAMUSCULAR | Status: DC | PRN

## 2012-12-30 MED ORDER — PROTAMINE SULFATE 10 MG/ML IV SOLN
INTRAVENOUS | Status: DC | PRN
  Administered 2012-12-30 (×2): 10 mg via INTRAVENOUS
  Administered 2012-12-30: 5 mg via INTRAVENOUS
  Administered 2012-12-30: 10 mg via INTRAVENOUS
  Administered 2012-12-30: 5 mg via INTRAVENOUS

## 2012-12-30 MED ORDER — ACETAMINOPHEN 325 MG PO TABS: 650.0000 mg | ORAL_TABLET | ORAL | Status: DC | PRN

## 2012-12-30 MED ORDER — DILTIAZEM HCL ER COATED BEADS 120 MG PO CP24
120.0000 mg | ORAL_CAPSULE | Freq: Every day | ORAL | Status: DC
  Administered 2012-12-30 – 2012-12-31 (×2): 120 mg via ORAL
  Filled 2012-12-30 (×2): qty 1

## 2012-12-30 MED ORDER — AMITRIPTYLINE HCL 10 MG PO TABS
10.0000 mg | ORAL_TABLET | Freq: Every day | ORAL | Status: DC
  Administered 2012-12-30: 10 mg via ORAL
  Filled 2012-12-30 (×2): qty 1

## 2012-12-30 MED ORDER — PHENYLEPHRINE HCL 10 MG/ML IJ SOLN
INTRAMUSCULAR | Status: DC | PRN
  Administered 2012-12-30 (×2): 40 ug via INTRAVENOUS

## 2012-12-30 NOTE — Brief Op Note (Signed)
Atypical atrial flutter ablated along the left atrial side of the interatrial septum See dictation # [phone]

## 2012-12-30 NOTE — Anesthesia Procedure Notes (Signed)
Procedure Name: LMA Insertion Date/Time: 12/30/2012 8:17 AM Performed by: Orvilla Fus A Pre-anesthesia Checklist: Patient identified, Timeout performed, Emergency Drugs available, Suction available and Patient being monitored Patient Re-evaluated:Patient Re-evaluated prior to inductionOxygen Delivery Method: Circle system utilized Preoxygenation: Pre-oxygenation with 100% oxygen Intubation Type: IV induction Ventilation: Mask ventilation without difficulty LMA: LMA with gastric port inserted LMA Size: 4.0 Number of attempts: 1 Placement Confirmation: positive ETCO2 and breath sounds checked- equal and bilateral Tube secured with: Tape Dental Injury: Teeth and Oropharynx as per pre-operative assessment

## 2012-12-30 NOTE — Anesthesia Preprocedure Evaluation (Addendum)
Anesthesia Evaluation  Patient identified by MRN, date of birth, ID band Patient awake    Reviewed: Allergy & Precautions, H&P , NPO status , Patient's Chart, lab work & pertinent test results, reviewed documented beta blocker date and time   Airway Mallampati: II TM Distance: >3 FB Neck ROM: Full    Dental no notable dental hx. (+) Teeth Intact and Dental Advisory Given   Pulmonary shortness of breath, sleep apnea and Continuous Positive Airway Pressure Ventilation ,  breath sounds clear to auscultation  Pulmonary exam normal       Cardiovascular hypertension, On Medications and On Home Beta Blockers + dysrhythmias Atrial Fibrillation Rhythm:Irregular Rate:Normal     Neuro/Psych PSYCHIATRIC DISORDERS negative neurological ROS  negative psych ROS   GI/Hepatic Neg liver ROS, GERD-  ,  Endo/Other  Hypothyroidism   Renal/GU negative Renal ROS  negative genitourinary   Musculoskeletal   Abdominal   Peds  Hematology negative hematology ROS (+)   Anesthesia Other Findings   Reproductive/Obstetrics negative OB ROS                          Anesthesia Physical Anesthesia Plan  ASA: III  Anesthesia Plan: General   Post-op Pain Management:    Induction: Intravenous  Airway Management Planned: LMA  Additional Equipment:   Intra-op Plan:   Post-operative Plan: Extubation in OR  Informed Consent: I have reviewed the patients History and Physical, chart, labs and discussed the procedure including the risks, benefits and alternatives for the proposed anesthesia with the patient or authorized representative who has indicated his/her understanding and acceptance.   Dental advisory given  Plan Discussed with: CRNA and Surgeon  Anesthesia Plan Comments:        Anesthesia Quick Evaluation

## 2012-12-30 NOTE — Interval H&P Note (Signed)
History and Physical Interval Note:  12/30/2012 6:54 AM  Heather [name]  has presented today for surgery, with the diagnosis of AFib  The various methods of treatment have been discussed with the patient and family. After consideration of risks, benefits and other options for treatment, the patient has consented to  Procedure(s): ATRIAL FIBRILLATION ABLATION (N/A) as a surgical intervention .  The patient's history has been reviewed, patient examined, no change in status, stable for surgery.  I have reviewed the patient's chart and labs.  Questions were answered to the patient's satisfaction.    Pt reports compliance with eliquis TEE reviewed Unable to have CT due to RVR  Hillis Range

## 2012-12-30 NOTE — Anesthesia Postprocedure Evaluation (Signed)
  Anesthesia Post-op Note  Patient: Heather [name]  Procedure(s) Performed: Procedure(s): ATRIAL FIBRILLATION ABLATION (N/A)  Patient Location: Cath Lab  Anesthesia Type:General  Level of Consciousness: awake  Airway and Oxygen Therapy: Patient Spontanous Breathing  Post-op Pain: none  Post-op Assessment: Post-op Vital signs reviewed  Post-op Vital Signs: stable  Complications: No apparent anesthesia complications

## 2012-12-30 NOTE — Preoperative (Signed)
Beta Blockers   Reason not to administer Beta Blockers:Not Applicable 

## 2012-12-30 NOTE — Progress Notes (Signed)
Placed pt. On cpap as per order. Pt. Wears cpap at home. Pt. Did not know her home settings, so pt. Placed on auto titration mode and pt. States she is comfortable with the pressure. Pt. Is tolerating well at this time.

## 2012-12-30 NOTE — Transfer of Care (Signed)
Immediate Anesthesia Transfer of Care Note  Patient: Heather [name] [facility]  Procedure(s) Performed: Procedure(s): ATRIAL FIBRILLATION ABLATION (N/A)  Patient Location: Cath Lab  Anesthesia Type:General  Level of Consciousness: awake, alert , oriented and patient cooperative  Airway & Oxygen Therapy: Patient Spontanous Breathing and Patient connected to face mask oxygen  Post-op Assessment: Report given to PACU RN, Post -op Vital signs reviewed and stable and Patient moving all extremities  Post vital signs: Reviewed and stable  Complications: No apparent anesthesia complications

## 2012-12-30 NOTE — Discharge Summary (Signed)
ELECTROPHYSIOLOGY PROCEDURE DISCHARGE SUMMARY    Patient ID: Heather [name],  MRN: [id number], DOB/AGE: 06/25/1951 62 y.o.  Admit date: 12/30/2012 Discharge date: 12/31/2012  Primary Care Physician: Lynnea Ferrier, MD Primary Cardiologist: [name], MD  Primary Discharge Diagnosis:  Atrial flutter status post ablation this admission  Secondary Discharge Diagnosis:  1.  Atrial fibrillation s/p PVI at [facility] 2.  Chronic anticoagulation with Eliquis 3.  Hypertension 4.  Obesity 5.  Degenerative disk disease 6.  Obstructive sleep apnea- on CPAP 7.  Hypothyroidism  Procedures This Admission:  1.  Electrophysiology study and radiofrequency catheter ablation on 12-30-2012 by Dr Johney Frame.  This study demonstrated left atrial flutter upon presentation s/p successful ablation of left atrial flutter along the left side of the interatrial septum. Additional ablation was performed along a previously placed mitral isthmus line. All 4 pulmonary veins were quiescent from a prior ablation procedure. No inducible arrhythmias following ablation. No apparent complications.  Brief HPI: Heather [name] is a 62 year old female with a h/o atrial fibrillation s/p afib ablation x 2 by Dr Sampson Goon previously at [facility] who presents for EP evaluation for possible afib ablation. She reports initially having afib more than 10 years ago. She was evaluated by Dr Sampson Goon and underwent PVI x 2. She did very well for several years post ablation. Unfortunately, she has recently developed worsening afib. She reports symptoms of palpitations and decreased exercise tolerance. She has failed medical therapy with propafenone. She has not tolerated beta blockers due to fatigue. She required cardioversion January 2014. She is taking apixaban as an anticoagulant and tolerating it well. She has been diagnosed with sleep apnea and is using her CPAP with some improvement   Risks, benefits, and alternatives to repeat ablation  were reviewed with the patient who wished to proceed.  Pre-procedurally, she underwent TEE which demonstrated mildly impaired LV function with global hypokinesis, normal LA/LAA without evidence of thrombus, trivial MR and TR.    Hospital Course:  Heather [name] underwent electrophysiology study and ablation with details as outlined above. She tolerated this procedure well without any immediate complication. She was monitored on telemetry overnight which demonstrated sinus rhythm with occasional PACs. She remains hemodynamically stable and afebrile. Her groin site is intact without significant bleeding or hematoma. She has been given discharge instructions including wound care and activity restrictions. She will follow-up in clinic in 12 weeks. She has been seen, examined and deemed stable for discharge today by Dr. Hillis Range.  Physical Exam: Vitals: Blood pressure 96/44, pulse 73, temperature 98.8 F (37.1 C), temperature source Oral, resp. rate 16, height 5\' 5"  (1.651 m), weight 205 lb (92.987 kg), SpO2 98.00%.   General: WD, well appearing 62 year old female in no acute distress. Heart: RRR. No murmur, rub or S3.  Lungs: CTA bilaterally. No wheezes, rales or rhonchi. Abdomen: Soft, nondistended. Extremities: No cyanosis, clubbing or edema. Pedal pulses 2+ bilaterally. Groin site intact without bleeding or hematoma.  Labs:   Lab Results  Component Value Date   WBC 5.3 12/22/2012   HGB 11.9* 12/22/2012   HCT 36.0 12/22/2012   MCV 85.1 12/22/2012   PLT 225.0 12/22/2012     Recent Labs Lab 12/31/12 0510  NA 140  K 4.1  CL 106  CO2 24  BUN 28*  CREATININE 1.13*  CALCIUM 8.7  GLUCOSE 96   Discharge Medications:    Medication List    STOP taking these medications       metoprolol  tartrate 25 MG tablet  Commonly known as:  LOPRESSOR     nebivolol 5 MG tablet  Commonly known as:  BYSTOLIC      TAKE these medications       amitriptyline 10 MG tablet  Commonly known as:  ELAVIL    Take 10 mg by mouth at bedtime.     diltiazem 120 MG 24 hr capsule  Commonly known as:  CARDIZEM CD  Take 120 mg by mouth daily.     ELIQUIS 5 MG Tabs tablet  Generic drug:  apixaban  TAKE 1 TABLET TWICE A DAY     fexofenadine 180 MG tablet  Commonly known as:  ALLEGRA  Take 180 mg by mouth daily.     guaiFENesin 600 MG 12 hr tablet  Commonly known as:  MUCINEX  Take 600 mg by mouth 2 (two) times daily as needed. For allergies.     lisinopril 20 MG tablet  Commonly known as:  PRINIVIL,ZESTRIL  Take 20 mg by mouth daily.     pantoprazole 40 MG tablet  Commonly known as:  PROTONIX  Take 1 tablet (40 mg total) by mouth daily.     propafenone 225 MG 12 hr capsule  Commonly known as:  RYTHMOL SR  Take 1 capsule (225 mg total) by mouth every 12 (twelve) hours.     vitamin C 500 MG tablet  Commonly known as:  ASCORBIC ACID  Take 500 mg by mouth daily.     Vitamin D3 5000 UNITS Tabs  Take 1 tablet by mouth See admin instructions. Take 1 tablet by mouth daily on Monday, Tuesday, Wednesday, Thursday, Friday.       Disposition:   Future Appointments Provider Department Dept Phone   03/29/2013 11:00 AM Laurey Morale, MD [facility] Main Office [place]) [phone]   04/04/2013 10:45 AM Hillis Range, MD [facility] Main Office [place]) [phone]     Follow-up Information   Follow up with [name], MD On 03/29/2013. (At 11:00 AM)    Contact information:   [address] [place] [address] [phone]      Follow up with Hillis Range, MD On 04/04/2013. (At 10:45 AM)    Contact information:   [address] [place] [address] [phone]     Duration of Discharge Encounter: Greater than 30 minutes including physician time.  Limmie Patricia, PA-C  12/31/2012, 7:00 AM   Hillis Range, MD

## 2012-12-30 NOTE — H&P (View-Only) (Signed)
 Primary Care Physician: [name],[name], MD Referring Physician:  Dr [name] is a 61 y.o. female with a h/o atrial fibrillation s/p afib ablation x 2 by Dr Fitzgerald previously at [facility] who presents for EP evaluation for possible afib ablation.  She reports initially having afib more than 10 years ago.  She was evaluated by Dr Fitzgerald and underwent PVI x 2.  She did very well for several years post ablation.  Unfortunately, she has recently developed worsening afib.  She reports symptoms of palpitations and decreased exercise tolerance.  She has failed medical therapy with propafenone.  She has not tolerated beta blockers due to fatigue.   She required cardioversion January 2014.  She is taking apixaban as an anticoagulant and tolerating it well. She has been diagnosed with sleep apnea and is using her CPAP with some improvement   Today, she denies symptoms of  chest pain, shortness of breath, orthopnea, PND, lower extremity edema, dizziness, presyncope, syncope, or neurologic sequela. The patient is tolerating medications without difficulties and is otherwise without complaint today.   Past Medical History  Diagnosis Date  . HTN (hypertension)   . Obesity   . Carpal tunnel syndrome   . Anemia     "onc;e; had to take iron for awhile"  . Degenerative disk disease   . History of blood transfusion     "w/both hip replacements"  . IBS (irritable bowel syndrome)   . Morton's neuroma     right foot  . Atrial fibrillation     Began in 2004.  Had ablations at [facility] in 2005 and 2007.  She is off coumadin  now with no noted recurrent atrial fibrillation since 2007. til 04/01/12; s/p initiation of Rhythmol with DCCV June 2013  . Hypothyroidism     no meds currently  . Sleep apnea 04/01/12    "dx'd just last week"  . Sleep apnea     uses CPAP  . Interstitial cystitis   . GERD (gastroesophageal reflux disease)     resolved after lap band  . Shortness of breath     Past Surgical History  Procedure Laterality Date  . Back surgery    . Carpal tunnel release  1990's    bilaterally  . Lasik    . Cardiac catheterization    . Laparoscopic gastric banding  2009  . Cardiac electrophysiology mapping and ablation  ~ 2005 and 2007    "did more 2nd time; both at [facility]"  . Tonsillectomy and adenoidectomy  1957  . Vaginal hysterectomy  2000  . Tubal ligation  1980  . Dilation and curettage of uterus  2000  . Total hip arthroplasty  ~2009; 2010    right; left  . Joint replacement      bilateral hips  . Posterior fusion lumbar spine  2000's    "nerve problems"  . Posterior fusion lumbar spine  2000's  . Cardioversion  04/02/2012    Procedure: CARDIOVERSION;  Surgeon: Dalton S McLean, MD;  Location: [facility] OR;  Service: Cardiovascular;  Laterality: N/A;  . Cardioversion  06/16/2012    Procedure: CARDIOVERSION;  Surgeon: Philip J Nahser, MD;  Location: [facility] ENDOSCOPY;  Service: Cardiovascular;  Laterality: N/A;  amanda/ebp/Beverly( or scheduling)  . Anterior and posterior repair  10/20/2012    Procedure: ANTERIOR (CYSTOCELE) AND POSTERIOR REPAIR (RECTOCELE);  Surgeon: Sandra A Rivard, MD;  Location: [facility] ORS;  Service: Gynecology;  Laterality: N/A;  2 hours  . Cardioversion    10/29/2012    Procedure: CARDIOVERSION;  Surgeon: Dalton S McLean, MD;  Location: [facility] ENDOSCOPY;  Service: Cardiovascular;  Laterality: N/A;    Current Outpatient Prescriptions  Medication Sig Dispense Refill  . amitriptyline (ELAVIL) 10 MG tablet Take 10 mg by mouth at bedtime.      . apixaban (ELIQUIS) 5 MG TABS tablet Take 1 tablet (5 mg total) by mouth 2 (two) times daily.  60 tablet  1  . Cholecalciferol (VITAMIN D3) 5000 UNITS TABS Take 1 tablet by mouth See admin instructions. Take 1 tablet by mouth daily on Monday, Tuesday, Wednesday, Thursday, Friday.      . fexofenadine (ALLEGRA) 180 MG tablet Take 180 mg by mouth daily.      . guaiFENesin (MUCINEX) 600 MG 12 hr tablet Take 600  mg by mouth 2 (two) times daily as needed. For allergies.      . meloxicam (MOBIC) 7.5 MG tablet Take 7.5 mg by mouth 2 (two) times daily as needed for pain. For knee pain.      . propafenone (RYTHMOL SR) 225 MG 12 hr capsule Take 1 capsule (225 mg total) by mouth every 12 (twelve) hours.  60 capsule  4  . vitamin C (ASCORBIC ACID) 500 MG tablet Take 500 mg by mouth daily.      . diltiazem (CARDIZEM CD) 120 MG 24 hr capsule Take 120 mg by mouth daily.      . lisinopril (PRINIVIL,ZESTRIL) 20 MG tablet Take 20 mg by mouth daily.      . nebivolol (BYSTOLIC) 5 MG tablet Take 2.5 mg by mouth daily.       No current facility-administered medications for this visit.    Allergies  Allergen Reactions  . Cephalexin Hives and Other (See Comments)    "throat started closing up"  . Rocephin (Ceftriaxone Sodium In Dextrose) Anaphylaxis  . Penicillins Rash  . Flecainide Acetate Other (See Comments)    "couldn't take it"  . Moxifloxacin     Does not remember the reaction  . Sulfonamide Derivatives     Does not remember reaction ; "was so young when I had reaction to it"    History   Social History  . Marital Status: Married    Spouse Name: N/A    Number of Children: N/A  . Years of Education: N/A   Occupational History  . Not on file.   Social History Main Topics  . Smoking status: Never Smoker   . Smokeless tobacco: Never Used  . Alcohol Use: No  . Drug Use: No  . Sexually Active: Yes     Comment: LAVH   Other Topics Concern  . Not on file   Social History Narrative   Works in data entry in [place]   Married, lives in [place]   Tobacco Use - No.    Alcohol Use - no    Family History  Problem Relation Age of Onset  . Heart attack      granfather  . Hypertension Father   . Diabetes Father   . Dementia Father   . Atrial fibrillation Mother   . Diabetes Sister   . Diabetes Brother   . Heart disease Maternal Grandmother     ROS- All systems are reviewed and  negative except as per the HPI above  Physical Exam: Filed Vitals:   12/15/12 1534  BP: 133/65  Pulse: 62  Height: 5' 5" (1.651 m)  Weight: 296 lb 3.2 oz (134.355 kg)      GEN- The patient is obese appearing, alert and oriented x 3 today.   Head- normocephalic, atraumatic Eyes-  Sclera clear, conjunctiva pink Ears- hearing intact Oropharynx- clear Neck- supple,   Lungs- Clear to ausculation bilaterally, normal work of breathing Heart- Regular rate and rhythm, no murmurs, rubs or gallops, PMI not laterally displaced GI- soft, NT, ND, + BS Extremities- no clubbing, cyanosis, or edema MS- no significant deformity or atrophy Skin- no rash or lesion Psych- euthymic mood, full affect Neuro- strength and sensation are intact  EKG, echos, Dr Kleins notes are reviewed  Assessment and Plan:  

## 2012-12-31 DIAGNOSIS — I4891 Unspecified atrial fibrillation: Secondary | ICD-10-CM

## 2012-12-31 LAB — BASIC METABOLIC PANEL
BUN: 28 mg/dL — ABNORMAL HIGH (ref 6–23)
Chloride: 106 mEq/L (ref 96–112)
GFR calc Af Amer: 60 mL/min — ABNORMAL LOW (ref >90)
Glucose, Bld: 96 mg/dL (ref 70–99)
Potassium: 4.1 mEq/L (ref 3.5–5.1)

## 2012-12-31 MED ORDER — PANTOPRAZOLE SODIUM 40 MG PO TBEC: 40.0000 mg | DELAYED_RELEASE_TABLET | Freq: Every day | ORAL | Status: DC

## 2012-12-31 NOTE — Op Note (Signed)
Heather [name], Heather [name]              ACCOUNT NO.:  [id number]  MEDICAL RECORD NO.:  [id number]  LOCATION:  2922                         FACILITY:  [facility]  PHYSICIAN:  Hillis Range, MD       DATE OF BIRTH:  [birth year]  DATE OF PROCEDURE:  12/30/2012 DATE OF DISCHARGE:                              OPERATIVE REPORT   SURGEON:  Hillis Range, MD  PREPROCEDURE DIAGNOSES: 1. Atypical atrial flutter. 2. Persistent atrial fibrillation.  POSTPROCEDURE DIAGNOSIS: 1. Atypical atrial flutter. 2. Persistent atrial fibrillation.  PROCEDURES: 1. Comprehensive EP study. 2. Coronary sinus pacing and recording. 3. Three-dimensional mapping of atrial fibrillation with additional     mapping and ablation of atypical atrial flutter. 4. Ablation of atrial fibrillation with additional mapping and     ablation of atypical atrial flutter. 5. Intracardiac echocardiography. 6. Transseptal puncture of an intact septum. 7. Three-dimensional rotational angiography.  INTRODUCTION:  Heather [name] is a pleasant 62 year old female with a history of symptomatic persistent atrial fibrillation.  She previously failed medical therapy and underwent atrial fibrillation ablation by Dr. Sampson Goon twice at [facility].  She did well for the past few years.  Unfortunately, she has developed recent recurrence of atrial fibrillation and atypical atrial flutter.  She therefore presents today for EP study and radiofrequency ablation.  DESCRIPTION OF PROCEDURE:  Informed and written consent was obtained and the patient was brought to the Electrophysiology lab in the fasting state.  She was adequately sedated with intravenous medications as outlined in the anesthesia report.  The patient's right groin was prepped and draped in the usual sterile fashion by the EP lab staff. Using a percutaneous Seldinger technique, two 7-French and 11-French hemostasis sheaths were placed into the right common femoral  vein. A 5-French pigtail catheter was introduced through the right common femoral vein and advanced into the inferior vena cava.  A power injection of nonionic contrast was then delivered and three-dimensional rotational angiography was performed.  This demonstrated a moderately enlarged left atrium.  The patient was noted to have 4 standard pulmonary veins with no evidence of pulmonary vein stenosis.  The angiographic catheter was then removed.  Reprocessing of the image was then performed at an independent workstation and then merged to the [employer].   A 7-French decapolar Biosense Webster coronary sinus catheter was introduced through the right common femoral vein and advanced into the coronary sinus for recording and pacing from this location.  A 6-French quadripolar Josephson catheter was introduced through the right common femoral vein and advanced into the right ventricle for recording and pacing.  This catheter was then pulled back to the His bundle location. The patient presented to the Electrophysiology Lab in atypical atrial flutter.  The average RR interval was 423 milliseconds.  The QRS measured 89 milliseconds and a QT interval measured 311 milliseconds. The HV interval measured 40 milliseconds.  The coronary sinus activation sequence was slightly proximal to distal with a cycle length of 220 msec.  A 3.5 mm Biosense Webster EZ [employer] ablation catheter was introduced through the right common femoral vein and advanced into the right atrium.  Entrainment was performed from the right atrium  which revealed a long post pacing interval when compared to the tachycardia cycle length suggesting that the atrial flutter was likely left atrial in origin.  Three-dimensional electroanatomical mapping was performed.  An activation map was performed from the right atrium which revealed that the activation arose from the left atrium.  A 10-French Biosense Webster AcuNav  intracardiac echocardiography catheter was introduced through the right common femoral vein and advanced into the right atrium.  Intracardiac echocardiography was performed, which revealed a moderately enlarged left atrium.  There were 4 pulmonary veins with no evidence of pulmonary vein stenosis.  The middle right common femoral vein sheath was exchanged for an 8.5-French SL2 transseptal sheath and transseptal access was achieved in a standard fashion using a Brockenbrough needle under biplane fluoroscopy.  Once transseptal access had been achieved, heparin was administered intravenously and intra-arterially in order to maintain an ACT of greater than 350 seconds throughout the procedure.  A duodecapolar circular mapping catheter was introduced through the transseptal sheath and positioned over the mouth of all 4 pulmonary veins.  Three- dimensional electroanatomical mapping of the pulmonary veins was performed, which revealed that there was no electrical activity within the pulmonary veins at baseline, and they were quiescent from a prior ablation procedure.  The 3.5 mm Biosense Webster EZ [employer] ablation catheter was then advanced over to the left atrium.  Three- dimensional electroanatomical mapping of atypical left atrial flutter was then performed.  This initially demonstrated counterclockwise annular rotation around the mitral valve anulus, however, it appeared that this was Actually driven by a microreentrant circuit along the septum of the left atrium. Voltage mapping revealed a scar from a prior mitral isthmus line which was not complete.  I completed this isthmus line using radiofrequency current.  At that point, I delivered radiofrequency current from the right superior pulmonary vein to the mitral valve anulus.  The tachycardia slowed but did not terminate.  Additional radiofrequency current was delivered from this line across the roof of the left atrium over to the  mitral annular line in order to connect the circuit.  The tachycardia slowed further.  Additional radiofrequency current was then delivered along the mitral valve anulus at approximately 11 o'clock and this was extended down to the previously made septal line.  The tachycardia slowed and then terminated during ablation.  The patient then immediately developed a second atrial flutter.  This was a different atrial flutter with distal to proximal activation and clearly representing a different left atrial flutter.  The cycle length was 220 msec.  Entrainment was attempted, however, the tachycardia terminated and could no longer be induced.   Extensive mapping and ablation of the initial atypical atrial flutter was performed today and an additional 1.5 hours was required for this complex procedure today.  The patient was again confirmed to have electrical isolation of all 4 pulmonary veins.  The ablation catheter was pulled back into the right atrium.  Following ablation, the AH interval measured 106 milliseconds with an AH interval of 56 msec. Ventricular pacing was performed, which revealed VA dissociation.  The surface EKG revealed a PR interval 198 msec with a QRS duration of 107 msec and a QT interval of 424 msec.  Rapid atrial pacing was performed, which revealed an AV Wenckebach cycle length of 380 milliseconds.  Rapid atrial pacing was continued down to a cycle length of 250 milliseconds with no arrhythmias observed.  The procedure was therefore considered completed.  All catheters were removed and the sheaths  were aspirated and flushed.  The sheaths were removed and hemostasis was assured.  A limited bedside transthoracic echocardiogram revealed no pericardial effusion.  There were no early apparent complications.  CONCLUSIONS: 1. Left atrial flutter upon presentation. 2. Successful ablation of left atrial flutter along the left side of     the interatrial septum. 3. Additional  ablation was performed along a previously placed mitral     isthmus line. 4. All 4 pulmonary veins were quiescent from a prior ablation     procedure. 5. No inducible arrhythmias following ablation. 6. No apparent complications.     Hillis Range, MD     JA/MEDQ  D:  12/30/2012  T:  12/31/2012  Job:  [id number]  cc:   Duke Salvia, MD, [facility]

## 2013-01-03 ENCOUNTER — Telehealth: Payer: Self-pay | Admitting: Internal Medicine

## 2013-01-03 NOTE — Telephone Encounter (Signed)
New problem   Last night around 10:30p pt states her heart starting racing again & wants to know if she should start back taking the metoprolol

## 2013-01-03 NOTE — Telephone Encounter (Signed)
Spoke with patient who c/o heart racing; states she feel like she did last week prior to ablation.  Patient's husband checked her pulse and reports approximately 60 bpm but very irregular.  Patient c/o SOB, denies chest discomfort; states she feels like she will pass out with activity.  Patient advised that Dr. Johney Frame out of office today but Dr. Shirlee Latch (primary cardiologist) is in office and I will seek his advice.  Patient verbalizes understanding that I will call back with MD instruction.

## 2013-01-03 NOTE — Telephone Encounter (Signed)
Spoke with Dr. Shirlee Latch since no EP providers in office today.  Patient verbalizes understanding of Dr. Alford Highland instructions to resume Metoprolol 25 mg Daily and to let us know if she does not feel better after taking medication today and tomorrow. Patient states while she was waiting for our return phone call that her husband visualized her carotid artery beating 140 bpm.

## 2013-01-04 NOTE — Telephone Encounter (Signed)
Metoprolol did not help, so she took an extra Diltiazem 120mg  and within the hour it had slowed back down to normal and been normal since.  She will call us back with further problems and keep her follow up as scheduled.  She is aware that during the healing phase after the ablation there can be some irregularities

## 2013-02-17 ENCOUNTER — Other Ambulatory Visit: Payer: Self-pay | Admitting: Cardiology

## 2013-03-23 ENCOUNTER — Ambulatory Visit: Payer: BC Managed Care – PPO | Admitting: Internal Medicine

## 2013-03-23 ENCOUNTER — Telehealth (INDEPENDENT_AMBULATORY_CARE_PROVIDER_SITE_OTHER): Payer: Self-pay

## 2013-03-23 NOTE — Telephone Encounter (Signed)
V/M to call  To schedule F/U  appt with DR. Ezzard Standing ask for [employer]

## 2013-03-24 ENCOUNTER — Ambulatory Visit (INDEPENDENT_AMBULATORY_CARE_PROVIDER_SITE_OTHER): Payer: BC Managed Care – PPO | Admitting: Family Medicine

## 2013-03-24 ENCOUNTER — Encounter: Payer: Self-pay | Admitting: Family Medicine

## 2013-03-24 VITALS — BP 128/84 | HR 74 | Temp 98.0°F | Resp 16 | Wt 301.0 lb

## 2013-03-24 DIAGNOSIS — N3010 Interstitial cystitis (chronic) without hematuria: Secondary | ICD-10-CM

## 2013-03-24 DIAGNOSIS — Z13220 Encounter for screening for lipoid disorders: Secondary | ICD-10-CM

## 2013-03-24 DIAGNOSIS — Z2911 Encounter for prophylactic immunotherapy for respiratory syncytial virus (RSV): Secondary | ICD-10-CM

## 2013-03-24 NOTE — Progress Notes (Signed)
Subjective:    Patient ID: Heather [name], female    DOB: 09-23-1951, 62 y.o.   MRN: [id number]  HPI Patient has a history of interstitial cystitis for which she takes elavil 10 mg poqday for pain.  She has been pain free for some time and would now like to wean off elavil.  She is also due to fasting lab work.  Past Medical History  Diagnosis Date  . HTN (hypertension)   . Obesity   . Carpal tunnel syndrome   . Anemia     "onc;e; had to take iron for awhile"  . Degenerative disk disease   . History of blood transfusion     "w/both hip replacements"  . IBS (irritable bowel syndrome)   . Morton's neuroma     right foot  . Atrial fibrillation     Began in 2004.  Had ablations at [facility] in 2005 and 2007.  She is off coumadin  now with no noted recurrent atrial fibrillation since 2007. til 04/01/12; s/p initiation of Rhythmol with DCCV June 2013  . Hypothyroidism     no meds currently  . Sleep apnea 04/01/12    "dx'd just last week"  . Sleep apnea     uses CPAP  . Interstitial cystitis   . GERD (gastroesophageal reflux disease)     resolved after lap band  . Shortness of breath    Current Outpatient Prescriptions on File Prior to Visit  Medication Sig Dispense Refill  . amitriptyline (ELAVIL) 10 MG tablet Take 10 mg by mouth at bedtime.      . Cholecalciferol (VITAMIN D3) 5000 UNITS TABS Take 1 tablet by mouth See admin instructions. Take 1 tablet by mouth daily on Monday, Tuesday, Wednesday, Thursday, Friday.      . diltiazem (CARDIZEM CD) 120 MG 24 hr capsule Take 120 mg by mouth daily.      Marland Kitchen ELIQUIS 5 MG TABS tablet TAKE 1 TABLET TWICE A DAY  60 tablet  3  . fexofenadine (ALLEGRA) 180 MG tablet Take 180 mg by mouth daily.      Marland Kitchen guaiFENesin (MUCINEX) 600 MG 12 hr tablet Take 600 mg by mouth 2 (two) times daily as needed. For allergies.      Marland Kitchen lisinopril (PRINIVIL,ZESTRIL) 20 MG tablet Take 20 mg by mouth daily.      . propafenone (RYTHMOL SR) 225 MG 12 hr capsule Take 1  capsule (225 mg total) by mouth every 12 (twelve) hours.  60 capsule  4  . vitamin C (ASCORBIC ACID) 500 MG tablet Take 500 mg by mouth daily.       No current facility-administered medications on file prior to visit.   Allergies  Allergen Reactions  . Cephalexin Hives and Other (See Comments)    "throat started closing up"  . Rocephin (Ceftriaxone Sodium In Dextrose) Anaphylaxis  . Penicillins Rash  . Flecainide Acetate Other (See Comments)    "couldn't take it"  . Moxifloxacin     Does not remember the reaction  . Sulfonamide Derivatives     Does not remember reaction ; "was so young when I had reaction to it"   History   Social History  . Marital Status: Married    Spouse Name: N/A    Number of Children: N/A  . Years of Education: N/A   Occupational History  . Not on file.   Social History Main Topics  . Smoking status: Never Smoker   . Smokeless tobacco: Never  Used  . Alcohol Use: No  . Drug Use: No  . Sexually Active: Yes     Comment: LAVH   Other Topics Concern  . 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    Alcohol Use - no    Review of Systems  All other systems reviewed and are negative.       Objective:   Physical Exam  Vitals reviewed. Neck: Neck supple. No thyromegaly present.  Cardiovascular: Normal rate and normal heart sounds.  An irregularly irregular rhythm present.  No murmur heard. Pulmonary/Chest: Effort normal and breath sounds normal. No respiratory distress. She has no wheezes. She has no rales. She exhibits no tenderness.  Abdominal: Soft. Bowel sounds are normal.  Lymphadenopathy:    She has no cervical adenopathy.          Assessment & Plan:  1. Interstitial cystitis Recommended the patient decrease elavil to 5 mg poqday for 2 weeks and then 5 mg poqod for 2 weeks and then stop. - CBC with Differential; Future - CBC with  Differential  2. Screening cholesterol level Return fasting for a CMP and lipid panel. - COMPLETE METABOLIC PANEL WITH GFR; Future - Lipid panel; Future - COMPLETE METABOLIC PANEL WITH GFR - Lipid panel

## 2013-03-29 ENCOUNTER — Encounter: Payer: Self-pay | Admitting: Cardiology

## 2013-03-29 ENCOUNTER — Encounter: Payer: Self-pay | Admitting: *Deleted

## 2013-03-29 ENCOUNTER — Ambulatory Visit (INDEPENDENT_AMBULATORY_CARE_PROVIDER_SITE_OTHER): Payer: BC Managed Care – PPO | Admitting: Cardiology

## 2013-03-29 VITALS — BP 124/86 | HR 104 | Ht 65.0 in | Wt 300.0 lb

## 2013-03-29 DIAGNOSIS — I4892 Unspecified atrial flutter: Secondary | ICD-10-CM

## 2013-03-29 DIAGNOSIS — I5022 Chronic systolic (congestive) heart failure: Secondary | ICD-10-CM

## 2013-03-29 DIAGNOSIS — I509 Heart failure, unspecified: Secondary | ICD-10-CM

## 2013-03-29 DIAGNOSIS — I4891 Unspecified atrial fibrillation: Secondary | ICD-10-CM

## 2013-03-29 MED ORDER — METOPROLOL SUCCINATE ER 50 MG PO TB24: ORAL_TABLET | ORAL | Status: DC

## 2013-03-29 MED ORDER — POTASSIUM CHLORIDE ER 10 MEQ PO TBCR: 10.0000 meq | EXTENDED_RELEASE_TABLET | Freq: Every day | ORAL | Status: DC

## 2013-03-29 MED ORDER — FUROSEMIDE 20 MG PO TABS: 20.0000 mg | ORAL_TABLET | Freq: Every day | ORAL | Status: DC

## 2013-03-29 NOTE — Patient Instructions (Addendum)
Stop metoprolol tartrate(lopressor).  Start metoprolol succinate (Toprol XL) 75mg  daily. This will be 1 and 1/2 of a 50mg  tablet daily.  Start lasix(furosemide) 20 mg daily.  Start KCL (potassium) 10 mEq daily.  Your physician has recommended that you have a Cardioversion (DCCV). Electrical Cardioversion uses a jolt of electricity to your heart either through paddles or wired patches attached to your chest. This is a controlled, usually prescheduled, procedure. Defibrillation is done under light anesthesia in the hospital, and you usually go home the day of the procedure. This is done to get your heart back into a normal rhythm. You are not awake for the procedure. Please see the instruction sheet given to you today. Wednesday June 18,2014    Your physician recommends that you return for lab work in: about 10 days--BMET/BNP.   Your physician has requested that you have an echocardiogram. Echocardiography is a painless test that uses sound waves to create images of your heart. It provides your doctor with information about the size and shape of your heart and how well your heart's chambers and valves are working. This procedure takes approximately one hour. There are no restrictions for this procedure. 2 WEEKS after the cardioversion  scheduled for 03/30/13   Your physician recommends that you schedule a follow-up appointment with Dr Johney Frame.

## 2013-03-29 NOTE — Progress Notes (Signed)
Patient ID: MILA PAIR, female   DOB: November 02, 1950, 62 y.o.   MRN: [id number] PCP: Dr. Tanya Nones  62 yo with history of morbid obesity s/p lap band surgery and atrial fibrillation s/p ablation presents for cardiology followup.  Patient developed atrial fibrillation in 2004.  She had a cath in 2004 showing no significant CAD.  She had atrial fibrillation ablation in 2005 with a redo in 2007.  She has had no documented atrial fibrillation since 2007 prior to last appointment.  In 2013, Mrs Coupland was noted to be in coarse atrial fibrillation with HR in the 90s.   I started her on apixaban and Toprol XL 25 mg daily.  The atrial fibrillation was persistent and seemed to be causing increased fatigue.  I planned to start her on dofetilide, but her QT interval was too long.  Instead, I started her on propafenone and cardioverted her to NSR in 6/13.  In 9/13, she was in atrial flutter.  We increased her propafenone to 325 mg bid and cardioverted her to NSR.  Lexiscan myoview done given use of Ic agent showed soft tissue attenuation but no evidence for ischemia or infarction in 6/13.  She had increased dyspnea with propafenone at 325 mg bid, so it was decreased back to 225 mg bid.  She had recurrent atrial fibrillation and saw Dr. Johney Frame who did a redo atrial fibrillation ablation in 3/14.  He also ablated her atypical atrial flutter.  TEE prior to procedure showed EF 40-45%.  Today, patient is back in coarse atrial flutter.  She has felt symptomatically worse for a week or so.  She is short of breath walking to the mailbox.  Weight is up 4 lbs.  No orthopnea or PND.  No chest pain.  No syncope.  She has been using her CPAP for OSA.  She still feels "bad" when she takes her propafenone.  She has tried taking Lasix and says that this makes her feel better.   ECG: Atypical atrial flutter with rate 104 at rest.   Labs (3/11): BNP 65, K 4.2, creatinine 0.83, TSH normal, HCT 39.1 Labs (5/13): K 3.9, creaitnine 1.0, HCT  37.9 Labs (6/13): K 3.8, creatinine 0.85 Labs (9/13): K 4.3, creatinine 0.8 Labs (3/14): K 4.1, creatinine 1.13  Allergies (verified):  1)  ! Keflex 2)  ! Sulfa 3)  ! Pcn 4)  ! * Avelox 5)  ! Flecainide Acetate  Past Medical History: 1. L4/ L5 disk disease, s/p surgery 2. hypertension 3. Obesity: s/p Lap band surgery 7/09 4. Atrial fibrillation: Began in 2004.  Had ablations at [facility] in 2005 and 2007.  Unable to tolerate flecainide.  Back in atrial fibrillation in 5/13.  QT too long for dofetilide.  Propafenone started with DCCV to NSR in 6/13.  Atrial flutter in 9/13 with cardioversion back to NSR.  Dyspnea with propafenone 325 mg bid, can tolerate 225 mg bid.  Atrial fibrillation and atypical atrial flutter ablation in 3/14.  Back in atypical atrial flutter in 6/14.  5. Left heart cath (2004): No significant CAD, false + cardiolite. EF 60%.  Lexiscan myoview (6/13) with EF 60%, soft tissue attenuation noted but no ischemia or infarction.  6. Cardiomyopathy: Echo (3/11): LV EF 55-60%, no significant valvular dysfunction, RV-RA gradient 26 mmHg. TEE (3/14): EF 40-45% (was in atrial fibrillation).  7. Hypothyroidism 8. Right hip replacement 10/10, left hip replacement 10/11 9. GERD: Resolved after lap band 10. Interstitial cystitis  Family History: Grandfather with 6  heart attacks, she is not sure what age he first had a heart attack.  Father with hypertension and diabetes, mother on medicines for irregular heart beat.   Family history negative for cancer or thyroid disease.  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  Married, lives in [place] Tobacco Use - No.  Alcohol Use - no  ROS:  All systems reviewed and negative except as per HPI.   Current Outpatient Prescriptions  Medication Sig Dispense Refill  . amitriptyline (ELAVIL) 10 MG tablet Take 10 mg by mouth at bedtime.      . Cholecalciferol (VITAMIN D3) 5000 UNITS TABS Take 1 tablet by  mouth See admin instructions. Take 1 tablet by mouth daily on Monday, Tuesday, Wednesday, Thursday, Friday.      . diltiazem (CARDIZEM CD) 120 MG 24 hr capsule Take 120 mg by mouth daily.      Marland Kitchen ELIQUIS 5 MG TABS tablet TAKE 1 TABLET TWICE A DAY  60 tablet  3  . fexofenadine (ALLEGRA) 180 MG tablet Take 180 mg by mouth daily.      Marland Kitchen guaiFENesin (MUCINEX) 600 MG 12 hr tablet Take 600 mg by mouth 2 (two) times daily as needed. For allergies.      Marland Kitchen lisinopril (PRINIVIL,ZESTRIL) 20 MG tablet Take 20 mg by mouth daily.      . propafenone (RYTHMOL SR) 225 MG 12 hr capsule Take 1 capsule (225 mg total) by mouth every 12 (twelve) hours.  60 capsule  4  . vitamin C (ASCORBIC ACID) 500 MG tablet Take 500 mg by mouth daily.      . furosemide (LASIX) 20 MG tablet Take 1 tablet (20 mg total) by mouth daily.  30 tablet  6  . metoprolol succinate (TOPROL-XL) 50 MG 24 hr tablet 1 and 1/2 tablets (total 75mg ) daily. Take with or immediately following a meal.  45 tablet  6  . potassium chloride (K-DUR) 10 MEQ tablet Take 1 tablet (10 mEq total) by mouth daily.  30 tablet  6   No current facility-administered medications for this visit.    BP 124/86  Pulse 104  Ht 5\' 5"  (1.651 m)  Wt 300 lb (136.079 kg)  BMI 49.92 kg/m2 General:  Well developed, well nourished, in no acute distress. Obese.  Neck:  Neck supple, JVP 8-9 cm. No masses, thyromegaly or abnormal cervical nodes. Lungs:  Clear bilaterally to auscultation and percussion. Heart:  Non-displaced PMI, chest non-tender; mildly tachycardic, irregular rate and rhythm, S1, S2 without murmurs, rubs or gallops. Carotid upstroke normal, no bruit. Pedals normal pulses. No edema, no varicosities. Abdomen:  Bowel sounds positive; abdomen soft and non-tender without masses, organomegaly, or hernias noted. No hepatosplenomegaly. Obese.  Extremities:  No clubbing or cyanosis. Neurologic:  Alert and oriented x 3. Psych:  Normal affect.  Assessment/Plan:  1. Atrial  arrhythmias: Redo atrial fibrillation and flutter ablation in 3/14.  Now back in atypical atrial flutter.  She is on propafenone, diltiazem CD, and metoprolol. - She does not tolerate increasing propafenone dose.  Current dose of propafenone seems to make her short of breath.  - Stop metoprolol, start Toprol XL 75 mg daily for better rate control. - She has been on apixaban without missing doses for > 1 month.  I will arrange cardioversion this week.  - Followup with Dr. Johney Frame.  2. Obesity: Needs to work on weight loss.  She is now retired and has more time for exercise.  3.  CHF: Chronic systolic CHF with EF 40-45 on TEE in 3/14 apparently while in atrial fibrillation.  - Starting Toprol XL.   - Continue lisinopril. - She is volume overloaded on exam with NYHA class III symptoms, likely worsened by going into atrial flutter.  I will start Lasix 20 mg daily and KCl 10 mEq daily with BMET/BNP in 10 days.  - Will get echo after DCCV while in NSR.  If EF remains depressed, likely should come off diltiazem CD and go up on Toprol XL.   [name] 03/29/2013

## 2013-03-30 ENCOUNTER — Ambulatory Visit (HOSPITAL_COMMUNITY)
Admission: RE | Admit: 2013-03-30 | Discharge: 2013-03-30 | Disposition: A | Discharge status: Home / Self Care | Payer: BC Managed Care – PPO | Source: Ambulatory Visit | Attending: Cardiology | Admitting: Cardiology

## 2013-03-30 ENCOUNTER — Ambulatory Visit (HOSPITAL_COMMUNITY): Payer: BC Managed Care – PPO | Admitting: Anesthesiology

## 2013-03-30 ENCOUNTER — Encounter (HOSPITAL_COMMUNITY): Payer: Self-pay | Admitting: *Deleted

## 2013-03-30 ENCOUNTER — Other Ambulatory Visit: Payer: Self-pay | Admitting: Nurse Practitioner

## 2013-03-30 ENCOUNTER — Encounter (HOSPITAL_COMMUNITY): Payer: Self-pay | Admitting: Anesthesiology

## 2013-03-30 ENCOUNTER — Encounter (HOSPITAL_COMMUNITY)
Admission: RE | Disposition: A | Discharge status: Home / Self Care | Payer: Self-pay | Source: Ambulatory Visit | Attending: Cardiology

## 2013-03-30 DIAGNOSIS — Z8249 Family history of ischemic heart disease and other diseases of the circulatory system: Secondary | ICD-10-CM | POA: Insufficient documentation

## 2013-03-30 DIAGNOSIS — Z9884 Bariatric surgery status: Secondary | ICD-10-CM | POA: Insufficient documentation

## 2013-03-30 DIAGNOSIS — K219 Gastro-esophageal reflux disease without esophagitis: Secondary | ICD-10-CM | POA: Insufficient documentation

## 2013-03-30 DIAGNOSIS — Z881 Allergy status to other antibiotic agents status: Secondary | ICD-10-CM | POA: Insufficient documentation

## 2013-03-30 DIAGNOSIS — Z6841 Body Mass Index (BMI) 40.0 and over, adult: Secondary | ICD-10-CM | POA: Insufficient documentation

## 2013-03-30 DIAGNOSIS — N3010 Interstitial cystitis (chronic) without hematuria: Secondary | ICD-10-CM | POA: Insufficient documentation

## 2013-03-30 DIAGNOSIS — Z883 Allergy status to other anti-infective agents status: Secondary | ICD-10-CM | POA: Insufficient documentation

## 2013-03-30 DIAGNOSIS — Z7901 Long term (current) use of anticoagulants: Secondary | ICD-10-CM | POA: Insufficient documentation

## 2013-03-30 DIAGNOSIS — I428 Other cardiomyopathies: Secondary | ICD-10-CM | POA: Insufficient documentation

## 2013-03-30 DIAGNOSIS — I4892 Unspecified atrial flutter: Secondary | ICD-10-CM | POA: Insufficient documentation

## 2013-03-30 DIAGNOSIS — Z888 Allergy status to other drugs, medicaments and biological substances status: Secondary | ICD-10-CM | POA: Insufficient documentation

## 2013-03-30 DIAGNOSIS — Z96649 Presence of unspecified artificial hip joint: Secondary | ICD-10-CM | POA: Insufficient documentation

## 2013-03-30 DIAGNOSIS — E039 Hypothyroidism, unspecified: Secondary | ICD-10-CM | POA: Insufficient documentation

## 2013-03-30 DIAGNOSIS — I509 Heart failure, unspecified: Secondary | ICD-10-CM | POA: Insufficient documentation

## 2013-03-30 DIAGNOSIS — I5022 Chronic systolic (congestive) heart failure: Secondary | ICD-10-CM | POA: Insufficient documentation

## 2013-03-30 DIAGNOSIS — Z882 Allergy status to sulfonamides status: Secondary | ICD-10-CM | POA: Insufficient documentation

## 2013-03-30 DIAGNOSIS — Z880 Allergy status to penicillin: Secondary | ICD-10-CM | POA: Insufficient documentation

## 2013-03-30 DIAGNOSIS — I10 Essential (primary) hypertension: Secondary | ICD-10-CM | POA: Insufficient documentation

## 2013-03-30 HISTORY — PX: CARDIOVERSION: SHX1299

## 2013-03-30 LAB — BASIC METABOLIC PANEL
BUN: 21 mg/dL (ref 6–23)
Creatinine, Ser: 0.81 mg/dL (ref 0.50–1.10)
GFR calc non Af Amer: 77 mL/min — ABNORMAL LOW (ref >90)
Glucose, Bld: 91 mg/dL (ref 70–99)
Potassium: 4.2 mEq/L (ref 3.5–5.1)

## 2013-03-30 SURGERY — CARDIOVERSION
Anesthesia: General

## 2013-03-30 MED ORDER — PROPOFOL 10 MG/ML IV BOLUS
INTRAVENOUS | Status: DC | PRN
  Administered 2013-03-30: 40 mg via INTRAVENOUS
  Administered 2013-03-30: 60 mg via INTRAVENOUS
  Administered 2013-03-30: 50 mg via INTRAVENOUS

## 2013-03-30 MED ORDER — FENTANYL CITRATE 0.05 MG/ML IJ SOLN
INTRAMUSCULAR | Status: AC
  Filled 2013-03-30: qty 2

## 2013-03-30 MED ORDER — SODIUM CHLORIDE 0.9 % IV SOLN
250.0000 mL | INTRAVENOUS | Status: DC
  Administered 2013-03-30: 500 mL via INTRAVENOUS

## 2013-03-30 MED ORDER — SODIUM CHLORIDE 0.9 % IJ SOLN: 3.0000 mL | INTRAMUSCULAR | Status: DC | PRN

## 2013-03-30 MED ORDER — SODIUM CHLORIDE 0.9 % IJ SOLN: 3.0000 mL | Freq: Two times a day (BID) | INTRAMUSCULAR | Status: DC

## 2013-03-30 MED ORDER — MIDAZOLAM HCL 5 MG/ML IJ SOLN
INTRAMUSCULAR | Status: AC
  Filled 2013-03-30: qty 2

## 2013-03-30 NOTE — Preoperative (Signed)
Beta Blockers   Reason not to administer Beta Blockers:metoprolol 03/30/13

## 2013-03-30 NOTE — Interval H&P Note (Signed)
History and Physical Interval Note:  03/30/2013 1:09 PM  Heather [name]  has presented today for surgery, with the diagnosis of aflutter  The various methods of treatment have been discussed with the patient and family. After consideration of risks, benefits and other options for treatment, the patient has consented to  Procedure(s): CARDIOVERSION (N/A) as a surgical intervention .  The patient's history has been reviewed, patient examined, no change in status, stable for surgery.  I have reviewed the patient's chart and labs.  Questions were answered to the patient's satisfaction.     Dulcie Gammon [employer]

## 2013-03-30 NOTE — Anesthesia Postprocedure Evaluation (Signed)
  Anesthesia Post-op Note  Patient: Heather [name]  Procedure(s) Performed: Procedure(s): CARDIOVERSION (N/A)  Patient Location: Endoscopy Unit  Anesthesia Type:General  Level of Consciousness: awake, alert , oriented and patient cooperative  Airway and Oxygen Therapy: Patient Spontanous Breathing  Post-op Pain: none  Post-op Assessment: Post-op Vital signs reviewed, Patient's Cardiovascular Status Stable, Respiratory Function Stable, Patent Airway and No signs of Nausea or vomiting  Post-op Vital Signs: Reviewed and stable  Complications: No apparent anesthesia complications

## 2013-03-30 NOTE — Procedures (Signed)
Electrical Cardioversion Procedure Note Heather [name] [id number] 15-Mar-1951  Procedure: Electrical Cardioversion Indications:  Atrial Flutter  Procedure Details Consent: Risks of procedure as well as the alternatives and risks of each were explained to the (patient/caregiver).  Consent for procedure obtained. Time Out: Verified patient identification, verified procedure, site/side was marked, verified correct patient position, special equipment/implants available, medications/allergies/relevent history reviewed, required imaging and test results available.  Performed  Patient placed on cardiac monitor, pulse oximetry, supplemental oxygen as necessary.  Sedation given: Propofol 150 mg IV Pacer pads placed anterior and posterior chest.  Cardioverted 1 time(s).  Cardioverted at 150J.  Evaluation Findings: Post procedure EKG shows: NSR Complications: None Patient did tolerate procedure well.   [name] 03/30/2013, 1:17 PM

## 2013-03-30 NOTE — H&P (View-Only) (Signed)
Patient ID: Heather [name], female   DOB: 02/16/1951, 61 y.o.   MRN: [id number] PCP: Dr. [name]  61 yo with history of morbid obesity s/p lap band surgery and atrial fibrillation s/p ablation presents for cardiology followup.  Patient developed atrial fibrillation in 2004.  She had a cath in 2004 showing no significant CAD.  She had atrial fibrillation ablation in 2005 with a redo in 2007.  She has had no documented atrial fibrillation since 2007 prior to last appointment.  In 2013, Heather [name] was noted to be in coarse atrial fibrillation with HR in the 90s.   I started her on apixaban and Toprol XL 25 mg daily.  The atrial fibrillation was persistent and seemed to be causing increased fatigue.  I planned to start her on dofetilide, but her QT interval was too long.  Instead, I started her on propafenone and cardioverted her to NSR in 6/13.  In 9/13, she was in atrial flutter.  We increased her propafenone to 325 mg bid and cardioverted her to NSR.  Lexiscan myoview done given use of Ic agent showed soft tissue attenuation but no evidence for ischemia or infarction in 6/13.  She had increased dyspnea with propafenone at 325 mg bid, so it was decreased back to 225 mg bid.  She had recurrent atrial fibrillation and saw Dr. Allred who did a redo atrial fibrillation ablation in 3/14.  He also ablated her atypical atrial flutter.  TEE prior to procedure showed EF 40-45%.  Today, patient is back in coarse atrial flutter.  She has felt symptomatically worse for a week or so.  She is short of breath walking to the mailbox.  Weight is up 4 lbs.  No orthopnea or PND.  No chest pain.  No syncope.  She has been using her CPAP for OSA.  She still feels "bad" when she takes her propafenone.  She has tried taking Lasix and says that this makes her feel better.   ECG: Atypical atrial flutter with rate 104 at rest.   Labs (3/11): BNP 65, K 4.2, creatinine 0.83, TSH normal, HCT 39.1 Labs (5/13): K 3.9, creaitnine 1.0, HCT  37.9 Labs (6/13): K 3.8, creatinine 0.85 Labs (9/13): K 4.3, creatinine 0.8 Labs (3/14): K 4.1, creatinine 1.13  Allergies (verified):  1)  ! Keflex 2)  ! Sulfa 3)  ! Pcn 4)  ! * Avelox 5)  ! Flecainide Acetate  Past Medical History: 1. L4/ L5 disk disease, s/p surgery 2. hypertension 3. Obesity: s/p Lap band surgery 7/09 4. Atrial fibrillation: Began in 2004.  Had ablations at [facility] in 2005 and 2007.  Unable to tolerate flecainide.  Back in atrial fibrillation in 5/13.  QT too long for dofetilide.  Propafenone started with DCCV to NSR in 6/13.  Atrial flutter in 9/13 with cardioversion back to NSR.  Dyspnea with propafenone 325 mg bid, can tolerate 225 mg bid.  Atrial fibrillation and atypical atrial flutter ablation in 3/14.  Back in atypical atrial flutter in 6/14.  5. Left heart cath (2004): No significant CAD, false + cardiolite. EF 60%.  Lexiscan myoview (6/13) with EF 60%, soft tissue attenuation noted but no ischemia or infarction.  6. Cardiomyopathy: Echo (3/11): LV EF 55-60%, no significant valvular dysfunction, RV-RA gradient 26 mmHg. TEE (3/14): EF 40-45% (was in atrial fibrillation).  7. Hypothyroidism 8. Right hip replacement 10/10, left hip replacement 10/11 9. GERD: Resolved after lap band 10. Interstitial cystitis  Family History: Grandfather with 6   heart attacks, she is not sure what age he first had a heart attack.  Father with hypertension and diabetes, mother on medicines for irregular heart beat.   Family history negative for cancer or thyroid disease.  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  Married, lives in [place] Tobacco Use - No.  Alcohol Use - no  ROS:  All systems reviewed and negative except as per HPI.   Current Outpatient Prescriptions  Medication Sig Dispense Refill  . amitriptyline (ELAVIL) 10 MG tablet Take 10 mg by mouth at bedtime.      . Cholecalciferol (VITAMIN D3) 5000 UNITS TABS Take 1 tablet by  mouth See admin instructions. Take 1 tablet by mouth daily on Monday, Tuesday, Wednesday, Thursday, Friday.      . diltiazem (CARDIZEM CD) 120 MG 24 hr capsule Take 120 mg by mouth daily.      . ELIQUIS 5 MG TABS tablet TAKE 1 TABLET TWICE A DAY  60 tablet  3  . fexofenadine (ALLEGRA) 180 MG tablet Take 180 mg by mouth daily.      . guaiFENesin (MUCINEX) 600 MG 12 hr tablet Take 600 mg by mouth 2 (two) times daily as needed. For allergies.      . lisinopril (PRINIVIL,ZESTRIL) 20 MG tablet Take 20 mg by mouth daily.      . propafenone (RYTHMOL SR) 225 MG 12 hr capsule Take 1 capsule (225 mg total) by mouth every 12 (twelve) hours.  60 capsule  4  . vitamin C (ASCORBIC ACID) 500 MG tablet Take 500 mg by mouth daily.      . furosemide (LASIX) 20 MG tablet Take 1 tablet (20 mg total) by mouth daily.  30 tablet  6  . metoprolol succinate (TOPROL-XL) 50 MG 24 hr tablet 1 and 1/2 tablets (total 75mg) daily. Take with or immediately following a meal.  45 tablet  6  . potassium chloride (K-DUR) 10 MEQ tablet Take 1 tablet (10 mEq total) by mouth daily.  30 tablet  6   No current facility-administered medications for this visit.    BP 124/86  Pulse 104  Ht 5' 5" (1.651 m)  Wt 300 lb (136.079 kg)  BMI 49.92 kg/m2 General:  Well developed, well nourished, in no acute distress. Obese.  Neck:  Neck supple, JVP 8-9 cm. No masses, thyromegaly or abnormal cervical nodes. Lungs:  Clear bilaterally to auscultation and percussion. Heart:  Non-displaced PMI, chest non-tender; mildly tachycardic, irregular rate and rhythm, S1, S2 without murmurs, rubs or gallops. Carotid upstroke normal, no bruit. Pedals normal pulses. No edema, no varicosities. Abdomen:  Bowel sounds positive; abdomen soft and non-tender without masses, organomegaly, or hernias noted. No hepatosplenomegaly. Obese.  Extremities:  No clubbing or cyanosis. Neurologic:  Alert and oriented x 3. Psych:  Normal affect.  Assessment/Plan:  1. Atrial  arrhythmias: Redo atrial fibrillation and flutter ablation in 3/14.  Now back in atypical atrial flutter.  She is on propafenone, diltiazem CD, and metoprolol. - She does not tolerate increasing propafenone dose.  Current dose of propafenone seems to make her short of breath.  - Stop metoprolol, start Toprol XL 75 mg daily for better rate control. - She has been on apixaban without missing doses for > 1 month.  I will arrange cardioversion this week.  - Followup with Dr. Allred.  2. Obesity: Needs to work on weight loss.  She is now retired and has more time for exercise.  3.   CHF: Chronic systolic CHF with EF 40-45 on TEE in 3/14 apparently while in atrial fibrillation.  - Starting Toprol XL.   - Continue lisinopril. - She is volume overloaded on exam with NYHA class III symptoms, likely worsened by going into atrial flutter.  I will start Lasix 20 mg daily and KCl 10 mEq daily with BMET/BNP in 10 days.  - Will get echo after DCCV while in NSR.  If EF remains depressed, likely should come off diltiazem CD and go up on Toprol XL.   Lynne Takemoto 03/29/2013   

## 2013-03-30 NOTE — Anesthesia Preprocedure Evaluation (Addendum)
Anesthesia Evaluation  Patient identified by MRN, date of birth, ID band Patient awake    Reviewed: Allergy & Precautions, H&P , NPO status , Patient's Chart, lab work & pertinent test results, reviewed documented beta blocker date and time   History of Anesthesia Complications Negative for: history of anesthetic complications  Airway Mallampati: II TM Distance: >3 FB Neck ROM: Full    Dental  (+) Teeth Intact and Dental Advisory Given   Pulmonary shortness of breath and with exertion, sleep apnea and Continuous Positive Airway Pressure Ventilation ,  breath sounds clear to auscultation  Pulmonary exam normal       Cardiovascular hypertension, Pt. on medications and Pt. on home beta blockers +CHF + dysrhythmias Atrial Fibrillation Rhythm:Irregular Rate:Normal  ECHO: EF 40-45%   Neuro/Psych PSYCHIATRIC DISORDERS  Neuromuscular disease    GI/Hepatic Neg liver ROS, GERD- (resolved)  Medicated and Controlled,  Endo/Other  Hypothyroidism Morbid obesity  Renal/GU negative Renal ROS     Musculoskeletal  (+) Arthritis -, Osteoarthritis,    Abdominal (+) + obese,   Peds  Hematology  (+) Blood dyscrasia (eliquis ), anemia ,   Anesthesia Other Findings   Reproductive/Obstetrics negative OB ROS                         Anesthesia Physical Anesthesia Plan  ASA: III  Anesthesia Plan: General   Post-op Pain Management:    Induction: Intravenous  Airway Management Planned: Mask and Natural Airway  Additional Equipment:   Intra-op Plan:   Post-operative Plan:   Informed Consent: I have reviewed the patients History and Physical, chart, labs and discussed the procedure including the risks, benefits and alternatives for the proposed anesthesia with the patient or authorized representative who has indicated his/her understanding and acceptance.   Dental advisory given  Plan Discussed with: CRNA,  Anesthesiologist and Surgeon  Anesthesia Plan Comments: (Plan routine monitors, GA for cardioversion)       Anesthesia Quick Evaluation

## 2013-03-30 NOTE — Anesthesia Procedure Notes (Addendum)
Date/Time: 03/30/2013 1:11 PM Performed by: Leona Singleton A Pre-anesthesia Checklist: Patient identified Patient Re-evaluated:Patient Re-evaluated prior to inductionOxygen Delivery Method: Circle system utilized Preoxygenation: Pre-oxygenation with 100% oxygen Intubation Type: IV induction Ventilation: Mask ventilation with difficulty Dental Injury: Teeth and Oropharynx as per pre-operative assessment  Comments: Native airway throughout.

## 2013-03-30 NOTE — Transfer of Care (Signed)
Immediate Anesthesia Transfer of Care Note  Patient: Heather [name] [facility]  Procedure(s) Performed: Procedure(s): CARDIOVERSION (N/A)  Patient Location: Endoscopy Unit  Anesthesia Type:General  Level of Consciousness: awake, alert , oriented and patient cooperative  Airway & Oxygen Therapy: Patient Spontanous Breathing  Post-op Assessment: Report given to PACU RN and Post -op Vital signs reviewed and stable  Post vital signs: Reviewed and stable  Complications: No apparent anesthesia complications

## 2013-03-31 ENCOUNTER — Encounter (HOSPITAL_COMMUNITY): Payer: Self-pay | Admitting: Cardiology

## 2013-04-04 ENCOUNTER — Encounter: Payer: BC Managed Care – PPO | Admitting: Internal Medicine

## 2013-04-08 ENCOUNTER — Other Ambulatory Visit (INDEPENDENT_AMBULATORY_CARE_PROVIDER_SITE_OTHER): Payer: BC Managed Care – PPO

## 2013-04-08 DIAGNOSIS — I4892 Unspecified atrial flutter: Secondary | ICD-10-CM

## 2013-04-08 LAB — BASIC METABOLIC PANEL
BUN: 26 mg/dL — ABNORMAL HIGH (ref 6–23)
CO2: 27 mEq/L (ref 19–32)
Chloride: 104 mEq/L (ref 96–112)
Creatinine, Ser: 1 mg/dL (ref 0.4–1.2)

## 2013-04-08 LAB — BRAIN NATRIURETIC PEPTIDE: Pro B Natriuretic peptide (BNP): 198 pg/mL — ABNORMAL HIGH (ref 0.0–100.0)

## 2013-04-13 ENCOUNTER — Ambulatory Visit (HOSPITAL_COMMUNITY): Payer: BC Managed Care – PPO | Attending: Cardiology | Admitting: Radiology

## 2013-04-13 DIAGNOSIS — I4892 Unspecified atrial flutter: Secondary | ICD-10-CM

## 2013-04-13 DIAGNOSIS — I5022 Chronic systolic (congestive) heart failure: Secondary | ICD-10-CM

## 2013-04-13 DIAGNOSIS — I10 Essential (primary) hypertension: Secondary | ICD-10-CM | POA: Insufficient documentation

## 2013-04-13 DIAGNOSIS — I4891 Unspecified atrial fibrillation: Secondary | ICD-10-CM

## 2013-04-13 DIAGNOSIS — I079 Rheumatic tricuspid valve disease, unspecified: Secondary | ICD-10-CM | POA: Insufficient documentation

## 2013-04-13 NOTE — Progress Notes (Signed)
Echocardiogram performed.  

## 2013-04-14 ENCOUNTER — Encounter: Payer: Self-pay | Admitting: Internal Medicine

## 2013-04-14 ENCOUNTER — Ambulatory Visit (INDEPENDENT_AMBULATORY_CARE_PROVIDER_SITE_OTHER): Payer: BC Managed Care – PPO | Admitting: Internal Medicine

## 2013-04-14 VITALS — BP 116/74 | HR 99 | Ht 65.0 in | Wt 296.8 lb

## 2013-04-14 DIAGNOSIS — I4891 Unspecified atrial fibrillation: Secondary | ICD-10-CM

## 2013-04-14 NOTE — Patient Instructions (Addendum)
Your physician recommends that you schedule a follow-up appointment in: 3 months with Dr. Allred.  Your physician recommends that you continue on your current medications as directed. Please refer to the Current Medication list given to you today.    

## 2013-04-18 ENCOUNTER — Other Ambulatory Visit: Payer: Self-pay | Admitting: Family Medicine

## 2013-04-18 ENCOUNTER — Telehealth: Payer: Self-pay | Admitting: Family Medicine

## 2013-04-18 MED ORDER — AMITRIPTYLINE HCL 10 MG PO TABS: 10.0000 mg | ORAL_TABLET | Freq: Every day | ORAL | Status: DC

## 2013-04-18 NOTE — Telephone Encounter (Signed)
Pt aware.

## 2013-04-18 NOTE — Telephone Encounter (Signed)
Sure she can have amitriptyline 10 mg poqday #30 with 5 refills. I will e-scribe it.

## 2013-04-20 ENCOUNTER — Other Ambulatory Visit: Payer: Self-pay | Admitting: Physician Assistant

## 2013-04-21 NOTE — Progress Notes (Signed)
Primary Care Physician: Leo Grosser, MD Referring Physician:  Dr Boykin Nearing is a 62 y.o. female with a h/o atrial fibrillation s/p afib ablation x 2 by Dr Sampson Goon previously at [facility] who presents for EP follow-up.  SInce her recent cardioversion, she has done well.  She feels that her exerise tolerance is improved.   Today, she denies symptoms of  chest pain, shortness of breath, orthopnea, PND, lower extremity edema, dizziness, presyncope, syncope, or neurologic sequela. The patient is tolerating medications without difficulties and is otherwise without complaint today.   Past Medical History  Diagnosis Date  . HTN (hypertension)   . Obesity   . Carpal tunnel syndrome   . Anemia     "onc;e; had to take iron for awhile"  . Degenerative disk disease   . History of blood transfusion     "w/both hip replacements"  . IBS (irritable bowel syndrome)   . Morton's neuroma     right foot  . Atrial fibrillation     Began in 2004.  Had ablations at [facility] in 2005 and 2007.  She is off coumadin  now with no noted recurrent atrial fibrillation since 2007. til 04/01/12; s/p initiation of Rhythmol with DCCV June 2013  . Hypothyroidism     no meds currently  . Sleep apnea 04/01/12    "dx'd just last week"  . Sleep apnea     uses CPAP  . Interstitial cystitis   . GERD (gastroesophageal reflux disease)     resolved after lap band  . Shortness of breath    Past Surgical History  Procedure Laterality Date  . Back surgery    . Carpal tunnel release  1990's    bilaterally  . Lasik    . Cardiac catheterization    . Laparoscopic gastric banding  2009  . Cardiac electrophysiology mapping and ablation  ~ 2005 and 2007    "did more 2nd time; both at [facility]"  . Tonsillectomy and adenoidectomy  1957  . Vaginal hysterectomy  2000  . Tubal ligation  1980  . Dilation and curettage of uterus  2000  . Total hip arthroplasty  ~2009; 2010    right; left  .  Joint replacement      bilateral hips  . Posterior fusion lumbar spine  2000's    "nerve problems"  . Posterior fusion lumbar spine  2000's  . Cardioversion  04/02/2012    Procedure: CARDIOVERSION;  Surgeon: Laurey Morale, MD;  Location: [facility] OR;  Service: Cardiovascular;  Laterality: N/A;  . Cardioversion  06/16/2012    Procedure: CARDIOVERSION;  Surgeon: [name], MD;  Location: [facility] ENDOSCOPY;  Service: Cardiovascular;  Laterality: N/A;  amanda/ebp/Beverly( or scheduling)  . Anterior and posterior repair  10/20/2012    Procedure: ANTERIOR (CYSTOCELE) AND POSTERIOR REPAIR (RECTOCELE);  Surgeon: Esmeralda Arthur, MD;  Location: [facility] ORS;  Service: Gynecology;  Laterality: N/A;  2 hours  . Cardioversion  10/29/2012    Procedure: CARDIOVERSION;  Surgeon: Laurey Morale, MD;  Location: [facility] ENDOSCOPY;  Service: Cardiovascular;  Laterality: N/A;  . Tee without cardioversion N/A 12/29/2012    Procedure: TRANSESOPHAGEAL ECHOCARDIOGRAM (TEE);  Surgeon: Peter M Swaziland, MD;  Location: [facility] ENDOSCOPY;  Service: Cardiovascular;  Laterality: N/A;  . Cardioversion N/A 03/30/2013    Procedure: CARDIOVERSION;  Surgeon: Laurey Morale, MD;  Location: [facility] ENDOSCOPY;  Service: Cardiovascular;  Laterality: N/A;    Current Outpatient Prescriptions  Medication Sig Dispense  Refill  . Cholecalciferol (VITAMIN D3) 5000 UNITS TABS Take 1 tablet by mouth See admin instructions. Take 1 tablet by mouth daily on Monday, Tuesday, Wednesday, Thursday, Friday.      . diltiazem (CARDIZEM CD) 120 MG 24 hr capsule Take 120 mg by mouth daily.      Marland Kitchen ELIQUIS 5 MG TABS tablet TAKE 1 TABLET TWICE A DAY  60 tablet  3  . fexofenadine (ALLEGRA) 180 MG tablet Take 180 mg by mouth daily.      . furosemide (LASIX) 20 MG tablet Take 1 tablet (20 mg total) by mouth daily.  30 tablet  6  . guaiFENesin (MUCINEX) 600 MG 12 hr tablet Take 600 mg by mouth 2 (two) times daily as needed. For allergies.      . metoprolol succinate (TOPROL-XL) 50 MG 24  hr tablet 1 and 1/2 tablets (total 75mg ) daily. Take with or immediately following a meal.  45 tablet  6  . potassium chloride (K-DUR) 10 MEQ tablet Take 1 tablet (10 mEq total) by mouth daily.  30 tablet  6  . propafenone (RYTHMOL SR) 225 MG 12 hr capsule Take 1 capsule (225 mg total) by mouth every 12 (twelve) hours.  60 capsule  4  . vitamin C (ASCORBIC ACID) 500 MG tablet Take 500 mg by mouth daily.      Marland Kitchen amitriptyline (ELAVIL) 10 MG tablet Take 1 tablet (10 mg total) by mouth at bedtime.  30 tablet  5  . lisinopril (PRINIVIL,ZESTRIL) 20 MG tablet TAKE 1 TABLET EVERY DAY  30 tablet  5   No current facility-administered medications for this visit.    Allergies  Allergen Reactions  . Cephalexin Hives and Other (See Comments)    "throat started closing up"  . Rocephin (Ceftriaxone Sodium In Dextrose) Anaphylaxis  . Penicillins Rash  . Flecainide Acetate Other (See Comments)    "couldn't take it"  . Moxifloxacin     Does not remember the reaction  . Sulfonamide Derivatives     Does not remember reaction ; "was so young when I had reaction to it"    History   Social History  . Marital Status: Married    Spouse Name: N/A    Number of Children: N/A  . Years of Education: N/A   Occupational History  . Not on file.   Social History Main Topics  . Smoking status: Never Smoker   . Smokeless tobacco: Never Used  . Alcohol Use: No  . Drug Use: No  . Sexually Active: Yes     Comment: LAVH   Other Topics Concern  . 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    Alcohol Use - no    Family History  Problem Relation Age of Onset  . Heart attack      granfather  . Hypertension Father   . Diabetes Father   . Dementia Father   . Atrial fibrillation Mother   . Diabetes Sister   . Diabetes Brother   . Heart disease Maternal Grandmother     ROS- All systems are reviewed and negative  except as per the HPI above  Physical Exam: Filed Vitals:   04/14/13 1144  BP: 116/74  Pulse: 99  Height: 5\' 5"  (1.651 m)  Weight: 296 lb 12.8 oz (134.628 kg)    GEN- The patient is obese  appearing, alert and oriented x 3 today.   Head- normocephalic, atraumatic Eyes-  Sclera clear, conjunctiva pink Ears- hearing intact Oropharynx- clear Neck- supple,   Lungs- Clear to ausculation bilaterally, normal work of breathing Heart- Regular rate and rhythm, no murmurs, rubs or gallops, PMI not laterally displaced GI- soft, NT, ND, + BS Extremities- no clubbing, cyanosis, or edema MS- no significant deformity or atrophy Skin- no rash or lesion Psych- euthymic mood, full affect Neuro- strength and sensation are intact  EKG today reveals probably atypical atrial flutter.  Sinus tachycardia with first degree AV block is less likely  Assessment and Plan:

## 2013-04-21 NOTE — Assessment & Plan Note (Signed)
The patient has symptomatic paroxysmal atrial fibrillation and atypical atrial flutter.  She has failed medical therapy and previously undergone afib ablation x 2.  I performed additional ablation for atypical atrial flutter in March.  She is probably in another atypical atrial flutter today, though she is unaware. She is appropriately anticoagulated.  At this point, we will follow cons]ervatively with rate control.  I will see her back in 3 months and we will make further decisions about her care at that time.

## 2013-05-12 ENCOUNTER — Other Ambulatory Visit: Payer: Self-pay | Admitting: *Deleted

## 2013-05-12 MED ORDER — PROPAFENONE HCL ER 225 MG PO CP12: 225.0000 mg | ORAL_CAPSULE | Freq: Two times a day (BID) | ORAL | Status: DC

## 2013-05-19 ENCOUNTER — Other Ambulatory Visit: Payer: Self-pay | Admitting: Cardiology

## 2013-06-24 ENCOUNTER — Other Ambulatory Visit: Payer: Self-pay | Admitting: Cardiology

## 2013-09-07 ENCOUNTER — Other Ambulatory Visit: Payer: Self-pay | Admitting: Internal Medicine

## 2013-10-09 ENCOUNTER — Other Ambulatory Visit: Payer: Self-pay | Admitting: Family Medicine

## 2013-10-09 ENCOUNTER — Other Ambulatory Visit: Payer: Self-pay | Admitting: Cardiology

## 2013-10-11 ENCOUNTER — Other Ambulatory Visit: Payer: Self-pay | Admitting: Family Medicine

## 2013-10-11 ENCOUNTER — Other Ambulatory Visit: Payer: Self-pay | Admitting: Internal Medicine

## 2013-10-11 ENCOUNTER — Other Ambulatory Visit: Payer: Self-pay | Admitting: Cardiology

## 2013-10-11 ENCOUNTER — Other Ambulatory Visit: Payer: Self-pay | Admitting: Physician Assistant

## 2013-10-12 ENCOUNTER — Telehealth: Payer: Self-pay | Admitting: Cardiology

## 2013-10-12 NOTE — Telephone Encounter (Signed)
New Message  Pt called states that she cannot walk across the room without losing her breath and there is a squeezing between her shoulder blades in the back/// Transferred call to triage//SR

## 2013-10-12 NOTE — Telephone Encounter (Signed)
Returned call to patient she stated when she woke up this morning she had pain between shoulder blades that lasted appox 2 min.Stated she is sob and has a strange wheezing in throat.Stated she has had wheezing for 1 week.Stated she feels like her heart out of rhythm.Tresa Endo Dr.Allred's nurse spoke to [facility] he advised patient's heart has been out of rhythm since 04/2013.He advised to see PCP she might have a URI.Appointment scheduled with Rick Duff PA 10/19/13 at 3:00 pm.

## 2013-10-14 ENCOUNTER — Ambulatory Visit (INDEPENDENT_AMBULATORY_CARE_PROVIDER_SITE_OTHER): Payer: BC Managed Care – PPO | Admitting: Family Medicine

## 2013-10-14 ENCOUNTER — Encounter: Payer: Self-pay | Admitting: Family Medicine

## 2013-10-14 VITALS — BP 110/76 | HR 92 | Temp 97.5°F | Resp 18 | Ht 65.5 in | Wt 324.0 lb

## 2013-10-14 DIAGNOSIS — R061 Stridor: Secondary | ICD-10-CM

## 2013-10-14 NOTE — Progress Notes (Signed)
Subjective:    Patient ID: Heather EatonCynthia M Raboin, female    DOB: Dec 09, 1950, 63 y.o.   MRN: [id number]  HPI Patient is a very pleasant 63 year old Caucasian female who is referred here by her cardiologist due to a "wheezing/whistling" sound on expiration coming from her throat particularly when she lies down at night. She denies any shortness of breath. She denies any wheezing in the lungs. She does report a dry nonproductive cough has been going on for several months and began around the same time his nose started occurring. She denies any apnea. She denies any snoring.  He denies any acid reflux. She denies any postnasal drip or sinusitis. She denies any dysphagia or odynophagia.  She denies any weight loss or hemoptysis.  Found primarily occurs at night. She is not sure if it is due to position or if it could be because she is not preoccupied at night. Past Medical History  Diagnosis Date  . HTN (hypertension)   . Obesity   . Carpal tunnel syndrome   . Anemia     "onc;e; had to take iron for awhile"  . Degenerative disk disease   . History of blood transfusion     "w/both hip replacements"  . IBS (irritable bowel syndrome)   . Morton's neuroma     right foot  . Atrial fibrillation     Began in 2004.  [facility] and [facility].  She is off coumadin  now with no noted recurrent atrial fibrillation since 2007. til 04/01/12; s/p initiation of Rhythmol with DCCV June 2013  . Hypothyroidism     no meds currently  . Sleep apnea 04/01/12    "dx'd just last week"  . Sleep apnea     uses CPAP  . Interstitial cystitis   . GERD (gastroesophageal reflux disease)     resolved after lap band  . Shortness of breath    Past Surgical History  Procedure Laterality Date  . Back surgery    . Carpal tunnel release  1990's    bilaterally  . Lasik    . Cardiac catheterization    . Laparoscopic gastric banding  2009  . Cardiac electrophysiology mapping and ablation  ~ 2005 and 2007      "did [facility]nd time; [facility]"  . Tonsillectomy and adenoidectomy  1957  . Vaginal hysterectomy  2000  . Tubal ligation  1980  . Dilation and curettage of uterus  2000  . Total hip arthroplasty  ~2009; 2010    right; left  . Joint replacement      bilateral hips  . Posterior fusion lumbar spine  2000's    "nerve problems"  . Posterior fusion lumbar spine  2000's  . Cardioversion  04/02/2012    Procedure: CARDIOVERSION;  Surgeon: Laurey Moralealton S McLean, MD;  [facility];  Service: Cardiovascular;  Laterality: N/A;  . Cardioversion  06/16/2012    Procedure: CARDIOVERSION;  Surgeon: Vesta MixerPhilip J Nahser, MD;  [facility];  Service: Cardiovascular;  Laterality: N/A;  amanda/ebp/Beverly( or scheduling)  . Anterior and posterior repair  10/20/2012    Procedure: ANTERIOR (CYSTOCELE) AND POSTERIOR REPAIR (RECTOCELE);  Surgeon: Esmeralda ArthurSandra A Rivard, MD;  Location: [facility] ORS;  Service: Gynecology;  Laterality: N/A;  2 hours  . Cardioversion  10/29/2012    Procedure: CARDIOVERSION;  Surgeon: Laurey Moralealton S McLean, MD;  [facility];  Service: Cardiovascular;  Laterality: N/A;  . Tee without cardioversion N/A 12/29/2012  Procedure: TRANSESOPHAGEAL ECHOCARDIOGRAM (TEE);  Surgeon: Peter M Swaziland, MD;  Location: [facility] ENDOSCOPY;  Service: Cardiovascular;  Laterality: N/A;  . Cardioversion N/A 03/30/2013    Procedure: CARDIOVERSION;  Surgeon: Laurey Morale, MD;  Location: [facility] ENDOSCOPY;  Service: Cardiovascular;  Laterality: N/A;   Current Outpatient Prescriptions on File Prior to Visit  Medication Sig Dispense Refill  . amitriptyline (ELAVIL) 10 MG tablet TAKE 1 TABLET BY MOUTH AT BEDTIME.  30 tablet  5  . Cholecalciferol (VITAMIN D3) 5000 UNITS TABS Take 1 tablet by mouth See admin instructions. Take 1 tablet by mouth daily on Monday, Tuesday, Wednesday, Thursday, Friday.      . diltiazem (CARDIZEM CD) 120 MG 24 hr capsule TAKE ONE CAPSULE EVERY DAY  30 capsule  0  . ELIQUIS 5 MG TABS  tablet TAKE 1 TABLET BY MOUTH TWICE A DAY  60 tablet  3  . fexofenadine (ALLEGRA) 180 MG tablet Take 180 mg by mouth daily.      . furosemide (LASIX) 20 MG tablet Take 1 tablet (20 mg total) by mouth daily.  30 tablet  6  . guaiFENesin (MUCINEX) 600 MG 12 hr tablet Take 600 mg by mouth 2 (two) times daily as needed. For allergies.      Marland Kitchen lisinopril (PRINIVIL,ZESTRIL) 20 MG tablet TAKE 1 TABLET BY MOUTH EVERY DAY  30 tablet  0  . metoprolol succinate (TOPROL-XL) 50 MG 24 hr tablet 1 and 1/2 tablets (total 75mg ) daily. Take with or immediately following a meal.  45 tablet  6  . propafenone (RYTHMOL SR) 225 MG 12 hr capsule Take 1 capsule (225 mg total) by mouth every 12 (twelve) hours.  60 capsule  4  . vitamin C (ASCORBIC ACID) 500 MG tablet Take 500 mg by mouth daily.      . potassium chloride (K-DUR) 10 MEQ tablet Take 1 tablet (10 mEq total) by mouth daily.  30 tablet  6   No current facility-administered medications on file prior to visit.   Allergies  Allergen Reactions  . Cephalexin Hives and Other (See Comments)    "throat started closing up"  . Rocephin [Ceftriaxone Sodium In Dextrose] Anaphylaxis  . Penicillins Rash  . Flecainide Acetate Other (See Comments)    "couldn't take it"  . Moxifloxacin     Does not remember the reaction  . Sulfonamide Derivatives     Does not remember reaction ; "was so young when I had reaction to it"   History   Social History  . Marital Status: Married    Spouse Name: N/A    Number of Children: N/A  . Years of Education: N/A   Occupational History  . Not on file.   Social History Main Topics  . Smoking status: Never Smoker   . Smokeless tobacco: Never Used  . Alcohol Use: No  . Drug Use: No  . Sexual Activity: Yes     Comment: LAVH   Other Topics Concern  . 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    Alcohol Use - no      Review of  Systems  All other systems reviewed and are negative.       Objective:   Physical Exam  Vitals reviewed. Constitutional: She appears well-developed and well-nourished.  Neck: Neck supple. No JVD present. No tracheal deviation present. No thyromegaly present.  Cardiovascular: Normal rate, regular rhythm, normal heart sounds and intact distal pulses.   No murmur heard. Pulmonary/Chest: Effort normal and breath sounds normal. No stridor. No respiratory distress. She has no wheezes. She has no rales. She exhibits no tenderness.  Abdominal: Soft. Bowel sounds are normal. She exhibits no distension and no mass. There is no tenderness. There is no rebound and no guarding.  Lymphadenopathy:    She has no cervical adenopathy.          Assessment & Plan:  1. Subacute stridor Removed the whistling sound she describes represents narrowing of her upper airway rather than bronchogenic wheezing.  This could be due to several possible causes: ACE inhibitor, laryngo-esophageal reflux, postnasal drip/allergies, laryngeal mass.  Have the patient temporarily discontinue lisinopril and replace it with Benicar 40 mg by mouth daily.  Recheck in 2-3 weeks. If cough and symptoms do not improve, I will start the patient empirically on a PPI.  If symptoms continue to persist after that I would obtain a CT scan of the neck to rule out an obstructive mass.   Recheck in 2-3 weeks.

## 2013-10-16 ENCOUNTER — Other Ambulatory Visit: Payer: Self-pay | Admitting: Physician Assistant

## 2013-10-19 ENCOUNTER — Ambulatory Visit (INDEPENDENT_AMBULATORY_CARE_PROVIDER_SITE_OTHER): Payer: BC Managed Care – PPO | Admitting: Cardiology

## 2013-10-19 ENCOUNTER — Encounter: Payer: Self-pay | Admitting: Cardiology

## 2013-10-19 VITALS — BP 110/84 | HR 100 | Ht 65.5 in | Wt 328.0 lb

## 2013-10-19 DIAGNOSIS — R0609 Other forms of dyspnea: Secondary | ICD-10-CM

## 2013-10-19 DIAGNOSIS — I4891 Unspecified atrial fibrillation: Secondary | ICD-10-CM

## 2013-10-19 DIAGNOSIS — R0600 Dyspnea, unspecified: Secondary | ICD-10-CM

## 2013-10-19 DIAGNOSIS — R0989 Other specified symptoms and signs involving the circulatory and respiratory systems: Secondary | ICD-10-CM

## 2013-10-19 DIAGNOSIS — I4892 Unspecified atrial flutter: Secondary | ICD-10-CM

## 2013-10-19 NOTE — Progress Notes (Signed)
Patient ID: Heather [name] MRN: [id number], DOB/AGE: December 21, 1950   Date of Visit: 10/19/2013  Primary Physician: Leo Grosser, MD Primary Cardiologist: Shirlee Latch, MD Primary EP: Johney Frame, MD Reason for Visit: Shortness of breath  History of Present Illness  Heather [name] is a 64 y.o. female with AFib s/p AFib ablation x 2 by Dr. Sampson Goon at [facility] and atypical atrial flutter s/p RF ablation by Dr. Johney Frame in March 2014 who presents today for evaluation of SOB.     She reports SOB last week after waking up and walking to the bathroom. She developed "strange feeling" between her shoulder blades for a few minutes. She denies CP. She noticed "fluttering" palpitations about 15 minutes later. Throughout the day she had SOB and palpitations. She took an additional dose of diltiazem that evening. She states she woke up "feeling fine" the next morning. She denies any recurrent symptoms. Today she denies any complaints other than she feels metoprolol has caused her to gain weight. She denies chest pain or shortness of breath. She denies palpitations, dizziness, near syncope or syncope. She denies LE swelling, orthopnea or PND. She is compliant with medications.  Past Medical History Past Medical History  Diagnosis Date  . HTN (hypertension)   . Obesity   . Carpal tunnel syndrome   . Anemia     "onc;e; had to take iron for awhile"  . Degenerative disk disease   . History of blood transfusion     "w/both hip replacements"  . IBS (irritable bowel syndrome)   . Morton's neuroma     right foot  . Atrial fibrillation     Began in 2004.  Had ablations at [facility] in 2005 and 2007.  She is off coumadin  now with no noted recurrent atrial fibrillation since 2007. til 04/01/12; s/p initiation of Rhythmol with DCCV June 2013  . Hypothyroidism     no meds currently  . Sleep apnea 04/01/12    "dx'd just last week"  . Sleep apnea     uses CPAP  . Interstitial cystitis   . GERD (gastroesophageal  reflux disease)     resolved after lap band  . Shortness of breath     Past Surgical History Past Surgical History  Procedure Laterality Date  . Back surgery    . Carpal tunnel release  1990's    bilaterally  . Lasik    . Cardiac catheterization    . Laparoscopic gastric banding  2009  . Cardiac electrophysiology mapping and ablation  ~ 2005 and 2007    "did more 2nd time; both at [facility]"  . Tonsillectomy and adenoidectomy  1957  . Vaginal hysterectomy  2000  . Tubal ligation  1980  . Dilation and curettage of uterus  2000  . Total hip arthroplasty  ~2009; 2010    right; left  . Joint replacement      bilateral hips  . Posterior fusion lumbar spine  2000's    "nerve problems"  . Posterior fusion lumbar spine  2000's  . Cardioversion  04/02/2012    Procedure: CARDIOVERSION;  Surgeon: Laurey Morale, MD;  Location: [facility] OR;  Service: Cardiovascular;  Laterality: N/A;  . Cardioversion  06/16/2012    Procedure: CARDIOVERSION;  Surgeon: [name], MD;  Location: [facility] ENDOSCOPY;  Service: Cardiovascular;  Laterality: N/A;  amanda/ebp/Beverly( or scheduling)  . Anterior and posterior repair  10/20/2012    Procedure: ANTERIOR (CYSTOCELE) AND POSTERIOR REPAIR (RECTOCELE);  Surgeon: Crist Fat Rivard,  MD;  Location: [facility] ORS;  Service: Gynecology;  Laterality: N/A;  2 hours  . Cardioversion  10/29/2012    Procedure: CARDIOVERSION;  Surgeon: Laurey Moralealton S McLean, MD;  [facility];  Service: Cardiovascular;  Laterality: N/A;  . Tee without cardioversion N/A 12/29/2012    Procedure: TRANSESOPHAGEAL ECHOCARDIOGRAM (TEE);  Surgeon: Peter M SwazilandJordan, MD;  [facility];  Service: Cardiovascular;  Laterality: N/A;  . Cardioversion N/A 03/30/2013    Procedure: CARDIOVERSION;  Surgeon: Laurey Moralealton S McLean, MD;  [facility];  Service: Cardiovascular;  Laterality: N/A;    Allergies/Intolerances Allergies  Allergen Reactions  . Cephalexin Hives and Other (See Comments)     "throat started closing up"  . Rocephin [Ceftriaxone Sodium In Dextrose] Anaphylaxis  . Penicillins Rash  . Flecainide Acetate Other (See Comments)    "couldn't take it"  . Moxifloxacin     Does not remember the reaction  . Sulfonamide Derivatives     Does not remember reaction ; "was so young when I had reaction to it"    Current Home Medications Current Outpatient Prescriptions  Medication Sig Dispense Refill  . amitriptyline (ELAVIL) 10 MG tablet TAKE 1 TABLET BY MOUTH AT BEDTIME.  30 tablet  5  . Cholecalciferol (VITAMIN D3) 5000 UNITS TABS Take 1 tablet by mouth See admin instructions. Take 1 tablet by mouth daily on Monday, Tuesday, Wednesday, Thursday, Friday.      . diltiazem (CARDIZEM CD) 120 MG 24 hr capsule TAKE ONE CAPSULE EVERY DAY  30 capsule  0  . ELIQUIS 5 MG TABS tablet TAKE 1 TABLET BY MOUTH TWICE A DAY  60 tablet  3  . fexofenadine (ALLEGRA) 180 MG tablet Take 180 mg by mouth daily.      . furosemide (LASIX) 20 MG tablet Take 1 tablet (20 mg total) by mouth daily.  30 tablet  6  . guaiFENesin (MUCINEX) 600 MG 12 hr tablet Take 600 mg by mouth 2 (two) times daily as needed. For allergies.      . metoprolol succinate (TOPROL-XL) 50 MG 24 hr tablet 1 and 1/2 tablets (total 75mg ) daily. Take with or immediately following a meal.  45 tablet  6  . potassium chloride (K-DUR) 10 MEQ tablet Take 1 tablet (10 mEq total) by mouth daily.  30 tablet  6  . propafenone (RYTHMOL SR) 225 MG 12 hr capsule TAKE 1 CAPSULE (225 MG TOTAL) BY MOUTH EVERY 12 (TWELVE) HOURS.  60 capsule  0  . tolterodine (DETROL) 2 MG tablet Take by mouth.      . vitamin C (ASCORBIC ACID) 500 MG tablet Take 500 mg by mouth daily.      Marland Kitchen. lisinopril (PRINIVIL,ZESTRIL) 20 MG tablet TAKE 1 TABLET BY MOUTH EVERY DAY  30 tablet  0   No current facility-administered medications for this visit.    Social History History   Social History  . Marital Status: Married    Spouse Name: N/A    Number of Children: N/A    . Years of Education: N/A   Occupational History  . Not on file.   Social History Main Topics  . Smoking status: Never Smoker   . Smokeless tobacco: Never Used  . Alcohol Use: No  . Drug Use: No  . Sexual Activity: Yes     Comment: LAVH   Other Topics Concern  . Not on file   Social History Narrative   [place] [employer]   Married, lives  in [place]   Tobacco Use - No.    Alcohol Use - no     Review of Systems General: No chills, fever, night sweats or weight changes Cardiovascular: No chest pain, dyspnea on exertion, edema, orthopnea, palpitations, paroxysmal nocturnal dyspnea Dermatological: No rash, lesions or masses Respiratory: No cough, dyspnea Urologic: No hematuria, dysuria Abdominal: No nausea, vomiting, diarrhea, bright red blood per rectum, melena, or hematemesis Neurologic: No visual changes, weakness, changes in mental status All other systems reviewed and are otherwise negative except as noted above.  Physical Exam Vitals: Blood pressure 110/84, pulse 100, height 5' 5.5" (1.664 m), weight 328 lb (148.78 kg), SpO2 97.00%.  General: Well developed, well appearing 63 y.o. female in no acute distress. HEENT: Normocephalic, atraumatic. EOMs intact. Sclera nonicteric. Oropharynx clear.  Neck: Supple. No JVD. Lungs: Respirations regular and unlabored, CTA bilaterally. No wheezes, rales or rhonchi. Heart: Regular. S1, S2 present. No murmurs, rub, S3 or S4. Abdomen: Soft, non-distended.  Extremities: No clubbing, cyanosis or edema.  Psych: Normal affect. Neuro: Alert and oriented X 3. Moves all extremities spontaneously.   Diagnostics 12-lead ECG today - reviewed with Dr. Johney Frame - suspect atypical atrial flutter versus an atrial tach with first degree AV block, rate 97 bpm Cardiac cath by Dr. Elsie Lincoln 2004 - normal coronary arteries [facility] June 2013 - negative for ischemia  Assessment and Plan 1. Probable atypical atrial flutter -  difficult to know if related to episode of SOB last week (question if she is in flutter chronically and if rate was elevated at the time precipitating SOB, or if she is going in and out of flutter and her symptoms were precipitated by the onset of flutter) - no recurrent symptoms so far but instructed her to report any recurrence and will consider 48-hr Holter to correlate symptoms to heart rate and rhythm - continue CCB and BB for rate control - continue Eliquis for stroke prevention - return for follow-up with Dr. Johney Frame in 4-6 weeks  This plan of care was formulated with Dr. Johney Frame today Signed, Rick Duff, PA-C 10/19/2013, 4:47 PM

## 2013-10-28 ENCOUNTER — Telehealth: Payer: Self-pay | Admitting: Family Medicine

## 2013-10-28 MED ORDER — LOSARTAN POTASSIUM 100 MG PO TABS: 100.0000 mg | ORAL_TABLET | Freq: Every day | ORAL | Status: DC

## 2013-10-28 NOTE — Telephone Encounter (Signed)
Patient aware and med sent to pharm 

## 2013-10-28 NOTE — Telephone Encounter (Signed)
Pt is calling back stating that Dr Tanya NonesPickard had gave her some samples and that she is suppose to call back and let him know how it is doing Doing fine with the new BP  Medication   Denicar (working fine, cough and throat whistling is gone) Call back number [phone] Pharmacy is [facility] on [address]

## 2013-10-28 NOTE — Telephone Encounter (Signed)
Discontinue benicar due to cost and replace with losartan 100 mg poqday.  Stay off lisinopril.

## 2013-11-02 ENCOUNTER — Other Ambulatory Visit: Payer: Self-pay | Admitting: Nurse Practitioner

## 2013-11-09 ENCOUNTER — Other Ambulatory Visit: Payer: Self-pay | Admitting: Internal Medicine

## 2013-11-17 ENCOUNTER — Telehealth: Payer: Self-pay | Admitting: Internal Medicine

## 2013-11-17 ENCOUNTER — Encounter: Payer: Self-pay | Admitting: Internal Medicine

## 2013-11-17 ENCOUNTER — Ambulatory Visit (INDEPENDENT_AMBULATORY_CARE_PROVIDER_SITE_OTHER): Payer: BC Managed Care – PPO | Admitting: Internal Medicine

## 2013-11-17 VITALS — BP 122/78 | HR 97 | Ht 65.5 in | Wt 327.0 lb

## 2013-11-17 DIAGNOSIS — I4892 Unspecified atrial flutter: Secondary | ICD-10-CM

## 2013-11-17 DIAGNOSIS — I484 Atypical atrial flutter: Secondary | ICD-10-CM

## 2013-11-17 DIAGNOSIS — I4891 Unspecified atrial fibrillation: Secondary | ICD-10-CM

## 2013-11-17 LAB — HEPATIC FUNCTION PANEL
ALT: 16 U/L (ref 0–35)
AST: 15 U/L (ref 0–37)
Albumin: 3.9 g/dL (ref 3.5–5.2)
Alkaline Phosphatase: 106 U/L (ref 39–117)
Bilirubin, Direct: 0.1 mg/dL (ref 0.0–0.3)
Total Bilirubin: 0.8 mg/dL (ref 0.3–1.2)
Total Protein: 6.8 g/dL (ref 6.0–8.3)

## 2013-11-17 LAB — T4, FREE: FREE T4: 0.97 ng/dL (ref 0.60–1.60)

## 2013-11-17 LAB — TSH: TSH: 3.14 u[IU]/mL (ref 0.35–5.50)

## 2013-11-17 MED ORDER — METOPROLOL SUCCINATE ER 50 MG PO TB24: 50.0000 mg | ORAL_TABLET | Freq: Every day | ORAL | Status: DC

## 2013-11-17 MED ORDER — AMIODARONE HCL 200 MG PO TABS: ORAL_TABLET | ORAL | Status: DC

## 2013-11-17 NOTE — Patient Instructions (Signed)
Your physician recommends that you schedule a follow-up appointment in: 6 weeks with Dr Johney FrameAllred  Your physician has recommended you make the following change in your medication:  1) stop Propafenone 2) Sunday Start Amiodarone 200mg  take 2 tablets twice daily for 1 week, then 200mg  twice daily for two weeks, then 200mg  daily 3) Decrease Metoprolol to 50mg  daily  Your physician recommends that you return for lab work today: LFT/tsh/ free t4/   Your physician has recommended that you have a pulmonary function test. Pulmonary Function Tests are a group of tests that measure how well air moves in and out of your lungs.

## 2013-11-17 NOTE — Telephone Encounter (Signed)
Let pharmacy know the Propafenone was stopped

## 2013-11-17 NOTE — Telephone Encounter (Signed)
New message     Question about a prescription that was written today

## 2013-11-20 ENCOUNTER — Other Ambulatory Visit: Payer: Self-pay | Admitting: Internal Medicine

## 2013-11-20 ENCOUNTER — Other Ambulatory Visit: Payer: Self-pay | Admitting: Family Medicine

## 2013-11-20 ENCOUNTER — Other Ambulatory Visit: Payer: Self-pay | Admitting: Cardiology

## 2013-11-21 ENCOUNTER — Other Ambulatory Visit: Payer: Self-pay | Admitting: Internal Medicine

## 2013-11-21 ENCOUNTER — Other Ambulatory Visit: Payer: Self-pay | Admitting: Cardiology

## 2013-11-27 DIAGNOSIS — I484 Atypical atrial flutter: Secondary | ICD-10-CM | POA: Insufficient documentation

## 2013-11-27 NOTE — Progress Notes (Signed)
Primary Care Physician: Leo GrosserPICKARD,[name], MD Referring Physician:  Dr Boykin NearingKlein   Heather [name] is a 63 y.o. female with a h/o atrial fibrillation s/p afib ablation x 2 by Dr Sampson [facility]oonFitzgerald [facility] who presents for EP follow-up.  SInce her recent cardioversion, she has returned to an atypical atrial flutter.  She reports decreased exercise tolerance, palpitations, and fatigue.   Today, she denies symptoms of  chest pain, shortness of breath, orthopnea, PND, lower extremity edema, dizziness, presyncope, syncope, or neurologic sequela. The patient is tolerating medications without difficulties and is otherwise without complaint today.   Past Medical History  Diagnosis Date  . HTN (hypertension)   . Obesity   . Carpal tunnel syndrome   . Anemia     "onc;e; had to take iron for awhile"  . Degenerative disk disease   . History of blood transfusion     "w/both hip replacements"  . IBS (irritable bowel syndrome)   . Morton's neuroma     right foot  . Atrial fibrillation     Began in 2004.  [facility] in [facility] and 2007.  She is off coumadin  now with no noted recurrent atrial fibrillation since 2007. til 04/01/12; s/p initiation of Rhythmol with DCCV June 2013  . Hypothyroidism     no meds currently  . Sleep apnea 04/01/12    "dx'd just last week"  . Sleep apnea     uses CPAP  . Interstitial cystitis   . GERD (gastroesophageal reflux disease)     resolved after lap band  . Shortness of breath    Past Surgical History  Procedure Laterality Date  . Back surgery    . Carpal tunnel release  1990's    bilaterally  . Lasik    . Cardiac catheterization    . Laparoscopic gastric banding  2009  . Cardiac electrophysiology mapping and ablation  ~ 2005 and 2007    "[facility]nd time; [facility]"  . Tonsillectomy and adenoidectomy  1957  . Vaginal hysterectomy  2000  . Tubal ligation  1980  . Dilation and curettage of uterus  2000  . Total hip  arthroplasty  ~2009; 2010    right; left  . Joint replacement      bilateral hips  . Posterior fusion lumbar spine  2000's    "nerve problems"  . Posterior fusion lumbar spine  2000's  . Cardioversion  04/02/2012    Procedure: CARDIOVERSION;  Surgeon: Laurey Moralealton S McLean, MD;  [facility];  Service: Cardiovascular;  Laterality: N/A;  . Cardioversion  06/16/2012    Procedure: CARDIOVERSION;  Surgeon: Vesta MixerPhilip J Nahser, MD;  [facility];  Service: Cardiovascular;  Laterality: N/A;  amanda/ebp/Beverly( or scheduling)  . Anterior and posterior repair  10/20/2012    Procedure: ANTERIOR (CYSTOCELE) AND POSTERIOR REPAIR (RECTOCELE);  Surgeon: Esmeralda ArthurSandra A Rivard, MD;  Location: [facility] ORS;  Service: Gynecology;  Laterality: N/A;  2 hours  . Cardioversion  10/29/2012    Procedure: CARDIOVERSION;  Surgeon: Laurey Moralealton S McLean, MD;  [facility];  Service: Cardiovascular;  Laterality: N/A;  . Tee without cardioversion N/A 12/29/2012    Procedure: TRANSESOPHAGEAL ECHOCARDIOGRAM (TEE);  Surgeon: Peter M SwazilandJordan, MD;  [facility];  Service: Cardiovascular;  Laterality: N/A;  . Cardioversion N/A 03/30/2013    Procedure: CARDIOVERSION;  Surgeon: Laurey Moralealton S McLean, MD;  [facility];  Service: Cardiovascular;  Laterality: N/A;    Current Outpatient Prescriptions  Medication Sig Dispense Refill  . Cholecalciferol (VITAMIN D3) 5000 UNITS TABS Take 1 tablet by mouth See admin instructions. Take 1 tablet by mouth daily on Monday, Tuesday, Wednesday, Thursday, Friday.      . fexofenadine (ALLEGRA) 180 MG tablet Take 180 mg by mouth daily.      . furosemide (LASIX) 20 MG tablet Take 1 tablet (20 mg total) by mouth daily.  30 tablet  6  . guaiFENesin (MUCINEX) 600 MG 12 hr tablet Take 600 mg by mouth 2 (two) times daily as needed. For allergies.      Marland Kitchen losartan (COZAAR) 100 MG tablet Take 1 tablet (100 mg total) by mouth daily.  30 tablet  5  . metoprolol succinate (TOPROL-XL) 50 MG 24 hr  tablet Take 1 tablet (50 mg total) by mouth daily. 1 and 1/2 tablets (total 75mg ) daily. Take with or immediately following a meal.  90 tablet  3  . potassium chloride (K-DUR) 10 MEQ tablet Take 1 tablet (10 mEq total) by mouth daily.  30 tablet  6  . tolterodine (DETROL) 2 MG tablet Take by mouth.      . vitamin C (ASCORBIC ACID) 500 MG tablet Take 500 mg by mouth daily.      Marland Kitchen amiodarone (PACERONE) 200 MG tablet Take 400mg  twice daily for 1 wk then 200mg  twice daily for 2 weeks then 200mg  daily  90 tablet  3  . amitriptyline (ELAVIL) 10 MG tablet TAKE 1 TABLET BY MOUTH AT BEDTIME.  30 tablet  5  . diltiazem (CARDIZEM CD) 120 MG 24 hr capsule TAKE ONE CAPSULE EVERY DAY  30 capsule  3  . ELIQUIS 5 MG TABS tablet TAKE 1 TABLET BY MOUTH TWICE A DAY  60 tablet  3  . lisinopril (PRINIVIL,ZESTRIL) 20 MG tablet TAKE 1 TABLET BY MOUTH EVERY DAY  30 tablet  3   No current facility-administered medications for this visit.    Allergies  Allergen Reactions  . Cephalexin Hives and Other (See Comments)    "throat started closing up"  . Rocephin [Ceftriaxone Sodium In Dextrose] Anaphylaxis  . Penicillins Rash  . Flecainide Acetate Other (See Comments)    "couldn't take it"  . Moxifloxacin     Does not remember the reaction  . Sulfonamide Derivatives     Does not remember reaction ; "was so young when I had reaction to it"    History   Social History  . Marital Status: Married    Spouse Name: N/A    Number of Children: N/A  . Years of Education: N/A   Occupational History  . Not on file.   Social History Main Topics  . Smoking status: Never Smoker   . Smokeless tobacco: Never Used  . Alcohol Use: No  . Drug Use: No  . Sexual Activity: Yes     Comment: LAVH   Other Topics Concern  . Not on file   Social History Narrative   Works in data entry in [employer]   Married, lives in [place]   Tobacco Use - No.    Alcohol Use - no    Family History  Problem Relation Age of  Onset  . Heart attack      granfather  . Hypertension Father   . Diabetes Father   . Dementia Father   . Atrial fibrillation Mother   . Diabetes Sister   . Diabetes Brother   . Heart disease Maternal Grandmother  ROS- All systems are reviewed and negative except as per the HPI above  Physical Exam: Filed Vitals:   11/17/13 1151  BP: 122/78  Pulse: 97  Height: 5' 5.5" (1.664 m)  Weight: 327 lb (148.326 kg)    GEN- The patient is obese appearing, alert and oriented x 3 today.   Head- normocephalic, atraumatic Eyes-  Sclera clear, conjunctiva pink Ears- hearing intact Oropharynx- clear Neck- supple,   Lungs- Clear to ausculation bilaterally, normal work of breathing Heart- irregular rate and rhythm, no murmurs, rubs or gallops, PMI not laterally displaced GI- soft, NT, ND, + BS Extremities- no clubbing, cyanosis, or edema MS- no significant deformity or atrophy Skin- no rash or lesion Psych- euthymic mood, full affect Neuro- strength and sensation are intact  EKG today reveals atypical atrial flutter vs atrial tachycardia.   Assessment and Plan:  1. Atypical atrial flutter/ atrial fib The patient returns today in atypical atrial flutter. Therapeutic strategies for atrial arrhythmias including medicine and ablation were discussed in detail with the patient today. Risk, benefits, and alternatives to EP study and radiofrequency ablation for afib were also discussed in detail today. She is clear that she would like to avoid ablation.  I discussed tikosyn and amiodarone as alternatives.  She reports that she has taken amiodarone in the past and would prefer to return to this medicine as she did so well on it.  Risks, benefits, and alternatives to amiodarone were discussed at length.  She understands risks of pulmonary/thyroid/liver toxicity with the rare change of death also and wishes to proceed. I will therefore stop propafenone today and start amiodarone. LFTs/TFTs/ PFTS  are ordered She will return in 6 weeks for followup.  IF she remains in atrial flutter then she may require cardioversion at that time. With addition of amiodarone I will decrease metoprolol at this time.

## 2013-12-16 ENCOUNTER — Ambulatory Visit (INDEPENDENT_AMBULATORY_CARE_PROVIDER_SITE_OTHER): Payer: BC Managed Care – PPO | Admitting: Internal Medicine

## 2013-12-16 DIAGNOSIS — I4892 Unspecified atrial flutter: Secondary | ICD-10-CM

## 2013-12-16 LAB — PULMONARY FUNCTION TEST
DL/VA % pred: 108 %
DL/VA: 5.26 ml/min/mmHg/L
DLCO UNC % PRED: 74 %
DLCO UNC: 18.47 ml/min/mmHg
FEF 25-75 POST: 2.1 L/s
FEF 25-75 PRE: 1.47 L/s
FEF2575-%Change-Post: 42 %
FEF2575-%Pred-Post: 90 %
FEF2575-%Pred-Pre: 63 %
FEV1-%Change-Post: 8 %
FEV1-%Pred-Post: 72 %
FEV1-%Pred-Pre: 66 %
FEV1-Post: 1.84 L
FEV1-Pre: 1.7 L
FEV1FVC-%Change-Post: 5 %
FEV1FVC-%Pred-Pre: 101 %
FEV6-%CHANGE-POST: 2 %
FEV6-%PRED-PRE: 67 %
FEV6-%Pred-Post: 68 %
FEV6-POST: 2.21 L
FEV6-Pre: 2.16 L
FEV6FVC-%PRED-POST: 104 %
FEV6FVC-%Pred-Pre: 104 %
FVC-%CHANGE-POST: 2 %
FVC-%PRED-POST: 66 %
FVC-%PRED-PRE: 64 %
FVC-POST: 2.21 L
FVC-PRE: 2.16 L
POST FEV6/FVC RATIO: 100 %
Post FEV1/FVC ratio: 84 %
Pre FEV1/FVC ratio: 79 %
Pre FEV6/FVC Ratio: 100 %
RV % pred: 74 %
RV: 1.53 L
TLC % pred: 73 %
TLC: 3.77 L

## 2013-12-16 NOTE — Progress Notes (Signed)
PFT done today. 

## 2013-12-20 ENCOUNTER — Other Ambulatory Visit: Payer: Self-pay | Admitting: Physician Assistant

## 2013-12-20 DIAGNOSIS — M25572 Pain in left ankle and joints of left foot: Secondary | ICD-10-CM

## 2013-12-22 ENCOUNTER — Encounter: Payer: Self-pay | Admitting: Internal Medicine

## 2013-12-22 ENCOUNTER — Ambulatory Visit (INDEPENDENT_AMBULATORY_CARE_PROVIDER_SITE_OTHER): Payer: BC Managed Care – PPO | Admitting: Internal Medicine

## 2013-12-22 ENCOUNTER — Encounter: Payer: Self-pay | Admitting: *Deleted

## 2013-12-22 VITALS — BP 128/82 | HR 101 | Ht 65.5 in | Wt 329.8 lb

## 2013-12-22 DIAGNOSIS — I10 Essential (primary) hypertension: Secondary | ICD-10-CM

## 2013-12-22 DIAGNOSIS — I4892 Unspecified atrial flutter: Secondary | ICD-10-CM

## 2013-12-22 DIAGNOSIS — I4891 Unspecified atrial fibrillation: Secondary | ICD-10-CM

## 2013-12-22 DIAGNOSIS — Z7901 Long term (current) use of anticoagulants: Secondary | ICD-10-CM

## 2013-12-22 DIAGNOSIS — I484 Atypical atrial flutter: Secondary | ICD-10-CM

## 2013-12-22 LAB — HEPATIC FUNCTION PANEL
ALBUMIN: 4.2 g/dL (ref 3.5–5.2)
ALK PHOS: 94 U/L (ref 39–117)
ALT: 18 U/L (ref 0–35)
AST: 19 U/L (ref 0–37)
Bilirubin, Direct: 0 mg/dL (ref 0.0–0.3)
Total Bilirubin: 0.6 mg/dL (ref 0.3–1.2)
Total Protein: 7.4 g/dL (ref 6.0–8.3)

## 2013-12-22 LAB — CBC WITH DIFFERENTIAL/PLATELET
BASOS PCT: 1 % (ref 0.0–3.0)
Basophils Absolute: 0.1 10*3/uL (ref 0.0–0.1)
EOS PCT: 6.2 % — AB (ref 0.0–5.0)
Eosinophils Absolute: 0.3 10*3/uL (ref 0.0–0.7)
HCT: 39 % (ref 36.0–46.0)
HEMOGLOBIN: 12.8 g/dL (ref 12.0–15.0)
Lymphocytes Relative: 20 % (ref 12.0–46.0)
Lymphs Abs: 1.1 10*3/uL (ref 0.7–4.0)
MCHC: 32.8 g/dL (ref 30.0–36.0)
MCV: 85.3 fl (ref 78.0–100.0)
MONOS PCT: 7.9 % (ref 3.0–12.0)
Monocytes Absolute: 0.4 10*3/uL (ref 0.1–1.0)
NEUTROS ABS: 3.6 10*3/uL (ref 1.4–7.7)
Neutrophils Relative %: 64.9 % (ref 43.0–77.0)
Platelets: 280 10*3/uL (ref 150.0–400.0)
RBC: 4.57 Mil/uL (ref 3.87–5.11)
RDW: 15.8 % — ABNORMAL HIGH (ref 11.5–14.6)
WBC: 5.5 10*3/uL (ref 4.5–10.5)

## 2013-12-22 LAB — BASIC METABOLIC PANEL
BUN: 27 mg/dL — ABNORMAL HIGH (ref 6–23)
CO2: 29 mEq/L (ref 19–32)
Calcium: 9.7 mg/dL (ref 8.4–10.5)
Chloride: 105 mEq/L (ref 96–112)
Creatinine, Ser: 1 mg/dL (ref 0.4–1.2)
GFR: 61.23 mL/min (ref >60.00)
Glucose, Bld: 92 mg/dL (ref 70–99)
Potassium: 5 mEq/L (ref 3.5–5.1)
SODIUM: 140 meq/L (ref 135–145)

## 2013-12-22 LAB — TSH: TSH: 2.37 u[IU]/mL (ref 0.35–5.50)

## 2013-12-22 LAB — T4, FREE: Free T4: 0.82 ng/dL (ref 0.60–1.60)

## 2013-12-22 MED ORDER — SODIUM CHLORIDE 0.9 % IV SOLN: 250.0000 mL | INTRAVENOUS | Status: DC

## 2013-12-22 MED ORDER — SODIUM CHLORIDE 0.9 % IJ SOLN: 3.0000 mL | Freq: Two times a day (BID) | INTRAMUSCULAR | Status: DC

## 2013-12-22 MED ORDER — SODIUM CHLORIDE 0.9 % IJ SOLN: 3.0000 mL | INTRAMUSCULAR | Status: DC | PRN

## 2013-12-22 NOTE — Patient Instructions (Addendum)
Your physician has recommended that you have a Cardioversion (DCCV). Electrical Cardioversion uses a jolt of electricity to your heart either through paddles or wired patches attached to your chest. This is a controlled, usually prescheduled, procedure. Defibrillation is done under light anesthesia in the hospital, and you usually go home the day of the procedure. This is done to get your heart back into a normal rhythm. You are not awake for the procedure. Please see the instruction sheet given to you today.    Your physician recommends that you schedule a follow-up appointment in: 6 weeks with Dr Johney FrameAllred  Your physician recommends that you return for lab work today

## 2013-12-22 NOTE — Progress Notes (Signed)
PCP:  Leo Grosser, MD  The patient presents today for routine electrophysiology followup.  Since last being seen in our clinic, the patient reports doing very well.  She feels much better off of propafenone.Today, she denies symptoms of palpitations, chest pain, shortness of breath, orthopnea, PND, lower extremity edema, dizziness, presyncope, syncope, or neurologic sequela.  The patient feels that she is tolerating medications without difficulties and is otherwise without complaint today.   Past Medical History  Diagnosis Date  . HTN (hypertension)   . Obesity   . Carpal tunnel syndrome   . Anemia     "onc;e; had to take iron for awhile"  . Degenerative disk disease   . History of blood transfusion     "w/both hip replacements"  . IBS (irritable bowel syndrome)   . Morton's neuroma     right foot  . Atrial fibrillation     Began in 2004.  Had ablations at [facility] in 2005 and 2007.  She is off coumadin  now with no noted recurrent atrial fibrillation since 2007. til 04/01/12; s/p initiation of Rhythmol with DCCV June 2013  . Hypothyroidism     no meds currently  . Sleep apnea 04/01/12    "dx'd just last week"  . Sleep apnea     uses CPAP  . Interstitial cystitis   . GERD (gastroesophageal reflux disease)     resolved after lap band  . Shortness of breath    Past Surgical History  Procedure Laterality Date  . Back surgery    . Carpal tunnel release  1990's    bilaterally  . Lasik    . Cardiac catheterization    . Laparoscopic gastric banding  2009  . Cardiac electrophysiology mapping and ablation  ~ 2005 and 2007    "did more 2nd time; both at [facility]"  . Tonsillectomy and adenoidectomy  1957  . Vaginal hysterectomy  2000  . Tubal ligation  1980  . Dilation and curettage of uterus  2000  . Total hip arthroplasty  ~2009; 2010    right; left  . Joint replacement      bilateral hips  . Posterior fusion lumbar spine  2000's    "nerve problems"  .  Posterior fusion lumbar spine  2000's  . Cardioversion  04/02/2012    Procedure: CARDIOVERSION;  Surgeon: Laurey Morale, MD;  Location: [facility] OR;  Service: Cardiovascular;  Laterality: N/A;  . Cardioversion  06/16/2012    Procedure: CARDIOVERSION;  Surgeon: [name], MD;  Location: [facility] ENDOSCOPY;  Service: Cardiovascular;  Laterality: N/A;  amanda/ebp/Beverly( or scheduling)  . Anterior and posterior repair  10/20/2012    Procedure: ANTERIOR (CYSTOCELE) AND POSTERIOR REPAIR (RECTOCELE);  Surgeon: Esmeralda Arthur, MD;  Location: [facility] ORS;  Service: Gynecology;  Laterality: N/A;  2 hours  . Cardioversion  10/29/2012    Procedure: CARDIOVERSION;  Surgeon: Laurey Morale, MD;  Location: [facility] ENDOSCOPY;  Service: Cardiovascular;  Laterality: N/A;  . Tee without cardioversion N/A 12/29/2012    Procedure: TRANSESOPHAGEAL ECHOCARDIOGRAM (TEE);  Surgeon: Peter M Swaziland, MD;  Location: [facility] ENDOSCOPY;  Service: Cardiovascular;  Laterality: N/A;  . Cardioversion N/A 03/30/2013    Procedure: CARDIOVERSION;  Surgeon: Laurey Morale, MD;  Location: [facility] ENDOSCOPY;  Service: Cardiovascular;  Laterality: N/A;    Current Outpatient Prescriptions  Medication Sig Dispense Refill  . amiodarone (PACERONE) 200 MG tablet Take 400mg  twice daily for 1 wk then 200mg  twice daily for 2 weeks then  200mg  daily  90 tablet  3  . amitriptyline (ELAVIL) 10 MG tablet TAKE 1 TABLET BY MOUTH AT BEDTIME.  30 tablet  5  . Cholecalciferol (VITAMIN D3) 5000 UNITS TABS Take 1 tablet by mouth See admin instructions. Take 1 tablet by mouth daily on Monday, Tuesday, Wednesday, Thursday, Friday.      . diltiazem (CARDIZEM CD) 120 MG 24 hr capsule TAKE ONE CAPSULE EVERY DAY  30 capsule  3  . ELIQUIS 5 MG TABS tablet TAKE 1 TABLET BY MOUTH TWICE A DAY  60 tablet  3  . fexofenadine (ALLEGRA) 180 MG tablet Take 180 mg by mouth daily.      . furosemide (LASIX) 20 MG tablet Take 1 tablet (20 mg total) by mouth daily.  30 tablet  6  . guaiFENesin  (MUCINEX) 600 MG 12 hr tablet Take 600 mg by mouth 2 (two) times daily as needed. For allergies.      Marland Kitchen lisinopril (PRINIVIL,ZESTRIL) 20 MG tablet TAKE 1 TABLET BY MOUTH EVERY DAY  30 tablet  3  . losartan (COZAAR) 100 MG tablet Take 1 tablet (100 mg total) by mouth daily.  30 tablet  5  . metoprolol succinate (TOPROL-XL) 50 MG 24 hr tablet Take 1 tablet (50 mg total) by mouth daily. 1 and 1/2 tablets (total 75mg ) daily. Take with or immediately following a meal.  90 tablet  3  . potassium chloride (K-DUR) 10 MEQ tablet Take 1 tablet (10 mEq total) by mouth daily.  30 tablet  6  . tolterodine (DETROL) 2 MG tablet Take by mouth.      . vitamin C (ASCORBIC ACID) 500 MG tablet Take 500 mg by mouth daily.       No current facility-administered medications for this visit.    Allergies  Allergen Reactions  . Cephalexin Hives and Other (See Comments)    "throat started closing up"  . Rocephin [Ceftriaxone Sodium In Dextrose] Anaphylaxis  . Penicillins Rash  . Flecainide Acetate Other (See Comments)    "couldn't take it"  . Moxifloxacin     Does not remember the reaction  . Sulfonamide Derivatives     Does not remember reaction ; "was so young when I had reaction to it"    History   Social History  . Marital Status: Married    Spouse Name: N/A    Number of Children: N/A  . Years of Education: N/A   Occupational History  . Not on file.   Social History Main Topics  . Smoking status: Never Smoker   . Smokeless tobacco: Never Used  . Alcohol Use: No  . Drug Use: No  . Sexual Activity: Yes     Comment: LAVH   Other Topics Concern  . 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    Alcohol Use - no    Family History  Problem Relation Age of Onset  . Heart attack      granfather  . Hypertension Father   . Diabetes Father   . Dementia Father   . Atrial fibrillation Mother   . Diabetes  Sister   . Diabetes Brother   . Heart disease Maternal Grandmother     ROS-  All systems are reviewed and are negative except as outlined in the HPI above  Physical Exam: There were no vitals filed for this  visit.  GEN- The patient is overweight appearing, alert and oriented x 3 today.   Head- normocephalic, atraumatic Eyes-  Sclera clear, conjunctiva pink Ears- hearing intact Oropharynx- clear Neck- supple, no JVP Lymph- no cervical lymphadenopathy Lungs- Clear to ausculation bilaterally, normal work of breathing Heart- irregular rate and rhythm, no murmurs, rubs or gallops, PMI not laterally displaced GI- soft, NT, ND, + BS Extremities- no clubbing, cyanosis, or edema MS- no significant deformity or atrophy Skin- no rash or lesion Psych- euthymic mood, full affect Neuro- strength and sensation are intact  Assessment and Plan:  Atypical atrial flutter She feels better off of propafenone and on amiodarone but remains in atrial flutter. She wishes to avoid ablation.  I have therefore advised cardioversion.  Risks, benefits, and alternatives to this procedure were discussed with the patient who wishes to proceed. She has been adequately anticoagulated with eliquis.  htn Stable No change required today  Obesity Weight loss is advised  Return to see me in 6 weeks

## 2013-12-23 ENCOUNTER — Other Ambulatory Visit: Payer: Self-pay

## 2013-12-23 ENCOUNTER — Ambulatory Visit
Admission: RE | Admit: 2013-12-23 | Discharge: 2013-12-23 | Disposition: A | Discharge status: Home / Self Care | Payer: BC Managed Care – PPO | Source: Ambulatory Visit | Attending: Physician Assistant | Admitting: Physician Assistant

## 2013-12-23 ENCOUNTER — Encounter (HOSPITAL_COMMUNITY): Payer: Self-pay

## 2013-12-23 ENCOUNTER — Ambulatory Visit (HOSPITAL_COMMUNITY)
Admission: RE | Admit: 2013-12-23 | Discharge: 2013-12-23 | Disposition: A | Discharge status: Home / Self Care | Payer: BC Managed Care – PPO | Source: Ambulatory Visit | Attending: Cardiology | Admitting: Cardiology

## 2013-12-23 ENCOUNTER — Encounter (HOSPITAL_COMMUNITY): Payer: BC Managed Care – PPO | Admitting: Anesthesiology

## 2013-12-23 ENCOUNTER — Ambulatory Visit (HOSPITAL_COMMUNITY): Payer: BC Managed Care – PPO | Admitting: Anesthesiology

## 2013-12-23 ENCOUNTER — Encounter (HOSPITAL_COMMUNITY)
Admission: RE | Disposition: A | Discharge status: Home / Self Care | Payer: Self-pay | Source: Ambulatory Visit | Attending: Cardiology

## 2013-12-23 DIAGNOSIS — E669 Obesity, unspecified: Secondary | ICD-10-CM | POA: Insufficient documentation

## 2013-12-23 DIAGNOSIS — M25572 Pain in left ankle and joints of left foot: Secondary | ICD-10-CM

## 2013-12-23 DIAGNOSIS — I10 Essential (primary) hypertension: Secondary | ICD-10-CM | POA: Insufficient documentation

## 2013-12-23 DIAGNOSIS — I4892 Unspecified atrial flutter: Secondary | ICD-10-CM

## 2013-12-23 DIAGNOSIS — E039 Hypothyroidism, unspecified: Secondary | ICD-10-CM | POA: Insufficient documentation

## 2013-12-23 DIAGNOSIS — K219 Gastro-esophageal reflux disease without esophagitis: Secondary | ICD-10-CM | POA: Insufficient documentation

## 2013-12-23 DIAGNOSIS — G4730 Sleep apnea, unspecified: Secondary | ICD-10-CM | POA: Insufficient documentation

## 2013-12-23 HISTORY — PX: CARDIOVERSION: SHX1299

## 2013-12-23 SURGERY — CARDIOVERSION
Anesthesia: Monitor Anesthesia Care

## 2013-12-23 MED ORDER — SODIUM CHLORIDE 0.9 % IV SOLN
INTRAVENOUS | Status: DC | PRN
  Administered 2013-12-23: 09:00:00 via INTRAVENOUS

## 2013-12-23 MED ORDER — PROPOFOL 10 MG/ML IV BOLUS
INTRAVENOUS | Status: DC | PRN
  Administered 2013-12-23: 80 mg via INTRAVENOUS

## 2013-12-23 NOTE — H&P (View-Only) (Signed)
PCP:  Leo Grosser, MD  The patient presents today for routine electrophysiology followup.  Since last being seen in our clinic, the patient reports doing very well.  She feels much better off of propafenone.Today, she denies symptoms of palpitations, chest pain, shortness of breath, orthopnea, PND, lower extremity edema, dizziness, presyncope, syncope, or neurologic sequela.  The patient feels that she is tolerating medications without difficulties and is otherwise without complaint today.   Past Medical History  Diagnosis Date  . HTN (hypertension)   . Obesity   . Carpal tunnel syndrome   . Anemia     "onc;e; had to take iron for awhile"  . Degenerative disk disease   . History of blood transfusion     "w/both hip replacements"  . IBS (irritable bowel syndrome)   . Morton's neuroma     right foot  . Atrial fibrillation     Began in 2004.  Had ablations at [facility] in 2005 and 2007.  She is off coumadin  now with no noted recurrent atrial fibrillation since 2007. til 04/01/12; s/p initiation of Rhythmol with DCCV June 2013  . Hypothyroidism     no meds currently  . Sleep apnea 04/01/12    "dx'd just last week"  . Sleep apnea     uses CPAP  . Interstitial cystitis   . GERD (gastroesophageal reflux disease)     resolved after lap band  . Shortness of breath    Past Surgical History  Procedure Laterality Date  . Back surgery    . Carpal tunnel release  1990's    bilaterally  . Lasik    . Cardiac catheterization    . Laparoscopic gastric banding  2009  . Cardiac electrophysiology mapping and ablation  ~ 2005 and 2007    "did more 2nd time; both at [facility]"  . Tonsillectomy and adenoidectomy  1957  . Vaginal hysterectomy  2000  . Tubal ligation  1980  . Dilation and curettage of uterus  2000  . Total hip arthroplasty  ~2009; 2010    right; left  . Joint replacement      bilateral hips  . Posterior fusion lumbar spine  2000's    "nerve problems"  .  Posterior fusion lumbar spine  2000's  . Cardioversion  04/02/2012    Procedure: CARDIOVERSION;  Surgeon: Laurey Morale, MD;  Location: [facility] OR;  Service: Cardiovascular;  Laterality: N/A;  . Cardioversion  06/16/2012    Procedure: CARDIOVERSION;  Surgeon: [name], MD;  Location: [facility] ENDOSCOPY;  Service: Cardiovascular;  Laterality: N/A;  amanda/ebp/Beverly( or scheduling)  . Anterior and posterior repair  10/20/2012    Procedure: ANTERIOR (CYSTOCELE) AND POSTERIOR REPAIR (RECTOCELE);  Surgeon: Esmeralda Arthur, MD;  Location: [facility] ORS;  Service: Gynecology;  Laterality: N/A;  2 hours  . Cardioversion  10/29/2012    Procedure: CARDIOVERSION;  Surgeon: Laurey Morale, MD;  Location: [facility] ENDOSCOPY;  Service: Cardiovascular;  Laterality: N/A;  . Tee without cardioversion N/A 12/29/2012    Procedure: TRANSESOPHAGEAL ECHOCARDIOGRAM (TEE);  Surgeon: Peter M Swaziland, MD;  Location: [facility] ENDOSCOPY;  Service: Cardiovascular;  Laterality: N/A;  . Cardioversion N/A 03/30/2013    Procedure: CARDIOVERSION;  Surgeon: Laurey Morale, MD;  Location: [facility] ENDOSCOPY;  Service: Cardiovascular;  Laterality: N/A;    Current Outpatient Prescriptions  Medication Sig Dispense Refill  . amiodarone (PACERONE) 200 MG tablet Take 400mg  twice daily for 1 wk then 200mg  twice daily for 2 weeks then  200mg  daily  90 tablet  3  . amitriptyline (ELAVIL) 10 MG tablet TAKE 1 TABLET BY MOUTH AT BEDTIME.  30 tablet  5  . Cholecalciferol (VITAMIN D3) 5000 UNITS TABS Take 1 tablet by mouth See admin instructions. Take 1 tablet by mouth daily on Monday, Tuesday, Wednesday, Thursday, Friday.      . diltiazem (CARDIZEM CD) 120 MG 24 hr capsule TAKE ONE CAPSULE EVERY DAY  30 capsule  3  . ELIQUIS 5 MG TABS tablet TAKE 1 TABLET BY MOUTH TWICE A DAY  60 tablet  3  . fexofenadine (ALLEGRA) 180 MG tablet Take 180 mg by mouth daily.      . furosemide (LASIX) 20 MG tablet Take 1 tablet (20 mg total) by mouth daily.  30 tablet  6  . guaiFENesin  (MUCINEX) 600 MG 12 hr tablet Take 600 mg by mouth 2 (two) times daily as needed. For allergies.      Marland Kitchen lisinopril (PRINIVIL,ZESTRIL) 20 MG tablet TAKE 1 TABLET BY MOUTH EVERY DAY  30 tablet  3  . losartan (COZAAR) 100 MG tablet Take 1 tablet (100 mg total) by mouth daily.  30 tablet  5  . metoprolol succinate (TOPROL-XL) 50 MG 24 hr tablet Take 1 tablet (50 mg total) by mouth daily. 1 and 1/2 tablets (total 75mg ) daily. Take with or immediately following a meal.  90 tablet  3  . potassium chloride (K-DUR) 10 MEQ tablet Take 1 tablet (10 mEq total) by mouth daily.  30 tablet  6  . tolterodine (DETROL) 2 MG tablet Take by mouth.      . vitamin C (ASCORBIC ACID) 500 MG tablet Take 500 mg by mouth daily.       No current facility-administered medications for this visit.    Allergies  Allergen Reactions  . Cephalexin Hives and Other (See Comments)    "throat started closing up"  . Rocephin [Ceftriaxone Sodium In Dextrose] Anaphylaxis  . Penicillins Rash  . Flecainide Acetate Other (See Comments)    "couldn't take it"  . Moxifloxacin     Does not remember the reaction  . Sulfonamide Derivatives     Does not remember reaction ; "was so young when I had reaction to it"    History   Social History  . Marital Status: Married    Spouse Name: N/A    Number of Children: N/A  . Years of Education: N/A   Occupational History  . Not on file.   Social History Main Topics  . Smoking status: Never Smoker   . Smokeless tobacco: Never Used  . Alcohol Use: No  . Drug Use: No  . Sexual Activity: Yes     Comment: LAVH   Other Topics Concern  . 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    Alcohol Use - no    Family History  Problem Relation Age of Onset  . Heart attack      granfather  . Hypertension Father   . Diabetes Father   . Dementia Father   . Atrial fibrillation Mother   . Diabetes  Sister   . Diabetes Brother   . Heart disease Maternal Grandmother     ROS-  All systems are reviewed and are negative except as outlined in the HPI above  Physical Exam: There were no vitals filed for this  visit.  GEN- The patient is overweight appearing, alert and oriented x 3 today.   Head- normocephalic, atraumatic Eyes-  Sclera clear, conjunctiva pink Ears- hearing intact Oropharynx- clear Neck- supple, no JVP Lymph- no cervical lymphadenopathy Lungs- Clear to ausculation bilaterally, normal work of breathing Heart- irregular rate and rhythm, no murmurs, rubs or gallops, PMI not laterally displaced GI- soft, NT, ND, + BS Extremities- no clubbing, cyanosis, or edema MS- no significant deformity or atrophy Skin- no rash or lesion Psych- euthymic mood, full affect Neuro- strength and sensation are intact  Assessment and Plan:  Atypical atrial flutter She feels better off of propafenone and on amiodarone but remains in atrial flutter. She wishes to avoid ablation.  I have therefore advised cardioversion.  Risks, benefits, and alternatives to this procedure were discussed with the patient who wishes to proceed. She has been adequately anticoagulated with eliquis.  htn Stable No change required today  Obesity Weight loss is advised  Return to see me in 6 weeks

## 2013-12-23 NOTE — Anesthesia Postprocedure Evaluation (Signed)
  Anesthesia Post-op Note  Patient: Heather NipperCynthia M Fake  Procedure(s) Performed: Procedure(s) with comments: CARDIOVERSION (N/A) - 9:27 Propofol 70mg , IV   150 joules synched shock by Dr. Delton SeeNelson @ 150 joules...SR   post 12 lead ordered.   Patient Location: PACU and Endoscopy Unit  Anesthesia Type:General  Level of Consciousness: awake, alert  and oriented  Airway and Oxygen Therapy: Patient Spontanous Breathing and Patient connected to nasal cannula oxygen  Post-op Pain: none  Post-op Assessment: Post-op Vital signs reviewed and Patient's Cardiovascular Status Stable  Post-op Vital Signs: Reviewed and stable  Complications: No apparent anesthesia complications

## 2013-12-23 NOTE — CV Procedure (Signed)
    Cardioversion Note  Heather EatonCynthia M [name] [id number] May 28, 1951  Procedure: DC Cardioversion Indications: atrial flutter   Procedure Details Consent: Obtained Time Out: Verified patient identification, verified procedure, site/side was marked, verified correct patient position, special equipment/implants available, Radiology Safety Procedures followed,  medications/allergies/relevent history reviewed, required imaging and test results available.  Performed  The patient has been on adequate anticoagulation.  The patient received IV Propofol by anesthesia staff for sedation.  Synchronous cardioversion was performed at 150 joules.  The cardioversion was successful.  Complications: No apparent complications Patient did tolerate procedure well.   Lars MassonNELSON, Emily Forse H, MD, [facility] Hospital[facility]ACC 12/23/2013, 9:21 AM

## 2013-12-23 NOTE — Interval H&P Note (Signed)
History and Physical Interval Note:  12/23/2013 9:20 AM  Darrin Nipperynthia M Dingwall  has presented today for surgery, with the diagnosis of AFIB  The various methods of treatment have been discussed with the patient and family. After consideration of risks, benefits and other options for treatment, the patient has consented to  Procedure(s): CARDIOVERSION (N/A) as a surgical intervention .  The patient's history has been reviewed, patient examined, no change in status, stable for surgery.  I have reviewed the patient's chart and labs.  Questions were answered to the patient's satisfaction.     Lars MassonNELSON, Dayona Shaheen H

## 2013-12-23 NOTE — Anesthesia Procedure Notes (Signed)
Date/Time: 12/23/2013 9:25 AM Performed by: Gwenyth AllegraADAMI, Delbra Zellars Pre-anesthesia Checklist: Patient identified, Suction available, Patient being monitored and Timeout performed Patient Re-evaluated:Patient Re-evaluated prior to inductionOxygen Delivery Method: Ambu bag Preoxygenation: Pre-oxygenation with 100% oxygen Intubation Type: IV induction Ventilation: Mask ventilation without difficulty

## 2013-12-23 NOTE — Transfer of Care (Signed)
Immediate Anesthesia Transfer of Care Note  Patient: Heather [name]  Procedure(s) Performed: Procedure(s) with comments: CARDIOVERSION (N/A) - 9:27 Propofol 70mg , IV   150 joules synched shock by Dr. Delton SeeNelson @ 150 joules...SR   post 12 lead ordered.   Patient Location: PACU  Anesthesia Type:General  Level of Consciousness: awake, alert  and oriented  Airway & Oxygen Therapy: Patient Spontanous Breathing and Patient connected to nasal cannula oxygen  Post-op Assessment: Report given to PACU RN and Post -op Vital signs reviewed and stable  Post vital signs: Reviewed and stable  Complications: No apparent anesthesia complications

## 2013-12-23 NOTE — Anesthesia Preprocedure Evaluation (Addendum)
Anesthesia Evaluation  Patient identified by MRN, date of birth, ID band Patient awake    History of Anesthesia Complications Negative for: history of anesthetic complications  Airway Mallampati: II TM Distance: >3 FB Neck ROM: Full    Dental  (+) Dental Advisory Given, Teeth Intact   Pulmonary sleep apnea ,    Pulmonary exam normal       Cardiovascular hypertension, +CHF + dysrhythmias Atrial Fibrillation Rhythm:Irregular     Neuro/Psych negative psych ROS   GI/Hepatic GERD-  ,  Endo/Other  Hypothyroidism   Renal/GU      Musculoskeletal   Abdominal   Peds  Hematology   Anesthesia Other Findings   Reproductive/Obstetrics                          Anesthesia Physical Anesthesia Plan  ASA: III  Anesthesia Plan: General   Post-op Pain Management:    Induction: Intravenous  Airway Management Planned: Mask  Additional Equipment:   Intra-op Plan:   Post-operative Plan:   Informed Consent: I have reviewed the patients History and Physical, chart, labs and discussed the procedure including the risks, benefits and alternatives for the proposed anesthesia with the patient or authorized representative who has indicated his/her understanding and acceptance.   Dental advisory given  Plan Discussed with: CRNA, Anesthesiologist and Surgeon  Anesthesia Plan Comments:         Anesthesia Quick Evaluation

## 2013-12-23 NOTE — Preoperative (Signed)
Beta Blockers   Reason not to administer Beta Blockers:Not Applicable 

## 2013-12-26 ENCOUNTER — Encounter (HOSPITAL_COMMUNITY): Payer: Self-pay | Admitting: Cardiology

## 2013-12-29 ENCOUNTER — Telehealth: Payer: Self-pay | Admitting: Internal Medicine

## 2013-12-29 NOTE — Telephone Encounter (Signed)
New message          Pt had a cardioversion last Friday. Pt is experiencing sob. Is this normal? Pt is not experiencing sob right now but if she gets up and moves around is when it occurs.

## 2013-12-29 NOTE — Telephone Encounter (Signed)
Could be out of rhythm again.  Weight is stable, and she feels her pulse rate is normal.  Just experienced SOB while walking into church Sunday.  This is not at rest and she does not sound winded over the phone.  She would like for me to discuss with Dr Johney FrameAllred on Monday and call her back with his suggestions.

## 2014-01-03 ENCOUNTER — Telehealth: Payer: Self-pay | Admitting: Family Medicine

## 2014-01-03 ENCOUNTER — Ambulatory Visit (INDEPENDENT_AMBULATORY_CARE_PROVIDER_SITE_OTHER): Payer: BC Managed Care – PPO | Admitting: *Deleted

## 2014-01-03 VITALS — BP 134/90 | HR 67 | Resp 18

## 2014-01-03 DIAGNOSIS — I4892 Unspecified atrial flutter: Secondary | ICD-10-CM

## 2014-01-03 NOTE — Telephone Encounter (Signed)
Message copied by Ricard DillonWILLIS, Nero Sawatzky B on Tue Jan 03, 2014  3:49 PM ------      Message from: Harriet MassonOBERTS, CHELSEA N      Created: Tue Jan 03, 2014  2:06 PM      Regarding: RX      Contact: [phone]       PT seen Dr Tanya NonesPickard a while back for wheezing and he changed her BP medication and she was told if the wheezing came back that he would give her an acid reducer if that could be called in at [facility] ------

## 2014-01-03 NOTE — Patient Instructions (Signed)
Remain on same medications. Return to see Dr.Allred on 4/24. Contact PCP concerning possible reflux.

## 2014-01-03 NOTE — Telephone Encounter (Signed)
Begin protonix 40 mg poqday and if stridor persists in 2-3 weeks, NTBS.

## 2014-01-03 NOTE — Progress Notes (Signed)
Patient here for a nurse visit for an EKG S/P cardioversion 3/13. She had called on 3/19 with complaints of SOB with ambulation and came in today to check her heart rhythm. States that SOB is not as bad as it was back on 3/19. Complaining of wheezing in the upper throat the last 1 to 2 weeks with history of reflux and gastric surgery. She will call her PCP concerning possibly returning back on medication for reflux. EKG today shows sinus rhythm with a ventricular rate of 67. Dr.Nelson reviewed her EKG and advised to remain on the same medications and follow up with Dr.Allred next month.

## 2014-01-03 NOTE — Telephone Encounter (Signed)
I asked the patient to come in for an EKG today to see if she was in NSR.  Her EKG was NSR with a rate of 67.  SOB is better and she will follow up with Dr Johney FrameAllred as scheduled.  She will call back with any other problems or if her symptoms worsen

## 2014-01-05 MED ORDER — PANTOPRAZOLE SODIUM 40 MG PO TBEC: 40.0000 mg | DELAYED_RELEASE_TABLET | Freq: Two times a day (BID) | ORAL | Status: DC

## 2014-01-05 NOTE — Telephone Encounter (Signed)
Pt aware per vm.  

## 2014-01-26 ENCOUNTER — Ambulatory Visit (INDEPENDENT_AMBULATORY_CARE_PROVIDER_SITE_OTHER): Payer: BC Managed Care – PPO | Admitting: Physician Assistant

## 2014-01-26 ENCOUNTER — Encounter: Payer: Self-pay | Admitting: Physician Assistant

## 2014-01-26 VITALS — BP 130/88 | HR 88 | Temp 98.1°F | Resp 18 | Wt 338.0 lb

## 2014-01-26 DIAGNOSIS — J988 Other specified respiratory disorders: Secondary | ICD-10-CM

## 2014-01-26 DIAGNOSIS — R061 Stridor: Secondary | ICD-10-CM

## 2014-01-26 DIAGNOSIS — B9689 Other specified bacterial agents as the cause of diseases classified elsewhere: Principal | ICD-10-CM

## 2014-01-26 DIAGNOSIS — A499 Bacterial infection, unspecified: Secondary | ICD-10-CM

## 2014-01-26 MED ORDER — LEVOFLOXACIN 750 MG PO TABS: 750.0000 mg | ORAL_TABLET | Freq: Every day | ORAL | Status: DC

## 2014-01-26 MED ORDER — HYDROCOD POLST-CHLORPHEN POLST 10-8 MG/5ML PO LQCR: 5.0000 mL | Freq: Two times a day (BID) | ORAL | Status: DC | PRN

## 2014-01-26 NOTE — Progress Notes (Signed)
Patient ID: Heather [name] MRN: [id number], DOB: [birth year], 63 y.o. Date of Encounter: 01/26/2014, 3:16 PM    Chief Complaint:  Chief Complaint  Patient presents with  . bad cold x 1 week    cough keeping up at night     HPI: 63 y.o. year old female reports that she's been sick for one week. Reports that she has a lot of nasal congestion but is not blowing much out of her nose. She reports that she does have a lot of chest congestion with cough. Has had no sore throat and no ear pain. No known fevers or chills.  She also reports that she has recently been seeing Dr. Tanya Nones secondary to a wheezing and whistling sound coming from her throat. She says that she has made all of the medicine changes that he recommended but she is still having the same symptoms--says it has not resolved. She says that he told her he would get a scan if the symptoms did not resolve. She is wanting to go ahead and take care of this while she is here today. She reports that the symptoms are the same as they have been. No new changes.     Home Meds: See attached medication section for any medications that were entered at today's visit. The computer does not put those onto this list.The following list is a list of meds entered prior to today's visit.   Current Outpatient Prescriptions on File Prior to Visit  Medication Sig Dispense Refill  . amiodarone (PACERONE) 200 MG tablet Take 200 mg by mouth daily.      Marland Kitchen amitriptyline (ELAVIL) 10 MG tablet TAKE 1 TABLET BY MOUTH AT BEDTIME.  30 tablet  5  . cetirizine (ZYRTEC) 10 MG tablet Take 10 mg by mouth daily.      . Cholecalciferol (VITAMIN D) 2000 UNITS tablet Take 2,000 Units by mouth daily.      Marland Kitchen diltiazem (CARDIZEM CD) 120 MG 24 hr capsule TAKE ONE CAPSULE EVERY DAY  30 capsule  3  . ELIQUIS 5 MG TABS tablet TAKE 1 TABLET BY MOUTH TWICE A DAY  60 tablet  3  . guaiFENesin (MUCINEX) 600 MG 12 hr tablet Take 600 mg by mouth 2 (two) times daily as needed. For  allergies.      Marland Kitchen losartan (COZAAR) 100 MG tablet Take 1 tablet (100 mg total) by mouth daily.  30 tablet  5  . metoprolol succinate (TOPROL-XL) 50 MG 24 hr tablet Take 50 mg by mouth daily. Take with or immediately following a meal.      . pantoprazole (PROTONIX) 40 MG tablet Take 1 tablet (40 mg total) by mouth 2 (two) times daily.  60 tablet  11  . tolterodine (DETROL) 2 MG tablet Take 2 mg by mouth daily.        Current Facility-Administered Medications on File Prior to Visit  Medication Dose Route Frequency Provider Last Rate Last Dose  . sodium chloride 0.9 % injection 3 mL  3 mL Intravenous Q12H Hillis Range, MD      . sodium chloride 0.9 % injection 3 mL  3 mL Intravenous PRN Hillis Range, MD        Allergies:  Allergies  Allergen Reactions  . Cephalexin Hives and Other (See Comments)    "throat started closing up"  . Rocephin [Ceftriaxone Sodium In Dextrose] Anaphylaxis  . Penicillins Rash  . Flecainide Acetate Other (See Comments)    "couldn't take it"  .  Moxifloxacin     Does not remember the reaction  . Sulfonamide Derivatives     Does not remember reaction ; "was so young when I had reaction to it"      Review of Systems: See HPI for pertinent ROS. All other ROS negative.    Physical Exam: Blood pressure 130/88, pulse 88, temperature 98.1 F (36.7 C), temperature source Oral, resp. rate 18, weight 338 lb (153.316 kg)., Body mass index is 55.37 kg/(m^2). General:  Severely Obese WF. Appears in no acute distress. HEENT: Normocephalic, atraumatic, eyes without discharge, sclera non-icteric, nares are without discharge. Bilateral auditory canals clear, TM's are without perforation, pearly grey and translucent with reflective cone of light bilaterally. Oral cavity moist, posterior pharynx without exudate, erythema, peritonsillar abscess. No tenderness with percussion of frontal and maxillary sinuses bilaterally.  Neck: Supple. No thyromegaly. No lymphadenopathy. No  tracheal deviation present. Lungs: Clear bilaterally to auscultation without wheezes, rales, or rhonchi. Breathing is unlabored. No stridor. No wheezes. No rales. Heart: Regular rhythm. Msk:  Strength and tone normal for age. Extremities/Skin: Warm and dry. Neuro: Alert and oriented X 3. Moves all extremities spontaneously. Gait is normal. CNII-XII grossly in tact. Psych:  Responds to questions appropriately with a normal affect.     ASSESSMENT AND PLAN:  63 y.o. year old female with  1. Bacterial respiratory infection SHe has multiple medication allergies. However she reports that she has taken Levaquin in the past with no adverse effects. As well she has history of arrhythmias and is on antiarrhythmic medication. Therefore I think we should avoid azithromycin. She says she has used LawyerTessalon Perles in the past and they were completely ineffective for her. Says that unfortunately Tussionex is the only medicine she's ever used it actually controls her cough. Requesting prescription for this. Told her to take the Levaquin as directed. Followup if symptoms do not resolve after completion of this. - levofloxacin (LEVAQUIN) 750 MG tablet; Take 1 tablet (750 mg total) by mouth daily.  Dispense: 7 tablet; Refill: 0 - chlorpheniramine-HYDROcodone (TUSSIONEX PENNKINETIC ER) 10-8 MG/5ML LQCR; Take 5 mLs by mouth every 12 (twelve) hours as needed for cough.  Dispense: 115 mL; Refill: 0   2. subacute stridor I Reviewed Dr. Caren MacadamPickard's last office visit note regarding this-- which is dated 10/14/13. It states the patient had been referred to him by cardiologist secondary to a wheezing/whistling sound on expiration coming from her throat particularly when she would lie down at night. She denied any shortness of breath. Denied any wheezing in the lungs. She did report a dry nonproductive cough that had been going on for several months and began around the same time the stridor began to occur. She denied any  apnea. Denied any snoring. Denied any acid reflux. Denied any postnasal drip or sinusitis. She denied any dysphagia or odynophagia. She denied any weight loss or hemoptysis. She had found that the symptoms primarily were occurring at night. She was not sure if this was because of her position when she is lying down at night or if it is because she is not preoccupied with other things when she is lying there at night. Physical exam of the neck and lungs was normal at that time. At that visit it was felt that the sound that she described was representing a narrowing of her upper airway rather than bronchogenic wheezing. He felt that this could be due to several possible causes: ACE inhibitor, laryngo- esophageal reflux, postnasal drip/allergies, laryngeal mass. He had her  discontinue the lisinopril and replace it with Benicar 40 mg daily. If symptoms did not improve then he would start her on empiric PPI. If symptoms continued to persist after that and he would obtain a CT scan of the neck to rule out obstructive mass. Today patient tells me that she did stop the lisinopril and change to Benicar as directed. At today's office visit she has been taking the PPI for 3 weeks.  She says that the symptoms have continued to persist and have not decreased or changed at all. Wants to go ahead and schedule scan.  I will discuss with Dr. Tanya NonesPickard and then schedule the scan if he is agreeable. Dr. Tanya NonesPickard agreeable for me to go ahead and order scan--order placed.     [address]igned, Alaia Lordi Beth OlivarezDixon, [place], [facility] 01/26/2014 3:16 PM

## 2014-02-02 ENCOUNTER — Ambulatory Visit
Admission: RE | Admit: 2014-02-02 | Discharge: 2014-02-02 | Disposition: A | Discharge status: Home / Self Care | Payer: BC Managed Care – PPO | Source: Ambulatory Visit | Attending: Physician Assistant | Admitting: Physician Assistant

## 2014-02-02 DIAGNOSIS — R061 Stridor: Secondary | ICD-10-CM

## 2014-02-02 MED ORDER — IOHEXOL 300 MG/ML  SOLN
75.0000 mL | Freq: Once | INTRAMUSCULAR | Status: AC | PRN
  Administered 2014-02-02: 75 mL via INTRAVENOUS

## 2014-02-03 ENCOUNTER — Ambulatory Visit (INDEPENDENT_AMBULATORY_CARE_PROVIDER_SITE_OTHER): Payer: BC Managed Care – PPO | Admitting: Internal Medicine

## 2014-02-03 ENCOUNTER — Encounter: Payer: Self-pay | Admitting: Internal Medicine

## 2014-02-03 ENCOUNTER — Telehealth: Payer: Self-pay | Admitting: Family Medicine

## 2014-02-03 VITALS — BP 148/80 | HR 102 | Ht 65.5 in | Wt 344.0 lb

## 2014-02-03 DIAGNOSIS — J385 Laryngeal spasm: Secondary | ICD-10-CM

## 2014-02-03 DIAGNOSIS — I4891 Unspecified atrial fibrillation: Secondary | ICD-10-CM

## 2014-02-03 DIAGNOSIS — Q315 Congenital laryngomalacia: Secondary | ICD-10-CM

## 2014-02-03 DIAGNOSIS — I484 Atypical atrial flutter: Secondary | ICD-10-CM

## 2014-02-03 DIAGNOSIS — I4892 Unspecified atrial flutter: Secondary | ICD-10-CM

## 2014-02-03 DIAGNOSIS — I10 Essential (primary) hypertension: Secondary | ICD-10-CM

## 2014-02-03 NOTE — Patient Instructions (Signed)
Your physician recommends that you continue on your current medications as directed. Please refer to the Current Medication list given to you today.  You have been referred to Dr. Cly[facility] Braunavid [place] [facility].   Your physician recommends that you schedule a follow-up appointment in: 3 months with Dr. Johney FrameAllred.

## 2014-02-03 NOTE — Telephone Encounter (Signed)
Message copied by Donne AnonPLUMMER, Tomothy Eddins M on Fri Feb 03, 2014  4:04 PM ------      Message from: Allayne ButcherIXON, MARY      Created: Thu Feb 02, 2014  4:40 PM       Tell pt that CT shows no abnormality.      Tell her I have informed Dr. Tanya NonesPickard / Discussed it with Dr. Tanya NonesPickard (She had been seeing Dr. Tanya NonesPickard about this)      Tell her that we now recommend that she see ENT so they can do laryngoscopy.       Place referral to ENT.  Dx: Stridor sound --coming from the neck- ------

## 2014-02-03 NOTE — Progress Notes (Signed)
PCP:  Leo GrosserPICKARD,[name], MD  The patient presents today for routine electrophysiology followup.  Since last being seen in our clinic, the patient reports doing reasonably well.   She underwent cardioversion 3/15.  She reports improvement in her energy.  Unfortunately, she has since returned to atypical atrial flutter.  Today, she denies symptoms of palpitations, chest pain, shortness of breath, orthopnea, PND, lower extremity edema, dizziness, presyncope, syncope, or neurologic sequela.  The patient feels that she is tolerating medications without difficulties and is otherwise without complaint today.   Past Medical History  Diagnosis Date  . HTN (hypertension)   . Obesity   . Carpal tunnel syndrome   . Anemia     "onc;e; had to take iron for awhile"  . Degenerative disk disease   . History of blood transfusion     "w/both hip replacements"  . IBS (irritable bowel syndrome)   . Morton's neuroma     right foot  . Atrial fibrillation     Began in 2004.  [facility].  She is off coumadin  now with no noted recurrent atrial fibrillation since 2007. til 04/01/12; s/p initiation of Rhythmol with DCCV June 2013  . Hypothyroidism     no meds currently  . Sleep apnea 04/01/12    "dx'd just last week"  . Sleep apnea     uses CPAP  . Interstitial cystitis   . GERD (gastroesophageal reflux disease)     resolved after lap band  . Shortness of breath    Past Surgical History  Procedure Laterality Date  . Back surgery    . Carpal tunnel release  1990's    bilaterally  . Lasik    . Cardiac catheterization    . Laparoscopic gastric banding  2009  . Cardiac electrophysiology mapping and ablation  ~ 2005 and 2007    "did more 2nd time; [facility]"  . Tonsillectomy and adenoidectomy  1957  . Vaginal hysterectomy  2000  . Tubal ligation  1980  . Dilation and curettage of uterus  2000  . Total hip arthroplasty  ~2009; 2010    right; left  .  Joint replacement      bilateral hips  . Posterior fusion lumbar spine  2000's    "nerve problems"  . Posterior fusion lumbar spine  2000's  . Cardioversion  04/02/2012    Procedure: CARDIOVERSION;  Surgeon: Laurey Moralealton S McLean, MD;  [facility];  Service: Cardiovascular;  Laterality: N/A;  . Cardioversion  06/16/2012    Procedure: CARDIOVERSION;  Surgeon: Vesta MixerPhilip J Nahser, MD;  [facility];  Service: Cardiovascular;  Laterality: N/A;  amanda/ebp/Beverly( or scheduling)  . Anterior and posterior repair  10/20/2012    Procedure: ANTERIOR (CYSTOCELE) AND POSTERIOR REPAIR (RECTOCELE);  Surgeon: Esmeralda ArthurSandra A Rivard, MD;  Location: [facility] ORS;  Service: Gynecology;  Laterality: N/A;  2 hours  . Cardioversion  10/29/2012    Procedure: CARDIOVERSION;  Surgeon: Laurey Moralealton S McLean, MD;  [facility];  Service: Cardiovascular;  Laterality: N/A;  . Tee without cardioversion N/A 12/29/2012    Procedure: TRANSESOPHAGEAL ECHOCARDIOGRAM (TEE);  Surgeon: Peter M SwazilandJordan, MD;  [facility];  Service: Cardiovascular;  Laterality: N/A;  . Cardioversion N/A 03/30/2013    Procedure: CARDIOVERSION;  Surgeon: Laurey Moralealton S McLean, MD;  [facility];  Service: Cardiovascular;  Laterality: N/A;  . Cardioversion N/A 12/23/2013    Procedure: CARDIOVERSION;  Surgeon: Lars MassonKatarina H Nelson, MD;  Location: [facility] ENDOSCOPY;  Service: Cardiovascular;  Laterality: N/A;  9:27 Propofol 70mg , IV   150 joules synched shock by Dr. Delton See @ 150 joules...SR   post 12 lead ordered.     Current Outpatient Prescriptions  Medication Sig Dispense Refill  . amiodarone (PACERONE) 200 MG tablet Take 200 mg by mouth daily.      Marland Kitchen amitriptyline (ELAVIL) 10 MG tablet TAKE 1 TABLET BY MOUTH AT BEDTIME.  30 tablet  5  . cetirizine (ZYRTEC) 10 MG tablet Take 10 mg by mouth daily.      . chlorpheniramine-HYDROcodone (TUSSIONEX PENNKINETIC ER) 10-8 MG/5ML LQCR Take 5 mLs by mouth every 12 (twelve) hours as needed for cough.  115 mL  0  .  Cholecalciferol (VITAMIN D) 2000 UNITS tablet Take 2,000 Units by mouth daily.      Marland Kitchen diltiazem (CARDIZEM CD) 120 MG 24 hr capsule TAKE ONE CAPSULE EVERY DAY  30 capsule  3  . ELIQUIS 5 MG TABS tablet TAKE 1 TABLET BY MOUTH TWICE A DAY  60 tablet  3  . furosemide (LASIX) 20 MG tablet Take 20 mg by mouth daily as needed.      Marland Kitchen guaiFENesin (MUCINEX) 600 MG 12 hr tablet Take 600 mg by mouth 2 (two) times daily as needed. For allergies.      Marland Kitchen losartan (COZAAR) 100 MG tablet Take 1 tablet (100 mg total) by mouth daily.  30 tablet  5  . metoprolol succinate (TOPROL-XL) 50 MG 24 hr tablet Take 50 mg by mouth daily. Take with or immediately following a meal.      . pantoprazole (PROTONIX) 40 MG tablet Take 1 tablet (40 mg total) by mouth 2 (two) times daily.  60 tablet  11  . potassium chloride (K-DUR) 10 MEQ tablet Take 10 mEq by mouth daily as needed.      . tolterodine (DETROL) 2 MG tablet Take 2 mg by mouth daily.        Current Facility-Administered Medications  Medication Dose Route Frequency Provider Last Rate Last Dose  . sodium chloride 0.9 % injection 3 mL  3 mL Intravenous Q12H Hillis Range, MD      . sodium chloride 0.9 % injection 3 mL  3 mL Intravenous PRN Hillis Range, MD        Allergies  Allergen Reactions  . Cephalexin Hives and Other (See Comments)    "throat started closing up"  . Rocephin [Ceftriaxone Sodium In Dextrose] Anaphylaxis  . Penicillins Rash  . Flecainide Acetate Other (See Comments)    "couldn't take it"  . Moxifloxacin     Does not remember the reaction  . Sulfonamide Derivatives     Does not remember reaction ; "was so young when I had reaction to it"    History   Social History  . Marital Status: Married    Spouse Name: N/A    Number of Children: N/A  . Years of Education: N/A   Occupational History  . Not on file.   Social History Main Topics  . Smoking status: Never Smoker   . Smokeless tobacco: Never Used  . Alcohol Use: No  . Drug Use:  No  . Sexual Activity: Yes     Comment: LAVH   Other Topics Concern  . Not on file   Social History Narrative   Works in data entry in [employer]   Married, lives in [place]   Tobacco Use - No.    Alcohol Use -  no    Family History  Problem Relation Age of Onset  . Heart attack      granfather  . Hypertension Father   . Diabetes Father   . Dementia Father   . Atrial fibrillation Mother   . Diabetes Sister   . Diabetes Brother   . Heart disease Maternal Grandmother     ROS-  All systems are reviewed and are negative except as outlined in the HPI above  Physical Exam: Filed Vitals:   02/03/14 1441  BP: 148/80  Pulse: 102  Height: 5' 5.5" (1.664 m)  Weight: 344 lb (156.037 kg)    GEN- The patient is overweight appearing, alert and oriented x 3 today.   Head- normocephalic, atraumatic Eyes-  Sclera clear, conjunctiva pink Ears- hearing intact Oropharynx- clear Neck- supple, Lungs- Clear to ausculation bilaterally, normal work of breathing Heart- irregular rate and rhythm, no murmurs, rubs or gallops, PMI not laterally displaced GI- soft, NT, ND, + BS Extremities- no clubbing, cyanosis, or edema MS- no significant deformity or atrophy Skin- no rash or lesion Psych- euthymic mood, full affect Neuro- strength and sensation are intact  ekg today reveals atypical atrial flutter  Assessment and Plan:  Atypical atrial flutter Recurrent s/p recent cardioversion.  She has failed medical therapy with rhythmol and amiodarone.  I would therefore advised repeat ablation at this time.  She has had two prior ablations by Dr Sampson GoonFitzgerald.  I have informed her that he is at [facility] and have encouraged her to discuss ablation with him as she has been so pleased with his work previously.  She is therefore referred to Dr Sampson GoonFitzgerald.  Hopefully he can assist with her procedure.  If not, then I am happy to discuss this with her further upon return. No medicine changes  are made today.  htn Stable No change required today  Obesity Weight loss is advised  Return to see me in 3 months

## 2014-02-03 NOTE — Telephone Encounter (Signed)
Pt returning my call about CT results.  Told CT showed no abnormalities.  Providers recommend ENT referral and referral ordered.

## 2014-03-06 ENCOUNTER — Other Ambulatory Visit: Payer: Self-pay | Admitting: Internal Medicine

## 2014-03-13 ENCOUNTER — Other Ambulatory Visit: Payer: Self-pay | Admitting: Internal Medicine

## 2014-03-27 ENCOUNTER — Telehealth: Payer: Self-pay | Admitting: *Deleted

## 2014-03-27 NOTE — Telephone Encounter (Signed)
Called pt back and she is stating that she is needing to go to have another sleep study done, states that her husband has told her that when she sleeps she has shallow breathing, she states she isnt snoring just concerned about the swallowing breathing. [facility] for sleep test. MD please advise.

## 2014-03-27 NOTE — Telephone Encounter (Signed)
Please get a copy of the sleep study first , you can check her old chart to see if it is there We need to review it before another one can be set up

## 2014-03-27 NOTE — Telephone Encounter (Signed)
Message copied by Samuella CotaSIMMONS, Meeyah Ovitt T on Mon Mar 27, 2014  2:40 PM ------      Message from: Malvin JohnsBULLINS, SUSAN S      Created: Mon Mar 27, 2014  9:02 AM       Patient would like a referral for another sleep study if possible with the same place she went to last time       [phone] ------

## 2014-03-28 NOTE — Telephone Encounter (Signed)
Copy of sleep study placed on Dr. Jeanice Limurham desk for review.

## 2014-04-04 ENCOUNTER — Other Ambulatory Visit: Payer: Self-pay | Admitting: Family Medicine

## 2014-04-04 DIAGNOSIS — G4733 Obstructive sleep apnea (adult) (pediatric): Secondary | ICD-10-CM

## 2014-04-10 ENCOUNTER — Other Ambulatory Visit: Payer: Self-pay | Admitting: Family Medicine

## 2014-04-27 ENCOUNTER — Encounter: Payer: Self-pay | Admitting: Physician Assistant

## 2014-04-27 ENCOUNTER — Ambulatory Visit (INDEPENDENT_AMBULATORY_CARE_PROVIDER_SITE_OTHER): Payer: BC Managed Care – PPO | Admitting: Physician Assistant

## 2014-04-27 VITALS — BP 136/84 | HR 78 | Temp 98.0°F | Resp 14 | Wt 347.0 lb

## 2014-04-27 DIAGNOSIS — A499 Bacterial infection, unspecified: Secondary | ICD-10-CM

## 2014-04-27 DIAGNOSIS — J988 Other specified respiratory disorders: Secondary | ICD-10-CM

## 2014-04-27 DIAGNOSIS — B9689 Other specified bacterial agents as the cause of diseases classified elsewhere: Principal | ICD-10-CM

## 2014-04-27 MED ORDER — LEVOFLOXACIN 750 MG PO TABS: 750.0000 mg | ORAL_TABLET | Freq: Every day | ORAL | Status: DC

## 2014-04-27 NOTE — Progress Notes (Signed)
Patient ID: Heather [name] MRN: [id number], DOB: [birth year], 63 y.o. Date of Encounter: 04/27/2014, 9:59 AM    Chief Complaint:  Chief Complaint  Patient presents with  . URI    x5 days- non-productive cough, head congestion, throat wheezing     HPI: 63 y.o. year old female reports that she's been sick for one week. Reports that she has a lot of nasal congestion but is not blowing much out of her nose. She reports that she does have a lot of chest congestion with cough. Has had no sore throat and no ear pain. No known fevers or chills.    Home Meds:    Current Outpatient Prescriptions on File Prior to Visit  Medication Sig Dispense Refill  . amiodarone (PACERONE) 200 MG tablet Take 1 tablet (200 mg total) by mouth daily.  90 tablet  0  . amitriptyline (ELAVIL) 10 MG tablet TAKE 1 TABLET BY MOUTH AT BEDTIME.  30 tablet  5  . cetirizine (ZYRTEC) 10 MG tablet Take 10 mg by mouth daily.      . chlorpheniramine-HYDROcodone (TUSSIONEX PENNKINETIC ER) 10-8 MG/5ML LQCR Take 5 mLs by mouth every 12 (twelve) hours as needed for cough.  115 mL  0  . Cholecalciferol (VITAMIN D) 2000 UNITS tablet Take 2,000 Units by mouth daily.      Marland Kitchen ELIQUIS 5 MG TABS tablet TAKE 1 TABLET BY MOUTH TWICE A DAY  60 tablet  3  . furosemide (LASIX) 20 MG tablet Take 20 mg by mouth daily as needed.      Marland Kitchen guaiFENesin (MUCINEX) 600 MG 12 hr tablet Take 600 mg by mouth 2 (two) times daily as needed. For allergies.      Marland Kitchen losartan (COZAAR) 100 MG tablet TAKE 1 TABLET BY MOUTH EVERY DAY  30 tablet  5  . metoprolol succinate (TOPROL-XL) 50 MG 24 hr tablet Take 50 mg by mouth daily. Take with or immediately following a meal.      . pantoprazole (PROTONIX) 40 MG tablet Take 1 tablet (40 mg total) by mouth 2 (two) times daily.  60 tablet  11  . potassium chloride (K-DUR) 10 MEQ tablet Take 10 mEq by mouth daily as needed.      . tolterodine (DETROL) 2 MG tablet Take 2 mg by mouth daily.        Current  Facility-Administered Medications on File Prior to Visit  Medication Dose Route Frequency Provider Last Rate Last Dose  . sodium chloride 0.9 % injection 3 mL  3 mL Intravenous Q12H Hillis Range, MD      . sodium chloride 0.9 % injection 3 mL  3 mL Intravenous PRN Hillis Range, MD        Allergies:  Allergies  Allergen Reactions  . Cephalexin Hives and Other (See Comments)    "throat started closing up"  . Rocephin [Ceftriaxone Sodium In Dextrose] Anaphylaxis  . Penicillins Rash  . Flecainide Acetate Other (See Comments)    "couldn't take it"  . Moxifloxacin     Does not remember the reaction  . Sulfonamide Derivatives     Does not remember reaction ; "was so young when I had reaction to it"      Review of Systems: See HPI for pertinent ROS. All other ROS negative.    Physical Exam: Blood pressure 136/84, pulse 78, temperature 98 F (36.7 C), temperature source Oral, resp. rate 14, weight 347 lb (157.398 kg)., Body mass index is 56.85 kg/(m^2). General:  Severely Obese WF. Appears in no acute distress. HEENT: Normocephalic, atraumatic, eyes without discharge, sclera non-icteric, nares are without discharge. Bilateral auditory canals clear, TM's are without perforation, pearly grey and translucent with reflective cone of light bilaterally. Oral cavity moist, posterior pharynx without exudate, erythema, peritonsillar abscess. No tenderness with percussion of frontal and maxillary sinuses bilaterally.  Neck: Supple. No thyromegaly. No lymphadenopathy. No tracheal deviation present. Lungs: Clear bilaterally to auscultation without wheezes, rales, or rhonchi. Breathing is unlabored. No stridor. No wheezes. No rales. Heart: Irregular rhythm. Msk:  Strength and tone normal for age. Extremities/Skin: Warm and dry. Neuro: Alert and oriented X 3. Moves all extremities spontaneously. Gait is normal. CNII-XII grossly in tact. Psych:  Responds to questions appropriately with a normal affect.       ASSESSMENT AND PLAN:  63 y.o. year old female with  1. Bacterial respiratory infection She has multiple medication allergies. She is on Amiodarone.  The combination of the above makes this very difficult. Discussed that I am concerned about continuing to Rx Levaquin b/c concerned she will develop resistance.  However, concerned about QT prolongation also.  We discussed all of this today regarding risk/benefit of different antibiotics.   However she reports that she has taken Levaquin in the past with no adverse effects. Specifically, says she had same symptoms when she saw me 01/2014 and that abx worked well--symptoms resolved. Was taking Amiodarone at that time and had no adverse effect--is still on Amiodarone--scheduled for DCCV next week.  As well she has history of arrhythmias and is on antiarrhythmic medication. Therefore I think we should avoid azithromycin.  Told her to take the Levaquin as directed. Followup if symptoms do not resolve after completion of this. - levofloxacin (LEVAQUIN) 750 MG tablet; Take 1 tablet (750 mg total) by mouth daily.  Dispense: 7 tablet; Refill: 0  2. subacute stridor I Reviewed Dr. Caren MacadamPickard's last office visit note regarding this-- which is dated 10/14/13. It states the patient had been referred to him by cardiologist secondary to a wheezing/whistling sound on expiration coming from her throat particularly when she would lie down at night. She denied any shortness of breath. Denied any wheezing in the lungs. She did report a dry nonproductive cough that had been going on for several months and began around the same time the stridor began to occur. She denied any apnea. Denied any snoring. Denied any acid reflux. Denied any postnasal drip or sinusitis. She denied any dysphagia or odynophagia. She denied any weight loss or hemoptysis. She had found that the symptoms primarily were occurring at night. She was not sure if this was because of her position when she  is lying down at night or if it is because she is not preoccupied with other things when she is lying there at night. Physical exam of the neck and lungs was normal at that time. At that visit it was felt that the sound that she described was representing a narrowing of her upper airway rather than bronchogenic wheezing. He felt that this could be due to several possible causes: ACE inhibitor, laryngo- esophageal reflux, postnasal drip/allergies, laryngeal mass. He had her discontinue the lisinopril and replace it with Benicar 40 mg daily. If symptoms did not improve then he would start her on empiric PPI. If symptoms continued to persist after that and he would obtain a CT scan of the neck to rule out obstructive mass. Today patient tells me that she did stop the lisinopril and change to  Benicar as directed. At today's office visit she has been taking the PPI for 3 weeks.  She says that the symptoms have continued to persist and have not decreased or changed at all. Wants to go ahead and schedule scan.  I will discuss with Dr. Tanya Nones and then schedule the scan if he is agreeable. Dr. Tanya Nones agreeable for me to go ahead and order scan--order placed.     Signed, [address] [place], [place], [facility] 04/27/2014 9:59 AM

## 2014-05-03 ENCOUNTER — Telehealth: Payer: Self-pay | Admitting: Family Medicine

## 2014-05-03 DIAGNOSIS — B370 Candidal stomatitis: Secondary | ICD-10-CM

## 2014-05-03 MED ORDER — NYSTATIN 100000 UNIT/ML MT SUSP: 5.0000 mL | Freq: Three times a day (TID) | OROMUCOSAL | Status: DC

## 2014-05-03 NOTE — Telephone Encounter (Signed)
RX for Nystatin mouth wash to pharmacy.  Called patient and left message about RX

## 2014-05-03 NOTE — Telephone Encounter (Signed)
Pt states she was put on Levaquin and now has thrush in her mouth and was wondering if we could call her in something for it??

## 2014-05-03 NOTE — Telephone Encounter (Signed)
Yes can send prescription for nystatin suspension Swish and swallow 5 ML's 4 times a day--after meals and each bedtime

## 2014-06-01 ENCOUNTER — Other Ambulatory Visit: Payer: Self-pay | Admitting: Cardiology

## 2014-06-02 ENCOUNTER — Other Ambulatory Visit: Payer: Self-pay | Admitting: Family Medicine

## 2014-06-12 ENCOUNTER — Ambulatory Visit (HOSPITAL_BASED_OUTPATIENT_CLINIC_OR_DEPARTMENT_OTHER): Payer: BC Managed Care – PPO | Attending: Family Medicine

## 2014-06-12 VITALS — Ht 65.0 in | Wt 350.0 lb

## 2014-06-12 DIAGNOSIS — G4733 Obstructive sleep apnea (adult) (pediatric): Secondary | ICD-10-CM | POA: Insufficient documentation

## 2014-06-12 DIAGNOSIS — Z9989 Dependence on other enabling machines and devices: Secondary | ICD-10-CM

## 2014-06-17 DIAGNOSIS — G4733 Obstructive sleep apnea (adult) (pediatric): Secondary | ICD-10-CM

## 2014-06-17 NOTE — Sleep Study (Signed)
   NAME: Heather [name] DATE OF BIRTH:  [birth year] MEDICAL RECORD NUMBER [id number]  LOCATION: [facility]  PHYSICIAN: Nimesh Riolo D  DATE OF STUDY: 06/12/2014  SLEEP STUDY TYPE: Nocturnal Polysomnogram-CPAP titration               REFERRING PHYSICIAN: Donita Brooks, MD  INDICATION FOR STUDY: Hypersomnia with sleep apnea  EPWORTH SLEEPINESS SCORE:   7/24 HEIGHT:  (165.1 cm)  WEIGHT: 350 lb (158.759 kg)    Body mass index is 58.24 kg/(m^2).  NECK SIZE: 15.5 in.  MEDICATIONS: Charted for review  SLEEP ARCHITECTURE: Total sleep time 159.5 minutes with sleep efficiency 43.3%. Stage I was 23.2%, stage II 67.1%, stage III absent, REM 9.7% of total sleep time. Sleep latency 31 minutes, REM latency 163 minutes, awake after sleep onset 170 minutes, arousal index 16.6, bedtime medication: Amitriptyline, cetirizine, diltiazem, Eliquis, guaifenesin, metoprolol. Significant difficulty initiating and maintaining sleep, with sustained sleep onset at around 1:20 AM, frequent waking, and little sleep after 4:30 AM.  RESPIRATORY DATA: CPAP titration protocol. CPAP titrated to 11 CWP, AHI 6.3 per hour. Titration was limited by patient's inability to maintain sleep. She was wearing a small nasal pillows mask.  OXYGEN DATA: Mild residual snoring with mean oxygen saturation 93.4%.  CARDIAC DATA: Sinus rhythm with PACs  MOVEMENT/PARASOMNIA: No significant movement disturbance, bathroom x2  IMPRESSION/ RECOMMENDATION:   1) CPAP was titrated to 11 CWP with occasional residual hypopneas and an AHI of 6.3 per hour. She might do better with a pressure of 12 CWP. Her current home CPAP pressure is not known. Titration was difficult because she had difficulty initiating and maintaining sleep. If this is also her home sleep pattern, she may benefit from management for insomnia. She wore a small ResMed AirFit P10 nasal pillows mask  2) A baseline diagnostic polysomnogram on  03/25/2012 recorded AHI 24.7 per hour with weight 280 pounds done at the [facility] sleep laboratory, which is now closed.  Waymon Budge Diplomate, American Board of Sleep Medicine  ELECTRONICALLY SIGNED ON:  06/17/2014, 10:22 AM [facility] PH: [phone]   FX: [phone] ACCREDITED BY THE AMERICAN ACADEMY OF SLEEP MEDICINE

## 2014-07-04 ENCOUNTER — Encounter: Payer: Self-pay | Admitting: Family Medicine

## 2014-07-04 DIAGNOSIS — G4730 Sleep apnea, unspecified: Secondary | ICD-10-CM | POA: Insufficient documentation

## 2014-07-12 ENCOUNTER — Telehealth: Payer: Self-pay | Admitting: Family Medicine

## 2014-07-12 NOTE — Telephone Encounter (Signed)
Patient is calling about her sleep study results  Had on done on august 31st  [phone]

## 2014-07-12 NOTE — Telephone Encounter (Signed)
MD please advise

## 2014-07-13 NOTE — Telephone Encounter (Signed)
Her cpap should be set to 11 CWP, she was still having apneas 6.3 times per hour even at that level.  They were unable to test further because she could not stay asleep.

## 2014-07-13 NOTE — Telephone Encounter (Signed)
Call placed to patient and patient made aware.   Reports that she is not certain of CPAP settings.   Call placed to [facility] to verify settings.   LMTRC.

## 2014-07-20 NOTE — Telephone Encounter (Signed)
[facility] placed to [facility].  Advised that patient current CPAP pressure is set to 6-8 CWP.   Ok to send order to increase to 11?

## 2014-07-20 NOTE — Telephone Encounter (Signed)
MD made aware and approved order for [facility].   [facility]

## 2014-07-31 ENCOUNTER — Other Ambulatory Visit: Payer: Self-pay | Admitting: Cardiology

## 2014-08-14 ENCOUNTER — Encounter: Payer: Self-pay | Admitting: Family Medicine

## 2014-08-15 ENCOUNTER — Encounter: Payer: Self-pay | Admitting: Family Medicine

## 2014-08-15 ENCOUNTER — Ambulatory Visit (INDEPENDENT_AMBULATORY_CARE_PROVIDER_SITE_OTHER): Payer: BC Managed Care – PPO | Admitting: Family Medicine

## 2014-08-15 ENCOUNTER — Other Ambulatory Visit: Payer: Self-pay | Admitting: *Deleted

## 2014-08-15 ENCOUNTER — Ambulatory Visit
Admission: RE | Admit: 2014-08-15 | Discharge: 2014-08-15 | Disposition: A | Discharge status: Home / Self Care | Payer: BC Managed Care – PPO | Source: Ambulatory Visit | Attending: Family Medicine | Admitting: Family Medicine

## 2014-08-15 VITALS — BP 130/76 | HR 78 | Temp 97.5°F | Resp 16 | Ht 65.0 in | Wt 353.0 lb

## 2014-08-15 DIAGNOSIS — M79661 Pain in right lower leg: Secondary | ICD-10-CM

## 2014-08-15 DIAGNOSIS — M7989 Other specified soft tissue disorders: Secondary | ICD-10-CM

## 2014-08-15 DIAGNOSIS — L309 Dermatitis, unspecified: Secondary | ICD-10-CM

## 2014-08-15 MED ORDER — CLOTRIMAZOLE-BETAMETHASONE 1-0.05 % EX CREA: 1.0000 "application " | TOPICAL_CREAM | Freq: Two times a day (BID) | CUTANEOUS | Status: DC

## 2014-08-15 NOTE — Progress Notes (Signed)
Patient ID: Heather EatonCynthia M Bossler, female   DOB: 08/01/1951, 63 y.o.   MRN: [id number]   Subjective:    Patient ID: Heather EatonCynthia M Meiser, female    DOB: 08/01/1951, 63 y.o.   MRN: [id number]  Patient presents for Edema and Rash patient here with swelling of her right leg for the past 2 months. She states she was seen by her cardiologist advised her to use her Lasix the past 3 days that she had a red itchy rash, one in the back of her calf is states that she has had a hard area for the past 2 months as well as the back of her calf which can be very tender. She did start taking Relafen however stopped because of her eliquis. She also use Lotrisone cream which has helped the rash clear up some    Review Of Systems:  GEN- denies fatigue, fever, weight loss,weakness, recent illness HEENT- denies eye drainage, change in vision, nasal discharge, CVS- denies chest pain, palpitations RESP- denies SOB, cough, wheeze ABD- denies N/V, change in stools, abd pain GU- denies dysuria, hematuria, dribbling, incontinence MSK- denies joint pain, muscle aches, injury Neuro- denies headache, dizziness, syncope, seizure activity       Objective:    BP 130/76 mmHg  Pulse 78  Temp(Src) 97.5 F (36.4 C) (Oral)  Resp 16  Ht 5\' 5"  (1.651 m)  Wt 353 lb (160.12 kg)  BMI 58.74 kg/m2 GEN- NAD, alert and oriented x3 HEENT- PERRL, EOMI, non injected sclera, pink conjunctiva, MMM, oropharynx clear Neck- Supple, no JVD CVS- irregular rhythem, normal rate, no murmur RESP-CTAB EXT- Very large legs 1+ edema bilat, venous stasis changes, minimal induration right calf Skin- erythematous maculpapular rash, +excoriations, NT, no warmth, blanching, no cords palpated Neg Homans Pulses- Radial, DP- 2+        Assessment & Plan:      Problem List Items Addressed This Visit    None    Visit Diagnoses    Leg swelling    -  Primary    US done, no sign of superficial phleblitis based one exam,no DVT, use compression hose,  lasix    Relevant Orders       US Venous Img Lower Unilateral Right (Completed)    Calf pain, right        Relevant Orders       US Venous Img Lower Unilateral Right (Completed)    Dermatitis        unclear cause, does not look like phleblitis and responding to steroids, will have her complete course of lotrisone as this has helped, no antibiotics needed,        Note: This dictation was prepared with Dragon dictation along with smaller phrase technology. Any transcriptional errors that result from this process are unintentional.

## 2014-08-18 ENCOUNTER — Telehealth: Payer: Self-pay | Admitting: Family Medicine

## 2014-08-18 MED ORDER — FUROSEMIDE 20 MG PO TABS: 20.0000 mg | ORAL_TABLET | Freq: Every day | ORAL | Status: DC | PRN

## 2014-08-18 NOTE — Telephone Encounter (Signed)
Patient is calling to ask if she can get prescription for lasix if possible, she says another doctor prescribes that for her but dr Jeanice Limdurham said we would refill it  [phone] [facility]

## 2014-08-18 NOTE — Telephone Encounter (Signed)
Refill appropriate and filled per protocol. 

## 2014-09-01 ENCOUNTER — Other Ambulatory Visit: Payer: Self-pay | Admitting: Internal Medicine

## 2014-09-21 ENCOUNTER — Encounter (HOSPITAL_COMMUNITY): Payer: Self-pay | Admitting: Internal Medicine

## 2014-09-30 ENCOUNTER — Other Ambulatory Visit: Payer: Self-pay | Admitting: Family Medicine

## 2014-09-30 ENCOUNTER — Other Ambulatory Visit: Payer: Self-pay | Admitting: Internal Medicine

## 2014-10-12 ENCOUNTER — Telehealth: Payer: Self-pay | Admitting: *Deleted

## 2014-10-12 DIAGNOSIS — B9689 Other specified bacterial agents as the cause of diseases classified elsewhere: Secondary | ICD-10-CM

## 2014-10-12 DIAGNOSIS — J988 Other specified respiratory disorders: Principal | ICD-10-CM

## 2014-10-12 MED ORDER — HYDROCOD POLST-CHLORPHEN POLST 10-8 MG/5ML PO LQCR
5.0000 mL | Freq: Two times a day (BID) | ORAL | Status: DC | PRN
Start: 2014-10-12 — End: 2015-01-11

## 2014-10-12 NOTE — Telephone Encounter (Signed)
Received call from patient.   Reports that she has the "crud" that is going around. Patient noted to sound hoarse and to have non-productive cough while on phone with Clinical research associatewriter.   States that she has had cough for a few weeks. She has tried to manage cough ineffectively with Mucinex.   Reports that she usually gets cough every year, and is usually effectively treated with Tussonex. Requested prescription for cough syrup.   MD please advise.

## 2014-10-12 NOTE — Telephone Encounter (Signed)
Prescription printed and patient made aware to come to office to pick up.  

## 2014-10-12 NOTE — Telephone Encounter (Signed)
Ok with tussionex 5 mL poq12 hrs prn cough

## 2014-10-14 ENCOUNTER — Other Ambulatory Visit: Payer: Self-pay | Admitting: Internal Medicine

## 2014-10-17 ENCOUNTER — Encounter: Payer: Self-pay | Admitting: Family Medicine

## 2014-10-17 ENCOUNTER — Ambulatory Visit (INDEPENDENT_AMBULATORY_CARE_PROVIDER_SITE_OTHER): Payer: BC Managed Care – PPO | Admitting: Family Medicine

## 2014-10-17 VITALS — BP 130/70 | HR 64 | Temp 98.0°F | Resp 20 | Wt 358.0 lb

## 2014-10-17 DIAGNOSIS — J208 Acute bronchitis due to other specified organisms: Secondary | ICD-10-CM

## 2014-10-17 MED ORDER — DOXYCYCLINE HYCLATE 100 MG PO TABS: 100.0000 mg | ORAL_TABLET | Freq: Two times a day (BID) | ORAL | Status: DC

## 2014-10-17 NOTE — Progress Notes (Signed)
Subjective:    Patient ID: Heather EatonCynthia M Gaunt, female    DOB: [birth year], 64 y.o.   MRN: [id number]  HPI Patient reports a 7 day history of cough productive of yellow and white sputum, chest congestion, sinus congestion, postnasal drip, and head congestion. She denies any fevers or chills. She denies any pleurisy. She denies any shortness of breath. She has been trying Mucinex and Tessalon neck without relief. She also reports wheezing at home. Past Medical History  Diagnosis Date  . HTN (hypertension)   . Obesity   . Carpal tunnel syndrome   . Anemia     "onc;e; had to take iron for awhile"  . Degenerative disk disease   . History of blood transfusion     "w/both hip replacements"  . IBS (irritable bowel syndrome)   . Morton's neuroma     right foot  . Atrial fibrillation     Began in 2004.  [facility] and [facility].  She is off coumadin  now with no noted recurrent atrial fibrillation since 2007. til 04/01/12; s/p initiation of Rhythmol with DCCV June 2013  . Hypothyroidism     no meds currently  . Sleep apnea 04/01/12    "dx'd just last week"  . Sleep apnea     uses CPAP, 11 CWP with residual AHI 6.3  . Interstitial cystitis   . GERD (gastroesophageal reflux disease)     resolved after lap band  . Shortness of breath    Past Surgical History  Procedure Laterality Date  . Back surgery    . Carpal tunnel release  1990's    bilaterally  . Lasik    . Cardiac catheterization    . Laparoscopic gastric banding  2009  . Cardiac electrophysiology mapping and ablation  ~ 2005 and 2007    "[facility]nd time; [facility]"  . Tonsillectomy and adenoidectomy  1957  . Vaginal hysterectomy  2000  . Tubal ligation  1980  . Dilation and curettage of uterus  2000  . Total hip arthroplasty  ~2009; 2010    right; left  . Joint replacement      bilateral hips  . Posterior fusion lumbar spine  2000's    "nerve problems"  . Posterior fusion lumbar spine   2000's  . Cardioversion  04/02/2012    Procedure: CARDIOVERSION;  Surgeon: Laurey Moralealton S McLean, MD;  [facility];  Service: Cardiovascular;  Laterality: N/A;  . Cardioversion  06/16/2012    Procedure: CARDIOVERSION;  Surgeon: Vesta MixerPhilip J Nahser, MD;  [facility];  Service: Cardiovascular;  Laterality: N/A;  amanda/ebp/Beverly( or scheduling)  . Anterior and posterior repair  10/20/2012    Procedure: ANTERIOR (CYSTOCELE) AND POSTERIOR REPAIR (RECTOCELE);  Surgeon: Esmeralda ArthurSandra A Rivard, MD;  Location: [facility] ORS;  Service: Gynecology;  Laterality: N/A;  2 hours  . Cardioversion  10/29/2012    Procedure: CARDIOVERSION;  Surgeon: Laurey Moralealton S McLean, MD;  [facility];  Service: Cardiovascular;  Laterality: N/A;  . Tee without cardioversion N/A 12/29/2012    Procedure: TRANSESOPHAGEAL ECHOCARDIOGRAM (TEE);  Surgeon: Peter M SwazilandJordan, MD;  [facility];  Service: Cardiovascular;  Laterality: N/A;  . Cardioversion N/A 03/30/2013    Procedure: CARDIOVERSION;  Surgeon: Laurey Moralealton S McLean, MD;  [facility];  Service: Cardiovascular;  Laterality: N/A;  . Cardioversion N/A 12/23/2013    Procedure: CARDIOVERSION;  Surgeon: Lars MassonKatarina H Nelson, MD;  [facility];  Service: Cardiovascular;  Laterality:  N/A;  9:27 Propofol , IV   150 joules synched shock by Dr. Delton See @ 150 joules...SR   post 12 lead ordered.   . Atrial fibrillation ablation N/A 12/30/2012    Procedure: ATRIAL FIBRILLATION ABLATION;  Surgeon: Hillis Range, MD;  Location: [facility] CATH LAB;  Service: Cardiovascular;  Laterality: N/A;   Current Outpatient Prescriptions on File Prior to Visit  Medication Sig Dispense Refill  . amiodarone (PACERONE) 200 MG tablet Take 1 tablet (200 mg total) by mouth daily. 90 tablet 0  . amitriptyline (ELAVIL) 10 MG tablet TAKE 1 TABLET BY MOUTH AT BEDTIME. 30 tablet 5  . cetirizine (ZYRTEC) 10 MG tablet Take 10 mg by mouth daily.    . chlorpheniramine-HYDROcodone (TUSSIONEX PENNKINETIC ER)  10-8 MG/5ML LQCR Take 5 mLs by mouth every 12 (twelve) hours as needed for cough. 115 mL 0  . Cholecalciferol (VITAMIN D) 2000 UNITS tablet Take 2,000 Units by mouth daily.    Marland Kitchen diltiazem (CARDIZEM CD) 120 MG 24 hr capsule take 1 capsule BID    . ELIQUIS 5 MG TABS tablet TAKE 1 TABLET BY MOUTH TWICE A DAY....NEED OFFICE VISIT BEFORE ANY MORE REFILLS 30 tablet 0  . furosemide (LASIX) 20 MG tablet Take 1 tablet (20 mg total) by mouth daily as needed. 30 tablet 3  . guaiFENesin (MUCINEX) 600 MG 12 hr tablet Take 600 mg by mouth 2 (two) times daily as needed. For allergies.    Marland Kitchen losartan (COZAAR) 100 MG tablet TAKE 1 TABLET BY MOUTH EVERY DAY 30 tablet 0  . metoprolol tartrate (LOPRESSOR) 25 MG tablet   11  . tolterodine (DETROL) 2 MG tablet Take 2 mg by mouth daily.      No current facility-administered medications on file prior to visit.   Allergies  Allergen Reactions  . Cephalexin Hives and Other (See Comments)    "throat started closing up"  . Rocephin [Ceftriaxone Sodium In Dextrose] Anaphylaxis  . Penicillins Rash  . Flecainide Acetate Other (See Comments)    "couldn't take it"  . Moxifloxacin     Does not remember the reaction  . Sulfonamide Derivatives     Does not remember reaction ; "was so young when I had reaction to it"   History   Social History  . Marital Status: Married    Spouse Name: N/A    Number of Children: N/A  . Years of Education: N/A   Occupational History  . Not on file.   Social History Main Topics  . Smoking status: Never Smoker   . Smokeless tobacco: Never Used  . Alcohol Use: No  . Drug Use: No  . Sexual Activity: Yes     Comment: LAVH   Other Topics Concern  . Not on file   Social History Narrative   Works in data entry in [employer]   Married, lives in [place]   Tobacco Use - No.    Alcohol Use - no      Review of Systems  All other systems reviewed and are negative.      Objective:   Physical Exam  Constitutional: She  appears well-developed and well-nourished.  HENT:  Right Ear: External ear normal.  Left Ear: External ear normal.  Nose: Nose normal.  Mouth/Throat: Oropharynx is clear and moist. No oropharyngeal exudate.  Eyes: Conjunctivae are normal.  Neck: Neck supple.  Cardiovascular: Normal rate, regular rhythm and normal heart sounds.   Pulmonary/Chest: Effort normal and breath sounds normal. No respiratory distress.  She has no wheezes.  Abdominal: Soft. Bowel sounds are normal. She exhibits no distension. There is no tenderness. There is no rebound and no guarding.  Lymphadenopathy:    She has no cervical adenopathy.  Vitals reviewed.         Assessment & Plan:  Acute bronchitis due to other specified organisms - Plan: doxycycline (VIBRA-TABS) 100 MG tablet  She has bronchitis. I explained to the patient I believe this is a virus. I recommended supportive care only with Mucinex and test the next for additional 3-4 days. If symptoms are not improved after 10 days or she develops fever or cough productive of purulent sputum I want her to start doxycycline 100 mg by mouth twice a day for 10 days.

## 2014-10-23 ENCOUNTER — Other Ambulatory Visit: Payer: Self-pay | Admitting: Family Medicine

## 2014-10-24 NOTE — Telephone Encounter (Signed)
Refill appropriate and filled per protocol. 

## 2014-10-25 ENCOUNTER — Telehealth: Payer: Self-pay | Admitting: Family Medicine

## 2014-10-25 NOTE — Telephone Encounter (Signed)
Patient is calling to see if we can send in rx for tussinex if possible for her cough [facility]  [phone]

## 2014-10-26 NOTE — Telephone Encounter (Signed)
?   OK to Refill  

## 2014-10-26 NOTE — Telephone Encounter (Signed)
ok 

## 2014-10-27 MED ORDER — GUAIFENESIN-CODEINE 100-10 MG/5ML PO SOLN: 5.0000 mL | Freq: Four times a day (QID) | ORAL | Status: DC | PRN

## 2014-10-27 NOTE — Telephone Encounter (Signed)
Pt would like to try Robitussin AC so that she does not have to come pick up an RX and per WTP ok to call in.  Med called to pharm and pt aware.

## 2014-10-27 NOTE — Telephone Encounter (Signed)
LMTRC

## 2014-10-28 ENCOUNTER — Other Ambulatory Visit: Payer: Self-pay | Admitting: Internal Medicine

## 2014-10-30 NOTE — Telephone Encounter (Signed)
Please advise on refill. Patient was supposed to follow up in July 2015, and she still has not scheduled an appointment. Thanks, MI

## 2014-11-28 NOTE — Telephone Encounter (Signed)
Your physician recommends that you continue on your current medications as directed. Please refer to the Current Medication list given to you today.  You have been referred to Dr. Clydie Braun[place]id [place] [facility] [place] [facility] [place].   Your physician recommends that you schedule a follow-up appointment in: 3 months with Dr. Johney FrameAllred.     I would call the patient and see if Dr Sampson GoonFitzgerald has been filling medication

## 2014-12-08 ENCOUNTER — Other Ambulatory Visit: Payer: Self-pay | Admitting: Cardiology

## 2014-12-08 ENCOUNTER — Other Ambulatory Visit: Payer: Self-pay | Admitting: Internal Medicine

## 2015-01-11 ENCOUNTER — Ambulatory Visit (INDEPENDENT_AMBULATORY_CARE_PROVIDER_SITE_OTHER): Payer: BC Managed Care – PPO | Admitting: Family Medicine

## 2015-01-11 ENCOUNTER — Encounter: Payer: Self-pay | Admitting: Family Medicine

## 2015-01-11 VITALS — BP 126/72 | HR 60 | Temp 97.5°F | Resp 16 | Ht 65.5 in | Wt 353.0 lb

## 2015-01-11 DIAGNOSIS — J4521 Mild intermittent asthma with (acute) exacerbation: Secondary | ICD-10-CM

## 2015-01-11 MED ORDER — ALBUTEROL SULFATE HFA 108 (90 BASE) MCG/ACT IN AERS: 2.0000 | INHALATION_SPRAY | Freq: Four times a day (QID) | RESPIRATORY_TRACT | Status: DC | PRN

## 2015-01-11 MED ORDER — PREDNISONE 20 MG PO TABS
ORAL_TABLET | ORAL | Status: DC
Start: 2015-01-11 — End: 2015-01-29

## 2015-01-11 NOTE — Progress Notes (Signed)
Subjective:    Patient ID: Heather EatonCynthia M [name], female    DOB: [birth year], 64 y.o.   MRN: [id number]  HPI   Patient reports that as the season change in the pollen began to accumulate one week ago, she started developing shortness of breath and wheezing and coughing. Recently over the last few days her shortness of breath is dramatically worsened. She reports feeling short of breath, tightness in her airways, wheezing, coughing, heaviness in her chest. Cough is nonproductive. She denies any fevers or chills. She denies any chest pain. She denies any pleurisy. Past Medical History  Diagnosis Date  . HTN (hypertension)   . Obesity   . Carpal tunnel syndrome   . Anemia     "onc;e; had to take iron for awhile"  . Degenerative disk disease   . History of blood transfusion     "w/both hip replacements"  . IBS (irritable bowel syndrome)   . Morton's neuroma     right foot  . Atrial fibrillation     Began in 2004.  [facility] in 2005 and 2007.  She is off coumadin  now with no noted recurrent atrial fibrillation since 2007. til 04/01/12; s/p initiation of Rhythmol with DCCV June 2013  . Hypothyroidism     no meds currently  . Sleep apnea 04/01/12    "dx'd just last week"  . Sleep apnea     uses CPAP, 11 CWP with residual AHI 6.3  . Interstitial cystitis   . GERD (gastroesophageal reflux disease)     resolved after lap band  . Shortness of breath    Past Surgical History  Procedure Laterality Date  . Back surgery    . Carpal tunnel release  1990's    bilaterally  . Lasik    . Cardiac catheterization    . Laparoscopic gastric banding  2009  . Cardiac electrophysiology mapping and ablation  ~ 2005 and 2007    "did more 2nd time; both at [facility]"  . Tonsillectomy and adenoidectomy  1957  . Vaginal hysterectomy  2000  . Tubal ligation  1980  . Dilation and curettage of uterus  2000  . Total hip arthroplasty  ~2009; 2010    right; left  . Joint replacement       bilateral hips  . Posterior fusion lumbar spine  2000's    "nerve problems"  . Posterior fusion lumbar spine  2000's  . Cardioversion  04/02/2012    Procedure: CARDIOVERSION;  Surgeon: Laurey Moralealton S McLean, MD;  [facility];  Service: Cardiovascular;  Laterality: N/A;  . Cardioversion  06/16/2012    Procedure: CARDIOVERSION;  Surgeon: Vesta MixerPhilip J Nahser, MD;  [facility];  Service: Cardiovascular;  Laterality: N/A;  amanda/ebp/Beverly( or scheduling)  . Anterior and posterior repair  10/20/2012    Procedure: ANTERIOR (CYSTOCELE) AND POSTERIOR REPAIR (RECTOCELE);  Surgeon: Esmeralda ArthurSandra A Rivard, MD;  Location: [facility] ORS;  Service: Gynecology;  Laterality: N/A;  2 hours  . Cardioversion  10/29/2012    Procedure: CARDIOVERSION;  Surgeon: Laurey Moralealton S McLean, MD;  [facility];  Service: Cardiovascular;  Laterality: N/A;  . Tee without cardioversion N/A 12/29/2012    Procedure: TRANSESOPHAGEAL ECHOCARDIOGRAM (TEE);  Surgeon: Peter M SwazilandJordan, MD;  [facility];  Service: Cardiovascular;  Laterality: N/A;  . Cardioversion N/A 03/30/2013    Procedure: CARDIOVERSION;  Surgeon: Laurey Moralealton S McLean, MD;  [facility];  Service: Cardiovascular;  Laterality: N/A;  . Cardioversion N/A 12/23/2013  Procedure: CARDIOVERSION;  Surgeon: Lars Masson, MD;  Location: [facility] ENDOSCOPY;  Service: Cardiovascular;  Laterality: N/A;  9:27 Propofol , IV   150 joules synched shock by Dr. Delton See @ 150 joules...SR   post 12 lead ordered.   . Atrial fibrillation ablation N/A 12/30/2012    Procedure: ATRIAL FIBRILLATION ABLATION;  Surgeon: Hillis Range, MD;  Location: [facility] CATH LAB;  Service: Cardiovascular;  Laterality: N/A;   Current Outpatient Prescriptions on File Prior to Visit  Medication Sig Dispense Refill  . amiodarone (PACERONE) 200 MG tablet Take 1 tablet (200 mg total) by mouth daily. 90 tablet 0  . amitriptyline (ELAVIL) 10 MG tablet TAKE 1 TABLET BY MOUTH AT BEDTIME. 30 tablet 5  .  cetirizine (ZYRTEC) 10 MG tablet Take 10 mg by mouth daily.    . Cholecalciferol (VITAMIN D) 2000 UNITS tablet Take 2,000 Units by mouth daily.    Marland Kitchen diltiazem (CARDIZEM CD) 120 MG 24 hr capsule take 1 capsule BID    . ELIQUIS 5 MG TABS tablet TAKE 1 TABLET BY MOUTH TWICE A DAY....NEED OFFICE VISIT BEFORE ANY MORE REFILLS 60 tablet 6  . furosemide (LASIX) 20 MG tablet Take 1 tablet (20 mg total) by mouth daily as needed. (Patient taking differently: Take 40 mg by mouth daily as needed. ) 30 tablet 3  . guaiFENesin (MUCINEX) 600 MG 12 hr tablet Take 600 mg by mouth 2 (two) times daily as needed. For allergies.    Marland Kitchen losartan (COZAAR) 100 MG tablet TAKE 1 TABLET BY MOUTH EVERY DAY 30 tablet 5  . metoprolol tartrate (LOPRESSOR) 25 MG tablet   11  . guaiFENesin-codeine 100-10 MG/5ML syrup Take 5 mLs by mouth every 6 (six) hours as needed for cough. 120 mL 0   No current facility-administered medications on file prior to visit.   Allergies  Allergen Reactions  . Cephalexin Hives and Other (See Comments)    "throat started closing up"  . Rocephin [Ceftriaxone Sodium In Dextrose] Anaphylaxis  . Penicillins Rash  . Flecainide Acetate Other (See Comments)    "couldn't take it"  . Moxifloxacin     Does not remember the reaction  . Sulfonamide Derivatives     Does not remember reaction ; "was so young when I had reaction to it"   History   Social History  . Marital Status: Married    Spouse Name: N/A  . Number of Children: N/A  . Years of Education: N/A   Occupational History  . Not on file.   Social History Main Topics  . Smoking status: Never Smoker   . Smokeless tobacco: Never Used  . Alcohol Use: No  . Drug Use: No  . Sexual Activity: Yes     Comment: LAVH   Other Topics Concern  . Not on file   Social History Narrative   Works in data entry in [employer]   Married, lives in [place]   Tobacco Use - No.    Alcohol Use - no      Review of Systems  All other systems  reviewed and are negative.      Objective:   Physical Exam  Constitutional: She appears well-developed and well-nourished.  HENT:  Right Ear: External ear normal.  Left Ear: External ear normal.  Nose: Mucosal edema and rhinorrhea present.  Mouth/Throat: Oropharynx is clear and moist. No oropharyngeal exudate.  Eyes: Conjunctivae are normal.  Neck: Neck supple.  Cardiovascular: Normal rate, regular rhythm and normal heart sounds.  Pulmonary/Chest: Effort normal. No respiratory distress. She has wheezes. She has no rales. She exhibits no tenderness.  Lymphadenopathy:    She has no cervical adenopathy.  Vitals reviewed.         Assessment & Plan:  Asthma with acute exacerbation, mild intermittent - Plan: albuterol (PROVENTIL HFA;VENTOLIN HFA) 108 (90 BASE) MCG/ACT inhaler, predniSONE (DELTASONE) 20 MG tablet  Patient has seasonal allergies which are exacerbating her asthma. Use albuterol 2 puffs inhaled every 6 hours as needed. Continue Zyrtec. Supplement with nasal neck 2 sprays each nostril daily. If symptoms worsen, begin prednisone taper pack as prescribed.

## 2015-01-19 ENCOUNTER — Telehealth: Payer: Self-pay | Admitting: Family Medicine

## 2015-01-19 MED ORDER — HYOSCYAMINE SULFATE 0.125 MG PO TABS: 0.1250 mg | ORAL_TABLET | ORAL | Status: DC | PRN

## 2015-01-19 NOTE — Telephone Encounter (Signed)
?   OK to Refill as I do not see where we have given this since Epic

## 2015-01-19 NOTE — Telephone Encounter (Signed)
Med sent to pharm 

## 2015-01-19 NOTE — Telephone Encounter (Signed)
[phone] PT is wanting hyoscyamine su .125mg  (for her IBS)  [facility] [address]

## 2015-01-19 NOTE — Telephone Encounter (Signed)
Ok with that.

## 2015-01-29 ENCOUNTER — Encounter: Payer: Self-pay | Admitting: Family Medicine

## 2015-01-29 ENCOUNTER — Ambulatory Visit (INDEPENDENT_AMBULATORY_CARE_PROVIDER_SITE_OTHER): Payer: BC Managed Care – PPO | Admitting: Family Medicine

## 2015-01-29 VITALS — BP 136/70 | HR 68 | Temp 97.9°F | Resp 18 | Ht 65.5 in | Wt 356.0 lb

## 2015-01-29 DIAGNOSIS — G4730 Sleep apnea, unspecified: Secondary | ICD-10-CM

## 2015-01-29 DIAGNOSIS — J4530 Mild persistent asthma, uncomplicated: Secondary | ICD-10-CM | POA: Diagnosis not present

## 2015-01-29 NOTE — Patient Instructions (Signed)
Get the chest xray Take 2 puffs of pulmicort twice a day  You will need pulmonary function testing done as well, we will set this up F/U pending results

## 2015-01-29 NOTE — Assessment & Plan Note (Signed)
I am sure this is also contributing to the SOB in general

## 2015-01-29 NOTE — Progress Notes (Signed)
Patient ID: Heather EatonCynthia M Gessel, female   DOB: 12/14/1950, 64 y.o.   MRN: [id number]   Subjective:    Patient ID: Heather [name], female    DOB: 12/14/1950, 64 y.o.   MRN: [id number]  Patient presents for Asthma  Pt here with persistent wheezing and SOB throughout the day and at night.. She was seen back in January 2016 at that time diagnosed with bronchitis she was treated appropriately and her symptoms improved however she continued to have episodes of wheezing and shortness of breath since that time. She was seen by her primary care provider about 3 weeks ago at that time she was prescribed albuterol inhaler she is also inserted and a prednisone Dosepak. His diagnosis at that time was allergies likely worsened by the pollen. She had PFT done in March of 2015 which showed both restrictive and moderate obstructive lung disease- this was due to initiation of amiodarone by her cardiologist.    She wears CPAP at night She states prednisone caused her to have water retention, therefore she had to take more diuretic and there was no change in her breathing, the albuterol also did not help very much    Review Of Systems:  GEN- denies fatigue, fever, weight loss,weakness, recent illness HEENT- denies eye drainage, change in vision, nasal discharge, CVS- denies chest pain, palpitations RESP- + SOB, +cough, +wheeze ABD- denies N/V, change in stools, abd pain GU- denies dysuria, hematuria, dribbling, incontinence MSK- denies joint pain, muscle aches, injury Neuro- denies headache, dizziness, syncope, seizure activity       Objective:    BP 136/70 mmHg  Pulse 68  Temp(Src) 97.9 F (36.6 C) (Oral)  Resp 18  Ht 5' 5.5" (1.664 m)  Wt 356 lb (161.481 kg)  BMI 58.32 kg/m2  SpO2 97% GEN- NAD, alert and oriented x3 HEENT- PERRL, EOMI, non injected sclera, pink conjunctiva, MMM, oropharynx clear Neck- Supple, no JVD CVS- RRR, no murmur RESP-CTAB, decreased at bases, no wheeze, normal WOB EXT-  chronic non pitting edema Pulses- Radial  2+  Peak Flow 250, 275, 300, 300        Assessment & Plan:      Problem List Items Addressed This Visit    None    Visit Diagnoses    Asthma with bronchitis, mild persistent, uncomplicated    -  Primary    Symptoms of asthma and chronic bronchitis- PFT showed improvement with bronchodilator but pt did not, will get CXR, given pulmicort 2 puffs BID to try, may need pulmonary referral    Relevant Medications    Budesonide 90 MCG/ACT inhaler       Note: This dictation was prepared with Dragon dictation along with smaller phrase technology. Any transcriptional errors that result from this process are unintentional.

## 2015-01-30 ENCOUNTER — Ambulatory Visit
Admission: RE | Admit: 2015-01-30 | Discharge: 2015-01-30 | Disposition: A | Discharge status: Home / Self Care | Payer: BC Managed Care – PPO | Source: Ambulatory Visit | Attending: Family Medicine | Admitting: Family Medicine

## 2015-01-30 ENCOUNTER — Other Ambulatory Visit: Payer: Self-pay | Admitting: Family Medicine

## 2015-01-30 DIAGNOSIS — J4520 Mild intermittent asthma, uncomplicated: Secondary | ICD-10-CM

## 2015-02-28 ENCOUNTER — Ambulatory Visit (INDEPENDENT_AMBULATORY_CARE_PROVIDER_SITE_OTHER): Payer: BC Managed Care – PPO | Admitting: Family Medicine

## 2015-02-28 ENCOUNTER — Encounter: Payer: Self-pay | Admitting: Family Medicine

## 2015-02-28 VITALS — BP 132/70 | HR 60 | Temp 97.7°F | Resp 16 | Ht 65.5 in | Wt 358.0 lb

## 2015-02-28 DIAGNOSIS — R0602 Shortness of breath: Secondary | ICD-10-CM

## 2015-02-28 DIAGNOSIS — I10 Essential (primary) hypertension: Secondary | ICD-10-CM

## 2015-02-28 DIAGNOSIS — E079 Disorder of thyroid, unspecified: Secondary | ICD-10-CM

## 2015-02-28 NOTE — Assessment & Plan Note (Signed)
Blood pressure is well controlled 

## 2015-02-28 NOTE — Assessment & Plan Note (Signed)
Check thyroid levels with fasting labs

## 2015-02-28 NOTE — Progress Notes (Signed)
Patient ID: Heather EatonCynthia M [name], female   DOB: 08-11-1951, 64 y.o.   MRN: [id number]   Subjective:    Patient ID: Heather EatonCynthia M [name], female    DOB: 08-11-1951, 64 y.o.   MRN: [id number]  Patient presents for Breathing Issues  here to follow-up shortness of breath. Shortness of breath is chronic. She was treated for bronchitis as well as asthma with minimal improvement. I did get a chest x-ray a few weeks ago which showed some interstitial edema and concern for CHF I increased her Lasix today she had some mild improvement initially but no significant change. She was seen by her cardiologist Dr. Sampson GoonFitzgerald about 3 weeks ago she also advised him of the shortness of breath. She states that she had an echocardiogram done but she does not know the results of this. Of note also put her on Pulmicort she is only noticed minimal improvement with this as well. She does continue to wheeze and cough at times. She can only walk very short distances without getting short of breath. He does understand that her weight is contributing    Review Of Systems:  GEN- denies fatigue, fever, weight loss,weakness, recent illness HEENT- denies eye drainage, change in vision, nasal discharge, CVS- denies chest pain, palpitations RESP- + SOB, cough,+ wheeze ABD- denies N/V, change in stools, abd pain GU- denies dysuria, hematuria, dribbling, incontinence MSK- denies joint pain, muscle aches, injury Neuro- denies headache, dizziness, syncope, seizure activity       Objective:    BP 132/70 mmHg  Pulse 60  Temp(Src) 97.7 F (36.5 C) (Oral)  Resp 16  Ht 5' 5.5" (1.664 m)  Wt 358 lb (162.388 kg)  BMI 58.65 kg/m2  SpO2 92% GEN- NAD, alert and oriented x3,obese HEENT- PERRL, EOMI, non injected sclera, pink conjunctiva, MMM, oropharynx clear Neck- Supple, no JVD CVS- RRR, no murmur RESP-CTAB, decreased at bases, no wheeze, normal WOB EXT- chronic non pitting edema Pulses- Radial  2+       Assessment & Plan:       Problem List Items Addressed This Visit    None      Note: This dictation was prepared with Dragon dictation along with smaller phrase technology. Any transcriptional errors that result from this process are unintentional.

## 2015-02-28 NOTE — Assessment & Plan Note (Signed)
Chronic shortness of breath. At this time the differentials are either congestive heart failure which is a new diagnosis for patient versus restrictive/obstructive lung disease. I'm going to follow with her cardiologist to get the results of echocardiogram. Her weight is currently stable. No sign of any decompensation from heart failure. She will continue her diuretics at the current dose. I'm also getting dull headache continue the inhaled corticosteroids. If cardiology does not feel like this is heart related and I'm going to refer her to a pulmonologist for further testing. We also discussed that her habitus is also contributing to her shortness of breath. She is contemplating having gastric sleeve done of note she has had gastric lap band in the past

## 2015-02-28 NOTE — Patient Instructions (Signed)
I will discuss with Dr. Sampson GoonFitzgerald about your breathing Continue pulmicort  Return for fasting labs  I will get 2D Echo report

## 2015-03-02 ENCOUNTER — Telehealth: Payer: Self-pay | Admitting: Family Medicine

## 2015-03-02 DIAGNOSIS — I272 Pulmonary hypertension, unspecified: Secondary | ICD-10-CM

## 2015-03-02 DIAGNOSIS — J449 Chronic obstructive pulmonary disease, unspecified: Secondary | ICD-10-CM

## 2015-03-02 DIAGNOSIS — R0602 Shortness of breath: Secondary | ICD-10-CM

## 2015-03-02 NOTE — Telephone Encounter (Signed)
Call pt, I spoke with her cardiologist, she needs to see a lung doctor, she has pulmonary HTN which is a lung disease that can cause SOB, she does not have heart failure. We will set up appt with pulmonary

## 2015-03-05 ENCOUNTER — Other Ambulatory Visit: Payer: Self-pay | Admitting: Family Medicine

## 2015-03-05 ENCOUNTER — Other Ambulatory Visit: Payer: BC Managed Care – PPO

## 2015-03-05 DIAGNOSIS — R0602 Shortness of breath: Secondary | ICD-10-CM

## 2015-03-05 DIAGNOSIS — E079 Disorder of thyroid, unspecified: Secondary | ICD-10-CM

## 2015-03-05 DIAGNOSIS — I10 Essential (primary) hypertension: Secondary | ICD-10-CM

## 2015-03-05 LAB — CBC WITH DIFFERENTIAL/PLATELET
BASOS PCT: 1 % (ref 0–1)
Basophils Absolute: 0.1 10*3/uL (ref 0.0–0.1)
Eosinophils Absolute: 0.3 10*3/uL (ref 0.0–0.7)
Eosinophils Relative: 5 % (ref 0–5)
HEMATOCRIT: 35 % — AB (ref 36.0–46.0)
Hemoglobin: 11.2 g/dL — ABNORMAL LOW (ref 12.0–15.0)
LYMPHS ABS: 1 10*3/uL (ref 0.7–4.0)
Lymphocytes Relative: 17 % (ref 12–46)
MCH: 27.6 pg (ref 26.0–34.0)
MCHC: 32 g/dL (ref 30.0–36.0)
MCV: 86.2 fL (ref 78.0–100.0)
MPV: 10.7 fL (ref 8.6–12.4)
Monocytes Absolute: 0.5 10*3/uL (ref 0.1–1.0)
Monocytes Relative: 9 % (ref 3–12)
NEUTROS PCT: 68 % (ref 43–77)
Neutro Abs: 4.1 10*3/uL (ref 1.7–7.7)
Platelets: 236 10*3/uL (ref 150–400)
RBC: 4.06 MIL/uL (ref 3.87–5.11)
RDW: 14.7 % (ref 11.5–15.5)
WBC: 6 10*3/uL (ref 4.0–10.5)

## 2015-03-05 LAB — LIPID PANEL
CHOL/HDL RATIO: 3.3 ratio
CHOLESTEROL: 210 mg/dL — AB (ref 0–200)
HDL: 63 mg/dL (ref >=46)
LDL Cholesterol: 129 mg/dL — ABNORMAL HIGH (ref 0–99)
TRIGLYCERIDES: 92 mg/dL (ref <150)
VLDL: 18 mg/dL (ref 0–40)

## 2015-03-05 LAB — COMPREHENSIVE METABOLIC PANEL
ALBUMIN: 4 g/dL (ref 3.5–5.2)
ALT: 19 U/L (ref 0–35)
AST: 16 U/L (ref 0–37)
Alkaline Phosphatase: 82 U/L (ref 39–117)
BILIRUBIN TOTAL: 0.5 mg/dL (ref 0.2–1.2)
BUN: 32 mg/dL — AB (ref 6–23)
CALCIUM: 9 mg/dL (ref 8.4–10.5)
CO2: 26 meq/L (ref 19–32)
Chloride: 105 mEq/L (ref 96–112)
Creat: 1.21 mg/dL — ABNORMAL HIGH (ref 0.50–1.10)
Glucose, Bld: 107 mg/dL — ABNORMAL HIGH (ref 70–99)
Potassium: 4.4 mEq/L (ref 3.5–5.3)
Sodium: 142 mEq/L (ref 135–145)
Total Protein: 6.4 g/dL (ref 6.0–8.3)

## 2015-03-05 LAB — TSH: TSH: 9.709 u[IU]/mL — AB (ref 0.350–4.500)

## 2015-03-05 NOTE — Telephone Encounter (Signed)
Pt aware of results 

## 2015-03-06 LAB — T4, FREE: Free T4: 1.02 ng/dL (ref 0.80–1.80)

## 2015-03-06 LAB — HEMOGLOBIN A1C
HEMOGLOBIN A1C: 6.1 % — AB (ref <5.7)
MEAN PLASMA GLUCOSE: 128 mg/dL — AB (ref <117)

## 2015-03-06 LAB — T3, FREE: T3, Free: 2.4 pg/mL (ref 2.3–4.2)

## 2015-03-06 LAB — BRAIN NATRIURETIC PEPTIDE: Brain Natriuretic Peptide: 145.5 pg/mL — ABNORMAL HIGH (ref 0.0–100.0)

## 2015-03-07 ENCOUNTER — Other Ambulatory Visit: Payer: Self-pay | Admitting: *Deleted

## 2015-03-07 DIAGNOSIS — E039 Hypothyroidism, unspecified: Secondary | ICD-10-CM

## 2015-03-22 ENCOUNTER — Ambulatory Visit (INDEPENDENT_AMBULATORY_CARE_PROVIDER_SITE_OTHER): Payer: BC Managed Care – PPO | Admitting: Pulmonary Disease

## 2015-03-22 ENCOUNTER — Encounter: Payer: Self-pay | Admitting: Pulmonary Disease

## 2015-03-22 VITALS — BP 142/86 | HR 97 | Temp 96.9°F | Ht 65.0 in | Wt 356.0 lb

## 2015-03-22 DIAGNOSIS — G4733 Obstructive sleep apnea (adult) (pediatric): Secondary | ICD-10-CM

## 2015-03-22 DIAGNOSIS — I270 Primary pulmonary hypertension: Secondary | ICD-10-CM

## 2015-03-22 DIAGNOSIS — I272 Pulmonary hypertension, unspecified: Secondary | ICD-10-CM | POA: Insufficient documentation

## 2015-03-22 NOTE — Patient Instructions (Signed)
Heather [name]- it was nice meeting you today...  Your pulmonary hypertension is most probably related to your sleep apnea problem...  We need to investigate your OSA, your prev sleep testing, and your current CPAP apparatus to see if it is doing what it is supposed to do...  We will check w/ the sleep lab and [facility] to get a data download from your machine...  In the meanwhile we will check an overnight oximetry test to see if your oxygen is dropping while you sleep (even w/ the CPAP)...    We will contact you w/ the results when available...   We can then start you on home oxygen & re-assess your pulmonary pressures...  We will be in touch very soon.Marland KitchenMarland Kitchen

## 2015-03-22 NOTE — Progress Notes (Addendum)
Subjective:     Patient ID: Heather [name], female   DOB: 1951-09-01, 64 y.o.   MRN: [id number]  HPI      64 y/o WF, referred by DrFitzgerald for a pulmonary evaluation due to dyspnea and pulmonary hypertension per 2DEcho>  Heather [name] notes SOB/ DOE for over a year now, she has DOE w/ ADLs gradually worse over that time; she denies cough, sputum, hemoptysis; she denies CP, palpit, dizzy/syncope; she has VI w/ periph edema... 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       Her PCP is DrPickard & DrDurham at [facility]> she is followed for Morbid Obesity & OSA on CPAP- pt reports a 3 yr hx OSA, diagnosed by DrPickard on a sleep study and treated by him w/ CPAP; the only avail Sleep Study in Epic was 03/25/12 at the [facility] on [place], [place]- it showed severe OSA w/ AHI=25/hr (52/hr in REM), and RDI=29/hr (54/hr in REM), assoc w/ oxygen desat to 76%, mod snoring, no limb movements- events noted to be worse supine & during REM;  She was supposed to have a CPAP tiration study but I can't find it in Epic, neither can I find download data or a subseq ONO... Notes from [facility] are reviewed>   <01/11/15> c/o SOB (tight & heavy in her chest), cough, wheezing, felt to have an asthma exac- given Pred, AlbutHFA, nasonex...  <01/29/15> c/o persistent wheezing & SOB, plus water retention from the Pred, &  she was given Pulmicort-2spBid...   <02/28/15> f/u visit & recent CXR said to show incr edema so they increased Lasix & referred to Cards- DrFitzgerald...      Her Cardiologist is DrFitzgerald in [place] & EP f/u w/ DrAllred in [place]> she has hx HBP, AFib first in 2004 (several cardioversions at [facility], ? back in sinus rhythm from 2007-2013 & then 6 different cardioversions 2013-2015); last note from DrAllred was 02/03/14- she had cardioversion 12/2013 w/ improved energy but then went back into her atypical AFlutter; at that visit she denied CP, palpit, SOB, and he reported no edema; since she failed several antiarrhythmic meds & DCCV, she was rec to consider another ablation w/ DrFitzgerald... Last note from DrFitzgerald in [place] was 02/01/15- f/u from another cardioversion, on Amio & dose decr from 400=>300 due to elev level, pt holding NSR...        EXAM showed Afeb, VSS, O2sat=97% on RA at rest; Wt=356#, 5'5"Tall, BMI=59;  HEENT- neg, mallampati2;  Chest- decr BS at bases, w/o w/r/r;  Heart- RR gr1/6 SEM w/o r/g;  Abd- obese, neg;  Ext- VI w/ 4+edema...  CT Angio chest 03/06/09 (done for SOB & AFib) showed no PE, mild cardiomeg, otherw neg as well...  Sleep Study in Epic was 03/25/12 at the [facility] on [place], [place]- it showed severe OSA w/  AHI=25/hr (52/hr in REM), and RDI=29/hr (54/hr in REM), assoc w/ oxygen desat to 76%, mod snoring, no limb movements- events noted to be worse supine & during REM...  Lexiscan Myoview 04/08/12 showed norm EF=60%, norm wall motion, soft tissue attenuation & no ischemia...  TEE 12/29/12 showed decr LVF w/ EF=40-45%, diffuse HK, no vegetations, no LA clot, no PFO or R=>L shunt, norm RV...  2DEcho 04/13/13 showed norm LVF w/ EF=55-60%, norm wall motionno signif valve dis, mild-mod LAdil at 43mm and PAsys=63mmHg...  PFT 12/16/13 showed FVC=2.16 (64%), FEV1=1.70 (66%), %1sec=79, mid-flows are reduced at 63% predicted; Lung Vols are mildly reduced (TLC at 73%); DLCO  mildly reduced at 74% predicted (VA is similarly reduced)...   CXR 01/30/15 showed cardiomegaly & prominent central pulm vascularity, mild incr in interstitial markings, no effusion, NAD...  2DEcho 02/14/15 [facility] [place] showed norm LV size & function w/ EF=60-65% & norm wall motion, mild MR, mod LA&RA dil, norm RVF w/ severe pulmHTN (RVSP=80)...  Ambulatory oxygen saturation test 03/22/15> O2sat=97% on RA at rest w/ pulse=54/min; she walked only 1 lap in the office & stopped due to fatigue & SOB- lowest O2sat=86% w/ pulse=83/min...  LABS 5-03/2015:  [place]ms- ok x BS=128, Cr=1.21, A1c=6.1;  CBC- Hg=11.2, MCV=86;  BNP=146;  TSH=9.71 (FreeT3=2.4, FreeT4=1.02)... Note Amio level 01/26/15=3.5 (1.0-2.5).  IMP/PLAN>>  Heather [name] has severe SOB/DOE w/ ADLs and recent 2DEcho in [place] showing severe incr PAsys=87mmHg;  The likely etiology of secondary pulmHTN is her severe morbid obesity (BMI=59), severe OSA & hypoxemia (see above);  She had Bariatric surg by [facility] 2009 & Epic shows f/u visits for 83yr but none since 2010 & she clearly didn't loose any weight (I will call [facility] for details and to consider further options- ?gastric sleeve?);  She indicates that DrPickard started CPAP (she doesn't know the settings) & I cannot find orders or download data in Epic (we will call [facility] for data);  Ambulatory O2 sat here today dropped to 86% on RA after one lap- she needs O2 w/ exercise and needs an ONO as well once we see the download data regarding compliance; consider ABG to r/o OHS... Improving her PA pressure is likely going to depend on appropriate CPAP management, relieving hypoxemia, and losing weight...   ADDENDUM>>  Started on Home oxygen at 2L/min [place] => 24/7 due to her severe pulmHTN ADDENDUM>> We received download data from [facility] ([employer] w/ humidifier)- good compliance w/ CPAP: 90/90 days (Apr-Jun2016), 7-8hrs per night, CPAP=11, AHI on CPAP=1.5.Marland KitchenMarland Kitchen ADDENDUM>>  ONO on CPAP, on O2 at 2L/min => pending (it  was done 04/05/15 ON ROOM AIR w/ desat to 74%, <88% for 3hrs) ADDENDUM>>  04/12/15- she saw [facility] in [place] office- note reviewed, wt=363#, BMI=59.5, they added 0.5cc to lap-band (total 5.0cc), reviewed diet/ nutrition consult etc & goal <300# or consider additional surg. ADDENDUM>>  04/17/15- ONO on O2 at 2L/min & CPAP showed she was still desaturating to <88% for 49.36min & Desat events <88%=41 (Desat index of 11/hr); WE WILL INCR NOCTURNAL O2 to 3L/min   Past Medical History  Diagnosis Date  . HTN (hypertension)   . Obesity   . Carpal tunnel syndrome   . Anemia     "onc;e; had to take iron for awhile"  . Degenerative disk disease   . History of blood transfusion     "w/both hip replacements"  . IBS (irritable bowel syndrome)   . Morton's neuroma     right foot  . Atrial fibrillation  Began in 2004.  Had ablations at [facility] in 2005 and 2007.  She is off coumadin  now with no noted recurrent atrial fibrillation since 2007. til 04/01/12; s/p initiation of Rhythmol with DCCV June 2013  . Hypothyroidism     no meds currently  . Sleep apnea 04/01/12    "dx'd just last week"  . Sleep apnea     uses CPAP, 11 CWP with residual AHI 6.3  . Interstitial cystitis   . GERD (gastroesophageal reflux disease)     resolved after lap band  . Shortness of breath     Past Surgical History  Procedure Laterality Date  . Back surgery    . Carpal tunnel release  1990's    bilaterally  . Lasik    . Cardiac catheterization    . Laparoscopic gastric banding  2009  . Cardiac electrophysiology mapping and ablation  ~ 2005 and 2007    "did more 2nd time; both at [facility]"  . Tonsillectomy and adenoidectomy  1957  . Vaginal hysterectomy  2000  . Tubal ligation  1980  . Dilation and curettage of uterus  2000  . Total hip arthroplasty  ~2009; 2010    right; left  . Joint replacement      bilateral hips  . Posterior fusion lumbar spine  2000's    "nerve problems"  . Posterior fusion  lumbar spine  2000's  . Cardioversion  04/02/2012    Procedure: CARDIOVERSION;  Surgeon: Laurey Morale, MD;  Location: [facility] OR;  Service: Cardiovascular;  Laterality: N/A;  . Cardioversion  06/16/2012    Procedure: CARDIOVERSION;  Surgeon: [name], MD;  Location: [facility] ENDOSCOPY;  Service: Cardiovascular;  Laterality: N/A;  amanda/ebp/Beverly( or scheduling)  . Anterior and posterior repair  10/20/2012    Procedure: ANTERIOR (CYSTOCELE) AND POSTERIOR REPAIR (RECTOCELE);  Surgeon: Esmeralda Arthur, MD;  Location: [facility] ORS;  Service: Gynecology;  Laterality: N/A;  2 hours  . Cardioversion  10/29/2012    Procedure: CARDIOVERSION;  Surgeon: Laurey Morale, MD;  Location: [facility] ENDOSCOPY;  Service: Cardiovascular;  Laterality: N/A;  . Tee without cardioversion N/A 12/29/2012    Procedure: TRANSESOPHAGEAL ECHOCARDIOGRAM (TEE);  Surgeon: Peter M Swaziland, MD;  Location: [facility] ENDOSCOPY;  Service: Cardiovascular;  Laterality: N/A;  . Cardioversion N/A 03/30/2013    Procedure: CARDIOVERSION;  Surgeon: Laurey Morale, MD;  Location: [facility] ENDOSCOPY;  Service: Cardiovascular;  Laterality: N/A;  . Cardioversion N/A 12/23/2013    Procedure: CARDIOVERSION;  Surgeon: Lars Masson, MD;  Location: [facility] ENDOSCOPY;  Service: Cardiovascular;  Laterality: N/A;  9:27 Propofol , IV   150 joules synched shock by Dr. Delton See @ 150 joules...SR   post 12 lead ordered.   . Atrial fibrillation ablation N/A 12/30/2012    Procedure: ATRIAL FIBRILLATION ABLATION;  Surgeon: Hillis Range, MD;  Location: [facility] CATH LAB;  Service: Cardiovascular;  Laterality: N/A;    Outpatient Encounter Prescriptions as of 03/22/2015  Medication Sig  . albuterol (PROVENTIL HFA;VENTOLIN HFA) 108 (90 BASE) MCG/ACT inhaler Inhale 2 puffs into the lungs every 6 (six) hours as needed for wheezing or shortness of breath.  Marland Kitchen amiodarone (PACERONE) 200 MG tablet Take 1 tablet (200 mg total) by mouth daily. (Patient taking differently: Take 200 mg by mouth daily. Take   in AM and  at pm)  . amitriptyline (ELAVIL) 10 MG tablet TAKE 1 TABLET BY MOUTH AT BEDTIME.  Marland Kitchen Cholecalciferol (VITAMIN D) 2000 UNITS tablet Take 2,000 Units by mouth  daily.  . diltiazem (CARDIZEM CD) 120 MG 24 hr capsule take 1 capsule BID  . ELIQUIS 5 MG TABS tablet TAKE 1 TABLET BY MOUTH TWICE A DAY....NEED OFFICE VISIT BEFORE ANY MORE REFILLS  . furosemide (LASIX) 20 MG tablet ?? Take 40 mg by mouth daily ??   NOTE> DrFitzgeralds note 02/01/15-- said she was taking LASIX40- 3 tabs daily...  . guaiFENesin (MUCINEX) 600 MG 12 hr tablet Take 600 mg by mouth 2 (two) times daily as needed. For allergies.  . metoprolol tartrate (LOPRESSOR) 25 MG tablet Take 25 mg by mouth 2 (two) times daily.   . Budesonide 90 MCG/ACT inhaler Inhale 1 puff into the lungs 2 (two) times daily.  . cetirizine (ZYRTEC) 10 MG tablet Take 10 mg by mouth daily.  Marland Kitchen losartan (COZAAR) 100 MG tablet TAKE 1 TABLET BY MOUTH EVERY DAY   No facility-administered encounter medications on file as of 03/22/2015.    Allergies  Allergen Reactions  . Cephalexin Hives and Other (See Comments)    "throat started closing up"  . Rocephin [Ceftriaxone Sodium In Dextrose] Anaphylaxis  . Penicillins Rash  . Flecainide Acetate Other (See Comments)    "couldn't take it"  . Moxifloxacin     Does not remember the reaction  . Sulfonamide Derivatives     Does not remember reaction ; "was so young when I had reaction to it"    Immunization History  Administered Date(s) Administered  . Influenza-Unspecified 11/04/2012, 08/24/2014  . Zoster 03/24/2013    Family History  Problem Relation Age of Onset  . Heart attack      granfather  . Hypertension Father   . Diabetes Father   . Dementia Father   . Atrial fibrillation Mother   . Diabetes Sister   . Diabetes Brother   . Heart disease Maternal Grandmother     History   Social History  . Marital Status: Married    Spouse Name: N/A  . Number of Children: N/A  .  Years of Education: N/A   Occupational History  . Not on file.   Social History Main Topics  . Smoking status: Never Smoker   . Smokeless tobacco: Never Used  . Alcohol Use: No  . Drug Use: No  . Sexual Activity: Yes     Comment: LAVH   Other Topics Concern  . Not on file   Social History Narrative   Works in data entry in [employer]   Married, lives in [place]   Tobacco Use - No.    Alcohol Use - no    Current Medications, Allergies, Past Medical History, Past Surgical History, Family History, and Social History were reviewed in [employer] record.   Review of Systems            All symptoms NEG except where BOLDED >>  Constitutional:  F/C/S, fatigue, anorexia, unexpected weight change. HEENT:  HA, visual changes, hearing loss, earache, nasal symptoms, sore throat, mouth sores, hoarseness. Resp:  cough, sputum, hemoptysis; SOB, tightness, wheezing. Cardio:  CP, palpit, DOE, orthopnea, edema. GI:  N/V/D/C, blood in stool; reflux, abd pain, distention, gas. GU:  dysuria, freq, urgency, hematuria, flank pain, voiding difficulty. MS:  joint pain, swelling, tenderness, decr ROM; neck pain, back pain, etc. Neuro:  HA, tremors, seizures, dizziness, syncope, weakness, numbness, gait abn. Skin:  suspicious lesions or skin rash. Heme:  adenopathy, bruising, bleeding. Psyche:  confusion, agitation, sleep disturbance, hallucinations, anxiety, depression suicidal.   Objective:  Physical Exam      Vital Signs:  Reviewed...  General:  WD, morbidly obese, 64 y/o WF in NAD; alert & oriented; pleasant & cooperative... HEENT:  [place]/AT; Conjunctiva- pink, Sclera- nonicteric, EOM-wnl, PERRLA, EACs-clear, TMs-wnl; NOSE-clear; THROAT-clear & wnl. Neck:  Supple w/ decr ROM; no JVD; normal carotid impulses w/o bruits; no thyromegaly or nodules palpated; no lymphadenopathy. Chest:  decr BS bilat at bases, but clear without wheezes, rales, or rhonchi heard. Heart:   Regular Rhythm; gr1/6SEM without rubs or gallops detected. Abdomen:  Obese, soft & nontender- no guarding or rebound; normal bowel sounds; no organomegaly or masses palpated. Ext:  decrROM; +arthritic changes; no varicose veins, +venous insuffic, 2+ edema;  Pulses intact w/o bruits. Neuro:  No focal neuro deficits, +gait abnormality & balance fair... Derm:  No lesions noted; no rash etc. Lymph:  No cervical, supraclavicular, axillary, or inguinal adenopathy palpated.   Assessment:      IMP >>  PulmHTN by 2DEcho Morbid Obesity w/ BMI=59, s/p lap band 2009 Severe OSA w/ AHI=25/hr (52/hr in REM) & oxygen desaturation to 76% on sleep study 2013 Recurrent/refractory AFib/Flutter managed by DrFitzgerald in [place] on Eliquis, Amio, Metop, Cardizem, Losar, Lasix Mult medical problems managed by DrPickard  PLAN >>  Heather [name] has severe SOB/DOE w/ ADLs and recent 2DEcho in [place] showing severe incr PAsys=89mmHg;  The likely etiology of secondary pulmHTN is her severe morbid obesity (BMI=59), severe OSA & hypoxemia (see above);  She had Bariatric surg by [facility] 2009 & Epic shows f/u visits for 23yr but none since 2010 & she clearly didn't loose any weight (I will call [facility] for details and to consider further options- ?gastric sleeve?);  She indicates that DrPickard started CPAP (she doesn't know the settings) & I cannot find orders or download data in Epic (we will call [facility] for data);  Ambulatory O2 sat here today dropped to 86% on RA after one lap- she needs O2 w/ exercise and needs an ONO as well once we see the download data regarding compliance; consider ABG to r/o OHS... Improving her PA pressure is likely going to depend on appropriate CPAP management, relieving hypoxemia, and losing weight.      Plan:     Patient's Medications  New Prescriptions   No medications on file  Previous Medications   ALBUTEROL (PROVENTIL HFA;VENTOLIN HFA) 108 (90 BASE) MCG/ACT INHALER    Inhale 2 puffs into the lungs  every 6 (six) hours as needed for wheezing or shortness of breath.   AMIODARONE (PACERONE) 200 MG TABLET    Take 1 tablet (200 mg total) by mouth daily.   AMITRIPTYLINE (ELAVIL) 10 MG TABLET    TAKE 1 TABLET BY MOUTH AT BEDTIME.   BUDESONIDE 90 MCG/ACT INHALER    Inhale 1 puff into the lungs 2 (two) times daily.   CETIRIZINE (ZYRTEC) 10 MG TABLET    Take 10 mg by mouth daily.   CHOLECALCIFEROL (VITAMIN D) 2000 UNITS TABLET    Take 2,000 Units by mouth daily.   DILTIAZEM (CARDIZEM CD) 120 MG 24 HR CAPSULE    take 1 capsule BID   ELIQUIS 5 MG TABS TABLET    TAKE 1 TABLET BY MOUTH TWICE A DAY....NEED OFFICE VISIT BEFORE ANY MORE REFILLS   FUROSEMIDE (LASIX) 20 MG TABLET    Take 40 mg by mouth daily.   GUAIFENESIN (MUCINEX) 600 MG 12 HR TABLET    Take 600 mg by mouth 2 (two) times daily as needed. For allergies.  LOSARTAN (COZAAR) 100 MG TABLET    TAKE 1 TABLET BY MOUTH EVERY DAY   METOPROLOL TARTRATE (LOPRESSOR) 25 MG TABLET    Take 25 mg by mouth 2 (two) times daily.   Modified Medications   No medications on file  Discontinued Medications   No medications on file

## 2015-04-01 ENCOUNTER — Other Ambulatory Visit: Payer: Self-pay | Admitting: Family Medicine

## 2015-04-02 ENCOUNTER — Telehealth: Payer: Self-pay | Admitting: Pulmonary Disease

## 2015-04-02 ENCOUNTER — Other Ambulatory Visit: Payer: Self-pay | Admitting: Family Medicine

## 2015-04-02 MED ORDER — LOSARTAN POTASSIUM 100 MG PO TABS
100.0000 mg | ORAL_TABLET | Freq: Every day | ORAL | Status: DC
Start: 2015-04-02 — End: 2015-04-02

## 2015-04-02 MED ORDER — LOSARTAN POTASSIUM 100 MG PO TABS: 100.0000 mg | ORAL_TABLET | Freq: Every day | ORAL | Status: DC

## 2015-04-02 NOTE — Telephone Encounter (Signed)
Spoke with pt. She reports she is suppose to have ONO done and has not heard from [facility]. I have sent [facility] a staff message to check on this.

## 2015-04-02 NOTE — Telephone Encounter (Signed)
Medication refilled per protocol. 

## 2015-04-03 NOTE — Telephone Encounter (Signed)
Heather [name]  Tommie Sams, CMA           I am not showing that this order was rec'd but I am sending now.  Thank you  [facility] and made pt aware. Nothing further needed

## 2015-04-04 ENCOUNTER — Other Ambulatory Visit: Payer: Self-pay | Admitting: Family Medicine

## 2015-04-04 MED ORDER — AMITRIPTYLINE HCL 10 MG PO TABS: 10.0000 mg | ORAL_TABLET | Freq: Every day | ORAL | Status: DC

## 2015-04-04 NOTE — Telephone Encounter (Signed)
Medication refilled per protocol. To mail order

## 2015-04-10 ENCOUNTER — Telehealth: Payer: Self-pay | Admitting: Pulmonary Disease

## 2015-04-10 ENCOUNTER — Other Ambulatory Visit: Payer: BC Managed Care – PPO

## 2015-04-10 DIAGNOSIS — I272 Pulmonary hypertension, unspecified: Secondary | ICD-10-CM

## 2015-04-10 DIAGNOSIS — E039 Hypothyroidism, unspecified: Secondary | ICD-10-CM

## 2015-04-10 DIAGNOSIS — G4733 Obstructive sleep apnea (adult) (pediatric): Secondary | ICD-10-CM

## 2015-04-10 LAB — T3, FREE: T3 FREE: 2.3 pg/mL (ref 2.3–4.2)

## 2015-04-10 LAB — T4, FREE: Free T4: 1.07 ng/dL (ref 0.80–1.80)

## 2015-04-10 LAB — TSH: TSH: 11.445 u[IU]/mL — AB (ref 0.350–4.500)

## 2015-04-10 NOTE — Telephone Encounter (Signed)
Per SN  -Place order for pt to start cont o2 at 2L via nasal cannula -Inform pt to f/u with Dr Ezzard StandingNewman for f/u of lap band surgery -Ask pt to bring CPAP machine to office for possible download -Go by Dr Caren MacadamPickard's office to sign consent form for SN to obtain record  Called and spoke with pt about SN rec Pt stated that she recently [facility] so she could get CPAP supplies Informed pt that office would call [facility] to see if we could obtain download  Pt stated that she would go by Dr Caren MacadamPickard's office tomorrow to sign release form   Order has been placed for pt to start O2 Will keep message open to f/u about [facility]

## 2015-04-10 NOTE — Telephone Encounter (Signed)
Patient says that she came in to see Dr. Kriste Basquenadel 3 weeks ago, her breathing is getting worse.  Husband is concerned about patient and wants her on oxygen.  She gets dizzy and has to sit down, she cannot walk 10 steps without becoming breathless.  She cannot sit on side of bed to get dressed without losing her breath. Using rescue inhaler, but does not help.   Allergies  Allergen Reactions  . Cephalexin Hives and Other (See Comments)    "throat started closing up"  . Rocephin [Ceftriaxone Sodium In Dextrose] Anaphylaxis  . Penicillins Rash  . Flecainide Acetate Other (See Comments)    "couldn't take it"  . Moxifloxacin     Does not remember the reaction  . Sulfonamide Derivatives     Does not remember reaction ; "was so young when I had reaction to it"    SN - please advise.

## 2015-04-11 ENCOUNTER — Other Ambulatory Visit: Payer: Self-pay | Admitting: *Deleted

## 2015-04-11 DIAGNOSIS — E039 Hypothyroidism, unspecified: Secondary | ICD-10-CM

## 2015-04-11 MED ORDER — LEVOTHYROXINE SODIUM 25 MCG PO TABS: 25.0000 ug | ORAL_TABLET | Freq: Every day | ORAL | Status: DC

## 2015-04-11 NOTE — Telephone Encounter (Signed)
Per SN, CPAP download reviewed  Please advise pt that download was received and does show compliance using the machine Place order for ONO to be done on 2L cont o2 and of CPAP to see if pt desats at night Test needs to be done to further evaluation of the pulmonary hypertension  Patient called  Left voicemail for pt to call back to discuss test Order has been placed for ONO on 2L with CPAP  Waiting on return call from pt

## 2015-04-11 NOTE — Telephone Encounter (Signed)
Download has been received and placed on SN cart. Please advise thanks

## 2015-04-11 NOTE — Telephone Encounter (Signed)
Heather [name], states she is sending download to triage fax now, [phone]

## 2015-04-11 NOTE — Telephone Encounter (Signed)
lmtcb for [place], [facility].

## 2015-04-12 ENCOUNTER — Other Ambulatory Visit: Payer: Self-pay | Admitting: Surgery

## 2015-04-12 ENCOUNTER — Ambulatory Visit
Admission: RE | Admit: 2015-04-12 | Discharge: 2015-04-12 | Disposition: A | Discharge status: Home / Self Care | Payer: BC Managed Care – PPO | Source: Ambulatory Visit | Attending: Surgery | Admitting: Surgery

## 2015-04-12 ENCOUNTER — Other Ambulatory Visit: Payer: Self-pay | Admitting: *Deleted

## 2015-04-12 DIAGNOSIS — Z9884 Bariatric surgery status: Secondary | ICD-10-CM

## 2015-04-12 NOTE — Telephone Encounter (Signed)
Spoke with pt, she is aware that Heather [name] will be ordered.    Rachel/Dr Kriste BasqueNadel please advise on what pulmonary hypertension testing needs to be done.  Thanks!

## 2015-04-17 NOTE — Telephone Encounter (Signed)
SN please advise. Thanks.  

## 2015-04-17 NOTE — Telephone Encounter (Signed)
ONO was the test that was going to be ordered to follow up on pulm hypertension Test has already been ordered Nothing further is needed.

## 2015-04-26 ENCOUNTER — Other Ambulatory Visit: Payer: Self-pay | Admitting: Pulmonary Disease

## 2015-04-26 DIAGNOSIS — G4733 Obstructive sleep apnea (adult) (pediatric): Secondary | ICD-10-CM

## 2015-04-26 DIAGNOSIS — I272 Pulmonary hypertension, unspecified: Secondary | ICD-10-CM

## 2015-04-27 ENCOUNTER — Other Ambulatory Visit: Payer: Self-pay | Admitting: Family Medicine

## 2015-04-27 NOTE — Telephone Encounter (Signed)
Medication refilled per protocol. 

## 2015-04-30 ENCOUNTER — Other Ambulatory Visit: Payer: Self-pay

## 2015-04-30 ENCOUNTER — Encounter: Payer: Self-pay | Admitting: Pulmonary Disease

## 2015-05-03 ENCOUNTER — Encounter: Payer: Self-pay | Admitting: Pulmonary Disease

## 2015-05-30 ENCOUNTER — Encounter: Payer: BC Managed Care – PPO | Attending: Surgery | Admitting: Dietician

## 2015-05-30 DIAGNOSIS — Z713 Dietary counseling and surveillance: Secondary | ICD-10-CM | POA: Diagnosis not present

## 2015-05-30 DIAGNOSIS — Z6841 Body Mass Index (BMI) 40.0 and over, adult: Secondary | ICD-10-CM | POA: Insufficient documentation

## 2015-05-30 NOTE — Patient Instructions (Addendum)
-  Continue to work on training your taste buds to prefer less sweetener  -Try Dannon Light and Fit Greek yogurt or Oikos Triple Zero yogurt  -Aim for 0-15 grams of carbs per meals and snacks

## 2015-05-30 NOTE — Progress Notes (Signed)
  Medical Nutrition Therapy:  Appt start time: 1055 end time:  1135   Assessment:  Primary concerns today: Heather [name] is here today for a bariatric diet refresher. She had a Lap Band placed in summer 2009. She reports that after surgery she lost 100 pounds. However, since retiring she has had significant regain. She has lost a significant amount of weight recently (20 pounds since June 30). She attributes weight loss to carb reduction. She is having a hard time avoiding sweeteners. She thinks that sweeteners satisfy her more. Has a follow up appointment scheduled with Dr. Ezzard Standing at the end of September. She plans to follow up with him every 6 to 8 weeks. She has regurgitation about 2x a week, cannot see a pattern with types of foods. Unsure if she is eating too fast. Has really been focusing on eating slowly. She had a band fill at last visit with Dr. Ezzard Standing. She has felt a good amount of restriction with recent fill, stating "I'm happy with it." She does not drink while she eats. Heather [name] can tolerate 2-3 oz of meat at a time. She gets hiccups when she is full. Eats 1 piece of fruit per day and finds that the sweetness of fruit satisfies her. Heather [name] lives with her husband. Her husband is supportive; however, not willing to remove trigger foods from the home.     Preferred Learning Style:   No preference indicated   Learning Readiness:   Ready   MEDICATIONS: see list   DIETARY INTAKE:  Avoided foods include bread (gets stuck) and most other carbs for weight loss. Tough meats.    24-hr recall:  B ( AM): Atkins shake or eggs with 1 piece of bacon or sausage or smoothie with protein powder, 1/2 cup blueberries, and PB2 Snk ( AM):   L ( PM): meat and non starchy vegetables OR cottage cheese and fruit Snk ( PM):  D ( PM): meat with vegetables Snk ( PM): sugar free jello or 1 scoop low carb ice cream  Beverages: coffee with 1 pack sweetener, unsweet tea, and water with lemon or lime  Usual  physical activity: none due to breathing issues  Estimated energy needs: 1200 calories  Progress Towards Goal(s):  In progress.   Nutritional Diagnosis:  [place]-3.4 Unintentional weight gain As related to increased energy intake, physical inactivity, and inappropriate food choices.  As evidenced by recent significant weight gain per patient report.    Intervention:  Nutrition counseling provided. Reviewed post op bariatric surgery nutritional guidelines. Praised patient on recent weight loss.  Teaching Method Utilized:  Visual Auditory Hands on  Handouts given during visit include:  Phase 3A lean proteins  Phase 3B lean protein + non starchy vegetables  Bariatric snack list  Barriers to learning/adherence to lifestyle change: oxygen-dependent, limited physical activity  Demonstrated degree of understanding via:  Teach Back   Monitoring/Evaluation:  Dietary intake, exercise, and body weight in 2 month(s).

## 2015-06-01 ENCOUNTER — Encounter: Payer: Self-pay | Admitting: Dietician

## 2015-06-04 ENCOUNTER — Other Ambulatory Visit: Payer: Self-pay | Admitting: Family Medicine

## 2015-06-04 NOTE — Telephone Encounter (Signed)
Refill appropriate and filled per protocol. 

## 2015-07-06 ENCOUNTER — Other Ambulatory Visit: Payer: BC Managed Care – PPO

## 2015-07-06 DIAGNOSIS — E039 Hypothyroidism, unspecified: Secondary | ICD-10-CM

## 2015-07-06 LAB — T4, FREE: Free T4: 1.05 ng/dL (ref 0.80–1.80)

## 2015-07-06 LAB — T3, FREE: T3, Free: 2.3 pg/mL (ref 2.3–4.2)

## 2015-07-06 LAB — TSH: TSH: 6.079 u[IU]/mL — ABNORMAL HIGH (ref 0.350–4.500)

## 2015-07-09 ENCOUNTER — Other Ambulatory Visit: Payer: Self-pay | Admitting: *Deleted

## 2015-07-09 MED ORDER — LEVOTHYROXINE SODIUM 50 MCG PO TABS: 50.0000 ug | ORAL_TABLET | Freq: Every day | ORAL | Status: DC

## 2015-07-10 ENCOUNTER — Other Ambulatory Visit: Payer: BC Managed Care – PPO

## 2015-07-10 ENCOUNTER — Other Ambulatory Visit: Payer: Self-pay | Admitting: *Deleted

## 2015-07-10 MED ORDER — LEVOTHYROXINE SODIUM 50 MCG PO TABS: 50.0000 ug | ORAL_TABLET | Freq: Every day | ORAL | Status: DC

## 2015-07-30 ENCOUNTER — Encounter: Payer: BC Managed Care – PPO | Attending: Surgery | Admitting: Dietician

## 2015-07-30 ENCOUNTER — Encounter: Payer: Self-pay | Admitting: Dietician

## 2015-07-30 DIAGNOSIS — Z713 Dietary counseling and surveillance: Secondary | ICD-10-CM | POA: Diagnosis not present

## 2015-07-30 DIAGNOSIS — Z6841 Body Mass Index (BMI) 40.0 and over, adult: Secondary | ICD-10-CM | POA: Insufficient documentation

## 2015-07-30 NOTE — Progress Notes (Signed)
  Medical Nutrition Therapy:  Appt start time: 1115 end time:  1135   Assessment:  Primary concerns today: Heather [name] returns today having lost another 15 pounds in the last 2 months. She followed up with Dr. Ezzard StandingNewman at the end of September and chose not to have a fill. Excited to have lost a lot of weight despite having been on vacation for a week. Continuing to make low carb choices and trying new recipes. She feel like she gets bored with her food choices easily. Feels like she has hot flashes when she has a lot of protein shakes. Still eating mainly 3 meals and no snacks, sometimes has fruit in the afternoon. Drinking 30 oz water with flavoring plus protein shakes, tea, coffee, and milk but still feels like she is not getting 64 oz total. As the weather is cooling off she has been able to walk more. Cannot tolerate Calcium supplement.   Preferred Learning Style:   No preference indicated   Learning Readiness:   Ready   MEDICATIONS: see list   DIETARY INTAKE:  Avoided foods include bread (gets stuck) and most other carbs for weight loss. Tough meats.    24-hr recall:  B ( AM): Atkins shake or eggs with 1 piece of bacon or sausage or smoothie with protein powder, 1/2 cup blueberries, and PB2 Snk ( AM):   L ( PM): meat and non starchy vegetables OR cottage cheese and fruit Snk ( PM):  D ( PM): meat with vegetables Snk ( PM): sugar free jello or 1 scoop low carb ice cream  Beverages: coffee with 1 pack sweetener, unsweet tea, and water with lemon or lime  Usual physical activity: none due to breathing issues  Estimated energy needs: 1200 calories  Progress Towards Goal(s):  In progress.   Nutritional Diagnosis:  [place]-3.4 Unintentional weight gain As related to increased energy intake, physical inactivity, and inappropriate food choices.  As evidenced by recent significant weight gain per patient report.    Intervention:  Nutrition counseling provided. Reviewed post op bariatric  surgery nutritional guidelines. Praised patient on recent weight loss.  Teaching Method Utilized:  Visual Auditory Hands on  Barriers to learning/adherence to lifestyle change: oxygen-dependent, limited physical activity  Demonstrated degree of understanding via:  Teach Back   Monitoring/Evaluation:  Dietary intake, exercise, and body weight in 2 month(s).

## 2015-07-30 NOTE — Patient Instructions (Addendum)
-  Keep walking as tolerated  -Start taking a sublingual vitamin B12 (300-500 mcg per day)

## 2015-08-08 ENCOUNTER — Encounter: Payer: Self-pay | Admitting: Physician Assistant

## 2015-08-08 ENCOUNTER — Ambulatory Visit (INDEPENDENT_AMBULATORY_CARE_PROVIDER_SITE_OTHER): Payer: BC Managed Care – PPO | Admitting: Physician Assistant

## 2015-08-08 VITALS — BP 112/72 | HR 60 | Temp 98.1°F | Resp 18 | Wt 331.0 lb

## 2015-08-08 DIAGNOSIS — J988 Other specified respiratory disorders: Secondary | ICD-10-CM | POA: Diagnosis not present

## 2015-08-08 DIAGNOSIS — B9689 Other specified bacterial agents as the cause of diseases classified elsewhere: Principal | ICD-10-CM

## 2015-08-08 MED ORDER — HYDROCOD POLST-CPM POLST ER 10-8 MG/5ML PO SUER: 5.0000 mL | Freq: Two times a day (BID) | ORAL | Status: DC | PRN

## 2015-08-08 MED ORDER — DOXYCYCLINE HYCLATE 100 MG PO TABS
100.0000 mg | ORAL_TABLET | Freq: Two times a day (BID) | ORAL | Status: DC
Start: 2015-08-08 — End: 2015-10-01

## 2015-08-08 NOTE — Progress Notes (Signed)
Patient ID: DUANE TRIAS MRN: [id number], DOB: 1950-11-04, 64 y.o. Date of Encounter: 08/08/2015, 12:26 PM    Chief Complaint:  Chief Complaint  Patient presents with  . sick x 4 days    cough/congestion     HPI: 64 y.o. year old white female presents with above. States that symptoms started in her head as far as head congestion and drainage from her head. Now also in her chest with congested cough. Is using Mucinex DM. No significant sore throat or earache and no fevers or chills.      Home Meds:   Outpatient Prescriptions Prior to Visit  Medication Sig Dispense Refill  . albuterol (PROVENTIL HFA;VENTOLIN HFA) 108 (90 BASE) MCG/ACT inhaler Inhale 2 puffs into the lungs every 6 (six) hours as needed for wheezing or shortness of breath. 1 Inhaler 0  . amiodarone (PACERONE) 200 MG tablet Take 1 tablet (200 mg total) by mouth daily. 90 tablet 0  . amitriptyline (ELAVIL) 10 MG tablet Take 1 tablet (10 mg total) by mouth at bedtime. 90 tablet 1  . Budesonide 90 MCG/ACT inhaler Inhale 1 puff into the lungs 2 (two) times daily.    . cetirizine (ZYRTEC) 10 MG tablet Take 10 mg by mouth daily.    . Cholecalciferol (VITAMIN D) 2000 UNITS tablet Take 2,000 Units by mouth daily.    Marland Kitchen diltiazem (CARDIZEM CD) 120 MG 24 hr capsule take 1 capsule BID    . ELIQUIS 5 MG TABS tablet TAKE 1 TABLET BY MOUTH TWICE A DAY....NEED OFFICE VISIT BEFORE ANY MORE REFILLS 60 tablet 6  . furosemide (LASIX) 20 MG tablet Take 40 mg by mouth daily.    Marland Kitchen guaiFENesin (MUCINEX) 600 MG 12 hr tablet Take 600 mg by mouth 2 (two) times daily as needed. For allergies.    Marland Kitchen levothyroxine (SYNTHROID, LEVOTHROID) 50 MCG tablet Take 1 tablet (50 mcg total) by mouth daily before breakfast. 30 tablet 0  . losartan (COZAAR) 100 MG tablet TAKE 1 TABLET BY MOUTH EVERY DAY 30 tablet 5  . metoprolol tartrate (LOPRESSOR) 25 MG tablet Take 25 mg by mouth 2 (two) times daily.   11   No facility-administered medications prior to  visit.    Allergies:  Allergies  Allergen Reactions  . Cephalexin Hives and Other (See Comments)    "throat started closing up"  . Rocephin [Ceftriaxone Sodium In Dextrose] Anaphylaxis  . Penicillins Rash  . Flecainide Acetate Other (See Comments)    "couldn't take it"  . Moxifloxacin     Does not remember the reaction  . Sulfonamide Derivatives     Does not remember reaction ; "was so young when I had reaction to it"      Review of Systems: See HPI for pertinent ROS. All other ROS negative.    Physical Exam: Blood pressure 112/72, pulse 60, temperature 98.1 F (36.7 C), temperature source Oral, resp. rate 18, weight 331 lb (150.141 kg)., Body mass index is 53.45 kg/(m^2). General:  Severely obese white female . Appears in no acute distress. HEENT: Normocephalic, atraumatic, eyes without discharge, sclera non-icteric, nares are without discharge. Bilateral auditory canals clear, TM's are without perforation, pearly grey and translucent with reflective cone of light bilaterally. Oral cavity moist, posterior pharynx without exudate, erythema, peritonsillar abscess. No tenderness with percussion of frontal or maxillary sinuses.  Neck: Supple. No thyromegaly. No lymphadenopathy. Lungs: Clear bilaterally to auscultation without wheezes, rales, or rhonchi. Breathing is unlabored. Heart: Regular rhythm. No murmurs, rubs,  or gallops. Msk:  Strength and tone normal for age. Extremities/Skin: Warm and dry.  Neuro: Alert and oriented X 3. Moves all extremities spontaneously. Gait is normal. CNII-XII grossly in tact. Psych:  Responds to questions appropriately with a normal affect.     ASSESSMENT AND PLAN:  64 y.o. year old female with  1. Bacterial respiratory infection She requested refill on Tussionex. Says that in one of her last illnesses she tried the Cheratussin and that did not work for her at all. In the past she has used Tussionex and that has been very helpful. She has  multiple antibiotic allergies including penicillin sulfa moxifloxacin and Rocephin. She is to start the doxycycline take as directed and complete all of it. Cont Mucinex DM as expectorant. Follow-up if symptoms worsen significantly or do not resolve after completion of antibiotic. - doxycycline (VIBRA-TABS) 100 MG tablet; Take 1 tablet (100 mg total) by mouth 2 (two) times daily.  Dispense: 20 tablet; Refill: 0 - chlorpheniramine-HYDROcodone (TUSSIONEX PENNKINETIC ER) 10-8 MG/5ML SUER; Take 5 mLs by mouth every 12 (twelve) hours as needed for cough.  Dispense: 140 mL; Refill: 0   Signed, [address]Annikah Lovins Beth [place], [place], [facility] 08/08/2015 12:26 PM

## 2015-10-01 ENCOUNTER — Ambulatory Visit (INDEPENDENT_AMBULATORY_CARE_PROVIDER_SITE_OTHER): Payer: BC Managed Care – PPO | Admitting: Physician Assistant

## 2015-10-01 ENCOUNTER — Encounter: Payer: Self-pay | Admitting: Physician Assistant

## 2015-10-01 VITALS — BP 132/86 | HR 60 | Temp 98.3°F | Resp 18 | Wt 325.0 lb

## 2015-10-01 DIAGNOSIS — B9689 Other specified bacterial agents as the cause of diseases classified elsewhere: Principal | ICD-10-CM

## 2015-10-01 DIAGNOSIS — J988 Other specified respiratory disorders: Secondary | ICD-10-CM

## 2015-10-01 MED ORDER — DOXYCYCLINE HYCLATE 100 MG PO TABS: 100.0000 mg | ORAL_TABLET | Freq: Two times a day (BID) | ORAL | Status: DC

## 2015-10-01 MED ORDER — PREDNISONE 20 MG PO TABS: ORAL_TABLET | ORAL | Status: DC

## 2015-10-01 MED ORDER — HYDROCOD POLST-CPM POLST ER 10-8 MG/5ML PO SUER: 5.0000 mL | Freq: Two times a day (BID) | ORAL | Status: DC | PRN

## 2015-10-01 NOTE — Progress Notes (Signed)
Patient ID: Heather [name] MRN: [id number], DOB: [birth year], 64 y.o. Date of Encounter: 10/01/2015, 11:41 AM    Chief Complaint:  Chief Complaint  Patient presents with  . still sick    congestion, productive cough     HPI: 64 y.o. year old female here with above.   Reviewed LOV note 08/08/15.  She says that cleared up.   Now with new illness. Says this started in hr throat then went to chest. Has heard some wheezing "this time--that she didn't have with last illness". Used Albuterol yesterday. No fever/chills. No sore throat.      Home Meds:   Outpatient Prescriptions Prior to Visit  Medication Sig Dispense Refill  . albuterol (PROVENTIL HFA;VENTOLIN HFA) 108 (90 BASE) MCG/ACT inhaler Inhale 2 puffs into the lungs every 6 (six) hours as needed for wheezing or shortness of breath. 1 Inhaler 0  . amiodarone (PACERONE) 200 MG tablet Take 1 tablet (200 mg total) by mouth daily. 90 tablet 0  . amitriptyline (ELAVIL) 10 MG tablet Take 1 tablet (10 mg total) by mouth at bedtime. 90 tablet 1  . Budesonide 90 MCG/ACT inhaler Inhale 1 puff into the lungs 2 (two) times daily.    . cetirizine (ZYRTEC) 10 MG tablet Take 10 mg by mouth daily.    . Cholecalciferol (VITAMIN D) 2000 UNITS tablet Take 2,000 Units by mouth daily.    Marland Kitchen diltiazem (CARDIZEM CD) 120 MG 24 hr capsule take 1 capsule BID    . ELIQUIS 5 MG TABS tablet TAKE 1 TABLET BY MOUTH TWICE A DAY....NEED OFFICE VISIT BEFORE ANY MORE REFILLS 60 tablet 6  . furosemide (LASIX) 20 MG tablet Take 40 mg by mouth daily.    Marland Kitchen guaiFENesin (MUCINEX) 600 MG 12 hr tablet Take 600 mg by mouth 2 (two) times daily as needed. For allergies.    Marland Kitchen levothyroxine (SYNTHROID, LEVOTHROID) 50 MCG tablet Take 1 tablet (50 mcg total) by mouth daily before breakfast. 30 tablet 0  . losartan (COZAAR) 100 MG tablet TAKE 1 TABLET BY MOUTH EVERY DAY 30 tablet 5  . metoprolol tartrate (LOPRESSOR) 25 MG tablet Take 25 mg by mouth 2 (two) times daily.   11    . chlorpheniramine-HYDROcodone (TUSSIONEX PENNKINETIC ER) 10-8 MG/5ML SUER Take 5 mLs by mouth every 12 (twelve) hours as needed for cough. 140 mL 0  . doxycycline (VIBRA-TABS) 100 MG tablet Take 1 tablet (100 mg total) by mouth 2 (two) times daily. 20 tablet 0   No facility-administered medications prior to visit.    Allergies:  Allergies  Allergen Reactions  . Cephalexin Hives and Other (See Comments)    "throat started closing up"  . Rocephin [Ceftriaxone Sodium In Dextrose] Anaphylaxis  . Penicillins Rash  . Flecainide Acetate Other (See Comments)    "couldn't take it"  . Moxifloxacin     Does not remember the reaction  . Sulfonamide Derivatives     Does not remember reaction ; "was so young when I had reaction to it"      Review of Systems: See HPI for pertinent ROS. All other ROS negative.    Physical Exam: Blood pressure 132/86, pulse 60, temperature 98.3 F (36.8 C), temperature source Oral, resp. rate 18, weight 325 lb (147.419 kg)., Body mass index is 52.48 kg/(m^2). General:  Severely Obese WF. Appears in no acute distress. HEENT: Normocephalic, atraumatic, eyes without discharge, sclera non-icteric, nares are without discharge. Bilateral auditory canals clear, TM's are without perforation, pearly grey  and translucent with reflective cone of light bilaterally. Oral cavity moist, posterior pharynx without exudate, erythema, peritonsillar abscess.  Neck: Supple. No thyromegaly. No lymphadenopathy. Lungs: Very tiny, high pitched wheeze at very end of expiration--heard throughout, bilaterally. O/W clear. Good air movement.  Wearing nasal cannula oxygen.  Heart: Regular rhythm. No murmurs, rubs, or gallops. Msk:  Strength and tone normal for age. Extremities/Skin: Warm and dry.  Neuro: Alert and oriented X 3. Moves all extremities spontaneously. Gait is normal. CNII-XII grossly in tact. Psych:  Responds to questions appropriately with a normal affect.     ASSESSMENT  AND PLAN:  64 y.o. year old female with  1. Bacterial respiratory infection She is to take abx and prednisone as directed. Use Tussionex as needed. F/U if symptoms worsen or do note resolve upon completion of abx and prednisone.  - doxycycline (VIBRA-TABS) 100 MG tablet; Take 1 tablet (100 mg total) by mouth 2 (two) times daily.  Dispense: 20 tablet; Refill: 0 - chlorpheniramine-HYDROcodone (TUSSIONEX PENNKINETIC ER) 10-8 MG/5ML SUER; Take 5 mLs by mouth every 12 (twelve) hours as needed for cough.  Dispense: 140 mL; Refill: 0 - predniSONE (DELTASONE) 20 MG tablet; Take 2 daily for 5 days.  Dispense: 10 tablet; Refill: 0   Signed, [address] Beth [place], [place], [facility] 10/01/2015 11:41 AM

## 2015-10-22 ENCOUNTER — Other Ambulatory Visit: Payer: Self-pay | Admitting: Family Medicine

## 2015-10-22 NOTE — Telephone Encounter (Signed)
Refill appropriate and filled per protocol. 

## 2015-11-18 ENCOUNTER — Other Ambulatory Visit: Payer: Self-pay | Admitting: Family Medicine

## 2015-11-19 NOTE — Telephone Encounter (Signed)
Refill appropriate and filled per protocol. 

## 2015-11-26 ENCOUNTER — Telehealth: Payer: Self-pay | Admitting: Family Medicine

## 2015-11-26 NOTE — Telephone Encounter (Signed)
Pt states that her Cardiologist faxed lab orders to [place] for her to have drawn here. I advised her that we do not typically take orders from other providers, but i would have a nurse check and get back in touch with her.

## 2015-11-27 NOTE — Telephone Encounter (Signed)
Pt called and informed that our lab is specific for our doctors and not a draw site. If the cardiologist is drawing the same labs that we would draw we will be happy to do that and then fax to him but if he wants something specific then she will have to go to a draw site. She verbalized understanding.

## 2016-01-15 ENCOUNTER — Other Ambulatory Visit: Payer: Self-pay | Admitting: Family Medicine

## 2016-01-15 NOTE — Telephone Encounter (Signed)
Refill appropriate and filled per protocol. 

## 2016-04-18 ENCOUNTER — Other Ambulatory Visit: Payer: Self-pay | Admitting: Family Medicine

## 2016-04-18 NOTE — Telephone Encounter (Signed)
Refill appropriate and filled per protocol. 

## 2016-04-24 ENCOUNTER — Telehealth: Payer: Self-pay | Admitting: Family Medicine

## 2016-04-24 NOTE — Telephone Encounter (Signed)
Patient is taking levothyroxine, and she says it seems every time she goes outside and sweats, she itches really bad, could she be allergic to the med            [phone]

## 2016-04-24 NOTE — Telephone Encounter (Signed)
I don't think so. Levothyroxine is actually thyroid hormone which is a naturally produced hormone in the human body. Therefore I believe the likelihood of her being allergic to something but naturally occurs int her body would be very low .

## 2016-04-25 NOTE — Telephone Encounter (Signed)
LMTRC

## 2016-04-28 NOTE — Telephone Encounter (Signed)
Patient aware.

## 2016-05-12 ENCOUNTER — Telehealth: Payer: Self-pay | Admitting: Pulmonary Disease

## 2016-05-12 ENCOUNTER — Other Ambulatory Visit (HOSPITAL_COMMUNITY): Payer: Self-pay | Admitting: Surgery

## 2016-05-12 NOTE — Telephone Encounter (Signed)
Spoke with the pt  She is requesting appt for surgery clearance for gastric sleeve  Appt scheduled and nothing further needed per pt

## 2016-05-20 ENCOUNTER — Ambulatory Visit (HOSPITAL_COMMUNITY)
Admission: RE | Admit: 2016-05-20 | Discharge: 2016-05-20 | Disposition: A | Discharge status: Home / Self Care | Payer: BC Managed Care – PPO | Source: Ambulatory Visit | Attending: Surgery | Admitting: Surgery

## 2016-05-20 DIAGNOSIS — K219 Gastro-esophageal reflux disease without esophagitis: Secondary | ICD-10-CM | POA: Diagnosis not present

## 2016-05-20 DIAGNOSIS — K224 Dyskinesia of esophagus: Secondary | ICD-10-CM | POA: Diagnosis not present

## 2016-05-20 DIAGNOSIS — Z9884 Bariatric surgery status: Secondary | ICD-10-CM | POA: Insufficient documentation

## 2016-06-11 ENCOUNTER — Ambulatory Visit (INDEPENDENT_AMBULATORY_CARE_PROVIDER_SITE_OTHER): Payer: BC Managed Care – PPO | Admitting: Pulmonary Disease

## 2016-06-11 ENCOUNTER — Encounter: Payer: Self-pay | Admitting: Pulmonary Disease

## 2016-06-11 VITALS — BP 128/74 | HR 52 | Temp 97.7°F | Ht 65.0 in | Wt 311.0 lb

## 2016-06-11 DIAGNOSIS — I272 Other secondary pulmonary hypertension: Secondary | ICD-10-CM

## 2016-06-11 DIAGNOSIS — G4733 Obstructive sleep apnea (adult) (pediatric): Secondary | ICD-10-CM | POA: Diagnosis not present

## 2016-06-11 DIAGNOSIS — I5022 Chronic systolic (congestive) heart failure: Secondary | ICD-10-CM

## 2016-06-11 DIAGNOSIS — I10 Essential (primary) hypertension: Secondary | ICD-10-CM

## 2016-06-11 DIAGNOSIS — I484 Atypical atrial flutter: Secondary | ICD-10-CM

## 2016-06-11 NOTE — Patient Instructions (Signed)
Today we updated your med list in our EPIC system...    Continue your current medications the same...  Today we checked your ambulatory Oxygen saturations on your O2 at 2L/min...  We will order an overnight oximetry to check your oxygen saturation on your CPAP with O2 at 3L/min flow...  We will contact you w/ the results when available & determine if this is the right amt of oxygen for your needs...  We will also contact DrFitzgerald's office for a copy of your last 2DEcho report...  I will send a [facility] regarding clerarance for the Bariatric surgery...   Call for any questions or if we can be of service in any way.Marland Kitchen..Marland Kitchen

## 2016-06-11 NOTE — Progress Notes (Signed)
Subjective:     Patient ID: Heather [name], female   DOB: July 07, 1951, 65 y.o.   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   ~  March 22, 2015:  Initial pulmonary consult by SN>        65 y/o WF, referred by DrFitzgerald for a pulmonary evaluation due to dyspnea and pulmonary hypertension per 2DEcho>  Heather [name] notes SOB/ DOE for over a year now, she has DOE w/ ADLs gradually worse over that time; she denies cough, sputum, hemoptysis; she denies CP, palpit, dizzy/syncope; she has VI w/ periph edema... She was sent here for a pulm eval due to SOB/DOE w/ any activ and a 2DEcho done 02/14/15 in [place] showing norm LV size & function w/ EF=60-65% & norm wall motion, mild MR, mod LA&RA dil, norm RVF w/ severe pulmHTN (RVSP=80); prev 2DEcho 04/13/13 showed norm LVF w/ EF=55-60%, norm wall motion, no signif valve dis, mild-mod LAdil at 43mm and PAsys=33mmHg; as far as causes for secondary pulmonaryHTN-- she has severe morbid obesity (s/p gastric banding by [facility] 2009- unsuccessful), OSA on CPAP per her PCP, mild-mod restrictive lung dis likely related to her massive obesity, and she needs additional work up to rule out other contributing factors...       Her PCP is DrPickard & DrDurham at [facility]> she is followed for Morbid Obesity & OSA on CPAP- pt reports a 3 yr hx OSA, diagnosed by DrPickard on a sleep study and treated by him w/ CPAP; the only avail Sleep Study in Epic was 03/25/12 at the [facility] on [place], [place]- it showed severe OSA w/ AHI=25/hr (52/hr in REM), and RDI=29/hr (54/hr in REM), assoc w/ oxygen desat to 76%, mod snoring, no limb movements- events noted to be worse supine & during REM;  She was supposed to have a CPAP tiration study but I can't find it in Epic, neither  can I find download data or a subseq ONO... Notes from [facility] are reviewed>   <01/11/15> c/o SOB (tight & heavy in her chest), cough, wheezing, felt to have an asthma exac- given Pred, AlbutHFA, nasonex...  <01/29/15> c/o persistent wheezing & SOB, plus water retention from the Pred, & she was given Pulmicort-2spBid...   <02/28/15> f/u visit & recent CXR said to show incr edema so they increased Lasix & referred to Cards- DrFitzgerald...      Her Cardiologist is DrFitzgerald in [place] & EP f/u w/ DrAllred in [place]> she has hx HBP, AFib first in 2004 (several cardioversions at [facility], ? back in sinus rhythm from 2007-2013 & then 6 different cardioversions 2013-2015); last note from DrAllred was 02/03/14- she had cardioversion 12/2013 w/ improved energy but then went back into her atypical AFlutter; at that visit she denied CP, palpit, SOB, and he reported no edema; since she failed several antiarrhythmic meds & DCCV, she was rec to consider another ablation w/ DrFitzgerald... Last note from DrFitzgerald in Epic was 02/01/15- f/u from another cardioversion, on Amio & dose decr from 400=>300 due to elev level, pt holding NSR...        EXAM showed Afeb, VSS, O2sat=97% on RA at rest; Wt=356#, 5'5"Tall, BMI=59;  HEENT- neg, mallampati2;  Chest- decr BS at bases, w/o w/r/r;  Heart- RR gr1/6 SEM w/o r/g;  Abd- obese, neg;  Ext- VI w/ 4+edema...  CT Angio chest 03/06/09 (done for SOB & AFib) showed no PE, mild cardiomeg, otherw neg as well...  Sleep Study in Epic was 03/25/12 at the [facility] on [place], [place]- it showed severe OSA w/ AHI=25/hr (52/hr in REM), and RDI=29/hr (54/hr in REM), assoc w/ oxygen desat to 76%, mod snoring, no limb movements- events noted to be worse supine & during REM...  Lexiscan Myoview 04/08/12 showed norm EF=60%, norm wall motion, soft tissue attenuation & no ischemia...  TEE 12/29/12 showed decr LVF w/ EF=40-45%, diffuse HK, no vegetations, no LA clot, no PFO or R=>L shunt, norm  RV...  2DEcho 04/13/13 showed norm LVF w/ EF=55-60%, norm wall motion, no signif valve dis, mild-mod LAdil at 43mm and PAsys=53mmHg...  PFT 12/16/13 showed FVC=2.16 (64%), FEV1=1.70 (66%), %1sec=79, mid-flows are reduced at 63% predicted; Lung Vols are mildly reduced (TLC at 73%); DLCO mildly reduced at 74% predicted (VA is similarly reduced)...   CXR 01/30/15 showed cardiomegaly & prominent central pulm vascularity, mild incr in interstitial markings, no effusion, NAD...  2DEcho 02/14/15 [facility] showed norm LV size & function w/ EF=60-65% & norm wall motion, mild MR, mod LA&RA dil, norm RVF w/ severe pulmHTN (RVSP=80)...  Ambulatory oxygen saturation test 03/22/15> O2sat=97% on RA at rest w/ pulse=54/min; she walked only 1 lap in the office & stopped due to fatigue & SOB- lowest O2sat=86% w/ pulse=83/min...  LABS 5-03/2015:  [place]ms- ok x BS=128, Cr=1.21, A1c=6.1;  CBC- Hg=11.2, MCV=86;  BNP=146;  TSH=9.71 (FreeT3=2.4, FreeT4=1.02)... Note Amio level 01/26/15=3.5 (1.0-2.5).  IMP/PLAN>>  Heather [name] has severe SOB/DOE w/ ADLs and recent 2DEcho in [place] showing severe incr PAsys=86mmHg;  The likely etiology of secondary pulmHTN is her severe morbid obesity (BMI=59), severe OSA & hypoxemia (see above);  She had Bariatric surg by [facility] 2009 & Epic shows f/u visits for 52yr but none since 2010 & she clearly didn't loose any weight (I will call [facility] for details and to consider further options- ?gastric sleeve?);  She indicates that DrPickard started CPAP (she doesn't know the settings) & I cannot find orders or download data in Epic (we will call [facility] for data);  Ambulatory O2 sat here today dropped to 86% on RA after one lap- she needs O2 w/ exercise and needs an ONO as well once we see the download data regarding compliance; consider ABG to r/o OHS... Improving her PA pressure is likely going to depend on appropriate CPAP management, relieving hypoxemia, and losing weight...   ADDENDUM>>  Started on  Home oxygen at 2L/min [place] => 24/7 due to her severe pulmHTN ADDENDUM>> We received download data from [facility] ([employer] w/ humidifier)- good compliance w/ CPAP: 90/90 days (Apr-Jun2016), 7-8hrs per night, CPAP=11, AHI on CPAP=1.5.Marland KitchenMarland Kitchen ADDENDUM>>  ONO on CPAP, on O2 at 2L/min => pending (it was done 04/05/15 ON ROOM AIR w/ desat to 74%, <88% for 3hrs) ADDENDUM>>  04/12/15- she saw [facility] in [facility] office- note reviewed, wt=363#, BMI=59.5, they added 0.5cc to lap-band (total 5.0cc), reviewed diet/ nutrition consult etc & goal <300# or consider additional surg. ADDENDUM>>  04/17/15- ONO on O2 at 2L/min & CPAP showed she was still desaturating to <88% for 49.58min & Desat events <88%=41 (Desat index of 11/hr); WE WILL INCR NOCTURNAL O2 to 3L/min   ~  June 11, 2016:  43mo ROV & pulmonary follow up requested by [facility] for surgical clearance>  After initial pulmonary consult 03/22/15 pt started  on O2 and had the indicated CPAP download and ONO data but never returned to our office for recheck;  She was last increased to 3L/min added to her CPAP for the nocturnal hypoxemia and is now in need of another ONO to prove that this is adeq to keep her O2sats >90%, and she needs a 2DEcho to reassess her PAsys pressures... DrNewman is planning further Bariatiric surg in the near future & needs pulmonary surgical clearance...     She saw DrFitzgerald, [facility] 06/03/16 & note reviewed in Care Everywhere>  HBP, PAF on Amio & s/p mult ablation procedures, OSA, PulmHTN- on Eliquis5Bid, Amio200, Metop25Bid, CardizemCD120, Losartan100, Lasix20-2Qam; she is holding NSR and cleared for bariatric surg by [facility];  NOTE-- Care Everywhere records indicate that she had a f/u 2DEcho [id number] but the results are NOT avail & not referenced in any of DrFitzgerald's cardiac notes (we need f/u 2DEcho to re-assess her est PAsys pressure while on adeq O2 supplementation...     From the pulmonary standpoint Heather [name] feels improved- less SOB but still w/ DOE  after walking "long distances", she is again singing in the choir but needs her oxygen; she notes sl cough, denies sput production/ hemoptysis/ CP etc; We reviewed the following medical problems during today's office visit >>     Severe DYSPNEA>  She reports sl improved w/ wt loss so far & home oxygen therapy...    Nocturnal & exercise hypoxemia>  She desats to 86% on RA w/ min walking & signif O2 desats Qhs despite CPAP=> O2 incr to 3L/min bled into machine.    Pulmonary HTN>  2DEcho in [place] by DrFitagerald 5/16 indicated PAsys~80 (in 2014 it was ~34)...    OSA>  Initial sleep study by DrPickard & CPAP prescribed & followed by him; CPAP download from spring2016 showed AHI=1.5 on CPAP 11...    Morbid Obesity> Wt=356 lbs in Jun2016 & she is down to 311 lbs today; Heather [name] is planning to convert her Lap band to Sleeve gastrectomy soon...    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    HBP>  On Metop25Bid, CardizenCD120, Losar100, Lasix40 & BP=128/74...    PAF>  On Eliquis5Bid & Amio200; she has had numerous ablation procedures; now holding NSR on the amio...    Ven Insuffic/ Edema>  On Lasix40 & the low salt diet...    Hypothyroid>  On Synthroid100 w/ TSH followed by DrPickard... EXAM shows pear-shaped body habitus, Afeb, VSS, O2sat=99% on RA at rest; Wt=311#, 5'5"Tall, BMI=52;  HEENT- neg, mallampati2;  Chest- decr BS at bases, w/o w/r/r;  Heart- RR gr1/6 SEM w/o r/g;  Abd- obese, neg;  Ext- VI w/ 1+edema...  Ambulatory Oximetry on 2L/min [place]>  O2sat=100% on 2L/min at rest w/ pulse=58/min;  She was able to ambulate just 1Lap (185') on the 2L O2 w/ lowest sat=100& w/ pulse=88/min... IMP/PLAN>>  We need to obtain an ONO on her 3L/min flow to prove that she is no longer desaturating at night on her CPAP; it would also be instructive for a repeat 2DEcho to recheck her PAsys est pressure; she is OK for the surgery which we hope will be life saving given  these life threatening complications from her morbid obesity...   ADDENDUM>>  06/30/16- ONO on O2 at 3L/min & her CPAP showed NO DESATURATIONS- all O2sat>90% on 3L/min SHE STILL NEEDS A FOLLOW UP 2DEcho TO RECHECK HER ESTIMATED PAsys PRESSURE and assess benefit from the O2  Past Medical History:  Diagnosis Date  . Anemia    "onc;e; had to take iron for awhile"  . Atrial fibrillation (HCC)    Began in 2004.  [facility].  She is off coumadin  now with no noted recurrent atrial fibrillation since 2007. til 04/01/12; s/p initiation of Rhythmol with DCCV June 2013  . Carpal tunnel syndrome   . Degenerative disk disease   . GERD (gastroesophageal reflux disease)    resolved after lap band  . History of blood transfusion    "w/both hip replacements"  . HTN (hypertension)   . Hypothyroidism    no meds currently  . IBS (irritable bowel syndrome)   . Interstitial cystitis   . Morton's neuroma    right foot  . Obesity   . Shortness of breath   . Sleep apnea 04/01/12   "dx'd just last week"  . Sleep apnea    uses CPAP, 11 CWP with residual AHI 6.3    Past Surgical History:  Procedure Laterality Date  . ANTERIOR AND POSTERIOR REPAIR  10/20/2012   Procedure: ANTERIOR (CYSTOCELE) AND POSTERIOR REPAIR (RECTOCELE);  Surgeon: Esmeralda ArthurSandra A Rivard, MD;  Location: [facility];  Service: Gynecology;  Laterality: N/A;  2 hours  . ATRIAL FIBRILLATION ABLATION N/A 12/30/2012   Procedure: ATRIAL FIBRILLATION ABLATION;  Surgeon: Hillis RangeJames Allred, MD;  [facility];  Service: Cardiovascular;  Laterality: N/A;  . BACK SURGERY    . CARDIAC CATHETERIZATION    . CARDIAC ELECTROPHYSIOLOGY MAPPING AND ABLATION  ~ 2005 and 2007   "[facility] 2nd time; [facility]"  . CARDIOVERSION  04/02/2012   Procedure: CARDIOVERSION;  Surgeon: Laurey Moralealton S McLean, MD;  [facility];  Service: Cardiovascular;  Laterality: N/A;  . CARDIOVERSION  06/16/2012   Procedure: CARDIOVERSION;   Surgeon: Vesta MixerPhilip J Nahser, MD;  [facility];  Service: Cardiovascular;  Laterality: N/A;  amanda/ebp/Beverly( or scheduling)  . CARDIOVERSION  10/29/2012   Procedure: CARDIOVERSION;  Surgeon: Laurey Moralealton S McLean, MD;  [facility];  Service: Cardiovascular;  Laterality: N/A;  . CARDIOVERSION N/A 03/30/2013   Procedure: CARDIOVERSION;  Surgeon: Laurey Moralealton S McLean, MD;  [facility];  Service: Cardiovascular;  Laterality: N/A;  . CARDIOVERSION N/A 12/23/2013   Procedure: CARDIOVERSION;  Surgeon: Lars MassonKatarina H Nelson, MD;  [facility];  Service: Cardiovascular;  Laterality: N/A;  9:27 Propofol 70mg , IV   150 joules synched shock by Dr. Delton SeeNelson @ 150 joules...SR   post 12 lead ordered.   . CARPAL TUNNEL RELEASE  1990's   bilaterally  . DILATION AND CURETTAGE OF UTERUS  2000  . JOINT REPLACEMENT     bilateral hips  . LAPAROSCOPIC GASTRIC BANDING  2009  . LASIK    . POSTERIOR FUSION LUMBAR SPINE  2000's   "nerve problems"  . POSTERIOR FUSION LUMBAR SPINE  2000's  . TEE WITHOUT CARDIOVERSION N/A 12/29/2012   Procedure: TRANSESOPHAGEAL ECHOCARDIOGRAM (TEE);  Surgeon: Peter M SwazilandJordan, MD;  [facility];  Service: Cardiovascular;  Laterality: N/A;  . TONSILLECTOMY AND ADENOIDECTOMY  1957  . TOTAL HIP ARTHROPLASTY  ~2009; 2010   right; left  . TUBAL LIGATION  1980  . VAGINAL HYSTERECTOMY  2000    Outpatient Encounter Prescriptions as of 06/11/2016  Medication Sig  . albuterol (PROVENTIL HFA;VENTOLIN HFA) 108 (90 BASE) MCG/ACT inhaler Inhale 2 puffs into the lungs every 6 (six) hours as needed for wheezing or shortness of breath.  .Marland Kitchen  amiodarone (PACERONE) 200 MG tablet Take 1 tablet (200 mg total) by mouth daily.  Marland Kitchen amitriptyline (ELAVIL) 10 MG tablet Take 1 tablet by mouth at  bedtime  . cetirizine (ZYRTEC) 10 MG tablet Take 10 mg by mouth daily.  . Cholecalciferol (VITAMIN D) 2000 UNITS tablet Take 2,000 Units by mouth daily.  Marland Kitchen diltiazem (CARDIZEM CD) 120 MG  24 hr capsule take 1 capsule daily  . ELIQUIS 5 MG TABS tablet TAKE 1 TABLET BY MOUTH TWICE A DAY....NEED OFFICE VISIT BEFORE ANY MORE REFILLS  . furosemide (LASIX) 20 MG tablet Take 40 mg by mouth daily.  Marland Kitchen guaiFENesin (MUCINEX) 600 MG 12 hr tablet Take 600 mg by mouth 2 (two) times daily as needed. For allergies.  Marland Kitchen levothyroxine (SYNTHROID, LEVOTHROID) 50 MCG tablet Take 1 tablet by mouth  daily before breakfast  . losartan (COZAAR) 100 MG tablet Take 1 tablet by mouth  daily  . metoprolol tartrate (LOPRESSOR) 25 MG tablet Take 25 mg by mouth 2 (two) times daily.   . [DISCONTINUED] Budesonide 90 MCG/ACT inhaler Inhale 1 puff into the lungs 2 (two) times daily.  . [DISCONTINUED] chlorpheniramine-HYDROcodone (TUSSIONEX PENNKINETIC ER) 10-8 MG/5ML SUER Take 5 mLs by mouth every 12 (twelve) hours as needed for cough. (Patient not taking: Reported on 06/11/2016)  . [DISCONTINUED] doxycycline (VIBRA-TABS) 100 MG tablet Take 1 tablet (100 mg total) by mouth 2 (two) times daily. (Patient not taking: Reported on 06/11/2016)  . [DISCONTINUED] predniSONE (DELTASONE) 20 MG tablet Take 2 daily for 5 days. (Patient not taking: Reported on 06/11/2016)   No facility-administered encounter medications on file as of 06/11/2016.     Allergies  Allergen Reactions  . Cephalexin Hives and Other (See Comments)    "throat started closing up"  . Rocephin [Ceftriaxone Sodium In Dextrose] Anaphylaxis  . Penicillins Rash  . Flecainide Acetate Other (See Comments)    "couldn't take it"  . Moxifloxacin     Does not remember the reaction  . Sulfonamide Derivatives     Does not remember reaction ; "was so young when I had reaction to it"    Immunization History  Administered Date(s) Administered  . Influenza-Unspecified 11/04/2012, 08/24/2014  . Zoster 03/24/2013    Current Medications, Allergies, Past Medical History, Past Surgical History, Family History, and Social History were reviewed in [employer] record.   Review of Systems             All symptoms NEG except where BOLDED >>  Constitutional:  F/C/S, fatigue, anorexia, unexpected weight change. HEENT:  HA, visual changes, hearing loss, earache, nasal symptoms, sore throat, mouth sores, hoarseness. Resp:  cough, sputum, hemoptysis; SOB, tightness, wheezing. Cardio:  CP, palpit, DOE, orthopnea, edema. GI:  N/V/D/C, blood in stool; reflux, abd pain, distention, gas. GU:  dysuria, freq, urgency, hematuria, flank pain, voiding difficulty. MS:  joint pain, swelling, tenderness, decr ROM; neck pain, back pain, etc. Neuro:  HA, tremors, seizures, dizziness, syncope, weakness, numbness, gait abn. Skin:  suspicious lesions or skin rash. Heme:  adenopathy, bruising, bleeding. Psyche:  confusion, agitation, sleep disturbance, hallucinations, anxiety, depression suicidal.   Objective:   Physical Exam       Vital Signs:  Reviewed...   General:  WD, morbidly obese, 65 y/o WF in NAD; alert & oriented; pleasant & cooperative... HEENT:  [place]/AT; Conjunctiva- pink, Sclera- nonicteric, EOM-wnl, PERRLA, EACs-clear, TMs-wnl; NOSE-clear; THROAT-clear & wnl.  Neck:  Supple w/ decr ROM; no JVD; normal carotid impulses w/o bruits; no  thyromegaly or nodules palpated; no lymphadenopathy.  Chest:  decr BS bilat at bases, but clear without wheezes, rales, or rhonchi heard. Heart:  Regular Rhythm; gr1/6SEM without rubs or gallops detected. Abdomen:  Obese, soft & nontender- no guarding or rebound; normal bowel sounds; no organomegaly or masses palpated. Ext:  decrROM; +arthritic changes; no varicose veins, +venous insuffic, 2+ edema;  Pulses intact w/o bruits. Neuro:  No focal neuro deficits, +gait abnormality & balance fair... Derm:  No lesions noted; no rash etc. Lymph:  No cervical, supraclavicular, axillary, or inguinal adenopathy palpated.   Assessment:      IMP >>     Severe DYSPNEA>  She reports sl improved w/ wt loss so far  & home oxygen therapy...    Nocturnal & exercise hypoxemia>  She desats to 86% on RA w/ min walking & signif O2 desats Qhs despite CPAP=> O2 incr to 3L/min bled into machine.    Pulmonary HTN>  2DEcho in [place] by DrFitagerald 5/16 indicated PAsys~80 (in 2014 it was ~34)...    OSA>  Initial sleep study by DrPickard & CPAP prescribed & followed by him; CPAP download from spring2016 showed AHI=1.5 on CPAP 11...    Morbid Obesity> Wt=356 lbs in Jun2016 & she is down to 311 lbs today; Heather [name] is planning to convert her Lap band to Sleeve gastrectomy soon...    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    HBP>  On Metop25Bid, CardizenCD120, Losar100, Lasix40 & BP=128/74...    PAF>  On Eliquis5Bid & Amio200; she has had numerous ablation procedures; now holding NSR on the amio...    Ven Insuffic/ Edema>  On Lasix40 & the low salt diet...    Hypothyroid>  On Synthroid100 w/ TSH followed by DrPickard...  PLAN >>  03/22/15>   Heather [name] has severe SOB/DOE w/ ADLs and recent 2DEcho in [place] showing severe incr PAsys=80mmHg;  The likely etiology of secondary pulmHTN is her severe morbid obesity (BMI=59), severe OSA & hypoxemia (see above);  She had Bariatric surg by [facility] 2009 & Epic shows f/u visits for 44yr but none since 2010 & she clearly didn't loose any weight (I will call [facility] for details and to consider further options- ?gastric sleeve?);  She indicates that DrPickard started CPAP (she doesn't know the settings) & I cannot find orders or download data in Epic (we will call [facility] for data);  Ambulatory O2 sat here today dropped to 86% on RA after one lap- she needs O2 w/ exercise and needs an ONO as well once we see the download data regarding compliance; consider ABG to r/o OHS... Improving her PA pressure is likely going to depend on appropriate CPAP management, relieving hypoxemia, and losing weight. 06/11/16>   We need to obtain an ONO on her 3L/min flow  to prove that she is no longer desaturating at night on her CPAP; it would also be instructive for a repeat 2DEcho to recheck her PAsys est pressure; she is OK for the surgery which we hope will be life saving given these life threatening complications from her morbid obesity.      Plan:     Patient's Medications  New Prescriptions   No medications on file  Previous Medications   ALBUTEROL (PROVENTIL HFA;VENTOLIN HFA) 108 (90 BASE) MCG/ACT INHALER    Inhale 2 puffs into the lungs every 6 (six) hours as needed for wheezing or shortness of breath.   AMIODARONE (PACERONE) 200 MG TABLET  Take 1 tablet (200 mg total) by mouth daily.   AMITRIPTYLINE (ELAVIL) 10 MG TABLET    Take 1 tablet by mouth at  bedtime   CETIRIZINE (ZYRTEC) 10 MG TABLET    Take 10 mg by mouth daily.   CHOLECALCIFEROL (VITAMIN D) 2000 UNITS TABLET    Take 2,000 Units by mouth daily.   DILTIAZEM (CARDIZEM CD) 120 MG 24 HR CAPSULE    take 1 capsule daily   ELIQUIS 5 MG TABS TABLET    TAKE 1 TABLET BY MOUTH TWICE A DAY....NEED OFFICE VISIT BEFORE ANY MORE REFILLS   FUROSEMIDE (LASIX) 20 MG TABLET    Take 40 mg by mouth daily.   GUAIFENESIN (MUCINEX) 600 MG 12 HR TABLET    Take 600 mg by mouth 2 (two) times daily as needed. For allergies.   LEVOTHYROXINE (SYNTHROID, LEVOTHROID) 50 MCG TABLET    Take 1 tablet by mouth  daily before breakfast   LOSARTAN (COZAAR) 100 MG TABLET    Take 1 tablet by mouth  daily   METOPROLOL TARTRATE (LOPRESSOR) 25 MG TABLET    Take 25 mg by mouth 2 (two) times daily.   Modified Medications   No medications on file  Discontinued Medications   BUDESONIDE 90 MCG/ACT INHALER    Inhale 1 puff into the lungs 2 (two) times daily.   CHLORPHENIRAMINE-HYDROCODONE (TUSSIONEX PENNKINETIC ER) 10-8 MG/5ML SUER    Take 5 mLs by mouth every 12 (twelve) hours as needed for cough.   DOXYCYCLINE (VIBRA-TABS) 100 MG TABLET    Take 1 tablet (100 mg total) by mouth 2 (two) times daily.   PREDNISONE (DELTASONE) 20 MG  TABLET    Take 2 daily for 5 days.

## 2016-06-19 ENCOUNTER — Ambulatory Visit (INDEPENDENT_AMBULATORY_CARE_PROVIDER_SITE_OTHER): Payer: BC Managed Care – PPO | Admitting: Psychiatry

## 2016-07-08 ENCOUNTER — Telehealth: Payer: Self-pay | Admitting: Pulmonary Disease

## 2016-07-08 NOTE — Telephone Encounter (Signed)
Per SN: Pts ONO on 9.18.17 showed O2 sats >90% on 3lpm bled into CPAP, continue the same.   Called and spoke to pt. Informed her of the results and recs per SN. Pt verbalized understanding and denied any further questions or concerns at this time.

## 2016-07-09 ENCOUNTER — Ambulatory Visit: Payer: BC Managed Care – PPO | Admitting: Psychiatry

## 2016-07-22 ENCOUNTER — Encounter: Payer: Self-pay | Admitting: Pulmonary Disease

## 2016-07-23 ENCOUNTER — Other Ambulatory Visit: Payer: Self-pay | Admitting: Family Medicine

## 2016-08-22 ENCOUNTER — Encounter: Payer: BC Managed Care – PPO | Attending: Surgery | Admitting: Dietician

## 2016-08-22 ENCOUNTER — Encounter: Payer: Self-pay | Admitting: Dietician

## 2016-08-22 DIAGNOSIS — E669 Obesity, unspecified: Secondary | ICD-10-CM | POA: Diagnosis present

## 2016-08-22 NOTE — Progress Notes (Signed)
  Pre-Op Assessment Visit:  Pre-Operative Sleeve gastrectomy Surgery  Medical Nutrition Therapy:  Appt start time: 0800   End time:  0835  Patient was seen on 08/22/2016 for Pre-Operative Nutrition Assessment. Assessment and letter of approval fax[facility] to [facility] coordinator on 08/22/2016.   Preferred Learning Style:   No preference indicated   Learning Readiness:   Ready  Handouts given during visit include:  Pre-Op Goals Bariatric Surgery Protein Shakes   During the appointment today the following Pre-Op Goals were reviewed with the patient: Maintain or lose weight as instructed by your surgeon Make healthy food choices Begin to limit portion sizes Limited concentrated sugars and fried foods Keep fat/sugar in the single digits per serving on   food labels Practice CHEWING your food  (aim for 30 chews per bite or until applesauce consistency) Practice not drinking 15 minutes before, during, and 30 minutes after each meal/snack Avoid all carbonated beverages  Avoid/limit caffeinated beverages  Avoid all sugar-sweetened beverages Consume 3 meals per day; eat every 3-5 hours Make a list of non-food related activities Aim for 64-100 ounces of FLUID daily  Aim for at least 60-80 grams of PROTEIN daily Look for a liquid protein source that contain ?15 g protein and ?5 g carbohydrate  (ex: shakes, drinks, shots)  Demonstrated degree of understanding via:  Teach Back  Teaching Method Utilized:  Visual Auditory Hands on  Barriers to learning/adherence to lifestyle change: pulmonary limitations  Patient to call the [facility] to enroll in Pre-Op and Post-Op Nutrition Education when surgery date is scheduled.

## 2016-10-22 ENCOUNTER — Ambulatory Visit (INDEPENDENT_AMBULATORY_CARE_PROVIDER_SITE_OTHER): Payer: BC Managed Care – PPO | Admitting: Physician Assistant

## 2016-10-22 ENCOUNTER — Encounter (INDEPENDENT_AMBULATORY_CARE_PROVIDER_SITE_OTHER): Payer: Self-pay

## 2016-10-22 ENCOUNTER — Encounter (INDEPENDENT_AMBULATORY_CARE_PROVIDER_SITE_OTHER): Payer: Self-pay | Admitting: Physician Assistant

## 2016-10-22 ENCOUNTER — Ambulatory Visit (INDEPENDENT_AMBULATORY_CARE_PROVIDER_SITE_OTHER): Payer: Self-pay

## 2016-10-22 DIAGNOSIS — M25561 Pain in right knee: Secondary | ICD-10-CM

## 2016-10-22 MED ORDER — LIDOCAINE HCL 1 % IJ SOLN
3.0000 mL | INTRAMUSCULAR | Status: AC | PRN
  Administered 2016-10-22: 3 mL

## 2016-10-22 MED ORDER — METHYLPREDNISOLONE ACETATE 40 MG/ML IJ SUSP
40.0000 mg | INTRAMUSCULAR | Status: AC | PRN
  Administered 2016-10-22: 40 mg via INTRA_ARTICULAR

## 2016-10-22 NOTE — Progress Notes (Signed)
Office Visit Note   Patient: Heather [name]           Date of Birth: 1951-03-11           MRN: [id number] Visit Date: 10/22/2016              Requested by: Heather Brooks, MD [address] [facility] [address] [place], [place] [address] PCP: Heather Grosser, MD   Assessment & Plan: Visit Diagnoses:  1. Acute pain of right knee     Plan: See her back in 2 weeks to check and see how well she is done with the injection. Have her follow with Heather [name] and she can discuss possible right thumb [facility] joint  Arthroplasty.  Follow-Up Instructions: Return in about 2 weeks (around 11/05/2016) for Heather [name] .   Orders:  Orders Placed This Encounter  Procedures  . Large Joint Injection/Arthrocentesis  . XR Knee 1-2 Views Right   No orders of the defined types were placed in this encounter.     Procedures: Large Joint Inj Date/Time: 10/22/2016 5:13 PM Performed by: Heather [name] Authorized by: Heather [name]   Consent Given by:  Patient Indications:  Pain Location:  Knee Site:  R knee Needle Size:  22 G Approach:  Anterolateral Ultrasound Guidance: No   Fluoroscopic Guidance: No   Medications:  40 mg methylPREDNISolone acetate 40 MG/ML; 3 mL lidocaine 1 % Aspiration Attempted: No   Patient tolerance:  Patient tolerated the procedure well with no immediate complications     Clinical Data: No additional findings.   Subjective: Chief Complaint  Patient presents with  . Right Knee - Pain    HPI  Heather [name] returns today with right knee pain. She is concerned she may have a meniscal tears it feels like her left knee didn't whenever she had a meniscal tear. [name] been ongoing since [place] eve she had no fall or particular injury. At the pain after raise her leg up to get into a [employer]. She's tried NSAIDs without much relief. She is also having increased pain in her right thumb has known [facility] joint arthritis she states she is ready to proceed with a [facility] joint  arthroplasty  Review of Systems   Objective: Vital Signs: There were no vitals taken for this visit.  Physical Exam  Ortho Exam Right knee she has full extension flexion to 105. She has global tenderness about the knee. Her knee is somewhat large in nature secondary to her weight I do not appreciate an effusion. No abnormal warmth erythema. No instability with valgus varus stressing  Right thumb positive grind test tenderness over the [facility] joint .negative Lourena Simmonds  Specialty Comments:  No specialty comments available.  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 No acute fracture.    PMFS History: Patient Active Problem List   Diagnosis Date Noted  . Pulmonary hypertension 03/22/2015  . Sleep apnea   . Atypical atrial flutter (HCC) 11/27/2013  . Chronic systolic CHF (congestive heart failure) (HCC) 03/29/2013  . History of laparoscopic adjustable gastric banding, APS, 05/09/2008 12/22/2012  . Fatigue 11/23/2012  . Thyroid disease 09/24/2012  . Degenerative disc disease, lumbar 09/24/2012  . Morbid obesity (HCC) 07/30/2012  . Chronic anticoagulation 04/01/2012  . OSA (obstructive sleep apnea) 04/01/2012  . Atrial fibrillation (HCC) 03/24/2012  . Decreased libido 03/01/2012  . Carpal  tunnel syndrome   . Essential hypertension 06/20/2010  . Shortness of breath 12/13/2009   Past Medical History:  Diagnosis Date  . Anemia    "onc;e; had to take iron for awhile"  . Atrial fibrillation (HCC)    Began in 2004.  Had ablations at [facility] in 2005 and 2007.  She is off coumadin  now with no noted recurrent atrial fibrillation since 2007. til 04/01/12; s/p initiation of Rhythmol with DCCV June 2013  . Carpal tunnel syndrome   . Degenerative disk disease   . GERD (gastroesophageal reflux disease)    resolved after lap band  . History of blood  transfusion    "w/both hip replacements"  . HTN (hypertension)   . Hypothyroidism    no meds currently  . IBS (irritable bowel syndrome)   . Interstitial cystitis   . Morton's neuroma    right foot  . Obesity   . Shortness of breath   . Sleep apnea 04/01/12   "dx'd just last week"  . Sleep apnea    uses CPAP, 11 CWP with residual AHI 6.3    Family History  Problem Relation Age of Onset  . Hypertension Father   . Diabetes Father   . Dementia Father   . Atrial fibrillation Mother   . Diabetes Sister   . Diabetes Brother   . Heart disease Maternal Grandmother   . Heart attack      granfather    Past Surgical History:  Procedure Laterality Date  . ANTERIOR AND POSTERIOR REPAIR  10/20/2012   Procedure: ANTERIOR (CYSTOCELE) AND POSTERIOR REPAIR (RECTOCELE);  Surgeon: Heather Arthur, MD;  Location: [facility];  Service: Gynecology;  Laterality: N/A;  2 hours  . ATRIAL FIBRILLATION ABLATION N/A 12/30/2012   Procedure: ATRIAL FIBRILLATION ABLATION;  Surgeon: Heather Range, MD;  Location: [facility] CATH LAB;  Service: Cardiovascular;  Laterality: N/A;  . BACK SURGERY    . CARDIAC CATHETERIZATION    . CARDIAC ELECTROPHYSIOLOGY MAPPING AND ABLATION  ~ 2005 and 2007   "did more 2nd time; both at [facility]"  . CARDIOVERSION  04/02/2012   Procedure: CARDIOVERSION;  Surgeon: Heather Morale, MD;  [facility] OR;  Service: Cardiovascular;  Laterality: N/A;  . CARDIOVERSION  06/16/2012   Procedure: CARDIOVERSION;  Surgeon: Heather Mixer, MD;  Location: [facility] ENDOSCOPY;  Service: Cardiovascular;  Laterality: N/A;  Heather [name]/ebp/Heather [name]( or scheduling)  . CARDIOVERSION  10/29/2012   Procedure: CARDIOVERSION;  Surgeon: Heather Morale, MD;  Location: [facility] ENDOSCOPY;  Service: Cardiovascular;  Laterality: N/A;  . CARDIOVERSION N/A 03/30/2013   Procedure: CARDIOVERSION;  Surgeon: Heather Morale, MD;  Location: [facility] ENDOSCOPY;  Service: Cardiovascular;  Laterality: N/A;  . CARDIOVERSION N/A 12/23/2013    Procedure: CARDIOVERSION;  Surgeon: Lars Masson, MD;  Location: [facility] ENDOSCOPY;  Service: Cardiovascular;  Laterality: N/A;  9:27 Propofol 70mg , IV   150 joules synched shock by Dr. Delton See @ 150 joules...SR   post 12 lead ordered.   . CARPAL TUNNEL RELEASE  1990's   bilaterally  . DILATION AND CURETTAGE OF UTERUS  2000  . JOINT REPLACEMENT     bilateral hips  . LAPAROSCOPIC GASTRIC BANDING  2009  . LASIK    . POSTERIOR FUSION LUMBAR SPINE  2000's   "nerve problems"  . POSTERIOR FUSION LUMBAR SPINE  2000's  . TEE WITHOUT CARDIOVERSION N/A 12/29/2012   Procedure: TRANSESOPHAGEAL ECHOCARDIOGRAM (TEE);  Surgeon: Peter M Swaziland, MD;  Location: [facility] ENDOSCOPY;  Service: Cardiovascular;  Laterality: N/A;  . TONSILLECTOMY AND ADENOIDECTOMY  1957  . TOTAL HIP ARTHROPLASTY  ~2009; 2010   right; left  . TUBAL LIGATION  1980  . VAGINAL HYSTERECTOMY  2000   Social History   Occupational History  . Not on file.   Social History Main Topics  . Smoking status: Never Smoker  . Smokeless tobacco: Never Used  . Alcohol use No  . Drug use: No  . Sexual activity: Yes     Comment: LAVH

## 2016-10-25 ENCOUNTER — Telehealth (INDEPENDENT_AMBULATORY_CARE_PROVIDER_SITE_OTHER): Payer: Self-pay | Admitting: Orthopedic Surgery

## 2016-10-27 ENCOUNTER — Telehealth (INDEPENDENT_AMBULATORY_CARE_PROVIDER_SITE_OTHER): Payer: Self-pay

## 2016-10-27 NOTE — Telephone Encounter (Signed)
She saw Bronson CurbGil on 1/10 and he injected her knee.  If she doesn't improve when we see her next visit, then we will oder a MRI of that knee

## 2016-10-27 NOTE — Telephone Encounter (Signed)
I gave patient the information, she states knee hurts so bad she can't really put pressure on the knee I told her to maybe try your suggestion and see if that helps.

## 2016-10-27 NOTE — Telephone Encounter (Signed)
Patient called and is having pain in the right knee. States she got an injection 10/22/16. She called over the weekend and states Dr August Saucerean prescribed Rx (Tylenol #3) would like to know what else she can do.  CB  [phone]

## 2016-10-27 NOTE — Telephone Encounter (Signed)
Aleve twice daily, over the counter Tumeric, and ice/heat/rest for now

## 2016-10-27 NOTE — Telephone Encounter (Signed)
Please advise 

## 2016-10-28 NOTE — Telephone Encounter (Signed)
Can you give this message to her

## 2016-10-28 NOTE — Telephone Encounter (Signed)
Talked with patient and advised her of message concerning her knee per Heather [name].  Patient stated that if knee gets any worse, she will call and make an earlier appointment.

## 2016-10-31 ENCOUNTER — Encounter (INDEPENDENT_AMBULATORY_CARE_PROVIDER_SITE_OTHER): Payer: Self-pay | Admitting: Orthopedic Surgery

## 2016-10-31 ENCOUNTER — Ambulatory Visit (INDEPENDENT_AMBULATORY_CARE_PROVIDER_SITE_OTHER): Payer: BC Managed Care – PPO | Admitting: Orthopedic Surgery

## 2016-10-31 ENCOUNTER — Telehealth (INDEPENDENT_AMBULATORY_CARE_PROVIDER_SITE_OTHER): Payer: Self-pay

## 2016-10-31 ENCOUNTER — Ambulatory Visit (INDEPENDENT_AMBULATORY_CARE_PROVIDER_SITE_OTHER): Payer: Self-pay

## 2016-10-31 DIAGNOSIS — M25561 Pain in right knee: Secondary | ICD-10-CM

## 2016-10-31 DIAGNOSIS — M25461 Effusion, right knee: Secondary | ICD-10-CM | POA: Diagnosis not present

## 2016-10-31 LAB — CBC WITH DIFFERENTIAL/PLATELET
BASOS PCT: 0 %
Basophils Absolute: 0 cells/uL (ref 0–200)
EOS PCT: 1 %
Eosinophils Absolute: 93 cells/uL (ref 15–500)
HCT: 37.6 % (ref 35.0–45.0)
Hemoglobin: 12 g/dL (ref 11.7–15.5)
LYMPHS PCT: 9 %
Lymphs Abs: 837 cells/uL — ABNORMAL LOW (ref 850–3900)
MCH: 28.2 pg (ref 27.0–33.0)
MCHC: 31.9 g/dL — AB (ref 32.0–36.0)
MCV: 88.5 fL (ref 80.0–100.0)
MONO ABS: 837 {cells}/uL (ref 200–950)
MPV: 10.8 fL (ref 7.5–12.5)
Monocytes Relative: 9 %
NEUTROS PCT: 81 %
Neutro Abs: 7533 cells/uL (ref 1500–7800)
PLATELETS: 334 10*3/uL (ref 140–400)
RBC: 4.25 MIL/uL (ref 3.80–5.10)
RDW: 13.9 % (ref 11.0–15.0)
WBC: 9.3 10*3/uL (ref 3.8–10.8)

## 2016-10-31 NOTE — Telephone Encounter (Signed)
Patient called this morning and LM in [place] phone states she is  in pain in her right  knee. She states Bronson CurbGil gave her a right knee injection and the pain is getting worse and worse. She states she did what Dr Magnus IvanBlackman advised and has not helped any. Would like to know if she needs to do something else or if she needs to be seen instead. She would like a CB.   [phone]

## 2016-10-31 NOTE — Progress Notes (Signed)
Office Visit Note   Patient: Heather [name]           Date of Birth: [birth year]           MRN: [id number] Visit Date: 10/31/2016 Requested by: Susy Frizzle, MD [address] [facility] [place], [place] [address] PCP: Odette Fraction, MD  Subjective: Chief Complaint  Patient presents with  . Right Knee - Pain    HPI since he is a 66 year old patient with severe right knee pain.  I talked with her last week and this pain began after she had an injection done into the knee.  She now describes severe pain for the past 10 days.  She is describing some redness in the anterior aspect of the knee.. The patient has been taking Tylenol 3 which has been helping some.  She also describes pain in the calf and thigh region.  She is on Eliquis.  No family history of DVT or pulmonary embolism.  She does feel like the leg is swollen.  She has not been as active as she has been in the past since this injection.              Review of Systems All systems reviewed are negative as they relate to the chief complaint within the history of present illness.  Patient denies  fevers or chills.    Assessment & Plan: Visit Diagnoses:  1. Acute pain of right knee   2. Swelling of right knee joint     Plan: Impression is right knee pain unclear etiology with some swelling in the leg.  She is not really having any systemic symptoms of infection.  The redness she describes on the anterior aspect of her knee is really just redness from the skin folds around the knee.  I'll detect much warmth in the knee but it's really a difficult exam due to the soft tissue envelope.  She does have a little bit of swelling in the right leg.  Although would be uncommon I do want to get an ultrasound to rule out DVT even though the patient is on Eliquis.  In addition like to draw some blood work just to get a baseline in infection parameters recorded.  She would like to see Dr. Ninfa Linden tomorrow for further evaluation which will be  arranged  Follow-Up Instructions: Return for blackman tomorrow.   Orders:  Orders Placed This Encounter  Procedures  . XR KNEE 3 VIEW RIGHT  . CBC w/Diff/Platelet  . Sed Rate (ESR)  . C-reactive protein   No orders of the defined types were placed in this encounter.     Procedures: No procedures performed   Clinical Data: No additional findings.  Objective: Vital Signs: There were no vitals taken for this visit.  Physical Exam   Constitutional: Patient appears well-developed HEENT:  Head: Normocephalic Eyes:EOM are normal Neck: Normal range of motion Cardiovascular: Normal rate Pulmonary/chest: Effort normal Neurologic: Patient is alert Skin: Skin is warm Psychiatric: Patient has normal mood and affect  Patient is in a wheelchair and is reluctant to weight-bear on the right leg  Ortho Exam examination of the right knee demonstrates intact extensor mechanism she does have slight amount of swelling in the right leg compared to the left leg.  Pedal pulses are intact.  She has a little bit of reactive skin within the skin folds of the anterior part of the knee.  This does not look like induration infection or erythema.  Collaterals  are stable.  Most of her pain is in the anterior portion of the knee.  Specialty Comments:  No specialty comments available.  Imaging: Xr Knee 3 View Right  Result Date: 10/31/2016 2 views right knee reviewed AP and lateral.  Medial compartment joint space narrowing is present.  Soft tissue envelope around the knee is significant.  No evidence of  acute or stress fracture noted.    PMFS History: Patient Active Problem List   Diagnosis Date Noted  . Pulmonary hypertension 03/22/2015  . Sleep apnea   . Atypical atrial flutter ([place]) 11/27/2013  . Chronic systolic CHF (congestive heart failure) ([place]) 03/29/2013  . History of laparoscopic adjustable gastric banding, APS, 05/09/2008 12/22/2012  . Fatigue 11/23/2012  . Thyroid disease  09/24/2012  . Degenerative disc disease, lumbar 09/24/2012  . Morbid obesity ([place]) 07/30/2012  . Chronic anticoagulation 04/01/2012  . OSA (obstructive sleep apnea) 04/01/2012  . Atrial fibrillation ([place]) 03/24/2012  . Decreased libido 03/01/2012  . Carpal tunnel syndrome   . Essential hypertension 06/20/2010  . Shortness of breath 12/13/2009   Past Medical History:  Diagnosis Date  . Anemia    "onc;e; had to take iron for awhile"  . Atrial fibrillation ([place])    Began in 2004.  Had ablations at [facility] in 2005 and 2007.  She is off coumadin  now with no noted recurrent atrial fibrillation since 2007. til 04/01/12; s/p initiation of Rhythmol with DCCV June 2013  . Carpal tunnel syndrome   . Degenerative disk disease   . GERD (gastroesophageal reflux disease)    resolved after lap band  . History of blood transfusion    "w/both hip replacements"  . HTN (hypertension)   . Hypothyroidism    no meds currently  . IBS (irritable bowel syndrome)   . Interstitial cystitis   . Morton's neuroma    right foot  . Obesity   . Shortness of breath   . Sleep apnea 04/01/12   "dx'd just last week"  . Sleep apnea    uses CPAP, 11 CWP with residual AHI 6.3    Family History  Problem Relation Age of Onset  . Hypertension Father   . Diabetes Father   . Dementia Father   . Atrial fibrillation Mother   . Diabetes Sister   . Diabetes Brother   . Heart disease Maternal Grandmother   . Heart attack      granfather    Past Surgical History:  Procedure Laterality Date  . ANTERIOR AND POSTERIOR REPAIR  10/20/2012   Procedure: ANTERIOR (CYSTOCELE) AND POSTERIOR REPAIR (RECTOCELE);  Surgeon: Alwyn Pea, MD;  Location: [facility];  Service: Gynecology;  Laterality: N/A;  2 hours  . ATRIAL FIBRILLATION ABLATION N/A 12/30/2012   Procedure: ATRIAL FIBRILLATION ABLATION;  Surgeon: Thompson Grayer, MD;  Location: [facility] CATH LAB;  Service: Cardiovascular;  Laterality: N/A;  . BACK SURGERY    . CARDIAC  CATHETERIZATION    . CARDIAC ELECTROPHYSIOLOGY MAPPING AND ABLATION  ~ 2005 and 2007   "did more 2nd time; both at [facility]"  . CARDIOVERSION  04/02/2012   Procedure: CARDIOVERSION;  Surgeon: Larey Dresser, MD;  Location: [facility];  Service: Cardiovascular;  Laterality: N/A;  . CARDIOVERSION  06/16/2012   Procedure: CARDIOVERSION;  Surgeon: Thayer Headings, MD;  Location: [place];  Service: Cardiovascular;  Laterality: N/A;  amanda/ebp/[place]( or scheduling)  . CARDIOVERSION  10/29/2012   Procedure: CARDIOVERSION;  Surgeon: Larey Dresser, MD;  Location: [facility]  ENDOSCOPY;  Service: Cardiovascular;  Laterality: N/A;  . CARDIOVERSION N/A 03/30/2013   Procedure: CARDIOVERSION;  Surgeon: Larey Dresser, MD;  Location: [facility];  Service: Cardiovascular;  Laterality: N/A;  . CARDIOVERSION N/A 12/23/2013   Procedure: CARDIOVERSION;  Surgeon: Dorothy Spark, MD;  Location: [facility];  Service: Cardiovascular;  Laterality: N/A;  9:27 Propofol 74m, IV   150 joules synched shock by Dr. NMeda Coffee@ 150 joules...SR   post 12 lead ordered.   . CARPAL TUNNEL RELEASE  1990's   bilaterally  . DILATION AND CURETTAGE OF UTERUS  2000  . JOINT REPLACEMENT     bilateral hips  . LAPAROSCOPIC GASTRIC BANDING  2009  . LASIK    . POSTERIOR FUSION LUMBAR SPINE  2000's   "nerve problems"  . POSTERIOR FUSION LUMBAR SPINE  2000's  . TEE WITHOUT CARDIOVERSION N/A 12/29/2012   Procedure: TRANSESOPHAGEAL ECHOCARDIOGRAM (TEE);  Surgeon: Peter M JMartinique MD;  [facility]  Service: Cardiovascular;  Laterality: N/A;  . [place] . TOTAL HIP ARTHROPLASTY  ~2009; 2010   right; left  . TUBAL LIGATION  1980  . VAGINAL HYSTERECTOMY  2000   Social History   Occupational History  . Not on file.   Social History Main Topics  . Smoking status: Never Smoker  . Smokeless tobacco: Never Used  . Alcohol use No  . Drug use: No  . Sexual activity: Yes     Comment: LAVH

## 2016-10-31 NOTE — Telephone Encounter (Signed)
Pt was worked in, she called several times and wanted to be seen today.

## 2016-11-01 ENCOUNTER — Encounter (INDEPENDENT_AMBULATORY_CARE_PROVIDER_SITE_OTHER): Payer: Self-pay | Admitting: Orthopaedic Surgery

## 2016-11-01 ENCOUNTER — Ambulatory Visit (INDEPENDENT_AMBULATORY_CARE_PROVIDER_SITE_OTHER): Payer: BC Managed Care – PPO | Admitting: Orthopaedic Surgery

## 2016-11-01 DIAGNOSIS — M25561 Pain in right knee: Secondary | ICD-10-CM

## 2016-11-01 LAB — SEDIMENTATION RATE: SED RATE: 88 mm/h — AB (ref 0–30)

## 2016-11-01 MED ORDER — METHYLPREDNISOLONE 4 MG PO TABS: ORAL_TABLET | ORAL | 0 refills | Status: DC

## 2016-11-01 MED ORDER — DOXYCYCLINE HYCLATE 100 MG PO TABS: 100.0000 mg | ORAL_TABLET | Freq: Two times a day (BID) | ORAL | 0 refills | Status: DC

## 2016-11-01 NOTE — Progress Notes (Signed)
The patient is coming in for continued evaluation of her acute right knee pain. She is seen Rexene EdisonGil Clark and had an injection in the knee. She even saw my partner Dr. August Saucerean yesterday. He ordered an ultrasound as well as a series of labs including CRP and sedimentation rate. He saw her leg yesterday and wanted me to see her today. She is also had x-rays of her knee which were negative for any type of acute injury. Of note she is well-known to me and she is someone who is morbidly obese.  On examination of her right knee and I do not feel significant warmth or effusion. There is redness only in the skin folds increases around the knee. I cannot palpate a Baker cyst. Much of her exam is limited by a large soft tissue envelope around her knee. I can easily flex and extend her knee without much pain. It does not appear infected to me. Her pain is mainly associated with weightbearing. There is also pain in her calf.  The labs are just drawn yesterday so probable see them until Monday. I do feel that she is an ultrasound of her knee and calf to rule out a DVT and assess for Baker cyst. I showed her put her on both doxycycline and a Medrol Dosepak. If something comes out of his studies we will let her know. I'll see her back the week after next to see if we need to order an MRI of that right knee. She cannot take anti-inflammatories could she is on a blood thinning medication Eliquis already.

## 2016-11-03 LAB — C-REACTIVE PROTEIN: CRP: 285.5 mg/L — AB (ref <8.0)

## 2016-11-04 ENCOUNTER — Other Ambulatory Visit (INDEPENDENT_AMBULATORY_CARE_PROVIDER_SITE_OTHER): Payer: Self-pay

## 2016-11-04 ENCOUNTER — Telehealth (INDEPENDENT_AMBULATORY_CARE_PROVIDER_SITE_OTHER): Payer: Self-pay | Admitting: Orthopaedic Surgery

## 2016-11-04 ENCOUNTER — Telehealth (INDEPENDENT_AMBULATORY_CARE_PROVIDER_SITE_OTHER): Payer: Self-pay

## 2016-11-04 ENCOUNTER — Telehealth (INDEPENDENT_AMBULATORY_CARE_PROVIDER_SITE_OTHER): Payer: Self-pay | Admitting: *Deleted

## 2016-11-04 DIAGNOSIS — M25561 Pain in right knee: Secondary | ICD-10-CM

## 2016-11-04 NOTE — Telephone Encounter (Signed)
Patient husband calling asking for her lab results.  Looks like her Sed Rate is 88 and her CRP is 285.5 That was drawn 10/31/16

## 2016-11-04 NOTE — Telephone Encounter (Signed)
Patient called asked for a call back concerning the information she was given about her blood work. Patient said she does not remember what she was told. Patient asked if she can have the MRI at [facility]. The number to contact her is [phone]

## 2016-11-04 NOTE — Telephone Encounter (Signed)
[facility] Jan 24th at 11:00am, pt is aware of appt

## 2016-11-04 NOTE — Telephone Encounter (Signed)
Patient aware.

## 2016-11-04 NOTE — Progress Notes (Signed)
Noted working on it now

## 2016-11-05 ENCOUNTER — Ambulatory Visit (INDEPENDENT_AMBULATORY_CARE_PROVIDER_SITE_OTHER): Payer: BC Managed Care – PPO | Admitting: Orthopaedic Surgery

## 2016-11-05 ENCOUNTER — Ambulatory Visit (HOSPITAL_COMMUNITY)
Admission: RE | Admit: 2016-11-05 | Discharge: 2016-11-05 | Disposition: A | Discharge status: Home / Self Care | Payer: BC Managed Care – PPO | Source: Ambulatory Visit | Attending: Orthopedic Surgery | Admitting: Orthopedic Surgery

## 2016-11-05 DIAGNOSIS — M25461 Effusion, right knee: Secondary | ICD-10-CM | POA: Insufficient documentation

## 2016-11-05 DIAGNOSIS — M25561 Pain in right knee: Secondary | ICD-10-CM | POA: Diagnosis present

## 2016-11-05 NOTE — Progress Notes (Signed)
VASCULAR LAB PRELIMINARY  PRELIMINARY  PRELIMINARY  PRELIMINARY  Right lower extremity venous duplex completed.    Preliminary report:  Right:  No evidence of DVT, superficial thrombosis, or Baker's cyst.  Shantale Holtmeyer, RVS 11/05/2016, 11:50 AM

## 2016-11-06 ENCOUNTER — Encounter (INDEPENDENT_AMBULATORY_CARE_PROVIDER_SITE_OTHER): Payer: Self-pay | Admitting: Orthopaedic Surgery

## 2016-11-06 DIAGNOSIS — S83241A Other tear of medial meniscus, current injury, right knee, initial encounter: Secondary | ICD-10-CM | POA: Insufficient documentation

## 2016-11-06 NOTE — Telephone Encounter (Signed)
Phone note dictated

## 2016-11-12 ENCOUNTER — Telehealth (INDEPENDENT_AMBULATORY_CARE_PROVIDER_SITE_OTHER): Payer: Self-pay | Admitting: Orthopaedic Surgery

## 2016-11-12 ENCOUNTER — Ambulatory Visit (INDEPENDENT_AMBULATORY_CARE_PROVIDER_SITE_OTHER): Payer: BC Managed Care – PPO | Admitting: Orthopaedic Surgery

## 2016-11-12 ENCOUNTER — Other Ambulatory Visit (INDEPENDENT_AMBULATORY_CARE_PROVIDER_SITE_OTHER): Payer: Self-pay | Admitting: Physician Assistant

## 2016-11-12 NOTE — Telephone Encounter (Signed)
Pt called asked for more tylenol number 3 she said she doesn't have enough to make it until surgery  [phone]

## 2016-11-12 NOTE — Telephone Encounter (Signed)
Called into pharmacy

## 2016-11-12 NOTE — Telephone Encounter (Signed)
Please advise 

## 2016-11-12 NOTE — Telephone Encounter (Signed)
Please call in tylenol #3, 1-2 every 8 hours as needed, #[phone]

## 2016-11-15 ENCOUNTER — Encounter (HOSPITAL_COMMUNITY): Payer: Self-pay | Admitting: *Deleted

## 2016-11-15 NOTE — Progress Notes (Signed)
Patient denies chest pain, shortness of breath as long as she is on oxygen. Reports her  Cardiologist is Dr. Clydie BraunFitzgerald, David [facility] and Dr. Kriste BasqueNadel is her pulmonologist. Reports last OV with both in August. Dr. Sampson GoonFitzgerald, D note is in care everywhere. Have made a note for Monday to have EKG and Echo requested.   Patient states she was due to see cardiology in January but had to cancel d/t knee issue. Patient states she has stopped Eliquis as of  11/14/2016. Spoke to Dr. Renold DonGermeroth regarding history and he advised we should be able to proceed as planned.

## 2016-11-17 ENCOUNTER — Encounter (HOSPITAL_COMMUNITY): Payer: Self-pay | Admitting: Vascular Surgery

## 2016-11-17 MED ORDER — SODIUM CHLORIDE 0.9 % IV SOLN
1500.0000 mg | INTRAVENOUS | Status: AC
  Administered 2016-11-18: 1500 mg via INTRAVENOUS
  Filled 2016-11-17: qty 1500

## 2016-11-17 NOTE — Progress Notes (Signed)
Anesthesia Chart Review: SAME DAY WORK-UP.  Patient is a 66 year old female scheduled for right knee arthroscopy with partial medial meniscectomy on 11/18/16 by Dr. Doneen Poissonhristopher Blackman. Anesthesia type is posted as choice.  History includes afib s/p ablation 12/30/12 and multiple cardioversions, never smoker, hypertension, IBS, interstitial cystitis, GERD, OSA (CPAP), secondary severe pulmonary hypertension (from OSA, super morbid obesity), hypothyroidism, home oxygen, lumbar fusion, laparoscopic gastric banding 05/09/08, hysterectomy, bilateral THA, T&A cystocele/rectocele repair '14. Last BMI recorded in Epic was 52.05.  Meds include Tylenol No. 3, amiodarone, amitriptyline, Zyrtec, Cardizem CD, Eliquis, Lasix, Mucinex, levothyroxine, magnesium, Lopressor. She reported last Eliquis dose 11/14/16.  - PCP is Dr. Lynnea FerrierWarren [name]. - Pulmonologist is Dr. Alroy DustScott Nadel, last visit 06/11/16 for follow-up and preoperative bariatric pulmonary is risk assessment. He ordered a ONA to evaluate for desaturations on CPAP (all > 90% on 3L with CPAP) and wanted to follow-up 2017 echo results from cardiology to evaluate pulmonary hypertension (RVSP 74, down from 80 2016). He felt she was "OK for the surgery which we hope will be life saving given these life threatening complications from her morbid obesity..." - Cardiologist is Dr. Clydie Braunavid Fitzgerald [facility]; see Care Everywhere), last visit 06/03/16. She was cleared for gastric sleeve surgery at that time. I don't see that she has had this bariatric surgery yet. (Reportedly, was denied by her insurance company.)  EKG 06/03/16([facility]iology): Sinus bradycardia at 58 bpm, first degree AV block.  Echo 10/16/15 ([facility]CRP-Cardiology): Conclusion: 1. Left ventricle cavity is normal in size. Normal global wall motion. Calculated EF 65%. 2. Mild to moderate biatrial enlargement. 3. Mild mitral regurgitation. 4. Right ventricle is hypokinetic. 5. Mild  tricuspid regurgitation with severe pulmonary hypertension, RVSP 74.1 mmHg. (Comparison echo 02/14/15: EF 63%, mild MR, mild TR, severe PHTN, RVSP 80 mmHg.)  Nuclear stress test 04/08/12: Overall Impression:  Low risk stress nuclear study. Fixed, small mild mid anterior perfusion defect.  Favor soft tissue attenuation rather than prior infarction given normal wall motion.  No ischemia.  LV Ejection Fraction: 60%.  LV Wall Motion:  NL LV Function; NL Wall Motion  She is for labs on arrival.   Above reviewed with anesthesiologist Dr. Renold DonGermeroth. Patient had pulmonology and cardiology evaluations ~ six months ago in preparation for bariatric surgery which never happened. If no acute changes then it is anticipated that she can proceed as planned.   Heather Ochsllison Ichael Pullara, Heather [name] [facility] Phone [phone] 11/17/2016 1:02 PM

## 2016-11-18 ENCOUNTER — Ambulatory Visit (HOSPITAL_COMMUNITY)
Admission: RE | Admit: 2016-11-18 | Discharge: 2016-11-18 | Disposition: A | Discharge status: Home / Self Care | Payer: BC Managed Care – PPO | Source: Ambulatory Visit | Attending: Orthopaedic Surgery | Admitting: Orthopaedic Surgery

## 2016-11-18 ENCOUNTER — Ambulatory Visit (HOSPITAL_COMMUNITY): Payer: BC Managed Care – PPO | Admitting: Vascular Surgery

## 2016-11-18 ENCOUNTER — Encounter (HOSPITAL_COMMUNITY)
Admission: RE | Disposition: A | Discharge status: Home / Self Care | Payer: Self-pay | Source: Ambulatory Visit | Attending: Orthopaedic Surgery

## 2016-11-18 ENCOUNTER — Encounter (HOSPITAL_COMMUNITY): Payer: Self-pay | Admitting: Certified Registered Nurse Anesthetist

## 2016-11-18 DIAGNOSIS — G4730 Sleep apnea, unspecified: Secondary | ICD-10-CM | POA: Insufficient documentation

## 2016-11-18 DIAGNOSIS — Z79899 Other long term (current) drug therapy: Secondary | ICD-10-CM | POA: Insufficient documentation

## 2016-11-18 DIAGNOSIS — I272 Pulmonary hypertension, unspecified: Secondary | ICD-10-CM | POA: Insufficient documentation

## 2016-11-18 DIAGNOSIS — Z7901 Long term (current) use of anticoagulants: Secondary | ICD-10-CM | POA: Diagnosis not present

## 2016-11-18 DIAGNOSIS — Z882 Allergy status to sulfonamides status: Secondary | ICD-10-CM | POA: Insufficient documentation

## 2016-11-18 DIAGNOSIS — Z96643 Presence of artificial hip joint, bilateral: Secondary | ICD-10-CM | POA: Diagnosis not present

## 2016-11-18 DIAGNOSIS — I4891 Unspecified atrial fibrillation: Secondary | ICD-10-CM | POA: Diagnosis not present

## 2016-11-18 DIAGNOSIS — Z9981 Dependence on supplemental oxygen: Secondary | ICD-10-CM | POA: Insufficient documentation

## 2016-11-18 DIAGNOSIS — X58XXXA Exposure to other specified factors, initial encounter: Secondary | ICD-10-CM | POA: Insufficient documentation

## 2016-11-18 DIAGNOSIS — I10 Essential (primary) hypertension: Secondary | ICD-10-CM | POA: Diagnosis not present

## 2016-11-18 DIAGNOSIS — Z9884 Bariatric surgery status: Secondary | ICD-10-CM | POA: Insufficient documentation

## 2016-11-18 DIAGNOSIS — E039 Hypothyroidism, unspecified: Secondary | ICD-10-CM | POA: Insufficient documentation

## 2016-11-18 DIAGNOSIS — Z6841 Body Mass Index (BMI) 40.0 and over, adult: Secondary | ICD-10-CM | POA: Insufficient documentation

## 2016-11-18 DIAGNOSIS — Z880 Allergy status to penicillin: Secondary | ICD-10-CM | POA: Diagnosis not present

## 2016-11-18 DIAGNOSIS — S83241A Other tear of medial meniscus, current injury, right knee, initial encounter: Secondary | ICD-10-CM | POA: Diagnosis present

## 2016-11-18 DIAGNOSIS — Z881 Allergy status to other antibiotic agents status: Secondary | ICD-10-CM | POA: Insufficient documentation

## 2016-11-18 HISTORY — DX: Dependence on supplemental oxygen: Z99.81

## 2016-11-18 HISTORY — DX: Pulmonary hypertension, unspecified: I27.20

## 2016-11-18 HISTORY — PX: KNEE ARTHROSCOPY: SHX127

## 2016-11-18 LAB — CBC
HCT: 35 % — ABNORMAL LOW (ref 36.0–46.0)
HEMOGLOBIN: 11.1 g/dL — AB (ref 12.0–15.0)
MCH: 28 pg (ref 26.0–34.0)
MCHC: 31.7 g/dL (ref 30.0–36.0)
MCV: 88.4 fL (ref 78.0–100.0)
Platelets: 274 10*3/uL (ref 150–400)
RBC: 3.96 MIL/uL (ref 3.87–5.11)
RDW: 15 % (ref 11.5–15.5)
WBC: 5.4 10*3/uL (ref 4.0–10.5)

## 2016-11-18 LAB — BASIC METABOLIC PANEL
Anion gap: 9 (ref 5–15)
BUN: 23 mg/dL — AB (ref 6–20)
CALCIUM: 9.1 mg/dL (ref 8.9–10.3)
CO2: 26 mmol/L (ref 22–32)
Chloride: 104 mmol/L (ref 101–111)
Creatinine, Ser: 0.87 mg/dL (ref 0.44–1.00)
GFR calc Af Amer: >60 mL/min (ref >60)
GLUCOSE: 105 mg/dL — AB (ref 65–99)
Potassium: 4.1 mmol/L (ref 3.5–5.1)
Sodium: 139 mmol/L (ref 135–145)

## 2016-11-18 LAB — PROTIME-INR
INR: 1.04
PROTHROMBIN TIME: 13.6 s (ref 11.4–15.2)

## 2016-11-18 SURGERY — ARTHROSCOPY, KNEE
Anesthesia: General | Site: Knee | Laterality: Right

## 2016-11-18 MED ORDER — MIDAZOLAM HCL 2 MG/2ML IJ SOLN
INTRAMUSCULAR | Status: AC
  Filled 2016-11-18: qty 2

## 2016-11-18 MED ORDER — ACETAMINOPHEN 10 MG/ML IV SOLN
INTRAVENOUS | Status: DC | PRN
  Administered 2016-11-18: 1000 mg via INTRAVENOUS

## 2016-11-18 MED ORDER — EPHEDRINE SULFATE-NACL 50-0.9 MG/10ML-% IV SOSY
PREFILLED_SYRINGE | INTRAVENOUS | Status: DC | PRN
  Administered 2016-11-18: 10 mg via INTRAVENOUS

## 2016-11-18 MED ORDER — ACETAMINOPHEN 10 MG/ML IV SOLN
INTRAVENOUS | Status: AC
  Filled 2016-11-18: qty 100

## 2016-11-18 MED ORDER — FENTANYL CITRATE (PF) 100 MCG/2ML IJ SOLN
INTRAMUSCULAR | Status: AC
  Filled 2016-11-18: qty 2

## 2016-11-18 MED ORDER — ONDANSETRON HCL 4 MG/2ML IJ SOLN
INTRAMUSCULAR | Status: DC | PRN
  Administered 2016-11-18: 4 mg via INTRAVENOUS

## 2016-11-18 MED ORDER — MEPERIDINE HCL 25 MG/ML IJ SOLN: 6.2500 mg | INTRAMUSCULAR | Status: DC | PRN

## 2016-11-18 MED ORDER — FENTANYL CITRATE (PF) 100 MCG/2ML IJ SOLN
25.0000 ug | INTRAMUSCULAR | Status: DC | PRN
  Administered 2016-11-18: 50 ug via INTRAVENOUS

## 2016-11-18 MED ORDER — BUPIVACAINE HCL (PF) 0.25 % IJ SOLN
INTRAMUSCULAR | Status: DC | PRN
  Administered 2016-11-18: 30 mL

## 2016-11-18 MED ORDER — CHLORHEXIDINE GLUCONATE 4 % EX LIQD: 60.0000 mL | Freq: Once | CUTANEOUS | Status: DC

## 2016-11-18 MED ORDER — FENTANYL CITRATE (PF) 100 MCG/2ML IJ SOLN
INTRAMUSCULAR | Status: DC | PRN
  Administered 2016-11-18 (×2): 25 ug via INTRAVENOUS

## 2016-11-18 MED ORDER — BUPIVACAINE HCL (PF) 0.25 % IJ SOLN
INTRAMUSCULAR | Status: AC
  Filled 2016-11-18: qty 30

## 2016-11-18 MED ORDER — PROPOFOL 10 MG/ML IV BOLUS
INTRAVENOUS | Status: DC | PRN
  Administered 2016-11-18: 170 mg via INTRAVENOUS

## 2016-11-18 MED ORDER — PROMETHAZINE HCL 25 MG/ML IJ SOLN: 6.2500 mg | INTRAMUSCULAR | Status: DC | PRN

## 2016-11-18 MED ORDER — LACTATED RINGERS IV SOLN: INTRAVENOUS | Status: DC

## 2016-11-18 MED ORDER — LACTATED RINGERS IV SOLN
INTRAVENOUS | Status: DC
  Administered 2016-11-18: 13:00:00 via INTRAVENOUS

## 2016-11-18 MED ORDER — ONDANSETRON HCL 4 MG/2ML IJ SOLN
INTRAMUSCULAR | Status: AC
  Filled 2016-11-18: qty 2

## 2016-11-18 MED ORDER — EPHEDRINE 5 MG/ML INJ
INTRAVENOUS | Status: AC
  Filled 2016-11-18: qty 10

## 2016-11-18 MED ORDER — LACTATED RINGERS IV SOLN
INTRAVENOUS | Status: DC | PRN
  Administered 2016-11-18: 13:00:00 via INTRAVENOUS

## 2016-11-18 MED ORDER — LIDOCAINE 2% (20 MG/ML) 5 ML SYRINGE
INTRAMUSCULAR | Status: DC | PRN
  Administered 2016-11-18: 60 mg via INTRAVENOUS

## 2016-11-18 MED ORDER — MORPHINE SULFATE (PF) 4 MG/ML IV SOLN
INTRAVENOUS | Status: AC
  Filled 2016-11-18: qty 1

## 2016-11-18 MED ORDER — MORPHINE SULFATE (PF) 4 MG/ML IV SOLN
INTRAVENOUS | Status: DC | PRN
  Administered 2016-11-18: 4 mg via INTRAVENOUS

## 2016-11-18 MED ORDER — MIDAZOLAM HCL 2 MG/2ML IJ SOLN
INTRAMUSCULAR | Status: DC | PRN
Start: 2016-11-18 — End: 2016-11-18
  Administered 2016-11-18: 2 mg via INTRAVENOUS

## 2016-11-18 MED ORDER — SODIUM CHLORIDE 0.9 % IR SOLN
Status: DC | PRN
  Administered 2016-11-18: 3000 mL

## 2016-11-18 MED ORDER — OXYCODONE-ACETAMINOPHEN 5-325 MG PO TABS: 1.0000 | ORAL_TABLET | ORAL | 0 refills | Status: DC | PRN

## 2016-11-18 SURGICAL SUPPLY — 33 items
BANDAGE ACE 6X5 VEL STRL LF (GAUZE/BANDAGES/DRESSINGS) ×3 IMPLANT
BLADE CUTTER GATOR 3.5 (BLADE) ×3 IMPLANT
BLADE SURG ROTATE 9660 (MISCELLANEOUS) ×4 IMPLANT
BNDG CMPR MED 15X6 ELC VLCR LF (GAUZE/BANDAGES/DRESSINGS) ×1
BNDG ELASTIC 6X15 VLCR STRL LF (GAUZE/BANDAGES/DRESSINGS) ×2 IMPLANT
DRAPE ARTHROSCOPY W/POUCH 114 (DRAPES) ×3 IMPLANT
DRAPE U-SHAPE 47X51 STRL (DRAPES) ×3 IMPLANT
DRSG PAD ABDOMINAL 8X10 ST (GAUZE/BANDAGES/DRESSINGS) ×4 IMPLANT
DURAPREP 26ML APPLICATOR (WOUND CARE) ×5 IMPLANT
FLUID NSS /IRRIG 3000 ML XXX (IV SOLUTION) ×4 IMPLANT
GAUZE SPONGE 4X4 12PLY STRL (GAUZE/BANDAGES/DRESSINGS) ×5 IMPLANT
GAUZE XEROFORM 1X8 LF (GAUZE/BANDAGES/DRESSINGS) ×1 IMPLANT
GLOVE BIOGEL PI IND STRL 8 (GLOVE) ×2 IMPLANT
GLOVE BIOGEL PI INDICATOR 8 (GLOVE) ×4
GLOVE ORTHO TXT STRL SZ7.5 (GLOVE) ×3 IMPLANT
GLOVE SURG ORTHO 8.0 STRL STRW (GLOVE) ×3 IMPLANT
GOWN STRL REUS W/ TWL LRG LVL3 (GOWN DISPOSABLE) ×2 IMPLANT
GOWN STRL REUS W/ TWL XL LVL3 (GOWN DISPOSABLE) ×4 IMPLANT
GOWN STRL REUS W/TWL LRG LVL3 (GOWN DISPOSABLE) ×9
GOWN STRL REUS W/TWL XL LVL3 (GOWN DISPOSABLE)
KIT ROOM TURNOVER OR (KITS) ×3 IMPLANT
MANIFOLD NEPTUNE II (INSTRUMENTS) ×2 IMPLANT
PACK ARTHROSCOPY DSU (CUSTOM PROCEDURE TRAY) ×3 IMPLANT
PAD ARMBOARD 7.5X6 YLW CONV (MISCELLANEOUS) ×6 IMPLANT
PADDING CAST COTTON 6X4 STRL (CAST SUPPLIES) ×1 IMPLANT
SET ARTHROSCOPY TUBING (MISCELLANEOUS) ×3
SET ARTHROSCOPY TUBING LN (MISCELLANEOUS) ×1 IMPLANT
SPONGE LAP 4X18 X RAY DECT (DISPOSABLE) ×1 IMPLANT
SUT ETHILON 3 0 PS 1 (SUTURE) ×3 IMPLANT
TOWEL OR 17X24 6PK STRL BLUE (TOWEL DISPOSABLE) ×3 IMPLANT
TOWEL OR 17X26 10 PK STRL BLUE (TOWEL DISPOSABLE) ×3 IMPLANT
WAND HAND CNTRL MULTIVAC 90 (MISCELLANEOUS) IMPLANT
WATER STERILE IRR 1000ML POUR (IV SOLUTION) ×1 IMPLANT

## 2016-11-18 NOTE — Discharge Instructions (Signed)
Increase your activities as comfort allows. You may put all of your weight as tolerated on your right leg. Ice as needed for swelling. Pump your feet throughout he day the next few days. You can remove all of your dressings in 24 hours and get your incisions wet daily in the shower. Place band-aids over your incisions daily.

## 2016-11-18 NOTE — Op Note (Deleted)
  The note originally documented on this encounter has been moved the the encounter in which it belongs.  

## 2016-11-18 NOTE — Transfer of Care (Signed)
Immediate Anesthesia Transfer of Care Note  Patient: Heather NipperCynthia M [name]  Procedure(s) Performed: Procedure(s): RIGHT KNEE ARTHROSCOPY WITH DEBRIDEMENT (Right)  Patient Location: PACU  Anesthesia Type:General  Level of Consciousness: awake, alert  and oriented  Airway & Oxygen Therapy: Patient Spontanous Breathing and Patient connected to nasal cannula oxygen  Post-op Assessment: Report given to RN, Post -op Vital signs reviewed and stable and Patient moving all extremities X 4  Post vital signs: Reviewed and stable  Last Vitals:  Vitals:   11/18/16 1223  BP: 133/70  Pulse: (!) 59  Resp: 18  Temp: 36.8 C    Last Pain:  Vitals:   11/18/16 1245  TempSrc:   PainSc: 6       Patients Stated Pain Goal: 2 (11/18/16 1245)  Complications: No apparent anesthesia complications

## 2016-11-18 NOTE — Anesthesia Procedure Notes (Signed)
Procedure Name: LMA Insertion Date/Time: 11/18/2016 2:01 PM Performed by: Rise PatienceBELL, Senica Crall T Pre-anesthesia Checklist: Patient identified, Emergency Drugs available, Suction available and Patient being monitored Patient Re-evaluated:Patient Re-evaluated prior to inductionOxygen Delivery Method: Circle System Utilized Preoxygenation: Pre-oxygenation with 100% oxygen Intubation Type: IV induction LMA: LMA inserted LMA Size: 4.0 Number of attempts: 1 Airway Equipment and Method: Bite block Placement Confirmation: positive ETCO2 and breath sounds checked- equal and bilateral Tube secured with: Tape Dental Injury: Teeth and Oropharynx as per pre-operative assessment

## 2016-11-18 NOTE — Anesthesia Preprocedure Evaluation (Addendum)
Anesthesia Evaluation  Patient identified by MRN, date of birth, ID band Patient awake    Reviewed: Allergy & Precautions, NPO status , Patient's Chart, lab work & pertinent test results  Airway Mallampati: II  TM Distance: >3 FB Neck ROM: Full    Dental  (+) Teeth Intact, Dental Advisory Given   Pulmonary shortness of breath, with exertion and Long-Term Oxygen Therapy, sleep apnea, Continuous Positive Airway Pressure Ventilation and Oxygen sleep apnea ,    breath sounds clear to auscultation       Cardiovascular hypertension, Pt. on medications and Pt. on home beta blockers + dysrhythmias Atrial Fibrillation  Rhythm:Regular Rate:Normal  EF 65%, PA 70's   Neuro/Psych PSYCHIATRIC DISORDERS  Neuromuscular disease    GI/Hepatic Neg liver ROS, GERD  ,  Endo/Other  Hypothyroidism Morbid obesity  Renal/GU negative Renal ROS  negative genitourinary   Musculoskeletal  (+) Arthritis , Osteoarthritis,    Abdominal   Peds  Hematology negative hematology ROS (+)   Anesthesia Other Findings   Reproductive/Obstetrics negative OB ROS                           Anesthesia Physical Anesthesia Plan  ASA: III  Anesthesia Plan: General   Post-op Pain Management:    Induction: Intravenous  Airway Management Planned: LMA  Additional Equipment:   Intra-op Plan:   Post-operative Plan: Extubation in OR  Informed Consent: I have reviewed the patients History and Physical, chart, labs and discussed the procedure including the risks, benefits and alternatives for the proposed anesthesia with the patient or authorized representative who has indicated his/her understanding and acceptance.   Dental advisory given  Plan Discussed with: CRNA  Anesthesia Plan Comments:         Anesthesia Quick Evaluation

## 2016-11-18 NOTE — Op Note (Signed)
Heather [name], Heather [name]              ACCOUNT NO.:  [id number]  MEDICAL RECORD NO.:  [id number]  LOCATION:                               FACILITY:  [facility]  PHYSICIAN:  Vanita Panda. Magnus Ivan, M.D.DATE OF BIRTH:  Oct 22, 1950  DATE OF PROCEDURE:  11/18/2016 DATE OF DISCHARGE:                              OPERATIVE REPORT   PREOPERATIVE DIAGNOSIS:  Right knee acute medial meniscal tear.  POSTOPERATIVE DIAGNOSIS:  Right knee acute medial meniscal tear with acute torn anterior cruciate ligament as well as tricompartmental arthritic changes.  PROCEDURE:  Right knee arthroscopy with extensive debridement.  SURGEON:  Vanita Panda. Magnus Ivan, MD.  ASSISTANT:  Richardean Canal, PA-C.  ANESTHESIA: 1. General. 2. Local with mixture of morphine and Marcaine.  BLOOD LOSS:  Minimal.  COMPLICATIONS:  None.  INDICATIONS:  Heather [name] is a very sweet 66 year old female patient of mine who is morbidly obese.  She has had bilateral hip replacements in the past, and her right knee has become acutely painful with difficulty with weightbearing.  There was felt to be an infectious process originally given the pain that she was having and some redness around her knee; however, this did not seem to be the case.  She tolerated a steroid injection in her knee, but it did not help with her pain.  We did obtain an MRI of the knee and showed acute changes within the knee with an ACL deficient knee with also worn cartilage throughout the knee. She is someone who had her body habitus too large to have a knee replacement right now.  We felt that arthroscopic intervention may help temporize her symptoms.  She did agree with this as well.  PROCEDURE DESCRIPTION:  Informed consent was obtained.  Appropriate right knee was marked.  She was brought to the operating room, placed supine on the operative table.  General anesthesia was then obtained. Her right leg was prepped and draped from the thigh down the ankle  with DuraPrep and sterile drapes.  Bed was raised and a lateral leg post was utilized and the right knee was flexed off the side of the table.  A time-out was called.  She was identified as correct patient and correct right knee.  I then made an anterolateral arthroscopy portal, inserted a cannula in the knee, and did find just some mild effusion in the knee. I went to the medial compartment and made an anteromedial incision.  We prepped the cartilage on the medial femoral condyle and medial tibial plateau and found areas of full-thickness cartilage loss and degenerative meniscal tearing as well.  We debrided all these areas through an anteromedial incision with an arthroscopic shaver.  We were able to debride meniscus and cartilage.  We went to the intercondylar area of the knee and found significant synovitis in the knee.  We did find the ACL was completely gone and deficient.  With the knee in a figure 4 position, there was likewise severe cartilage changes in the knee with degenerative posterior horn meniscal tearing on the lateral side as well.  We then went to suprapatellar space and debrided some synovitis in this area.  We then allowed fluid to lavage through the  knee and then drained all fluid from the knee.  We closed the portal sites with nylon suture and inserted a mixture of morphine and Marcaine in the knee.  She was awakened, extubated, and taken to recovery room in stable condition.  All final counts were correct.  There were no complications noted.  Of note, Richardean CanalGilbert Clark, PA-C's assistance was crucial for this case due to the patient's morbid obesity.     Vanita Pandahristopher Y. Magnus IvanBlackman, M.D.   ______________________________ Vanita Pandahristopher Y. Magnus IvanBlackman, M.D.    CYB/MEDQ  D:  11/18/2016  T:  11/18/2016  Job:  [id number]

## 2016-11-18 NOTE — Brief Op Note (Signed)
11/18/2016  2:48 PM  PATIENT:  Heather [name]  66 y.o. 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   Delay start of Pharmacological VTE agent (>24hrs) due to surgical blood loss or risk of bleeding: no

## 2016-11-18 NOTE — H&P (Signed)
Heather [name] is an 66 y.o. female.   Chief Complaint:   Right knee pain with locking and catching HPI:   66 yo morbidly obese female well-known to me.  Has had an acute onset of knee pain with locking and catching that has worsened.  She has tried and failed all forms of conservative treatment and now presents for an arthroscopic intervention.  Past Medical History:  Diagnosis Date  . Anemia    "onc;e; had to take iron for awhile"  . Atrial fibrillation ([place])    Began in 2004.  Had ablations at [facility] in 2005 and 2007.  She is off coumadin  now with no noted recurrent atrial fibrillation since 2007. til 04/01/12; s/p initiation of Rhythmol with DCCV June 2013  . Carpal tunnel syndrome   . Degenerative disk disease   . GERD (gastroesophageal reflux disease)    resolved after lap band  . History of blood transfusion    "w/both hip replacements"  . HTN (hypertension)   . Hypothyroidism   . IBS (irritable bowel syndrome)   . Interstitial cystitis   . Morton's neuroma    right foot  . Obesity   . On home oxygen therapy   . Pulmonary hypertension   . Shortness of breath    only without oxygen  . Sleep apnea 04/01/12   "dx'd just last week"  . Sleep apnea    uses CPAP, 11 CWP with residual AHI 6.3    Past Surgical History:  Procedure Laterality Date  . ANTERIOR AND POSTERIOR REPAIR  10/20/2012   Procedure: ANTERIOR (CYSTOCELE) AND POSTERIOR REPAIR (RECTOCELE);  Surgeon: Alwyn Pea, MD;  Location: [facility];  Service: Gynecology;  Laterality: N/A;  2 hours  . ATRIAL FIBRILLATION ABLATION N/A 12/30/2012   Procedure: ATRIAL FIBRILLATION ABLATION;  Surgeon: Thompson Grayer, MD;  Location: [facility] CATH LAB;  Service: Cardiovascular;  Laterality: N/A;  . BACK SURGERY    . CARDIAC CATHETERIZATION    . CARDIAC ELECTROPHYSIOLOGY MAPPING AND ABLATION  ~ 2005 and 2007   "did more 2nd time; both at [facility]"  . CARDIOVERSION  04/02/2012   Procedure: CARDIOVERSION;  Surgeon: Larey Dresser, MD;  Location: [place];  Service: Cardiovascular;  Laterality: N/A;  . CARDIOVERSION  06/16/2012   Procedure: CARDIOVERSION;  Surgeon: Thayer Headings, MD;  Location: [place];  Service: Cardiovascular;  Laterality: N/A;  amanda/ebp/Beverly( or scheduling)  . CARDIOVERSION  10/29/2012   Procedure: CARDIOVERSION;  Surgeon: Larey Dresser, MD;  Location: [facility] ENDOSCOPY;  Service: Cardiovascular;  Laterality: N/A;  . CARDIOVERSION N/A 03/30/2013   Procedure: CARDIOVERSION;  Surgeon: Larey Dresser, MD;  Location: [place];  Service: Cardiovascular;  Laterality: N/A;  . CARDIOVERSION N/A 12/23/2013   Procedure: CARDIOVERSION;  Surgeon: Dorothy Spark, MD;  Location: [place];  Service: Cardiovascular;  Laterality: N/A;  9:27 Propofol 46m, IV   150 joules synched shock by Dr. NMeda Coffee@ 150 joules...SR   post 12 lead ordered.   . CARPAL TUNNEL RELEASE  1990's   bilaterally  . DILATION AND CURETTAGE OF UTERUS  2000  . JOINT REPLACEMENT     bilateral hips  . LAPAROSCOPIC GASTRIC BANDING  2009  . LASIK    . POSTERIOR FUSION LUMBAR SPINE  2000's   "nerve problems"  . POSTERIOR FUSION LUMBAR SPINE  2000's  . TEE WITHOUT CARDIOVERSION N/A 12/29/2012   Procedure: TRANSESOPHAGEAL ECHOCARDIOGRAM (TEE);  Surgeon: Peter M JMartinique MD;  [facility];  Service: Cardiovascular;  Laterality: N/A;  . TONSILLECTOMY AND ADENOIDECTOMY  1957  . TOTAL HIP ARTHROPLASTY  ~2009; 2010   right; left  . TUBAL LIGATION  1980  . VAGINAL HYSTERECTOMY  2000    Family History  Problem Relation Age of Onset  . Hypertension Father   . Diabetes Father   . Dementia Father   . Atrial fibrillation Mother   . Diabetes Sister   . Diabetes Brother   . Heart disease Maternal Grandmother   . Heart attack      granfather   Social History:  reports that she has never smoked. She has never used smokeless tobacco. She reports that she does not drink alcohol or use drugs.  Allergies:  Allergies   Allergen Reactions  . Cephalexin Hives and Other (See Comments)    "throat started closing up"  . Rocephin [Ceftriaxone Sodium In Dextrose] Anaphylaxis  . Penicillins Rash    Has patient had a PCN reaction causing immediate rash, facial/tongue/throat swelling, SOB or lightheadedness with hypotension: no Has patient had a PCN reaction causing severe rash involving mucus membranes or skin necrosis: no Has patient had a PCN reaction that required hospitalization no Has patient had a PCN reaction occurring within the last 10 years: no If all of the above answers are "NO", then may proceed with Cephalosporin use.  . Flecainide Acetate Other (See Comments)    "couldn't take it"  . Moxifloxacin     Does not remember the reaction  . Sulfonamide Derivatives     Does not remember reaction ; "was so young when I had reaction to it"    Medications Prior to Admission  Medication Sig Dispense Refill  . acetaminophen-codeine (TYLENOL #3) 300-30 MG tablet Take 1 tablet by mouth every 6 (six) hours as needed. for pain  0  . amiodarone (PACERONE) 200 MG tablet Take 1 tablet (200 mg total) by mouth daily. 90 tablet 0  . amitriptyline (ELAVIL) 10 MG tablet TAKE 1 TABLET BY MOUTH AT  BEDTIME 90 tablet 3  . cetirizine (ZYRTEC) 10 MG tablet Take 10 mg by mouth daily.    . Cholecalciferol (VITAMIN D) 2000 UNITS tablet Take 2,000 Units by mouth daily.    Marland Kitchen diltiazem (CARDIZEM CD) 120 MG 24 hr capsule Take 110m by mouth daily    . ELIQUIS 5 MG TABS tablet TAKE 1 TABLET BY MOUTH TWICE A DAY....NEED OFFICE VISIT BEFORE ANY MORE REFILLS 60 tablet 6  . furosemide (LASIX) 20 MG tablet Take 40 mg by mouth daily.    .Marland KitchenguaiFENesin (MUCINEX) 600 MG 12 hr tablet Take 600 mg by mouth 2 (two) times daily as needed. For allergies.    .Marland Kitchenlevothyroxine (SYNTHROID, LEVOTHROID) 50 MCG tablet Take 1 tablet by mouth  daily before breakfast 90 tablet 1  . losartan (COZAAR) 100 MG tablet Take 1 tablet by mouth  daily 90 tablet 1   . Magnesium 250 MG TABS Take 250 mg by mouth 2 (two) times daily.    . metoprolol tartrate (LOPRESSOR) 25 MG tablet Take 25 mg by mouth 2 (two) times daily.   11    Results for orders placed or performed during the hospital encounter of 11/18/16 (from the past 48 hour(s))  Basic metabolic panel     Status: Abnormal   Collection Time: 11/18/16 12:33 PM  Result Value Ref Range   Sodium 139 135 - 145 mmol/L   Potassium 4.1 3.5 - 5.1 mmol/L   Chloride 104 101 -  111 mmol/L   CO2 26 22 - 32 mmol/L   Glucose, Bld 105 (H) 65 - 99 mg/dL   BUN 23 (H) 6 - 20 mg/dL   Creatinine, Ser 0.87 0.44 - 1.00 mg/dL   Calcium 9.1 8.9 - 10.3 mg/dL   GFR calc non Af Amer >60 >60 mL/min   GFR calc Af Amer >60 >60 mL/min    Comment: (NOTE) The eGFR has been calculated using the CKD EPI equation. This calculation has not been validated in all clinical situations. eGFR's persistently <60 mL/min signify possible Chronic Kidney Disease.    Anion gap 9 5 - 15  CBC     Status: Abnormal   Collection Time: 11/18/16 12:33 PM  Result Value Ref Range   WBC 5.4 4.0 - 10.5 K/uL   RBC 3.96 3.87 - 5.11 MIL/uL   Hemoglobin 11.1 (L) 12.0 - 15.0 g/dL   HCT 35.0 (L) 36.0 - 46.0 %   MCV 88.4 78.0 - 100.0 fL   MCH 28.0 26.0 - 34.0 pg   MCHC 31.7 30.0 - 36.0 g/dL   RDW 15.0 11.5 - 15.5 %   Platelets 274 150 - 400 K/uL  Protime-INR     Status: None   Collection Time: 11/18/16 12:33 PM  Result Value Ref Range   Prothrombin Time 13.6 11.4 - 15.2 seconds   INR 1.04    No results found.  Review of Systems  Respiratory: Positive for shortness of breath.   Musculoskeletal: Positive for joint pain.  All other systems reviewed and are negative.   Blood pressure 133/70, pulse (!) 59, temperature 98.3 F (36.8 C), temperature source Oral, resp. rate 18, height 5' 5.5" (1.664 m), weight 300 lb (136.1 kg), SpO2 99 %. Physical Exam  Constitutional: She appears well-developed and well-nourished.  HENT:  Head:  Normocephalic and atraumatic.  Eyes: EOM are normal. Pupils are equal, round, and reactive to light.  Neck: Normal range of motion. Neck supple.  Cardiovascular: Normal rate and regular rhythm.   Respiratory: Effort normal and breath sounds normal.  GI: Soft. Bowel sounds are normal.  Musculoskeletal:       Right knee: She exhibits decreased range of motion, swelling and effusion. Tenderness found. Medial joint line tenderness noted.  Neurological: She is alert.  Skin: Skin is warm and dry.  Psychiatric: She has a normal mood and affect.     Assessment/Plan Right knee with acute pain and medial meniscal tear 1)  To the OR today as an outpatient for a right knee arthroscopy with debridement and a partial meniscectomy.  Risks and benefits discussed in detail.  Mcarthur Rossetti, MD 11/18/2016, 1:40 PM

## 2016-11-19 ENCOUNTER — Encounter (HOSPITAL_COMMUNITY): Payer: Self-pay | Admitting: Orthopaedic Surgery

## 2016-11-19 ENCOUNTER — Other Ambulatory Visit: Payer: Self-pay | Admitting: Family Medicine

## 2016-11-20 NOTE — Anesthesia Postprocedure Evaluation (Addendum)
Anesthesia Post Note  Patient: Heather NipperCynthia M Shadoan  Procedure(s) Performed: Procedure(s) (LRB): RIGHT KNEE ARTHROSCOPY WITH DEBRIDEMENT (Right)  Patient location during evaluation: PACU Anesthesia Type: General Level of consciousness: awake and alert Pain management: pain level controlled Vital Signs Assessment: post-procedure vital signs reviewed and stable Respiratory status: spontaneous breathing, nonlabored ventilation, respiratory function stable and patient connected to nasal cannula oxygen Cardiovascular status: blood pressure returned to baseline and stable Postop Assessment: no signs of nausea or vomiting Anesthetic complications: no       Last Vitals:  Vitals:   11/18/16 1625 11/18/16 1640  BP: 114/62 117/61  Pulse: (!) 51 (!) 50  Resp: 18 16  Temp:  36.4 C    Last Pain:  Vitals:   11/18/16 1525  TempSrc:   PainSc: Asleep                 Shelton SilvasKevin D Merary Garguilo

## 2016-11-25 ENCOUNTER — Ambulatory Visit (INDEPENDENT_AMBULATORY_CARE_PROVIDER_SITE_OTHER): Payer: BC Managed Care – PPO | Admitting: Orthopaedic Surgery

## 2016-11-25 DIAGNOSIS — Z9889 Other specified postprocedural states: Secondary | ICD-10-CM | POA: Insufficient documentation

## 2016-11-25 MED ORDER — DICLOFENAC SODIUM 1 % TD GEL: 2.0000 g | Freq: Four times a day (QID) | TRANSDERMAL | 3 refills | Status: DC

## 2016-11-25 NOTE — Progress Notes (Signed)
The patient is now one-week status post a right knee arthroscopy. She is someone who is morbidly obese and we did this to try to calm down her knee pain no issues at significant arthritis in that knee and is not candidate for knee replacement surgery. She family with a walker and is on home oxygen. She says the knee does feel better since surgery. She like a refill Tylenol 3. I talked her about considering hyaluronic acid injections and we'll see if we can order Synvisc 1 for her right knee. I'm also going to send in a prescription for Voltaren gel to try on her knee.  On examination of the knee itself there is no infection. I removed 2 sutures and place Band-Aids. We talked about what we found at the time of her surgery as well.  We'll see her back in a month and her foot she'll be using Voltaren gel that standpoint and [address] Synvisc 1 in her knee at that visit.

## 2016-12-08 ENCOUNTER — Telehealth: Payer: Self-pay | Admitting: Family Medicine

## 2016-12-08 NOTE — Telephone Encounter (Signed)
Pt says she has shingles.  Rash on left buttock coming around to front.  Having pain under ribs.  Says using essential oils with some relief.  But asking what can she do for shingles and pain.  Please advise.

## 2016-12-08 NOTE — Telephone Encounter (Signed)
Valtrex 1 g by mouth 3 times a day for 7 days. Her code 5/325 one by mouth every 6 hours when necessary pain #30

## 2016-12-09 MED ORDER — VALACYCLOVIR HCL 1 G PO TABS: 1000.0000 mg | ORAL_TABLET | Freq: Three times a day (TID) | ORAL | 0 refills | Status: DC

## 2016-12-09 NOTE — Telephone Encounter (Signed)
RX for Valtrex filled pt stated she did not need the oxycodone b/c she had knee surgery and had been taking tylenol# 3 that has been working for her.

## 2016-12-23 ENCOUNTER — Ambulatory Visit (INDEPENDENT_AMBULATORY_CARE_PROVIDER_SITE_OTHER): Payer: BC Managed Care – PPO | Admitting: Orthopaedic Surgery

## 2016-12-23 DIAGNOSIS — G8929 Other chronic pain: Secondary | ICD-10-CM

## 2016-12-23 DIAGNOSIS — M25561 Pain in right knee: Secondary | ICD-10-CM

## 2016-12-23 NOTE — Progress Notes (Signed)
The patient is here for a scheduled Synvisc 1 injection into her right knee. She has significant arthritis in her knee. We cannot performed knee replacement surgery do for her multiple medical problems including her morbid obesity. We have performed an arthroscopic intervention and a steroid injection in her knee. We felt that hyaluronic acid. The next step. She is doing okay overall.  On examination there is no redness of her knee on the right side there is no effusion.  I prepped her right knee with Betadine and alcohol and after 5 mL lidocaine inserted Synvisc 1 into the superior lateral aspect. She tolerated this well. See her back in 2 months to see how she doing overall. We can always provide a steroid injection and if needed.

## 2017-01-19 ENCOUNTER — Ambulatory Visit (INDEPENDENT_AMBULATORY_CARE_PROVIDER_SITE_OTHER): Payer: BC Managed Care – PPO | Admitting: Adult Health

## 2017-01-19 ENCOUNTER — Encounter: Payer: Self-pay | Admitting: Adult Health

## 2017-01-19 ENCOUNTER — Ambulatory Visit (INDEPENDENT_AMBULATORY_CARE_PROVIDER_SITE_OTHER)
Admission: RE | Admit: 2017-01-19 | Discharge: 2017-01-19 | Disposition: A | Discharge status: Home / Self Care | Payer: BC Managed Care – PPO | Source: Ambulatory Visit | Attending: Adult Health | Admitting: Adult Health

## 2017-01-19 ENCOUNTER — Other Ambulatory Visit (INDEPENDENT_AMBULATORY_CARE_PROVIDER_SITE_OTHER): Payer: BC Managed Care – PPO

## 2017-01-19 ENCOUNTER — Telehealth: Payer: Self-pay | Admitting: Pulmonary Disease

## 2017-01-19 VITALS — BP 110/68 | HR 86 | Ht 65.0 in | Wt 309.8 lb

## 2017-01-19 DIAGNOSIS — R0602 Shortness of breath: Secondary | ICD-10-CM | POA: Diagnosis not present

## 2017-01-19 DIAGNOSIS — G4733 Obstructive sleep apnea (adult) (pediatric): Secondary | ICD-10-CM

## 2017-01-19 DIAGNOSIS — I272 Pulmonary hypertension, unspecified: Secondary | ICD-10-CM | POA: Diagnosis not present

## 2017-01-19 LAB — CBC WITH DIFFERENTIAL/PLATELET
BASOS ABS: 0.1 10*3/uL (ref 0.0–0.1)
BASOS PCT: 0.7 % (ref 0.0–3.0)
EOS PCT: 2.7 % (ref 0.0–5.0)
Eosinophils Absolute: 0.3 10*3/uL (ref 0.0–0.7)
HEMATOCRIT: 38 % (ref 36.0–46.0)
Hemoglobin: 12.5 g/dL (ref 12.0–15.0)
Lymphocytes Relative: 13.4 % (ref 12.0–46.0)
Lymphs Abs: 1.2 10*3/uL (ref 0.7–4.0)
MCHC: 32.9 g/dL (ref 30.0–36.0)
MCV: 86.8 fl (ref 78.0–100.0)
Monocytes Absolute: 0.8 10*3/uL (ref 0.1–1.0)
Monocytes Relative: 8.7 % (ref 3.0–12.0)
Neutro Abs: 6.9 10*3/uL (ref 1.4–7.7)
Neutrophils Relative %: 74.5 % (ref 43.0–77.0)
PLATELETS: 333 10*3/uL (ref 150.0–400.0)
RBC: 4.38 Mil/uL (ref 3.87–5.11)
RDW: 15.5 % (ref 11.5–15.5)
WBC: 9.3 10*3/uL (ref 4.0–10.5)

## 2017-01-19 LAB — BASIC METABOLIC PANEL
BUN: 22 mg/dL (ref 6–23)
CALCIUM: 9.6 mg/dL (ref 8.4–10.5)
CHLORIDE: 102 meq/L (ref 96–112)
CO2: 30 meq/L (ref 19–32)
CREATININE: 1.03 mg/dL (ref 0.40–1.20)
GFR: 57.24 mL/min — AB (ref >60.00)
Glucose, Bld: 101 mg/dL — ABNORMAL HIGH (ref 70–99)
Potassium: 4 mEq/L (ref 3.5–5.1)
Sodium: 142 mEq/L (ref 135–145)

## 2017-01-19 LAB — BRAIN NATRIURETIC PEPTIDE: Pro B Natriuretic peptide (BNP): 250 pg/mL — ABNORMAL HIGH (ref 0.0–100.0)

## 2017-01-19 NOTE — Assessment & Plan Note (Signed)
Need CPAP download to look at control and compliance

## 2017-01-19 NOTE — Assessment & Plan Note (Signed)
Worsening DOE ? Etiology  Check BNP , echo , cbc  Check cxr - on Amio (doubt -no cough )   Plan Patient Instructions  Increase Lasix  daily for 3 days .  Low salt diet .  Set up for Echo  Labs today .  Continue on oxygen 2l/m rest and 3l/m activity .  Continue on CPAP At bedtime  With 3l/m .  CPAP download .  Follow up with Dr. Kriste Basque  In 6 weeks and As needed .  Please contact office for sooner follow up if symptoms do not improve or worsen or seek emergency care

## 2017-01-19 NOTE — Assessment & Plan Note (Signed)
?  worsening pulmonary HTN  Check BNP  Check echo .  Cont on O2  Increase lasix x 3 days   Plan  Patient Instructions  Increase Lasix  daily for 3 days .  Low salt diet .  Set up for Echo  Labs today .  Continue on oxygen 2l/m rest and 3l/m activity .  Continue on CPAP At bedtime  With 3l/m .  CPAP download .  Follow up with Dr. Kriste Basque  In 6 weeks and As needed .  Please contact office for sooner follow up if symptoms do not improve or worsen or seek emergency care

## 2017-01-19 NOTE — Patient Instructions (Signed)
Increase Lasix  daily for 3 days .  Low salt diet .  Set up for Echo  Labs today .  Continue on oxygen 2l/m rest and 3l/m activity .  Continue on CPAP At bedtime  With 3l/m .  CPAP download .  Follow up with Dr. Kriste Basque  In 6 weeks and As needed .  Please contact office for sooner follow up if symptoms do not improve or worsen or seek emergency care

## 2017-01-19 NOTE — Progress Notes (Signed)
  ID: Heather [name], female    DOB: 09-19-1951, 66 y.o.   MRN: [id number]  Chief Complaint  Patient presents with  . 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  Has OSA on CPAP w/ O2 3l/m   TEST   Sleep Study in Epic was 03/25/12 at the [facility] on [address], [place]- it showed severe OSA w/ AHI=25/hr (52/hr in REM), and RDI=29/hr (54/hr in REM), assoc w/ oxygen desat to 76%, mod snoring, no limb movements- events noted to be worse supine & during REM.Marland KitchenMarland Kitchen 2DEcho done 02/14/15 in [facility] showing norm LV size & function w/ EF=60-65% & norm wall motion, mild MR, mod LA&RA dil, norm RVF w/ severe pulmHTN (RVSP=80); prev 2DEcho 04/13/13 showed norm LVF w/ EF=55-60%, norm wall motion, no signif valve dis, mild-mod LAdil at 43mm and PAsys=64mmHg;  01/19/2017 Acute OV  Pt presents for an acute office visit.  Complains over last 3 weeks has had progressive DOE. Now gets sob with minimal activity despite being on oxygen. Gets sob getting dressed.   She is on O2 at 2l/m rest . Has noticed O2 sats drop with walking to upper 80s at times. Especially on pulsed dosing O2  Machine.  Does have some leg swelling , wt is up few lbs last couple of weeks. She did take extra  of lasix today . No calf pain , or hemopytsis .   Knee surgery 11/2016 , did well. Off Eliquis for 3 days prior to surgery .   Hx of A Fib /Flutter on Eliquis , no missed doses. On Amiodarone . No cough or wheezing  No palpitations or fluttering .   Is on low carb diet , lost 60lbs .   Allergies  Allergen Reactions  . Cephalexin Hives and Other (See Comments)    "throat started closing up"  . Rocephin [Ceftriaxone Sodium In Dextrose] Anaphylaxis  . Penicillins Rash    Has patient had a PCN reaction causing immediate rash,  facial/tongue/throat swelling, SOB or lightheadedness with hypotension: no Has patient had a PCN reaction causing severe rash involving mucus membranes or skin necrosis: no Has patient had a PCN reaction that required hospitalization no Has patient had a PCN reaction occurring within the last 10 years: no If all of the above answers are "NO", then may proceed with Cephalosporin use.  . Flecainide Acetate Other (See Comments)    "couldn't take it"  . Moxifloxacin     Does not remember the reaction  . Sulfonamide Derivatives     Does not remember reaction ; "was so young when I had reaction to it"    Immunization History  Administered Date(s) Administered  . Influenza Split 07/13/2016  . Influenza-Unspecified 11/04/2012, 08/24/2014  . Zoster 03/24/2013    Past Medical History:  Diagnosis Date  . Anemia    "onc;e; had to take iron for awhile"  . Atrial fibrillation (HCC)    Began in 2004.  Had ablations at [facility] in 2005 and 2007.  She is off coumadin  now with no noted recurrent atrial fibrillation since 2007. til 04/01/12; s/p initiation of Rhythmol with DCCV June 2013  . Carpal tunnel syndrome   . Degenerative disk disease   . GERD (gastroesophageal reflux disease)    resolved after  lap band  . History of blood transfusion    "w/both hip replacements"  . HTN (hypertension)   . Hypothyroidism   . IBS (irritable bowel syndrome)   . Interstitial cystitis   . Morton's neuroma    right foot  . Obesity   . On home oxygen therapy   . Pulmonary hypertension   . Shortness of breath    only without oxygen  . Sleep apnea 04/01/12   "dx'd just last week"  . Sleep apnea    uses CPAP, 11 CWP with residual AHI 6.3    Tobacco History: History  Smoking Status  . Never Smoker  Smokeless Tobacco  . Never Used   Counseling given: Not Answered   Outpatient Encounter Prescriptions as of 01/19/2017  Medication Sig  . acetaminophen-codeine (TYLENOL #3) 300-30 MG tablet Take 1  tablet by mouth every 6 (six) hours as needed. for pain  . amiodarone (PACERONE) 200 MG tablet Take 1 tablet (200 mg total) by mouth daily.  Marland Kitchen amitriptyline (ELAVIL) 10 MG tablet TAKE 1 TABLET BY MOUTH AT  BEDTIME  . cetirizine (ZYRTEC) 10 MG tablet Take 10 mg by mouth daily.  . Cholecalciferol (VITAMIN D) 2000 UNITS tablet Take 2,000 Units by mouth daily.  . diclofenac sodium (VOLTAREN) 1 % GEL Apply 2 g topically 4 (four) times daily.  Marland Kitchen diltiazem (CARDIZEM CD) 120 MG 24 hr capsule Take  by mouth daily  . ELIQUIS 5 MG TABS tablet TAKE 1 TABLET BY MOUTH TWICE A DAY....NEED OFFICE VISIT BEFORE ANY MORE REFILLS  . furosemide (LASIX) 20 MG tablet Take 40 mg by mouth daily.  Marland Kitchen guaiFENesin (MUCINEX) 600 MG 12 hr tablet Take 600 mg by mouth 2 (two) times daily as needed. For allergies.  Marland Kitchen levothyroxine (SYNTHROID, LEVOTHROID) 50 MCG tablet Take 1 tablet by mouth  daily before breakfast  . losartan (COZAAR) 100 MG tablet TAKE 1 TABLET BY MOUTH  DAILY  . Magnesium 250 MG TABS Take 250 mg by mouth 2 (two) times daily.  . metoprolol tartrate (LOPRESSOR) 25 MG tablet Take 25 mg by mouth 2 (two) times daily.   . [DISCONTINUED] oxyCODONE-acetaminophen (ROXICET) 5-325 MG tablet Take 1-2 tablets by mouth every 4 (four) hours as needed. (Patient not taking: Reported on 01/19/2017)  . [DISCONTINUED] valACYclovir (VALTREX) 1000 MG tablet Take 1 tablet (1,000 mg total) by mouth 3 (three) times daily. (Patient not taking: Reported on 01/19/2017)   No facility-administered encounter medications on file as of 01/19/2017.      Review of Systems  Constitutional:   No  weight loss, night sweats,  Fevers, chills,  +fatigue, or  lassitude.  HEENT:   No headaches,  Difficulty swallowing,  Tooth/dental problems, or  Sore throat,                No sneezing, itching, ear ache, nasal congestion, post nasal drip,   CV:  No chest pain,  Orthopnea, PND, swelling in lower extremities, anasarca, dizziness, palpitations,  syncope.   GI  No heartburn, indigestion, abdominal pain, nausea, vomiting, diarrhea, change in bowel habits, loss of appetite, bloody stools.   Resp:    No chest wall deformity  Skin: no rash or lesions.  GU: no dysuria, change in color of urine, no urgency or frequency.  No flank pain, no hematuria   MS:  No joint pain or swelling.  No decreased range of motion.  No back pain.    Physical Exam  BP 110/68 (BP Location: Left Arm,  Cuff Size: Large)   Pulse 86   Ht  (1.651 m)   Wt (!) 309 lb 12.8 oz (140.5 kg)   SpO2 94%   BMI 51.55 kg/m   GEN: A/Ox3; pleasant , NAD, elderly    HEENT:  [place]/AT,  EACs-clear, TMs-wnl, NOSE-clear, THROAT-clear, no lesions, no postnasal drip or exudate noted.   NECK:  Supple w/ fair ROM; no JVD; normal carotid impulses w/o bruits; no thyromegaly or nodules palpated; no lymphadenopathy.    RESP  Decreased BS in bases , . no accessory muscle use, no dullness to percussion  CARD:  RRR, no m/r/g, tr to 1 +  peripheral edema, pulses intact, no cyanosis or clubbing.  GI:   Soft & nt; nml bowel sounds; no organomegaly or masses detected.   Musco: Warm bil, no deformities or joint swelling noted.   Neuro: alert, no focal deficits noted.    Skin: Warm, no lesions or rashes    Lab Results:  CBC    Component Value Date/Time   WBC 5.4 11/18/2016 1233   RBC 3.96 11/18/2016 1233   HGB 11.1 (L) 11/18/2016 1233   HCT 35.0 (L) 11/18/2016 1233   PLT 274 11/18/2016 1233   MCV 88.4 11/18/2016 1233   MCH 28.0 11/18/2016 1233   MCHC 31.7 11/18/2016 1233   RDW 15.0 11/18/2016 1233   LYMPHSABS 837 (L) 10/31/2016 1742   MONOABS 837 10/31/2016 1742   EOSABS 93 10/31/2016 1742   BASOSABS 0 10/31/2016 1742    BMET    Component Value Date/Time   NA 139 11/18/2016 1233   K 4.1 11/18/2016 1233   CL 104 11/18/2016 1233   CO2 26 11/18/2016 1233   GLUCOSE 105 (H) 11/18/2016 1233   BUN 23 (H) 11/18/2016 1233   CREATININE 0.87 11/18/2016 1233    CREATININE 1.21 (H) 03/05/2015 0905   CALCIUM 9.1 11/18/2016 1233   GFRNONAA >60 11/18/2016 1233   GFRAA >60 11/18/2016 1233    BNP    Component Value Date/Time   BNP 145.5 (H) 03/05/2015 0905    ProBNP    Component Value Date/Time   PROBNP 198.0 (H) 04/08/2013 0950    Imaging: No results found.   Assessment & Plan:   Sleep apnea Need CPAP download to look at control and compliance    Pulmonary hypertension ?worsening pulmonary HTN  Check BNP  Check echo .  Cont on O2  Increase lasix x 3 days   Plan  Patient Instructions  Increase Lasix  daily for 3 days .  Low salt diet .  Set up for Echo  Labs today .  Continue on oxygen 2l/m rest and 3l/m activity .  Continue on CPAP At bedtime  With 3l/m .  CPAP download .  Follow up with Dr. Kriste Basque  In 6 weeks and As needed .  Please contact office for sooner follow up if symptoms do not improve or worsen or seek emergency care      Shortness of breath Worsening DOE ? Etiology  Check BNP , echo , cbc  Check cxr - on Amio (doubt -no cough )   Plan Patient Instructions  Increase Lasix  daily for 3 days .  Low salt diet .  Set up for Echo  Labs today .  Continue on oxygen 2l/m rest and 3l/m activity .  Continue on CPAP At bedtime  With 3l/m .  CPAP download .  Follow up with Dr. Kriste Basque  In 6 weeks and As  needed .  Please contact office for sooner follow up if symptoms do not improve or worsen or seek emergency care         Rubye Oaks, NP 01/19/2017

## 2017-01-19 NOTE — Addendum Note (Signed)
Addended by: Boone Master E on: 01/19/2017 05:29 PM   Modules accepted: Orders

## 2017-01-19 NOTE — Telephone Encounter (Signed)
Spoke with pt, who reports of increased sob with any exertion. Denies any cough, wheezing or chest discomfort. Pt is scheduled for acute visit with TP for 01-19-17 at 10:45 Nothing further needed.

## 2017-01-20 NOTE — Progress Notes (Signed)
Called spoke with patient, advised of lab results / recs as stated by TP.  Pt verbalized her understanding and denied any questions. 

## 2017-01-20 NOTE — Progress Notes (Signed)
Called spoke with patient, advised of xray results / recs as stated by TP.  Pt verbalized her understanding and denied any questions.

## 2017-01-22 ENCOUNTER — Encounter: Payer: Self-pay | Admitting: Pulmonary Disease

## 2017-01-29 ENCOUNTER — Other Ambulatory Visit (INDEPENDENT_AMBULATORY_CARE_PROVIDER_SITE_OTHER): Payer: BC Managed Care – PPO

## 2017-01-29 ENCOUNTER — Ambulatory Visit (INDEPENDENT_AMBULATORY_CARE_PROVIDER_SITE_OTHER): Payer: BC Managed Care – PPO | Admitting: Pulmonary Disease

## 2017-01-29 ENCOUNTER — Encounter: Payer: Self-pay | Admitting: Pulmonary Disease

## 2017-01-29 VITALS — BP 124/68 | HR 57 | Temp 96.8°F | Ht 65.0 in | Wt 302.4 lb

## 2017-01-29 DIAGNOSIS — G4733 Obstructive sleep apnea (adult) (pediatric): Secondary | ICD-10-CM

## 2017-01-29 DIAGNOSIS — I272 Pulmonary hypertension, unspecified: Secondary | ICD-10-CM

## 2017-01-29 DIAGNOSIS — Z7901 Long term (current) use of anticoagulants: Secondary | ICD-10-CM

## 2017-01-29 DIAGNOSIS — I480 Paroxysmal atrial fibrillation: Secondary | ICD-10-CM | POA: Diagnosis not present

## 2017-01-29 DIAGNOSIS — E079 Disorder of thyroid, unspecified: Secondary | ICD-10-CM

## 2017-01-29 DIAGNOSIS — R0602 Shortness of breath: Secondary | ICD-10-CM

## 2017-01-29 DIAGNOSIS — I10 Essential (primary) hypertension: Secondary | ICD-10-CM

## 2017-01-29 DIAGNOSIS — I5032 Chronic diastolic (congestive) heart failure: Secondary | ICD-10-CM

## 2017-01-29 LAB — COMPREHENSIVE METABOLIC PANEL
ALK PHOS: 100 U/L (ref 39–117)
ALT: 15 U/L (ref 0–35)
AST: 18 U/L (ref 0–37)
Albumin: 4.3 g/dL (ref 3.5–5.2)
BILIRUBIN TOTAL: 0.6 mg/dL (ref 0.2–1.2)
BUN: 28 mg/dL — ABNORMAL HIGH (ref 6–23)
CO2: 29 meq/L (ref 19–32)
CREATININE: 1.04 mg/dL (ref 0.40–1.20)
Calcium: 9.8 mg/dL (ref 8.4–10.5)
Chloride: 101 mEq/L (ref 96–112)
GFR: 56.6 mL/min — AB (ref >60.00)
Glucose, Bld: 107 mg/dL — ABNORMAL HIGH (ref 70–99)
Potassium: 4 mEq/L (ref 3.5–5.1)
Sodium: 140 mEq/L (ref 135–145)
TOTAL PROTEIN: 8.1 g/dL (ref 6.0–8.3)

## 2017-01-29 LAB — TSH: TSH: 5.37 u[IU]/mL — ABNORMAL HIGH (ref 0.35–4.50)

## 2017-01-29 LAB — SEDIMENTATION RATE: Sed Rate: 122 mm/hr — ABNORMAL HIGH (ref 0–30)

## 2017-01-29 LAB — BRAIN NATRIURETIC PEPTIDE: Pro B Natriuretic peptide (BNP): 158 pg/mL — ABNORMAL HIGH (ref 0.0–100.0)

## 2017-01-29 MED ORDER — PREDNISONE 20 MG PO TABS: ORAL_TABLET | ORAL | 1 refills | Status: DC

## 2017-01-29 NOTE — Patient Instructions (Addendum)
Today we updated your med list in our EPIC system...    Continue your current medications the same...  Today we rechecked some follow up blood work & an ambulatory oxygen staturation test... Marland Kitchen.We will contact you w/ the results when available...   We need the CPAP data & the 2DEcho data to proceed...    We will contact you w/ the results when available & then decide how to proceed...  In the interim we will start a course of oral PREDNISONE  tabs >>    Start w/ one tab twice daily for 1 week...    Then decrease to 1 tab daily each AM for 1 week...    Then decrease to 1/2 tab daily each AM til return visit in 3-4 weeks here...  We will arrange for an bubble humidifier on your O2 concentrator at home (3L/min at rest), and for a spare battery for your Simply-Go mini which should be at 5L/min pulse dose oxygen when ambulating...  Call for any questions.Marland KitchenMarland Kitchen

## 2017-01-29 NOTE — Progress Notes (Signed)
Subjective:     Patient ID: Heather [name], female   DOB: October 25, 1950, 66 y.o.   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   ~  March 22, 2015:  Initial pulmonary consult by SN>        66 y/o WF, referred by DrFitzgerald for a pulmonary evaluation due to dyspnea and pulmonary hypertension per 2DEcho>  Heather [name] notes SOB/ DOE for over a year now, she has DOE w/ ADLs gradually worse over that time; she denies cough, sputum, hemoptysis; she denies CP, palpit, dizzy/syncope; she has VI w/ periph edema... She was sent here for a pulm eval due to SOB/DOE w/ any activ and a 2DEcho done 02/14/15 in [place] showing norm LV size & function w/ EF=60-65% & norm wall motion, mild MR, mod LA&RA dil, norm RVF w/ severe pulmHTN (RVSP=80); prev 2DEcho 04/13/13 showed norm LVF w/ EF=55-60%, norm wall motion, no signif valve dis, mild-mod LAdil at 40m and PAsys=344mg; as far as causes for secondary pulmonaryHTN-- she has severe morbid obesity ([facility] OSA on CPAP per her PCP, mild-mod restrictive lung dis likely related to her massive obesity, and she needs additional work up to rule out other contributing factors...       Her [facility] she is followed for Morbid Obesity & OSA on CPAP- pt reports a 3 yr hx OSA, diagnosed by DrPickard on a sleep study and treated by him w/ CPAP; the only avail [place]SA w/ AHI=25/hr (52/hr in REM), and RDI=29/hr (54/hr in REM), assoc w/ oxygen desat to 76%, mod snoring, no limb movements- events noted to be worse supine & during REM;  She was supposed to have a CPAP tiration study but I can't find it in Epic, neither  can I find download data or a subseq ONO... [facility]>   <01/11/15> c/o SOB (tight & heavy in her chest), cough, wheezing, felt to have an asthma exac- given Pred, AlbutHFA, nasonex...  <01/29/15> c/o persistent wheezing & SOB, plus water retention from the Pred, & she was given Pulmicort-2spBid...   <02/28/15> f/u visit & recent CXR said to show incr edema so they increased Lasix & referred to Cards- DrFitzgerald...      [place] Dr[place]red [place]> she has hx HBP, AFib first in 2004 ([facility] back in sinus rhythm from 2007-2013 & then 6 different cardioversions 2013-2015); last note from DrAllred was 02/03/14- she had cardioversion 12/2013 w/ improved energy but then went back into her atypical AFlutter; at that visit she denied CP, palpit, SOB, and he reported no edema; since she failed several antiarrhythmic meds & DCCV, she was rec to consider another ablation w/ DrFitzgerald... Last note from DrFitzgerald in Epic was 02/01/15- f/u from another cardioversion, on Amio & dose decr from 400=>300 due to elev level, pt holding NSR...        EXAM showed Afeb, VSS, O2sat=97% on RA at rest; Wt=356#, 5'5"Tall, BMI=59;  HEENT- neg, mallampati2;  Chest- decr BS at bases, w/o w/r/r;  Heart- RR gr1/6 SEM w/o r/g;  Abd- obese, neg;  Ext- VI w/ 4+edema...  CT Angio chest 03/06/09 (done for SOB & AFib) showed no PE, mild cardiomeg, otherw neg as well...  Sleep Study in [place] was 03/25/12 at the [facility] [place], [place]- it showed severe OSA w/ AHI=25/hr (52/hr in REM), and RDI=29/hr (54/hr in REM), assoc w/ oxygen desat to 76%, mod snoring, no limb movements- events noted to be worse supine & during REM...  Lexiscan Myoview 04/08/12 showed norm EF=60%, norm wall motion, soft tissue attenuation & no ischemia...  TEE 12/29/12 showed decr LVF w/ EF=40-45%, diffuse HK, no vegetations, no LA clot, no PFO or R=>L shunt, norm  RV...  2DEcho 04/13/13 showed norm LVF w/ EF=55-60%, norm wall motion, no signif valve dis, mild-mod LAdil at 46m and PAsys=370mg...  PFT 12/16/13 showed FVC=2.16 (64%), FEV1=1.70 (66%), %1sec=79, mid-flows are reduced at 63% predicted; Lung Vols are mildly reduced (TLC at 73%); DLCO mildly reduced at 74% predicted (VA is similarly reduced)...   CPAP Titration Study 06/12/14>  CPAP titrated to 11cmH2O w/ AHI=6.3/hr (titration lim by pt's inability to maintain sleep); she used nasal pillows, mean O2sat=93.4%, NSR w/ PACs, no signif movements,   CXR 01/30/15 showed cardiomegaly & prominent central pulm vascularity, mild incr in interstitial markings, no effusion, NAD...  [facility] [place] showed norm LV size & function w/ EF=60-65% & norm wall motion, mild MR, mod LA&RA dil, norm RVF w/ severe pulmHTN (RVSP=80)...  Ambulatory oxygen saturation test 03/22/15> O2sat=97% on RA at rest w/ pulse=54/min; she walked only 1 lap in the office & stopped due to fatigue & SOB- lowest O2sat=86% w/ pulse=83/min...  LABS 5-03/2015:  Chems- ok x BS=128, Cr=1.21, A1c=6.1;  CBC- Hg=11.2, MCV=86;  BNP=146;  TSH=9.71 (FreeT3=2.4, FreeT4=1.02)... Note Amio level 01/26/15=3.5 (1.0-2.5).  IMP/PLAN>>  Heather [name] severe SOB/DOE w/ ADLs and recent 2DEcho in [place] showing severe incr PAsys=802m;  The likely etiology of secondary pulmHTN is her severe morbid obesity (BMI=59), severe OSA & hypoxemia (see above);  [facility] & Epic shows f/u visits for 655yr55yr none since 2010 & she clearly didn't loose any weight ([facility] will [facility] and to consider further options- ?gastric sleeve?);  She indicates that DrPickard started CPAP (she doesn't know the settings) & I cannot find orders or download data in Epic (we will call [facility] data);  Ambulatory O2 sat here today dropped to 86% on RA after one lap- she needs O2 w/ exercise and needs an ONO as well once we see the download data  regarding compliance; consider ABG to r/o OHS... Improving her PA pressure is likely going to depend on appropriate CPAP management, relieving hypoxemia, and losing weight...   ADDENDUM>>  Started on Home oxygen at 2L/min [place] => 24/7 due to her severe pulmHTN ADDENDUM>> We received download data from [facility] w/ humidifier)- good compliance w/ CPAP: 90/90 days (Apr-Jun2016), 7-8hrs per night, CPAP=11, AHI on CPAP=1.5... AMarland KitchenMarland KitchenENDUM>>  ONO on CPAP, on O2 at 2L/min => pending (it was done 04/05/15 ON ROOM AIR w/ desat to 74%, <88% for 3hrs) ADDENDUM>>  04/12/15- [facility] [place]- note reviewed, wt=363#, BMI=59.5, they added 0.5cc to lap-band (total 5.0cc), reviewed diet/ nutrition consult etc & goal <300# or consider additional surg. ADDENDUM>>  04/17/15- ONO on O2 at 2L/min & CPAP showed she was still desaturating to <88% for 49.5min23mDesat events <88%=41 (Desat index of 11/hr); WE WILL  INCR NOCTURNAL O2 to 3L/min  ~  June 11, 2016:  46moROV & pulmonary [facility]>  After initial pulmonary consult 03/22/15 pt started on O2 and had the indicated CPAP download and ONO data but never returned to our office for recheck;  She was last increased to 3L/min added to her CPAP for the nocturnal hypoxemia and is now in need of another ONO to prove that this is adeq to keep her O2sats >90%, and she needs a 2DEcho to reassess her PAsys pressures... DrNewman is planning further Bariatiric surg in the near future & needs pulmonary surgical clearance...     She saw DrFitzgerald, Cards 06/03/16 & note reviewed in Care Everywhere>  HBP, PAF on Amio & s/p mult ablation procedures, OSA, PulmHTN- on Eliquis5Bid, Amio200, Metop25Bid, CardizemCD120, Losartan100, Lasix20-2Qam; she is holding NSR and cleared for bariatric surg by Cards;  NOTE-- Care Everywhere records indicate that she had a f/u 2DEcho [id number] the results are NOT avail & not referenced in any of  DrFitzgerald's cardiac notes (we need f/u 2DEcho to re-assess her est PAsys pressure while on adeq O2 supplementation...     From the pulmonary standpoint Heather [name] improved- less SOB but still w/ DOE after walking "long distances", she is again singing in the choir but needs her oxygen; she notes sl cough, denies sput production/ hemoptysis/ CP etc; We reviewed the following medical problems during today's office visit >>     Severe DYSPNEA>  She reports sl improved w/ wt loss so far & home oxygen therapy...    Nocturnal & exercise hypoxemia>  She desats to 86% on RA w/ min walking & signif O2 desats Qhs despite CPAP=> O2 incr to 3L/min bled into machine.    Pulmonary HTN>  2DEcho in [place] by DrFitagerald 5/16 indicated PAsys~80 (in 2014 it was ~34)...    OSA>  Initial sleep study by DrPickard & CPAP prescribed & followed by him; CPAP download from spring2016 showed AHI=1.5 on CPAP 11...    Morbid Obesity> Wt=356 lbs in Jun2016 & she is down to 311 lbs today; DFranki Monteis planning to convert her Lap band to Sleeve gastrectomy soon...    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    HBP>  On Metop25Bid, CardizenCD120, Losar100, Lasix40 & BP=128/74...    PAF>  On Eliquis5Bid & Amio200; she has had numerous ablation procedures; now holding NSR on the amio...    Ven Insuffic/ Edema>  On Lasix40 & the low salt diet...    Hypothyroid>  On Synthroid100 w/ TSH followed by DrPickard... EXAM shows pear-shaped body habitus, Afeb, VSS, O2sat=99% on RA at rest; Wt=311#, 5'5"Tall, BMI=52;  HEENT- neg, mallampati2;  Chest- decr BS at bases, w/o w/r/r;  Heart- RR gr1/6 SEM w/o r/g;  Abd- obese, neg;  Ext- VI w/ 1+edema...  Ambulatory Oximetry on 2L/min [place]>  O2sat=100% on 2L/min at rest w/ pulse=58/min;  She was able to ambulate just 1Lap (185') on the 2L O2 w/ lowest sat=100% w/ pulse=88/min... IMP/PLAN>>  We need to obtain an ONO on her 3L/min flow to prove  that she is no longer desaturating at night on her CPAP; it would also be instructive for a repeat 2DEcho to recheck her PAsys est pressure; she is OK for the surgery which we hope will be life saving given these life threatening complications from her morbid obesity...   ADDENDUM>>  06/30/16- ONO on O2 at  3L/min & her CPAP showed NO DESATURATIONS- all O2sat>90% on 3L/min SHE STILL NEEDS A FOLLOW UP 2DEcho TO RECHECK HER ESTIMATED PAsys PRESSURE and assess benefit from the O2   ~  January 29, 2017:  24moROV & pulmonary follow up visit>  CCortezis here w/ her husb today- she tells me that her insurance would not approve the Bariatic Surg (gastric sleeve) since she had already had the LapBand & didn't follow up w/ DrNewman on that one;  [facility] but [facility] was again refused & I registered my sincere disappointment as I viewed the Sleeve surg as life-saving for her w/ her morbid obesity, OSA and severe pulm hypertension;  She never followed up w/ uKoreaeither but has remained faithful to her CPAP and OXYGEN therapy;  Recently she noted an increase in DYSPNEA and had a follow up visit w/ her Cardiologist- [place]Fitzgerald [place](01/26/17 note reviewed)> HBP, PAF/ Flutter on Amio w/ mult prior ablation procedures, Morbid Obesity, OSA, & PulmHTN>  CXR showed incr interstitial markings suggesting CHF & her BNP was sl elev at 250;  Her Lasix was increased & she was sched for a f/u 2DEcho, the extra Lasix seemed to help the dyspnea;  DrFitsgerald stopped the Amio & placed her on Multaq, she remains on Eliquis as well, he wanted uKoreato recheck pt to see if she would benefit from Pred...     She notes 159mox incr SOB/DOE, notes she hasn't been able to walk as far or as long, then suddenly much worse w/ incr SOB w/ less activity; mild cough, occas mild sput production (no color, no blood), she denies CP, palpit, but notes sl incr edema as noted... She hasn't seen drPickard in ~1y22yrCXR 01/19/17 showed cardiomegaly,  bilat diffuse interstitial thickening (new from 01/2015)- no focal consolidation, effusion, etc;  Note> she went to see Ortho 10/2016 (DrDean & BlaNinfa Linden/ right knee pain & Sed elev at 88 & CRP was a sky-high 286; she prev received steroid shots 7 arthroscopic surg- was given Synvisc injection this time;  We discussed need for further eval & ILD work-up>>     EXAM shows pear-shaped body habitus, Afeb, VSS, O2sat=91% on RA at rest; Wt=303#, 5'5"Tall, BMI=51;  HEENT- neg, mallampati2;  Chest- decr BS at bases, w/o w/r/r;  Heart- RR gr1/6 SEM w/o r/g;  Abd- obese, neg;  Ext- VI w/ 1+edema...  LABS 10/31/16 by DrSDean, Ortho>  Sed=88, CRP=286  CPAP Download 3/14 - 01/22/17>  Excellent compliance- used 30/30 days for 8+hrs per night, CPAP set 11, min air leak, AHI=1.3 per hr... Continue same.  CXR 01/19/17>  Cardiomegaly, bilat diffuse interstitial thickening (new from 01/2015)- no focal consolidation, effusion, etc...  LABS 01/2017>  Chems- wnl w/ BS=105, Cr=1.03, BNP=250=>158 (after incr Lasix dose);  CBC- ok w/ borderline Hg=12.5, mcv=87;  TSH=5.37 on Levothy50 (rec to inc to 47m57m);    Collagen-Vasc screen 01/2017>  Quantiferon Gold= INDETERM;  Sed=122;  RA factor= NEG (<14);  Anti-CCP= NEG (<16);  ANA=neg;  ANCA=NEG;  ACE=76 (9-67);  Sjogrens SSA (Ro) & SSB (La)= NEG;  Scleroderma Scl-70= NEG;  CPK= 47 (7-177);  RNP= +6.5 (nl<1.0) => Pending:  dsDNA antibodies and Sm Antibodies  2DEcho 02/03/17> norm LV w/ EF=60-65% & no regional wall motion abn, +Gr2DD, Ao root is wnl, AoV ok w/ mild AI, MV ok w/ trivMR, LA normal, RV normal, RA mod dil & PAsys~65mm70mHi-res CT Chest 02/09/17> cardiomeg, Ao atherosclerosis,  ectatic ascending Ao ~4cm & no ch from 2010, dilated main PA at 3.8cm (prev 3.4);  Few mildly enlarged mediastinal nodes (largest1.5cm right paratrach area);  Numerous poorly marginated subsolid nodules bilaterally (most prom in LL's- eg 1.2cm RLL & 1.0cm LLL); note- no subpleural reticulation, traction  bronchiectasis, parenchymal banding, architectural distortion or honeycombing); mild interlobar septal thickening (suggesting mild pulm edema) +thoracic spndylosis... Overall pattern suggesting infections vs atypic inflamm etiologies like COP (cryptogenic organizing pneumonitis)...  IMP/PLAN>>  Hope has a complex medical picture w/ underlying Morbid Obesity, Restrictive pulm dis, chronic hypoxemic resp failure on O2, & OSA on CPAP=> causing secondary pulm HTN (Prob WHO Group 3);  On effective CPAP therapy & 24/7 O2 w/ resolution of her hypoxemia- her PA pressure has improved from ~80 (02/2015) to ~65 at present, unfortunately she was turned down by her insurance company for further Bariatric surg as I still believe that a successful gastric sleeve procedure could prove life saving!         Now she presents w/ a superimposed acute dyspnea w/ CXR abn thought to suggest early CHF & a BNP=250 + 2DEcho w/ good LVF but Gr2DD, placed on Lasix w/ only min improvement;  Note recent Ortho eval w/ very high inflamm markers- treated w/ synvisc;  Further Pulm eval reveals Hi-res CT Chest suggesting COP & collagen vasc screen w/ a few unexplained random marker abn (eg- neg ANA & ANCA but pos RNP, plus the ACE level is sl elev & inflamm markers remain very high (sed=122);  She was placed on empiric trial of PRED pending some of our studies and she's had a good clinical response w/ improvement in her breathing (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  We will have her ret in 71moto assess response & recheck labs.      Note that CARDS- DrFitzgerald has stopped her Amio in favor of Multaq for her PAF & she continues to hold NSR... Note: >50% of this 629m appt was spent in counseling & coordination of care...    Past Medical History:  Diagnosis Date  . Anemia    "onc;e; had to take iron for awhile"  . Atrial fibrillation ([place]   Began in 2004.  [facility].  She is off coumadin   now with no noted recurrent atrial fibrillation since 2007. til 04/01/12; s/p initiation of Rhythmol with DCCV June 2013  . Carpal tunnel syndrome   . Degenerative disk disease   . GERD (gastroesophageal reflux disease)    resolved after lap band  . History of blood transfusion    "w/both hip replacements"  . HTN (hypertension)   . Hypothyroidism   . IBS (irritable bowel syndrome)   . Interstitial cystitis   . Morton's neuroma    right foot  . Obesity   . On home oxygen therapy   . Pulmonary hypertension (HCOpelousas  . Shortness of breath    only without oxygen  . Sleep apnea 04/01/12   "dx'd just last week"  . Sleep apnea    uses CPAP, 11 CWP with residual AHI 6.3    Past Surgical History:  Procedure Laterality Date  . ANTERIOR AND POSTERIOR REPAIR  10/20/2012   Procedure: ANTERIOR (CYSTOCELE) AND POSTERIOR REPAIR (RECTOCELE);  Surgeon: SaAlwyn PeaMD;  [place] [facility];  Service: Gynecology;  Laterality: N/A;  2 hours  . ATRIAL FIBRILLATION ABLATION N/A 12/30/2012   Procedure: ATRIAL FIBRILLATION ABLATION;  Surgeon:  Thompson Grayer, MD;  Location: [facility] CATH LAB;  Service: Cardiovascular;  Laterality: N/A;  . BACK SURGERY    . CARDIAC CATHETERIZATION    . CARDIAC ELECTROPHYSIOLOGY MAPPING AND ABLATION  ~ 2005 and 2007   "did more 2nd time; both at [facility]"  . CARDIOVERSION  04/02/2012   Procedure: CARDIOVERSION;  Surgeon: Larey Dresser, MD;  Location: [place];  Service: Cardiovascular;  Laterality: N/A;  . CARDIOVERSION  06/16/2012   Procedure: CARDIOVERSION;  Surgeon: Thayer Headings, MD;  Location: [place];  Service: Cardiovascular;  Laterality: N/A;  amanda/ebp/Beverly( or scheduling)  . CARDIOVERSION  10/29/2012   Procedure: CARDIOVERSION;  Surgeon: Larey Dresser, MD;  Location: [facility] ENDOSCOPY;  Service: Cardiovascular;  Laterality: N/A;  . CARDIOVERSION N/A 03/30/2013   Procedure: CARDIOVERSION;  Surgeon: Larey Dresser, MD;  Location: [place];  Service:  Cardiovascular;  Laterality: N/A;  . CARDIOVERSION N/A 12/23/2013   Procedure: CARDIOVERSION;  Surgeon: Dorothy Spark, MD;  Location: [place];  Service: Cardiovascular;  Laterality: N/A;  9:27 Propofol 37m, IV   150 joules synched shock by Dr. NMeda Coffee@ 150 joules...SR   post 12 lead ordered.   . CARPAL TUNNEL RELEASE  1990's   bilaterally  . DILATION AND CURETTAGE OF UTERUS  2000  . JOINT REPLACEMENT     bilateral hips  . KNEE ARTHROSCOPY Right 11/18/2016   Procedure: RIGHT KNEE ARTHROSCOPY WITH DEBRIDEMENT;  Surgeon: CMcarthur Rossetti MD;  [place]  Service: Orthopedics;  Laterality: Right;  . LAPAROSCOPIC GASTRIC BANDING  2009  . LASIK    . POSTERIOR FUSION LUMBAR SPINE  2000's   "nerve problems"  . POSTERIOR FUSION LUMBAR SPINE  2000's  . TEE WITHOUT CARDIOVERSION N/A 12/29/2012   Procedure: TRANSESOPHAGEAL ECHOCARDIOGRAM (TEE);  Surgeon: Peter M JMartinique MD;  [place]  Service: Cardiovascular;  Laterality: N/A;  . [place] TOTAL HIP ARTHROPLASTY  ~2009; 2010   right; left  . TUBAL LIGATION  1980  . VAGINAL HYSTERECTOMY  2000    Outpatient Encounter Prescriptions as of 01/29/2017  Medication Sig  . acetaminophen-codeine (TYLENOL #3) 300-30 MG tablet Take 1 tablet by mouth every 6 (six) hours as needed. for pain  . amitriptyline (ELAVIL) 10 MG tablet TAKE 1 TABLET BY MOUTH AT  BEDTIME  . cetirizine (ZYRTEC) 10 MG tablet Take 10 mg by mouth daily.  . Cholecalciferol (VITAMIN D) 2000 UNITS tablet Take 2,000 Units by mouth daily.  . diclofenac sodium (VOLTAREN) 1 % GEL Apply 2 g topically 4 (four) times daily.  .Marland Kitchendiltiazem (CARDIZEM CD) 120 MG 24 hr capsule Take 1220mby mouth daily  . dronedarone (MULTAQ) 400 MG tablet Take 400 mg by mouth 2 (two) times daily with a meal.  . ELIQUIS 5 MG TABS tablet TAKE 1 TABLET BY MOUTH TWICE A DAY....NEED OFFICE VISIT BEFORE ANY MORE REFILLS  . furosemide (LASIX) 20 MG tablet Take  40 mg by mouth daily.  . Marland KitchenuaiFENesin (MUCINEX) 600 MG 12 hr tablet Take 600 mg by mouth 2 (two) times daily as needed. For allergies.  . Marland Kitchenevothyroxine (SYNTHROID, LEVOTHROID) 50 MCG tablet Take 1 tablet by mouth  daily before breakfast  . losartan (COZAAR) 100 MG tablet TAKE 1 TABLET BY MOUTH  DAILY  . Magnesium 250 MG TABS Take 250 mg by mouth 2 (two) times daily.  . metoprolol tartrate (LOPRESSOR) 25 MG tablet Take 25 mg by mouth 2 (two) times daily.   .Marland Kitchen  predniSONE (DELTASONE) 20 MG tablet Take as directed  . [DISCONTINUED] amiodarone (PACERONE) 200 MG tablet Take 1 tablet (200 mg total) by mouth daily. (Patient not taking: Reported on 01/29/2017)   No facility-administered encounter medications on file as of 01/29/2017.     Allergies  Allergen Reactions  . Cephalexin Hives and Other (See Comments)    "throat started closing up"  . Rocephin [Ceftriaxone Sodium In Dextrose] Anaphylaxis  . Penicillins Rash    Has patient had a PCN reaction causing immediate rash, facial/tongue/throat swelling, SOB or lightheadedness with hypotension: no Has patient had a PCN reaction causing severe rash involving mucus membranes or skin necrosis: no Has patient had a PCN reaction that required hospitalization no Has patient had a PCN reaction occurring within the last 10 years: no If all of the above answers are "NO", then may proceed with Cephalosporin use.  . Flecainide Acetate Other (See Comments)    "couldn't take it"  . Moxifloxacin     Does not remember the reaction  . Sulfonamide Derivatives     Does not remember reaction ; "was so young when I had reaction to it"    Immunization History  Administered Date(s) Administered  . Influenza Split 07/13/2016  . Influenza-Unspecified 11/04/2012, 08/24/2014  . Zoster 03/24/2013    Current Medications, Allergies, Past Medical History, Past Surgical History, Family History, and Social History were reviewed in [employer]  record.   Review of Systems            All symptoms NEG except where BOLDED >>  Constitutional:  F/C/S, fatigue, anorexia, unexpected weight change. HEENT:  HA, visual changes, hearing loss, earache, nasal symptoms, sore throat, mouth sores, hoarseness. Resp:  cough, sputum, hemoptysis; SOB, tightness, wheezing. Cardio:  CP, palpit, DOE, orthopnea, edema. GI:  N/V/D/C, blood in stool; reflux, abd pain, distention, gas. GU:  dysuria, freq, urgency, hematuria, flank pain, voiding difficulty. MS:  joint pain, swelling, tenderness, decr ROM; neck pain, back pain, etc. Neuro:  HA, tremors, seizures, dizziness, syncope, weakness, numbness, gait abn. Skin:  suspicious lesions or skin rash. Heme:  adenopathy, bruising, bleeding. Psyche:  confusion, agitation, sleep disturbance, hallucinations, anxiety, depression suicidal.   Objective:   Physical Exam      Vital Signs:  Reviewed...   General:  WD, morbidly obese, 66 y/o WF in NAD; alert & oriented; pleasant & cooperative... HEENT:  /AT; Conjunctiva- pink, Sclera- nonicteric, EOM-wnl, PERRLA, EACs-clear, TMs-wnl; NOSE-clear; THROAT-clear & wnl.  Neck:  Supple w/ decr ROM; no JVD; normal carotid impulses w/o bruits; no thyromegaly or nodules palpated; no lymphadenopathy.  Chest:  decr BS bilat at bases, but clear without wheezes, rales, or rhonchi heard. Heart:  Regular Rhythm; gr1/6SEM without rubs or gallops detected. Abdomen:  Obese, soft & nontender- no guarding or rebound; normal bowel sounds; no organomegaly or masses palpated. Ext:  decrROM; +arthritic changes; no varicose veins, +venous insuffic, 2+ edema;  Pulses intact w/o bruits. Neuro:  No focal neuro deficits, +gait abnormality & balance fair... Derm:  No lesions noted; no rash etc. Lymph:  No cervical, supraclavicular, axillary, or inguinal adenopathy palpated.   Assessment:      IMP >>     Severe DYSPNEA>  She reports sl improved w/ wt loss so far & home oxygen  therapy...    Nocturnal & exercise hypoxemia>  She desats to 86% on RA w/ min walking & signif O2 desats Qhs despite CPAP=> O2 incr to 3L/min bled into machine.  Pulmonary HTN>  2DEcho in [place] by DrFitagerald 5/16 indicated PAsys~80 (in 2014 it was ~34)...    OSA>  Initial sleep study by DrPickard & CPAP prescribed & followed by him; CPAP download from spring2016 showed AHI=1.5 on CPAP 11...    Morbid Obesity> Wt=356 lbs in Jun2016 & she is down to 311 lbs today; Franki Monte is planning to convert her Lap band to Sleeve gastrectomy soon...    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    HBP>  On Metop25Bid, CardizenCD120, Losar100, Lasix40 & BP=128/74...    PAF>  On Eliquis5Bid & Amio200; she has had numerous ablation procedures; now holding NSR on the amio...    Ven Insuffic/ Edema>  On Lasix40 & the low salt diet...    Hypothyroid>  On Synthroid100 w/ TSH followed by DrPickard...  PLAN >>  03/22/15>   Donzella has severe SOB/DOE w/ ADLs and recent 2DEcho in [place] showing severe incr PAsys=23mHg;  The likely etiology of secondary pulmHTN is her severe morbid obesity (BMI=59), severe OSA & hypoxemia (see above);  [facility] & Epic shows f/u visits for [place]ut none since 2010 & she clearly didn't loose any weight ([facility] and to consider further options- ?gastric sleeve?);  She indicates that DrPickard started CPAP (she doesn't know the settings) & I cannot find orders or download data in Epic ([facility] will [facility] [place]);  Ambulatory O2 sat here today dropped to 86% on RA after one lap- she needs O2 w/ exercise and needs an ONO as well once we see the download data regarding compliance; consider ABG to r/o OHS... Improving her PA pressure is likely going to depend on appropriate CPAP management, relieving hypoxemia, and losing weight. 06/11/16>   We need to obtain an ONO on her 3L/min flow to prove that  she is no longer desaturating at night on her CPAP; it would also be instructive for a repeat 2DEcho to recheck her PAsys est pressure; she is OK for the surgery which we hope will be life saving given these life threatening complications from her morbid obesity.  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       Now she presents w/ a superimposed acute dyspnea w/ CXR abn thought to suggest early CHF & a BNP=250 + 2DEcho w/ good LVF but Gr2DD, placed on Lasix w/ only min improvement;  Note recent Ortho eval w/ very high inflamm markers- treated w/ synvisc;  Further Pulm eval reveals Hi-res CT Chest suggesting COP & collagen vasc screen w/ a few unexplained random marker abn (eg- neg ANA & ANCA but pos RNP, plus the ACE level is sl elev & inflamm markers remain very high (sed=122);  She was placed on empiric trial of PRED pending some of our studies and she's had a good clinical response w/ improvement in her breathing (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  We will have her ret in 16m58mo assess  response & recheck labs.     Plan:     Patient's Medications  New Prescriptions   PREDNISONE (DELTASONE) 20 MG TABLET    Take as directed  Previous Medications   ACETAMINOPHEN-CODEINE (TYLENOL #3) 300-30 MG TABLET    Take 1 tablet by mouth every 6 (six) hours as needed. for pain   AMITRIPTYLINE (ELAVIL) 10 MG TABLET    TAKE 1 TABLET BY MOUTH AT  BEDTIME   CETIRIZINE (ZYRTEC) 10 MG TABLET    Take 10 mg by mouth daily.   CHOLECALCIFEROL (VITAMIN D)  2000 UNITS TABLET    Take 2,000 Units by mouth daily.   DICLOFENAC SODIUM (VOLTAREN) 1 % GEL    Apply 2 g topically 4 (four) times daily.   DILTIAZEM (CARDIZEM CD) 120 MG 24 HR CAPSULE    Take 124m by mouth daily   DRONEDARONE (MULTAQ) 400 MG TABLET    Take 400 mg by mouth 2 (two) times daily with a meal.   ELIQUIS 5 MG TABS TABLET    TAKE 1 TABLET BY MOUTH TWICE A DAY....NEED OFFICE VISIT BEFORE ANY MORE REFILLS   FUROSEMIDE (LASIX) 20 MG TABLET    Take 40 mg by mouth daily.   GUAIFENESIN (MUCINEX) 600 MG 12 HR TABLET    Take 600 mg by mouth 2 (two) times daily as needed. For allergies.   LEVOTHYROXINE (SYNTHROID, LEVOTHROID) 50 MCG TABLET    Take 1 tablet by mouth  daily before breakfast   LOSARTAN (COZAAR) 100 MG TABLET    TAKE 1 TABLET BY MOUTH  DAILY   MAGNESIUM 250 MG TABS    Take 250 mg by mouth 2 (two) times daily.   METOPROLOL TARTRATE (LOPRESSOR) 25 MG TABLET    Take 25 mg by mouth 2 (two) times daily.   Modified Medications   No medications on file  Discontinued Medications   AMIODARONE (PACERONE) 200 MG TABLET    Take 1 tablet (200 mg total) by mouth daily.

## 2017-01-30 ENCOUNTER — Other Ambulatory Visit (INDEPENDENT_AMBULATORY_CARE_PROVIDER_SITE_OTHER): Payer: BC Managed Care – PPO

## 2017-01-30 ENCOUNTER — Telehealth (INDEPENDENT_AMBULATORY_CARE_PROVIDER_SITE_OTHER): Payer: Self-pay | Admitting: Orthopaedic Surgery

## 2017-01-30 DIAGNOSIS — I272 Pulmonary hypertension, unspecified: Secondary | ICD-10-CM | POA: Diagnosis not present

## 2017-01-30 DIAGNOSIS — R0602 Shortness of breath: Secondary | ICD-10-CM

## 2017-01-30 LAB — CK: Total CK: 47 U/L (ref 7–177)

## 2017-01-30 MED ORDER — LEVOTHYROXINE SODIUM 75 MCG PO TABS: 75.0000 ug | ORAL_TABLET | Freq: Every day | ORAL | 11 refills | Status: DC

## 2017-01-30 NOTE — Telephone Encounter (Signed)
Heather [name] with [facility] called advised delivery of Syncvisc One will be 02/05/17.  The number to contact patient is [phone]

## 2017-02-02 LAB — ANTI-SCLERODERMA ANTIBODY: SCLERODERMA (SCL-70) (ENA) ANTIBODY, IGG: NEGATIVE

## 2017-02-02 LAB — SJOGREN'S SYNDROME ANTIBODS(SSA + SSB)
SSA (Ro) (ENA) Antibody, IgG: <1
SSB (La) (ENA) Antibody, IgG: <1

## 2017-02-02 LAB — QUANTIFERON TB GOLD ASSAY (BLOOD)
MITOGEN-NIL SO: 0.08 [IU]/mL
Quantiferon Nil Value: 0.02 IU/mL
Quantiferon Tb Ag Minus Nil Value: 0 IU/mL

## 2017-02-02 LAB — ANGIOTENSIN CONVERTING ENZYME: Angiotensin-Converting Enzyme: 76 U/L — ABNORMAL HIGH (ref 9–67)

## 2017-02-02 LAB — ANA: Anti Nuclear Antibody(ANA): NEGATIVE

## 2017-02-02 LAB — ANCA SCREEN W REFLEX TITER: ANCA SCREEN: NEGATIVE

## 2017-02-02 LAB — CYCLIC CITRUL PEPTIDE ANTIBODY, IGG

## 2017-02-02 LAB — RNP ANTIBODY: RIBONUCLEIC PROTEIN(ENA) ANTIBODY, IGG: POSITIVE — AB

## 2017-02-02 LAB — RHEUMATOID FACTOR

## 2017-02-02 NOTE — Telephone Encounter (Signed)
Patient has appt in May.  

## 2017-02-03 ENCOUNTER — Ambulatory Visit (HOSPITAL_COMMUNITY): Payer: BC Managed Care – PPO | Attending: Cardiovascular Disease

## 2017-02-03 ENCOUNTER — Other Ambulatory Visit: Payer: Self-pay

## 2017-02-03 DIAGNOSIS — I361 Nonrheumatic tricuspid (valve) insufficiency: Secondary | ICD-10-CM | POA: Insufficient documentation

## 2017-02-03 DIAGNOSIS — I351 Nonrheumatic aortic (valve) insufficiency: Secondary | ICD-10-CM | POA: Diagnosis not present

## 2017-02-03 DIAGNOSIS — I272 Pulmonary hypertension, unspecified: Secondary | ICD-10-CM | POA: Diagnosis not present

## 2017-02-03 DIAGNOSIS — R0602 Shortness of breath: Secondary | ICD-10-CM

## 2017-02-03 DIAGNOSIS — I119 Hypertensive heart disease without heart failure: Secondary | ICD-10-CM | POA: Diagnosis not present

## 2017-02-03 DIAGNOSIS — Z6841 Body Mass Index (BMI) 40.0 and over, adult: Secondary | ICD-10-CM | POA: Insufficient documentation

## 2017-02-04 ENCOUNTER — Telehealth: Payer: Self-pay | Admitting: Family Medicine

## 2017-02-04 ENCOUNTER — Telehealth: Payer: Self-pay | Admitting: Pulmonary Disease

## 2017-02-04 DIAGNOSIS — J849 Interstitial pulmonary disease, unspecified: Secondary | ICD-10-CM

## 2017-02-04 MED ORDER — AMITRIPTYLINE HCL 10 MG PO TABS: 10.0000 mg | ORAL_TABLET | Freq: Every day | ORAL | 3 refills | Status: DC

## 2017-02-04 NOTE — Telephone Encounter (Signed)
Medication called/sent to requested pharmacy  

## 2017-02-04 NOTE — Telephone Encounter (Signed)
Called spoke with patient who reported that she spoke with SN on Friday morning 4/20 and a CT was mentioned that patient stated is supposed to "be more extensive and there are only 2 people that can read it." There is no order or mention of this in Epic  Dr Kriste Basque please advise, thank you.

## 2017-02-04 NOTE — Telephone Encounter (Signed)
Per SN- order stat HRCT  [facility] aware  Order sent to [facility]

## 2017-02-04 NOTE — Telephone Encounter (Signed)
Patient normally get her amitriptyline through mail order but would like to get this at the local [facility] on [address] if possible  [phone]

## 2017-02-09 ENCOUNTER — Ambulatory Visit (INDEPENDENT_AMBULATORY_CARE_PROVIDER_SITE_OTHER)
Admission: RE | Admit: 2017-02-09 | Discharge: 2017-02-09 | Disposition: A | Discharge status: Home / Self Care | Payer: BC Managed Care – PPO | Source: Ambulatory Visit | Attending: Pulmonary Disease | Admitting: Pulmonary Disease

## 2017-02-09 DIAGNOSIS — J849 Interstitial pulmonary disease, unspecified: Secondary | ICD-10-CM

## 2017-02-11 ENCOUNTER — Telehealth: Payer: Self-pay | Admitting: Adult Health

## 2017-02-11 NOTE — Telephone Encounter (Signed)
Patient seen by TP 4.9.18 and download was requested Per TP: excellent control, no changes  Called spoke with patient, discussed CPAP download results/recs as stated by TP Pt voiced her understanding and denied any questions/concerns Nothing further needed; will sign off

## 2017-02-18 ENCOUNTER — Ambulatory Visit: Payer: Self-pay | Admitting: Pulmonary Disease

## 2017-02-19 ENCOUNTER — Ambulatory Visit: Payer: Self-pay | Admitting: Pulmonary Disease

## 2017-02-23 ENCOUNTER — Ambulatory Visit (INDEPENDENT_AMBULATORY_CARE_PROVIDER_SITE_OTHER): Payer: BC Managed Care – PPO | Admitting: Orthopaedic Surgery

## 2017-02-23 DIAGNOSIS — M25561 Pain in right knee: Secondary | ICD-10-CM | POA: Diagnosis not present

## 2017-02-23 DIAGNOSIS — M1711 Unilateral primary osteoarthritis, right knee: Secondary | ICD-10-CM

## 2017-02-23 DIAGNOSIS — G8929 Other chronic pain: Secondary | ICD-10-CM

## 2017-02-23 MED ORDER — LIDOCAINE HCL 1 % IJ SOLN
3.0000 mL | INTRAMUSCULAR | Status: AC | PRN
  Administered 2017-02-23: 3 mL

## 2017-02-23 MED ORDER — METHYLPREDNISOLONE ACETATE 40 MG/ML IJ SUSP
40.0000 mg | INTRAMUSCULAR | Status: AC | PRN
  Administered 2017-02-23: 40 mg via INTRA_ARTICULAR

## 2017-02-23 NOTE — Progress Notes (Signed)
Office Visit Note   Patient: Heather [name]           Date of Birth: [birth year]           MRN: [id number] Visit Date: 02/23/2017              Requested by: Heather Brooks, MD [address] [place], [place] [address] PCP: Heather Brooks, MD   Assessment & Plan: Visit Diagnoses:  1. Chronic pain of right knee   2. Unilateral primary osteoarthritis, right knee     Plan: The only thing I can recommend at this point is occasional steroid injection. Weight loss and quad strengthening exercises certainly help but she is too obese to consider knee replacement surgery.  Follow-Up Instructions: Return in about 3 months (around 05/26/2017).   Orders:  Orders Placed This Encounter  Procedures  . Large Joint Injection/Arthrocentesis   No orders of the defined types were placed in this encounter.     Procedures: Large Joint Inj Date/Time: 02/23/2017 1:42 PM Performed by: Heather [name] Authorized by: Heather [name]   Location:  Knee Site:  R knee Ultrasound Guidance: No   Fluoroscopic Guidance: No   Arthrogram: No   Medications:  3 mL lidocaine 1 %; 40 mg methylPREDNISolone acetate 40 MG/ML     Clinical Data: No additional findings.   Subjective: No chief complaint on file. Heather [name]  is here today 2 months after having a Synvisc 1 injection in her right knee. She is somewhat is morbidly obese and has known arthritis in that knee. We performed arthroscopic intervention have not been comfortable trying a knee replacement due to her obesity. She still having pain and discomfort of that knee. She is also on chronic blood thinners to 2 atrial fibrillation/atrial flutter. She is also on chronic oxygen. The knee has been painful to her.  HPI  Review of Systems She currently denies any chest pain or shortness of breath, she denies any fever, chills, nausea, vomiting  Objective: Vital Signs: There were no vitals taken for this visit.  Physical  Exam She is alert and oriented 3 in no acute distress. She is morbidly obese Ortho Exam Examination of her right knee she is a large soft tissue envelope around the knee. She is able to move the soft tissue around second see that there is no effusion. There is deathly medial lateral joint tenderness and arthritic changes. Specialty Comments:  No specialty comments available.  Imaging: No results found.   PMFS History: Patient Active Problem List   Diagnosis Date Noted  . Chronic pain of right knee 02/23/2017  . Unilateral primary osteoarthritis, right knee 02/23/2017  . ILD (interstitial lung disease) (HCC) 02/04/2017  . Chronic diastolic congestive heart failure (HCC) 01/29/2017  . Status post arthroscopy of right knee 11/25/2016  . Acute medial meniscal tear, right, initial encounter 11/06/2016  . Pulmonary hypertension (HCC) 03/22/2015  . Sleep apnea   . Atypical atrial flutter (HCC) 11/27/2013  . History of laparoscopic adjustable gastric banding, APS, 05/09/2008 12/22/2012  . Fatigue 11/23/2012  . Thyroid disease 09/24/2012  . Degenerative disc disease, lumbar 09/24/2012  . Morbid obesity (HCC) 07/30/2012  . Chronic anticoagulation 04/01/2012  . OSA (obstructive sleep apnea) 04/01/2012  . Atrial fibrillation (HCC) 03/24/2012  . Decreased libido 03/01/2012  . Carpal tunnel syndrome   . Essential hypertension 06/20/2010  . Shortness of breath 12/13/2009   Past Medical History:  Diagnosis Date  . Anemia    "  onc;e; had to take iron for awhile"  . Atrial fibrillation (HCC)    Began in 2004.  Had ablations at [facility] in 2005 and 2007.  She is off coumadin  now with no noted recurrent atrial fibrillation since 2007. til 04/01/12; s/p initiation of Rhythmol with DCCV June 2013  . Carpal tunnel syndrome   . Degenerative disk disease   . GERD (gastroesophageal reflux disease)    resolved after lap band  . History of blood transfusion    "w/both hip replacements"  . HTN  (hypertension)   . Hypothyroidism   . IBS (irritable bowel syndrome)   . Interstitial cystitis   . Morton's neuroma    right foot  . Obesity   . On home oxygen therapy   . Pulmonary hypertension (HCC)   . Shortness of breath    only without oxygen  . Sleep apnea 04/01/12   "dx'd just last week"  . Sleep apnea    uses CPAP, 11 CWP with residual AHI 6.3    Family History  Problem Relation Age of Onset  . Hypertension Father   . Diabetes Father   . Dementia Father   . Atrial fibrillation Mother   . Diabetes Sister   . Diabetes Brother   . Heart disease Maternal Grandmother   . Heart attack Unknown        granfather    Past Surgical History:  Procedure Laterality Date  . ANTERIOR AND POSTERIOR REPAIR  10/20/2012   Procedure: ANTERIOR (CYSTOCELE) AND POSTERIOR REPAIR (RECTOCELE);  Surgeon: Heather Arthur, MD;  Location: [facility] ORS;  Service: Gynecology;  Laterality: N/A;  2 hours  . ATRIAL FIBRILLATION ABLATION N/A 12/30/2012   Procedure: ATRIAL FIBRILLATION ABLATION;  Surgeon: Heather Range, MD;  Location: [facility] CATH LAB;  Service: Cardiovascular;  Laterality: N/A;  . BACK SURGERY    . CARDIAC CATHETERIZATION    . CARDIAC ELECTROPHYSIOLOGY MAPPING AND ABLATION  ~ 2005 and 2007   "did more 2nd time; both at [facility]"  . CARDIOVERSION  04/02/2012   Procedure: CARDIOVERSION;  Surgeon: Heather Morale, MD;  Location: [facility] OR;  Service: Cardiovascular;  Laterality: N/A;  . CARDIOVERSION  06/16/2012   Procedure: CARDIOVERSION;  Surgeon: Heather Mixer, MD;  Location: [facility] ENDOSCOPY;  Service: Cardiovascular;  Laterality: N/A;  Heather [name]/Heather [name]/Heather [name]( or scheduling)  . CARDIOVERSION  10/29/2012   Procedure: CARDIOVERSION;  Surgeon: Heather Morale, MD;  Location: [facility] ENDOSCOPY;  Service: Cardiovascular;  Laterality: N/A;  . CARDIOVERSION N/A 03/30/2013   Procedure: CARDIOVERSION;  Surgeon: Heather Morale, MD;  Location: [facility] ENDOSCOPY;  Service: Cardiovascular;  Laterality: N/A;  . CARDIOVERSION  N/A 12/23/2013   Procedure: CARDIOVERSION;  Surgeon: Heather Masson, MD;  Location[facility] ENDOSCOPY;  Service: Cardiovascular;  Laterality: N/A;  9:27 Propofol 70mg , IV   150 joules synched shock by Dr. Delton See @ 150 joules...SR   post 12 lead ordered.   . CARPAL TUNNEL RELEASE  1990's   bilaterally  . DILATION AND CURETTAGE OF UTERUS  2000  . JOINT REPLACEMENT     bilateral hips  . KNEE ARTHROSCOPY Right 11/18/2016   Procedure: RIGHT KNEE ARTHROSCOPY WITH DEBRIDEMENT;  Surgeon: Heather Hitch, MD;  Location: [facility] OR;  Service: Orthopedics;  Laterality: Right;  . LAPAROSCOPIC GASTRIC BANDING  2009  . LASIK    . POSTERIOR FUSION LUMBAR SPINE  2000's   "nerve problems"  . POSTERIOR FUSION LUMBAR SPINE  2000's  . TEE WITHOUT CARDIOVERSION N/A 12/29/2012  Procedure: TRANSESOPHAGEAL ECHOCARDIOGRAM (TEE);  Surgeon: Peter M SwazilandJordan, MD;  [facility];  Service: Cardiovascular;  Laterality: N/A;  . TONSILLECTOMY AND ADENOIDECTOMY  1957  . TOTAL HIP ARTHROPLASTY  ~2009; 2010   right; left  . TUBAL LIGATION  1980  . VAGINAL HYSTERECTOMY  2000   Social History   Occupational History  . Not on file.   Social History Main Topics  . Smoking status: Never Smoker  . Smokeless tobacco: Never Used  . Alcohol use No  . Drug use: No  . Sexual activity: Yes     Comment: LAVH

## 2017-03-02 ENCOUNTER — Ambulatory Visit (INDEPENDENT_AMBULATORY_CARE_PROVIDER_SITE_OTHER)
Admission: RE | Admit: 2017-03-02 | Discharge: 2017-03-02 | Disposition: A | Discharge status: Home / Self Care | Payer: BC Managed Care – PPO | Source: Ambulatory Visit | Attending: Pulmonary Disease | Admitting: Pulmonary Disease

## 2017-03-02 ENCOUNTER — Ambulatory Visit (INDEPENDENT_AMBULATORY_CARE_PROVIDER_SITE_OTHER): Payer: BC Managed Care – PPO | Admitting: Pulmonary Disease

## 2017-03-02 ENCOUNTER — Ambulatory Visit: Payer: Self-pay | Admitting: Pulmonary Disease

## 2017-03-02 ENCOUNTER — Other Ambulatory Visit (INDEPENDENT_AMBULATORY_CARE_PROVIDER_SITE_OTHER): Payer: BC Managed Care – PPO

## 2017-03-02 VITALS — BP 124/74 | HR 70 | Temp 97.4°F | Ht 65.0 in | Wt 316.4 lb

## 2017-03-02 DIAGNOSIS — J849 Interstitial pulmonary disease, unspecified: Secondary | ICD-10-CM

## 2017-03-02 DIAGNOSIS — I480 Paroxysmal atrial fibrillation: Secondary | ICD-10-CM

## 2017-03-02 DIAGNOSIS — E079 Disorder of thyroid, unspecified: Secondary | ICD-10-CM

## 2017-03-02 DIAGNOSIS — I272 Pulmonary hypertension, unspecified: Secondary | ICD-10-CM

## 2017-03-02 DIAGNOSIS — I10 Essential (primary) hypertension: Secondary | ICD-10-CM

## 2017-03-02 DIAGNOSIS — G4733 Obstructive sleep apnea (adult) (pediatric): Secondary | ICD-10-CM

## 2017-03-02 DIAGNOSIS — Z9889 Other specified postprocedural states: Secondary | ICD-10-CM

## 2017-03-02 DIAGNOSIS — I5032 Chronic diastolic (congestive) heart failure: Secondary | ICD-10-CM

## 2017-03-02 DIAGNOSIS — M1711 Unilateral primary osteoarthritis, right knee: Secondary | ICD-10-CM

## 2017-03-02 DIAGNOSIS — Z7901 Long term (current) use of anticoagulants: Secondary | ICD-10-CM

## 2017-03-02 LAB — SEDIMENTATION RATE: Sed Rate: 28 mm/hr (ref 0–30)

## 2017-03-02 LAB — ANGIOTENSIN CONVERTING ENZYME: ANGIOTENSIN-CONVERTING ENZYME: 25 U/L (ref 9–67)

## 2017-03-02 MED ORDER — CLONAZEPAM 0.5 MG PO TABS: ORAL_TABLET | ORAL | 5 refills | Status: DC

## 2017-03-02 NOTE — Patient Instructions (Signed)
Today we updated your med list in our EPIC system...    Continue your current medications the same...  We anticipate decreasing the Prednisone dose once we get the CXR & labs back...  Today we did a follow up CXR & blood work as discussed...    We will contact you w/ the results when available...   We will also pursue a letter to your insurance company regarding the Bariatric surg...  Call for any questions...  Let's plan a follow up visit in 4-6 WEEKS, sooner if needed for problems.Marland Kitchen..Marland Kitchen

## 2017-03-02 NOTE — Progress Notes (Signed)
Subjective:     Patient ID: WONDER DONAWAY, female   DOB: October 25, 1950, 66 y.o.   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   ~  March 22, 2015:  Initial pulmonary consult by SN>        57 y/o WF, referred by DrFitzgerald for a pulmonary evaluation due to dyspnea and pulmonary hypertension per 2DEcho>  [name] notes SOB/ DOE for over a year now, she has DOE w/ ADLs gradually worse over that time; she denies cough, sputum, hemoptysis; she denies CP, palpit, dizzy/syncope; she has VI w/ periph edema... She was sent here for a pulm eval due to SOB/DOE w/ any activ and a 2DEcho done 02/14/15 in [place] showing norm LV size & function w/ EF=60-65% & norm wall motion, mild MR, mod LA&RA dil, norm RVF w/ severe pulmHTN (RVSP=80); prev 2DEcho 04/13/13 showed norm LVF w/ EF=55-60%, norm wall motion, no signif valve dis, mild-mod LAdil at 40m and PAsys=344mg; as far as causes for secondary pulmonaryHTN-- she has severe morbid obesity ([facility] OSA on CPAP per her PCP, mild-mod restrictive lung dis likely related to her massive obesity, and she needs additional work up to rule out other contributing factors...       Her [facility]Pickard [facility]Durham [facility] she is followed for Morbid Obesity & OSA on CPAP- pt reports a 3 yr hx OSA, diagnosed by DrPickard on a sleep study and treated by him w/ CPAP; the only avail [place] severe [place]SA w/ AHI=25/hr (52/hr in REM), and RDI=29/hr (54/hr in REM), assoc w/ oxygen desat to 76%, mod snoring, no limb movements- events noted to be worse supine & during REM;  She was supposed to have a CPAP tiration study but I can't find it in Epic, neither  can I find download data or a subseq ONO... [facility]>   <01/11/15> c/o SOB (tight & heavy in her chest), cough, wheezing, felt to have an asthma exac- given Pred, AlbutHFA, nasonex...  <01/29/15> c/o persistent wheezing & SOB, plus water retention from the Pred, & she was given Pulmicort-2spBid...   <02/28/15> f/u visit & recent CXR said to show incr edema so they increased Lasix & referred to Cards- DrFitzgerald...      [place] Dr[place]red in [place]> she has hx HBP, AFib first in 2004 ([facility] back in sinus rhythm from 2007-2013 & then 6 different cardioversions 2013-2015); last note from DrAllred was 02/03/14- she had cardioversion 12/2013 w/ improved energy but then went back into her atypical AFlutter; at that visit she denied CP, palpit, SOB, and he reported no edema; since she failed several antiarrhythmic meds & DCCV, she was rec to consider another ablation w/ DrFitzgerald... Last note from DrFitzgerald in Epic was 02/01/15- f/u from another cardioversion, on Amio & dose decr from 400=>300 due to elev level, pt holding NSR...        EXAM showed Afeb, VSS, O2sat=97% on RA at rest; Wt=356#, 5'5"Tall, BMI=59;  HEENT- neg, mallampati2;  Chest- decr BS at bases, w/o w/r/r;  Heart- RR gr1/6 SEM w/o r/g;  Abd- obese, neg;  Ext- VI w/ 4+edema...  CT Angio chest 03/06/09 (done for SOB & AFib) showed no PE, mild cardiomeg, otherw neg as well...  Sleep Study in [place] was 03/25/12 at the [facility] [place], [place]- it showed severe OSA w/ AHI=25/hr (52/hr in REM), and RDI=29/hr (54/hr in REM), assoc w/ oxygen desat to 76%, mod snoring, no limb movements- events noted to be worse supine & during REM...  Lexiscan Myoview 04/08/12 showed norm EF=60%, norm wall motion, soft tissue attenuation & no ischemia...  TEE 12/29/12 showed decr LVF w/ EF=40-45%, diffuse HK, no vegetations, no LA clot, no PFO or R=>L shunt, norm  RV...  2DEcho 04/13/13 showed norm LVF w/ EF=55-60%, norm wall motion, no signif valve dis, mild-mod LAdil at 46m and PAsys=370mg...  PFT 12/16/13 showed FVC=2.16 (64%), FEV1=1.70 (66%), %1sec=79, mid-flows are reduced at 63% predicted; Lung Vols are mildly reduced (TLC at 73%); DLCO mildly reduced at 74% predicted (VA is similarly reduced)...   CPAP Titration Study 06/12/14>  CPAP titrated to 11cmH2O w/ AHI=6.3/hr (titration lim by pt's inability to maintain sleep); she used nasal pillows, mean O2sat=93.4%, NSR w/ PACs, no signif movements,   CXR 01/30/15 showed cardiomegaly & prominent central pulm vascularity, mild incr in interstitial markings, no effusion, NAD...  [facility] [place] showed norm LV size & function w/ EF=60-65% & norm wall motion, mild MR, mod LA&RA dil, norm RVF w/ severe pulmHTN (RVSP=80)...  Ambulatory oxygen saturation test 03/22/15> O2sat=97% on RA at rest w/ pulse=54/min; she walked only 1 lap in the office & stopped due to fatigue & SOB- lowest O2sat=86% w/ pulse=83/min...  LABS 5-03/2015:  Chems- ok x BS=128, Cr=1.21, A1c=6.1;  CBC- Hg=11.2, MCV=86;  BNP=146;  TSH=9.71 (FreeT3=2.4, FreeT4=1.02)... Note Amio level 01/26/15=3.5 (1.0-2.5).  IMP/PLAN>>  CyDonnitaas severe SOB/DOE w/ ADLs and recent 2DEcho in [place] showing severe incr PAsys=802m;  The likely etiology of secondary pulmHTN is her severe morbid obesity (BMI=59), severe OSA & hypoxemia (see above);  [facility] & Epic shows f/u visits for 75yr55yr none since 2010 & she clearly didn't loose any weight ([facility] and to consider further options- ?gastric sleeve?);  She indicates that DrPickard started CPAP (she doesn't know the settings) & I cannot find orders or download data in Epic (we will call [facility] data);  Ambulatory O2 sat here today dropped to 86% on RA after one lap- she needs O2 w/ exercise and needs an ONO as well once we see the download data  regarding compliance; consider ABG to r/o OHS... Improving her PA pressure is likely going to depend on appropriate CPAP management, relieving hypoxemia, and losing weight...   ADDENDUM>>  Started on Home oxygen at 2L/min [place] => 24/7 due to her severe pulmHTN ADDENDUM>> We received download data from [facility] w/ humidifier)- good compliance w/ CPAP: 90/90 days (Apr-Jun2016), 7-8hrs per night, CPAP=11, AHI on CPAP=1.5... AMarland KitchenMarland KitchenENDUM>>  ONO on CPAP, on O2 at 2L/min => pending (it was done 04/05/15 ON ROOM AIR w/ desat to 74%, <88% for 3hrs) ADDENDUM>>  04/12/15- [facility]- note reviewed, wt=363#, BMI=59.5, they added 0.5cc to lap-band (total 5.0cc), reviewed diet/ nutrition consult etc & goal <300# or consider additional surg. ADDENDUM>>  04/17/15- ONO on O2 at 2L/min & CPAP showed she was still desaturating to <88% for 49.5min23mDesat events <88%=41 (Desat index of 11/hr); WE WILL  INCR NOCTURNAL O2 to 3L/min  ~  June 11, 2016:  46moROV & pulmonary [facility]>  After initial pulmonary consult 03/22/15 pt started on O2 and had the indicated CPAP download and ONO data but never returned to our office for recheck;  She was last increased to 3L/min added to her CPAP for the nocturnal hypoxemia and is now in need of another ONO to prove that this is adeq to keep her O2sats >90%, and she needs a 2DEcho to reassess her PAsys pressures... DrNewman is planning further Bariatiric surg in the near future & needs pulmonary surgical clearance...     She saw DrFitzgerald, Cards 06/03/16 & note reviewed in Care Everywhere>  HBP, PAF on Amio & s/p mult ablation procedures, OSA, PulmHTN- on Eliquis5Bid, Amio200, Metop25Bid, CardizemCD120, Losartan100, Lasix20-2Qam; she is holding NSR and cleared for bariatric surg by Cards;  NOTE-- Care Everywhere records indicate that she had a f/u 2DEcho [id number] the results are NOT avail & not referenced in any of  DrFitzgerald's cardiac notes (we need f/u 2DEcho to re-assess her est PAsys pressure while on adeq O2 supplementation...     From the pulmonary standpoint CJarellyfeels improved- less SOB but still w/ DOE after walking "long distances", she is again singing in the choir but needs her oxygen; she notes sl cough, denies sput production/ hemoptysis/ CP etc; We reviewed the following medical problems during today's office visit >>     Severe DYSPNEA>  She reports sl improved w/ wt loss so far & home oxygen therapy...    Nocturnal & exercise hypoxemia>  She desats to 86% on RA w/ min walking & signif O2 desats Qhs despite CPAP=> O2 incr to 3L/min bled into machine.    Pulmonary HTN>  2DEcho in [place] by DrFitagerald 5/16 indicated PAsys~80 (in 2014 it was ~34)...    OSA>  Initial sleep study by DrPickard & CPAP prescribed & followed by him; CPAP download from spring2016 showed AHI=1.5 on CPAP 11...    Morbid Obesity> Wt=356 lbs in Jun2016 & she is down to 311 lbs today; DFranki Monteis planning to convert her Lap band to Sleeve gastrectomy soon...    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    HBP>  On Metop25Bid, CardizenCD120, Losar100, Lasix40 & BP=128/74...    PAF>  On Eliquis5Bid & Amio200; she has had numerous ablation procedures; now holding NSR on the amio...    Ven Insuffic/ Edema>  On Lasix40 & the low salt diet...    Hypothyroid>  On Synthroid100 w/ TSH followed by DrPickard... EXAM shows pear-shaped body habitus, Afeb, VSS, O2sat=99% on RA at rest; Wt=311#, 5'5"Tall, BMI=52;  HEENT- neg, mallampati2;  Chest- decr BS at bases, w/o w/r/r;  Heart- RR gr1/6 SEM w/o r/g;  Abd- obese, neg;  Ext- VI w/ 1+edema...  Ambulatory Oximetry on 2L/min [place]>  O2sat=100% on 2L/min at rest w/ pulse=58/min;  She was able to ambulate just 1Lap (185') on the 2L O2 w/ lowest sat=100% w/ pulse=88/min... IMP/PLAN>>  We need to obtain an ONO on her 3L/min flow to prove  that she is no longer desaturating at night on her CPAP; it would also be instructive for a repeat 2DEcho to recheck her PAsys est pressure; she is OK for the surgery which we hope will be life saving given these life threatening complications from her morbid obesity...   ADDENDUM>>  06/30/16- ONO on O2 at  3L/min & her CPAP showed NO DESATURATIONS- all O2sat>90% on 3L/min SHE STILL NEEDS A FOLLOW UP 2DEcho TO RECHECK HER ESTIMATED PAsys PRESSURE and assess benefit from the O2  ~  January 29, 2017:  72moROV & pulmonary follow up visit>  CEmberleyis here w/ her husb today- she tells me that her insurance would not approve the Bariatic Surg (gastric sleeve) since she had already had the LapBand & didn't follow up w/ DrNewman on that one;  [facility] but it was again refused & I registered my sincere disappointment as I viewed the Sleeve surg as life-saving for her w/ her morbid obesity, OSA and severe pulm hypertension;  She never followed up w/ uKoreaeither but has remained faithful to her CPAP and OXYGEN therapy;  Recently she noted an increase in DYSPNEA and had a follow up visit w/ her Cardiologist- [place]Fitzgerald in [place](01/26/17 note reviewed)> HBP, PAF/ Flutter on Amio w/ mult prior ablation procedures, Morbid Obesity, OSA, & PulmHTN>  CXR showed incr interstitial markings suggesting CHF & her BNP was sl elev at 250;  Her Lasix was increased & she was sched for a f/u 2DEcho, the extra Lasix seemed to help the dyspnea;  DrFitsgerald stopped the Amio & placed her on Multaq, she remains on Eliquis as well, he wanted uKoreato recheck pt to see if she would benefit from Pred...     She notes 171mox incr SOB/DOE, notes she hasn't been able to walk as far or as long, then suddenly much worse w/ incr SOB w/ less activity; mild cough, occas mild sput production (no color, no blood), she denies CP, palpit, but notes sl incr edema as noted... She hasn't seen drPickard in ~1y86yrCXR 01/19/17 showed cardiomegaly,  bilat diffuse interstitial thickening (new from 01/2015)- no focal consolidation, effusion, etc;  Note> she went to see Ortho 10/2016 (DrDean & BlaNinfa Linden/ right knee pain & Sed elev at 88 & CRP was a sky-high 286; she prev received steroid shots 7 arthroscopic surg- was given Synvisc injection this time;  We discussed need for further eval & ILD work-up>>     EXAM shows pear-shaped body habitus, Afeb, VSS, O2sat=91% on RA at rest; Wt=303#, 5'5"Tall, BMI=51;  HEENT- neg, mallampati2;  Chest- decr BS at bases, w/o w/r/r;  Heart- RR gr1/6 SEM w/o r/g;  Abd- obese, neg;  Ext- VI w/ 1+edema...  LABS 10/31/16 by DrSDean, Ortho>  Sed=88, CRP=286  CPAP Download 3/14 - 01/22/17>  Excellent compliance- used 30/30 days for 8+hrs per night, CPAP set 11, min air leak, AHI=1.3 per hr... Continue same.  CXR 01/19/17>  Cardiomegaly, bilat diffuse interstitial thickening (new from 01/2015)- no focal consolidation, effusion, etc...  LABS 01/2017>  Chems- wnl w/ BS=105, Cr=1.03, BNP=250=>158 (after incr Lasix dose);  CBC- ok w/ borderline Hg=12.5, mcv=87;  TSH=5.37 on Levothy50 (rec to inc to 27m32m);    Collagen-Vasc screen 01/2017>  Quantiferon Gold= INDETERM;  Sed=122;  RA factor= NEG (<14);  Anti-CCP= NEG (<16);  ANA=neg;  ANCA=NEG;  ACE=76 (9-67);  Sjogrens SSA (Ro) & SSB (La)= NEG;  Scleroderma Scl-70= NEG;  CPK= 47 (7-177);  RNP= +6.5 (nl<1.0) => Pending:  dsDNA antibodies (=neg) and Sm Antibodies (=neg). Therefore no serologic evid for Lupus, Scleroderma, Sjogrens, or MCTD...  2DEcho 02/03/17> norm LV w/ EF=60-65% & no regional wall motion abn, +Gr2DD, Ao root is wnl, AoV ok w/ mild AI, MV ok w/ trivMR, LA normal, RV normal, RA mod  dil & PAsys~44mHg  Hi-res CT Chest 02/09/17> cardiomeg, Ao atherosclerosis, ectatic ascending Ao ~4cm & no ch from 2010, dilated main PA at 3.8cm (prev 3.4);  Few mildly enlarged mediastinal nodes (largest1.5cm right paratrach area);  Numerous poorly marginated subsolid nodules bilaterally  (most prom in LL's- eg 1.2cm RLL & 1.0cm LLL); note- no subpleural reticulation, traction bronchiectasis, parenchymal banding, architectural distortion or honeycombing); mild interlobar septal thickening (suggesting mild pulm edema) +thoracic spndylosis... Overall pattern suggesting infections vs atypic inflamm etiologies like COP (cryptogenic organizing pneumonitis)...  IMP/PLAN>>  CDelberthas a complex medical picture w/ underlying Morbid Obesity, Restrictive pulm dis, chronic hypoxemic resp failure on O2, & OSA on CPAP=> causing secondary pulm HTN (Prob WHO Group 3);  On effective CPAP therapy & 24/7 O2 w/ resolution of her hypoxemia- her PA pressure has improved from ~80 (02/2015) to ~65 at present, unfortunately she was turned down by her insurance company for further Bariatric surg as I still believe that a successful gastric sleeve procedure could prove life saving!         Now she presents w/ a superimposed acute dyspnea w/ CXR abn thought to suggest early CHF & a BNP=250 + 2DEcho w/ good LVF but Gr2DD, placed on Lasix w/ only min improvement;  Note recent Ortho eval w/ very high inflamm markers- treated w/ synvisc;  Further Pulm eval reveals Hi-res CT Chest suggesting COP & collagen vasc screen w/ a few unexplained random marker abn (eg- neg ANA & ANCA but pos RNP, plus the ACE level is sl elev & inflamm markers remain very high (sed=122);  She was placed on empiric trial of PRED pending some of our studies and she's had a good clinical response w/ improvement in her breathing (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  We will have her ret in 136moo assess response & recheck labs.      Note that CARDS- DrFitzgerald has stopped her Amio in favor of Multaq for her PAF & she continues to hold NSR... Note: >50% of this 6064mappt was spent in counseling & coordination of care...   ~  Mar 02, 2017:  63mo78mo & pulm follow up visit> last visit CyntAnett started on Pred due to serology showing elev Sed &  ACE plus hi-res CT c/w COP;  She reports much improved on the Pred 20mg23m x 2wks then 20mg/103ml now;  Despite counseling re appetite & oral intake- she has gained 14# to 316# today!  She is unable to exercise due to her right knee- she has seen DrCBlackman, Ortho & he did arthrocentesis & injection, tried Synvisc, tried Xylocaine & Depomedrol...     EXAM shows pear-shaped body habitus, Afeb, VSS, O2sat=97% on O2 at 2L/min pulse; Wt=316#, 5'5"Tall, BMI=52;  HEENT- neg, mallampati2;  Chest- decr BS at bases, w/o w/r/r;  Heart- RR gr1/6 SEM w/o r/g;  Abd- obese, neg;  Ext- VI w/ 1+edema...  CXR 03/02/17 (independently reviewed by me in the PACS system) showed cardiomegaly, clear lungs/ NAD- no vasc congestion, post surg changes in LUQ, mild DDD in Tspine...  LABS 03/02/17>  Sed improved down to 28, and ACE improved down to 25 on the Pred; RNP antibody is still pos at 6.9 (no change) IMP/PLAN>>  CynthiMayliproved- breathing better on the Pred & inflamm markers are much better;  This favors the Dx of COP & we will continue to wean the Pred from 20mg/d60m to 20-10 qod for 3wks and then 10mg/d93m  til return in 6wks;  She is admonished to get on a vigorous low carb, low fat diet & get her weight down, again I am in favor of Bariatric surg as a life-saving procedure for her- she still has severe secondary pulmHTN on treatment OSA, morbid obesity, restrictive pulm dis...      Past Medical History:  Diagnosis Date  . Anemia    "onc;e; had to take iron for awhile"  . Atrial fibrillation ([place])    Began in 2004.  Had ablations at [facility] in 2005 and 2007.  She is off coumadin  now with no noted recurrent atrial fibrillation since 2007. til 04/01/12; s/p initiation of Rhythmol with DCCV June 2013  . Carpal tunnel syndrome   . Degenerative disk disease   . GERD (gastroesophageal reflux disease)    resolved after lap band  . History of blood transfusion    "w/both hip replacements"  . HTN (hypertension)    . Hypothyroidism   . IBS (irritable bowel syndrome)   . Interstitial cystitis   . Morton's neuroma    right foot  . Obesity   . On home oxygen therapy   . Pulmonary hypertension ([place])   . Shortness of breath    only without oxygen  . Sleep apnea 04/01/12   "dx'd just last week"  . Sleep apnea    uses CPAP, 11 CWP with residual AHI 6.3    Past Surgical History:  Procedure Laterality Date  . ANTERIOR AND POSTERIOR REPAIR  10/20/2012   Procedure: ANTERIOR (CYSTOCELE) AND POSTERIOR REPAIR (RECTOCELE);  Surgeon: Alwyn Pea, MD;  Location: [facility];  Service: Gynecology;  Laterality: N/A;  2 hours  . ATRIAL FIBRILLATION ABLATION N/A 12/30/2012   Procedure: ATRIAL FIBRILLATION ABLATION;  Surgeon: Thompson Grayer, MD;  Location[facility] CATH LAB;  Service: Cardiovascular;  Laterality: N/A;  . BACK SURGERY    . CARDIAC CATHETERIZATION    . CARDIAC ELECTROPHYSIOLOGY MAPPING AND ABLATION  ~ 2005 and 2007   "did more 2nd time; both at [facility]"  . CARDIOVERSION  04/02/2012   Procedure: CARDIOVERSION;  Surgeon: Larey Dresser, MD;  Location: [facility];  Service: Cardiovascular;  Laterality: N/A;  . CARDIOVERSION  06/16/2012   Procedure: CARDIOVERSION;  Surgeon: Thayer Headings, MD;  Location: [facility];  Service: Cardiovascular;  Laterality: N/A;  amanda/ebp/[place]( or scheduling)  . CARDIOVERSION  10/29/2012   Procedure: CARDIOVERSION;  Surgeon: Larey Dresser, MD;  Location: [facility] ENDOSCOPY;  Service: Cardiovascular;  Laterality: N/A;  . CARDIOVERSION N/A 03/30/2013   Procedure: CARDIOVERSION;  Surgeon: Larey Dresser, MD;  Location: [facility];  Service: Cardiovascular;  Laterality: N/A;  . CARDIOVERSION N/A 12/23/2013   Procedure: CARDIOVERSION;  Surgeon: Dorothy Spark, MD;  Location: [facility];  Service: Cardiovascular;  Laterality: N/A;  9:27 Propofol 4m, IV   150 joules synched shock by Dr. NMeda Coffee@ 150 joules...SR   post 12 lead ordered.   . CARPAL TUNNEL RELEASE  1990's    bilaterally  . DILATION AND CURETTAGE OF UTERUS  2000  . JOINT REPLACEMENT     bilateral hips  . KNEE ARTHROSCOPY Right 11/18/2016   Procedure: RIGHT KNEE ARTHROSCOPY WITH DEBRIDEMENT;  Surgeon: CMcarthur Rossetti MD;  [place]  Service: Orthopedics;  Laterality: Right;  . LAPAROSCOPIC GASTRIC BANDING  2009  . LASIK    . POSTERIOR FUSION LUMBAR SPINE  2000's   "nerve problems"  . POSTERIOR FUSION LUMBAR SPINE  2000's  . TEE  WITHOUT CARDIOVERSION N/A 12/29/2012   Procedure: TRANSESOPHAGEAL ECHOCARDIOGRAM (TEE);  Surgeon: Peter M Martinique, MD;  Location: [place];  Service: Cardiovascular;  Laterality: N/A;  . [place]  . TOTAL HIP ARTHROPLASTY  ~2009; 2010   right; left  . TUBAL LIGATION  1980  . VAGINAL HYSTERECTOMY  2000    Outpatient Encounter Prescriptions as of 03/02/2017  Medication Sig  . acetaminophen-codeine (TYLENOL #3) 300-30 MG tablet Take 1 tablet by mouth every 6 (six) hours as needed. for pain  . amitriptyline (ELAVIL) 10 MG tablet Take 1 tablet (10 mg total) by mouth at bedtime.  . cetirizine (ZYRTEC) 10 MG tablet Take 10 mg by mouth daily.  . Cholecalciferol (VITAMIN D) 2000 UNITS tablet Take 2,000 Units by mouth daily.  . diclofenac sodium (VOLTAREN) 1 % GEL Apply 2 g topically 4 (four) times daily.  Marland Kitchen diltiazem (CARDIZEM CD) 120 MG 24 hr capsule Take 169m by mouth daily  . dronedarone (MULTAQ) 400 MG tablet Take 400 mg by mouth 2 (two) times daily with a meal.  . ELIQUIS 5 MG TABS tablet TAKE 1 TABLET BY MOUTH TWICE A DAY....NEED OFFICE VISIT BEFORE ANY MORE REFILLS  . furosemide (LASIX) 40 MG tablet Take 40 mg by mouth daily.  .Marland KitchenguaiFENesin (MUCINEX) 600 MG 12 hr tablet Take 600 mg by mouth 2 (two) times daily as needed. For allergies.  .Marland Kitchenlevothyroxine (SYNTHROID, LEVOTHROID) 75 MCG tablet Take 1 tablet (75 mcg total) by mouth daily before breakfast.  . losartan (COZAAR) 100 MG tablet TAKE 1 TABLET BY MOUTH  DAILY  .  Magnesium 250 MG TABS Take 250 mg by mouth 2 (two) times daily.  . metoprolol tartrate (LOPRESSOR) 25 MG tablet Take 25 mg by mouth 2 (two) times daily.   . predniSONE (DELTASONE) 20 MG tablet Take as directed  . clonazePAM (KLONOPIN) 0.5 MG tablet Take 1/2 to 1 tablet by mouth two times daily  . [DISCONTINUED] furosemide (LASIX) 20 MG tablet Take 40 mg by mouth daily.   No facility-administered encounter medications on file as of 03/02/2017.     Allergies  Allergen Reactions  . Cephalexin Hives and Other (See Comments)    "throat started closing up"  . Rocephin [Ceftriaxone Sodium In Dextrose] Anaphylaxis  . Penicillins Rash    Has patient had a PCN reaction causing immediate rash, facial/tongue/throat swelling, SOB or lightheadedness with hypotension: no Has patient had a PCN reaction causing severe rash involving mucus membranes or skin necrosis: no Has patient had a PCN reaction that required hospitalization no Has patient had a PCN reaction occurring within the last 10 years: no If all of the above answers are "NO", then may proceed with Cephalosporin use.  . Flecainide Acetate Other (See Comments)    "couldn't take it"  . Moxifloxacin     Does not remember the reaction  . Sulfonamide Derivatives     Does not remember reaction ; "was so young when I had reaction to it"    Immunization History  Administered Date(s) Administered  . Influenza Split 07/13/2016  . Influenza-Unspecified 11/04/2012, 08/24/2014  . Zoster 03/24/2013    Current Medications, Allergies, Past Medical History, Past Surgical History, Family History, and Social History were reviewed in CReliant Energyrecord.   Review of Systems            All symptoms NEG except where BOLDED >>  Constitutional:  F/C/S, fatigue, anorexia, unexpected weight change. HEENT:  HA, visual changes, hearing  loss, earache, nasal symptoms, sore throat, mouth sores, hoarseness. Resp:  cough, sputum, hemoptysis;  SOB, tightness, wheezing. Cardio:  CP, palpit, DOE, orthopnea, edema. GI:  N/V/D/C, blood in stool; reflux, abd pain, distention, gas. GU:  dysuria, freq, urgency, hematuria, flank pain, voiding difficulty. MS:  joint pain, swelling, tenderness, decr ROM; neck pain, back pain, etc. Neuro:  HA, tremors, seizures, dizziness, syncope, weakness, numbness, gait abn. Skin:  suspicious lesions or skin rash. Heme:  adenopathy, bruising, bleeding. Psyche:  confusion, agitation, sleep disturbance, hallucinations, anxiety, depression suicidal.   Objective:   Physical Exam      Vital Signs:  Reviewed...   General:  WD, morbidly obese, 66 y/o WF in NAD; alert & oriented; pleasant & cooperative... HEENT:  [place]/AT; Conjunctiva- pink, Sclera- nonicteric, EOM-wnl, PERRLA, EACs-clear, TMs-wnl; NOSE-clear; THROAT-clear & wnl.  Neck:  Supple w/ decr ROM; no JVD; normal carotid impulses w/o bruits; no thyromegaly or nodules palpated; no lymphadenopathy.  Chest:  decr BS bilat at bases, but clear without wheezes, rales, or rhonchi heard. Heart:  Regular Rhythm; gr1/6SEM without rubs or gallops detected. Abdomen:  Obese, soft & nontender- no guarding or rebound; normal bowel sounds; no organomegaly or masses palpated. Ext:  decrROM; +arthritic changes; no varicose veins, +venous insuffic, 2+ edema;  Pulses intact w/o bruits. Neuro:  No focal neuro deficits, +gait abnormality & balance fair... Derm:  No lesions noted; no rash etc. Lymph:  No cervical, supraclavicular, axillary, or inguinal adenopathy palpated.   Assessment:      IMP >>     Severe DYSPNEA>  She reports sl improved w/ wt loss so far & home oxygen therapy...    Nocturnal & exercise hypoxemia>  She desats to 86% on RA w/ min walking & signif O2 desats Qhs despite CPAP=> O2 incr to 3L/min bled into machine.    Pulmonary HTN>  2DEcho in [place] by DrFitagerald 5/16 indicated PAsys~80 (in 2014 it was ~34)...    OSA>  Initial sleep study by DrPickard &  CPAP prescribed & followed by him; CPAP download from spring2016 showed AHI=1.5 on CPAP 11...    Morbid Obesity> Wt=356 lbs in Jun2016 & she is down to 311 lbs today; Franki Monte is planning to convert her Lap band to Sleeve gastrectomy soon...    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    HBP>  On Metop25Bid, CardizenCD120, Losar100, Lasix40 & BP=128/74...    PAF>  On Eliquis5Bid & Amio200; she has had numerous ablation procedures; now holding NSR on the amio...    Ven Insuffic/ Edema>  On Lasix40 & the low salt diet...    Hypothyroid>  On Synthroid100 w/ TSH followed by DrPickard...  PLAN >>  03/22/15>   Jnaya has severe SOB/DOE w/ ADLs and recent 2DEcho in [place] showing severe incr PAsys=1mHg;  The likely etiology of secondary pulmHTN is her severe morbid obesity (BMI=59), severe OSA & hypoxemia (see above);  [facility] & Epic shows f/u visits for [place] none since 2010 & she clearly didn't loose any weight ([facility] and to consider further options- ?gastric sleeve?);  She indicates that DrPickard started CPAP (she doesn't know the settings) & I cannot find orders or download data in Epic ([facility] [place] [facility] [place]);  Ambulatory O2 sat here today dropped to 86% on RA after one lap- she needs O2 w/ exercise and needs an ONO as well once we see  the download data regarding compliance; consider ABG to r/o OHS... Improving her PA pressure is likely going to depend on appropriate CPAP management, relieving hypoxemia, and losing weight. 06/11/16>   We need to obtain an ONO on her 3L/min flow to prove that she is no longer desaturating at night on her CPAP; it would also be instructive for a repeat 2DEcho to recheck her PAsys est pressure; she is OK for the surgery which we hope will be life saving given these life threatening complications from her morbid obesity.  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       Now she presents w/ a superimposed acute dyspnea w/ CXR abn thought to suggest early CHF & a BNP=250 + 2DEcho w/ good LVF but Gr2DD, placed on Lasix w/ only min improvement;  Note recent Ortho eval w/ very high inflamm markers- treated w/ synvisc;  Further Pulm eval reveals Hi-res CT Chest suggesting COP & collagen vasc screen w/ a few unexplained random marker abn (eg- neg ANA & ANCA but pos RNP, plus the ACE level is sl elev & inflamm markers remain very high (sed=122);  She was placed on empiric trial of PRED pending some of our studies and she's had a good clinical response w/ improvement in her breathing (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  We will have her ret in 23moto assess response & recheck labs. 03/02/17>   CGeneiveis improved- breathing better on the Pred & inflamm markers are much better;  This favors the Dx of COP & we will continue to wean the Pred from 25md now to 20-10 qod for 3wks and then 1059m til return in 6wks;  She is admonished to get on a vigorous low carb, low fat diet & get her weight down, again I am in favor of Bariatric surg as a life-saving procedure for her- she still has severe secondary pulmHTN on treatment OSA, morbid obesity, restrictive pulm dis.     Plan:     Patient's Medications  New Prescriptions           PREDNISONE 24m54mbs            Take as directed in a tapering sched   CLONAZEPAM (KLONOPIN) 0.5 MG TABLET    Take 1/2 to 1 tablet by mouth two times daily  Previous Medications    ACETAMINOPHEN-CODEINE (TYLENOL #3) 300-30 MG TABLET    Take 1 tablet by mouth every 6 (six) hours as needed. for pain   AMITRIPTYLINE (ELAVIL) 10 MG TABLET    Take 1 tablet (10 mg total) by mouth at bedtime.   CETIRIZINE (ZYRTEC) 10 MG TABLET    Take 10 mg by mouth daily.   CHOLECALCIFEROL (VITAMIN D) 2000 UNITS TABLET    Take 2,000 Units by mouth daily.   DICLOFENAC SODIUM (VOLTAREN) 1 % GEL    Apply 2 g topically 4 (four) times daily.   DILTIAZEM (CARDIZEM CD) 120 MG 24 HR CAPSULE    Take 124mg34mmouth daily  DRONEDARONE (MULTAQ) 400 MG TABLET    Take 400 mg by mouth 2 (two) times daily with a meal.   ELIQUIS 5 MG TABS TABLET    TAKE 1 TABLET BY MOUTH TWICE A DAY....NEED OFFICE VISIT BEFORE ANY MORE REFILLS   FUROSEMIDE (LASIX) 40 MG TABLET    Take 40 mg by mouth daily.   GUAIFENESIN (MUCINEX) 600 MG 12 HR TABLET    Take 600 mg by mouth 2 (two) times daily as needed. For allergies.   LEVOTHYROXINE (SYNTHROID, LEVOTHROID) 75 MCG TABLET    Take 1 tablet (75 mcg total) by mouth daily before breakfast.   LOSARTAN (COZAAR) 100 MG TABLET    TAKE 1 TABLET BY MOUTH  DAILY   MAGNESIUM 250 MG TABS    Take 250 mg by mouth 2 (two) times daily.   METOPROLOL TARTRATE (LOPRESSOR) 25 MG TABLET    Take 25 mg by mouth 2 (two) times daily.    PREDNISONE (DELTASONE) 20 MG TABLET    Take as directed  Modified Medications   No medications on file  Discontinued Medications   FUROSEMIDE (LASIX) 20 MG TABLET    Take 40 mg by mouth daily.

## 2017-03-03 LAB — SPECIMEN STATUS

## 2017-03-03 LAB — ANA+ENA+DNA/DS+ANTICH+CENTR
ANA Titer 1: NEGATIVE
Centromere Ab Screen: <0.2 AI (ref 0.0–0.9)
ENA RNP Ab: 7.2 AI — ABNORMAL HIGH (ref 0.0–0.9)
ENA SM Ab Ser-aCnc: <0.2 AI (ref 0.0–0.9)
ENA SSA (RO) Ab: <0.2 AI (ref 0.0–0.9)
ENA SSB (LA) Ab: <0.2 AI (ref 0.0–0.9)
Scleroderma SCL-70: <0.2 AI (ref 0.0–0.9)
dsDNA Ab: <1 IU/mL (ref 0–9)

## 2017-03-03 LAB — RNP ANTIBODY: RIBONUCLEIC PROTEIN(ENA) ANTIBODY, IGG: POSITIVE — AB

## 2017-03-03 LAB — ANTI-SMITH ANTIBODY: ENA SM AB SER-ACNC: NEGATIVE

## 2017-03-08 ENCOUNTER — Other Ambulatory Visit: Payer: Self-pay | Admitting: Pulmonary Disease

## 2017-03-13 NOTE — Addendum Note (Signed)
Addendum  created 03/13/17 1008 by Mauri Tolen D, MD   Sign clinical note    

## 2017-03-16 ENCOUNTER — Telehealth (INDEPENDENT_AMBULATORY_CARE_PROVIDER_SITE_OTHER): Payer: Self-pay | Admitting: Orthopaedic Surgery

## 2017-03-16 ENCOUNTER — Other Ambulatory Visit (INDEPENDENT_AMBULATORY_CARE_PROVIDER_SITE_OTHER): Payer: Self-pay

## 2017-03-16 MED ORDER — MELOXICAM 15 MG PO TABS: 15.0000 mg | ORAL_TABLET | Freq: Every day | ORAL | 3 refills | Status: DC

## 2017-03-16 NOTE — Telephone Encounter (Signed)
Please advise 

## 2017-03-16 NOTE — Telephone Encounter (Signed)
Sent to pharmacy 

## 2017-03-16 NOTE — Progress Notes (Unsigned)
meloxicam

## 2017-03-16 NOTE — Telephone Encounter (Signed)
Patient request Rx for Meloxicam

## 2017-03-16 NOTE — Telephone Encounter (Signed)
Ok for this.  Meloxicam 15 mg daily as needed, #30, 3 refills

## 2017-04-02 ENCOUNTER — Ambulatory Visit: Payer: Self-pay | Admitting: Pulmonary Disease

## 2017-04-20 ENCOUNTER — Telehealth: Payer: Self-pay | Admitting: Pulmonary Disease

## 2017-04-20 NOTE — Telephone Encounter (Signed)
Spoke with patient. She states that SN has been trying to wean her off of the prednisone 20mg . She currently has been taking 10mg  for the past 3 weeks and it is not working. Her SOB gets worse during activity and according to her pulse ox, her O2 drops to the low 80s. At rest, her pulse ox shows her O2 between 90-93%. Denied any coughing or chest pain.  She wants to know what should she do.   SN, please advise. Thanks!

## 2017-04-20 NOTE — Telephone Encounter (Signed)
Per SN:  Alternate the Prednisone with 10mg  one day then 20mg  the next. Schedule f/u visit with SN.   Called and spoke to pt. Informed her of the recs per SN. Appt was already made for 04/27/17. Informed pt to seek emergency care of call our office if any s/s were to worsen. Pt verbalized understanding and denied any further questions or concerns at this time.

## 2017-04-27 ENCOUNTER — Other Ambulatory Visit (INDEPENDENT_AMBULATORY_CARE_PROVIDER_SITE_OTHER): Payer: BC Managed Care – PPO

## 2017-04-27 ENCOUNTER — Encounter: Payer: Self-pay | Admitting: Pulmonary Disease

## 2017-04-27 ENCOUNTER — Ambulatory Visit (INDEPENDENT_AMBULATORY_CARE_PROVIDER_SITE_OTHER): Payer: BC Managed Care – PPO | Admitting: Pulmonary Disease

## 2017-04-27 VITALS — BP 126/64 | HR 55 | Temp 98.0°F | Ht 65.0 in | Wt 318.4 lb

## 2017-04-27 DIAGNOSIS — I10 Essential (primary) hypertension: Secondary | ICD-10-CM | POA: Diagnosis not present

## 2017-04-27 DIAGNOSIS — Z7901 Long term (current) use of anticoagulants: Secondary | ICD-10-CM

## 2017-04-27 DIAGNOSIS — I272 Pulmonary hypertension, unspecified: Secondary | ICD-10-CM | POA: Diagnosis not present

## 2017-04-27 DIAGNOSIS — J849 Interstitial pulmonary disease, unspecified: Secondary | ICD-10-CM | POA: Diagnosis not present

## 2017-04-27 DIAGNOSIS — R601 Generalized edema: Secondary | ICD-10-CM

## 2017-04-27 DIAGNOSIS — G4733 Obstructive sleep apnea (adult) (pediatric): Secondary | ICD-10-CM | POA: Diagnosis not present

## 2017-04-27 DIAGNOSIS — I872 Venous insufficiency (chronic) (peripheral): Secondary | ICD-10-CM | POA: Diagnosis not present

## 2017-04-27 DIAGNOSIS — E079 Disorder of thyroid, unspecified: Secondary | ICD-10-CM

## 2017-04-27 DIAGNOSIS — I5032 Chronic diastolic (congestive) heart failure: Secondary | ICD-10-CM | POA: Diagnosis not present

## 2017-04-27 DIAGNOSIS — R609 Edema, unspecified: Secondary | ICD-10-CM | POA: Insufficient documentation

## 2017-04-27 DIAGNOSIS — I480 Paroxysmal atrial fibrillation: Secondary | ICD-10-CM | POA: Diagnosis not present

## 2017-04-27 DIAGNOSIS — M1711 Unilateral primary osteoarthritis, right knee: Secondary | ICD-10-CM

## 2017-04-27 DIAGNOSIS — M5136 Other intervertebral disc degeneration, lumbar region: Secondary | ICD-10-CM

## 2017-04-27 DIAGNOSIS — Z9884 Bariatric surgery status: Secondary | ICD-10-CM

## 2017-04-27 LAB — SEDIMENTATION RATE: SED RATE: 34 mm/h — AB (ref 0–30)

## 2017-04-27 LAB — BRAIN NATRIURETIC PEPTIDE: Pro B Natriuretic peptide (BNP): 201 pg/mL — ABNORMAL HIGH (ref 0.0–100.0)

## 2017-04-27 NOTE — Patient Instructions (Signed)
Today we updated your med list in our EPIC system...    Continue your current medications the same...  Today we checked some follow up blood work to help guide our therapy (the Prednisone & Fluid pills)...    We will contact you w/ the results when available...   Keep the Pred 20mg  at the one tab alternating w/ 1/2 tab every other day for now...  Continue to work on weight reduction & exercise to help your breathing ([facility]  Call for any questions, & we'll discuss timing of the follow up visit when I call.Marland Kitchen..Marland Kitchen

## 2017-04-27 NOTE — Progress Notes (Signed)
Subjective:     Patient ID: Heather [name], female   DOB: October 25, 1950, 66 y.o.   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   ~  March 22, 2015:  Initial pulmonary consult by SN>        66 y/o WF, referred by DrFitzgerald for a pulmonary evaluation due to dyspnea and pulmonary hypertension per 2DEcho>  Heather [name] notes SOB/ DOE for over a year now, she has DOE w/ ADLs gradually worse over that time; she denies cough, sputum, hemoptysis; she denies CP, palpit, dizzy/syncope; she has VI w/ periph edema... She was sent here for a pulm eval due to SOB/DOE w/ any activ and a 2DEcho done 02/14/15 in [place] showing norm LV size & function w/ EF=60-65% & norm wall motion, mild MR, mod LA&RA dil, norm RVF w/ severe pulmHTN (RVSP=80); prev 2DEcho 04/13/13 showed norm LVF w/ EF=55-60%, norm wall motion, no signif valve dis, mild-mod LAdil at 40m and PAsys=344mg; as far as causes for secondary pulmonaryHTN-- she has severe morbid obesity ([facility] OSA on CPAP per her PCP, mild-mod restrictive lung dis likely related to her massive obesity, and she needs additional work up to rule out other contributing factors...       Her [facility]ard [facility] she is followed for Morbid Obesity & OSA on CPAP- pt reports a 3 yr hx OSA, diagnosed by DrPickard on a sleep study and treated by him w/ CPAP; the only avail [place] w/ AHI=25/hr (52/hr in REM), and RDI=29/hr (54/hr in REM), assoc w/ oxygen desat to 76%, mod snoring, no limb movements- events noted to be worse supine & during REM;  She was supposed to have a CPAP tiration study but I can't find it in Epic, neither  can I find download data or a subseq ONO... [facility]>   <01/11/15> c/o SOB (tight & heavy in her chest), cough, wheezing, felt to have an asthma exac- given Pred, AlbutHFA, nasonex...  <01/29/15> c/o persistent wheezing & SOB, plus water retention from the Pred, & she was given Pulmicort-2spBid...   <02/28/15> f/u visit & recent CXR said to show incr edema so they increased Lasix & referred to Cards- DrFitzgerald...      [place] Dr[place]red in [place]> she has hx HBP, AFib first in 2004 ([facility] back in sinus rhythm from 2007-2013 & then 6 different cardioversions 2013-2015); last note from DrAllred was 02/03/14- she had cardioversion 12/2013 w/ improved energy but then went back into her atypical AFlutter; at that visit she denied CP, palpit, SOB, and he reported no edema; since she failed several antiarrhythmic meds & DCCV, she was rec to consider another ablation w/ DrFitzgerald... Last note from DrFitzgerald in Epic was 02/01/15- f/u from another cardioversion, on Amio & dose decr from 400=>300 due to elev level, pt holding NSR...        EXAM showed Afeb, VSS, O2sat=97% on RA at rest; Wt=356#, 5'5"Tall, BMI=59;  HEENT- neg, mallampati2;  Chest- decr BS at bases, w/o w/r/r;  Heart- RR gr1/6 SEM w/o r/g;  Abd- obese, neg;  Ext- VI w/ 4+edema...  CT Angio chest 03/06/09 (done for SOB & AFib) showed no PE, mild cardiomeg, otherw neg as well...  Sleep Study in [place] was 03/25/12 at the [facility] [place], [place]- it showed severe OSA w/ AHI=25/hr (52/hr in REM), and RDI=29/hr (54/hr in REM), assoc w/ oxygen desat to 76%, mod snoring, no limb movements- events noted to be worse supine & during REM...  Lexiscan Myoview 04/08/12 showed norm EF=60%, norm wall motion, soft tissue attenuation & no ischemia...  TEE 12/29/12 showed decr LVF w/ EF=40-45%, diffuse HK, no vegetations, no LA clot, no PFO or R=>L shunt, norm  RV...  2DEcho 04/13/13 showed norm LVF w/ EF=55-60%, norm wall motion, no signif valve dis, mild-mod LAdil at 46m and PAsys=370mg...  PFT 12/16/13 showed FVC=2.16 (64%), FEV1=1.70 (66%), %1sec=79, mid-flows are reduced at 63% predicted; Lung Vols are mildly reduced (TLC at 73%); DLCO mildly reduced at 74% predicted (VA is similarly reduced)...   CPAP Titration Study 06/12/14>  CPAP titrated to 11cmH2O w/ AHI=6.3/hr (titration lim by pt's inability to maintain sleep); she used nasal pillows, mean O2sat=93.4%, NSR w/ PACs, no signif movements,   CXR 01/30/15 showed cardiomegaly & prominent central pulm vascularity, mild incr in interstitial markings, no effusion, NAD...  [facility] [place] showed norm LV size & function w/ EF=60-65% & norm wall motion, mild MR, mod LA&RA dil, norm RVF w/ severe pulmHTN (RVSP=80)...  Ambulatory oxygen saturation test 03/22/15> O2sat=97% on RA at rest w/ pulse=54/min; she walked only 1 lap in the office & stopped due to fatigue & SOB- lowest O2sat=86% w/ pulse=83/min...  LABS 5-03/2015:  Chems- ok x BS=128, Cr=1.21, A1c=6.1;  CBC- Hg=11.2, MCV=86;  BNP=146;  TSH=9.71 (FreeT3=2.4, FreeT4=1.02)... Note Amio level 01/26/15=3.5 (1.0-2.5).  IMP/PLAN>>  CyDonnitaas severe SOB/DOE w/ ADLs and recent 2DEcho in [place] showing severe incr PAsys=802m;  The likely etiology of secondary pulmHTN is her severe morbid obesity (BMI=59), severe OSA & hypoxemia (see above);  [facility] & Epic shows f/u visits for 66yr66yr none since 2010 & she clearly didn't loose any weight ([facility] and to consider further options- ?gastric sleeve?);  She indicates that DrPickard started CPAP (she doesn't know the settings) & I cannot find orders or download data in Epic (we will call [facility] data);  Ambulatory O2 sat here today dropped to 86% on RA after one lap- she needs O2 w/ exercise and needs an ONO as well once we see the download data  regarding compliance; consider ABG to r/o OHS... Improving her PA pressure is likely going to depend on appropriate CPAP management, relieving hypoxemia, and losing weight...   ADDENDUM>>  Started on Home oxygen at 2L/min [place] => 24/7 due to her severe pulmHTN ADDENDUM>> We received download data from [facility] w/ humidifier)- good compliance w/ CPAP: 90/90 days (Apr-Jun2016), 7-8hrs per night, CPAP=11, AHI on CPAP=1.5... AMarland KitchenMarland KitchenENDUM>>  ONO on CPAP, on O2 at 2L/min => pending (it was done 04/05/15 ON ROOM AIR w/ desat to 74%, <88% for 3hrs) ADDENDUM>>  04/12/15- [facility] [place]- note reviewed, wt=363#, BMI=59.5, they added 0.5cc to lap-band (total 5.0cc), reviewed diet/ nutrition consult etc & goal <300# or consider additional surg. ADDENDUM>>  04/17/15- ONO on O2 at 2L/min & CPAP showed she was still desaturating to <88% for 49.5min23mDesat events <88%=41 (Desat index of 11/hr); WE WILL  INCR NOCTURNAL O2 to 3L/min  ~  June 11, 2016:  46moROV & pulmonary [facility]>  After initial pulmonary consult 03/22/15 pt started on O2 and had the indicated CPAP download and ONO data but never returned to our office for recheck;  She was last increased to 3L/min added to her CPAP for the nocturnal hypoxemia and is now in need of another ONO to prove that this is adeq to keep her O2sats >90%, and she needs a 2DEcho to reassess her PAsys pressures... DrNewman is planning further Bariatiric surg in the near future & needs pulmonary surgical clearance...     She saw DrFitzgerald, Cards 06/03/16 & note reviewed in Care Everywhere>  HBP, PAF on Amio & s/p mult ablation procedures, OSA, PulmHTN- on Eliquis5Bid, Amio200, Metop25Bid, CardizemCD120, Losartan100, Lasix20-2Qam; she is holding NSR and cleared for bariatric surg by Cards;  NOTE-- Care Everywhere records indicate that she had a f/u 2DEcho [id number] the results are NOT avail & not referenced in any of  DrFitzgerald's cardiac notes (we need f/u 2DEcho to re-assess her est PAsys pressure while on adeq O2 supplementation...     From the pulmonary standpoint Heather [name] improved- less SOB but still w/ DOE after walking "long distances", she is again singing in the choir but needs her oxygen; she notes sl cough, denies sput production/ hemoptysis/ CP etc; We reviewed the following medical problems during today's office visit >>     Severe DYSPNEA>  She reports sl improved w/ wt loss so far & home oxygen therapy...    Nocturnal & exercise hypoxemia>  She desats to 86% on RA w/ min walking & signif O2 desats Qhs despite CPAP=> O2 incr to 3L/min bled into machine.    Pulmonary HTN>  2DEcho in [place] by DrFitagerald 5/16 indicated PAsys~80 (in 2014 it was ~34)...    OSA>  Initial sleep study by DrPickard & CPAP prescribed & followed by him; CPAP download from spring2016 showed AHI=1.5 on CPAP 11...    Morbid Obesity> Wt=356 lbs in Jun2016 & she is down to 311 lbs today; DFranki Monteis planning to convert her Lap band to Sleeve gastrectomy soon...    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    HBP>  On Metop25Bid, CardizenCD120, Losar100, Lasix40 & BP=128/74...    PAF>  On Eliquis5Bid & Amio200; she has had numerous ablation procedures; now holding NSR on the amio...    Ven Insuffic/ Edema>  On Lasix40 & the low salt diet...    Hypothyroid>  On Synthroid100 w/ TSH followed by DrPickard... EXAM shows pear-shaped body habitus, Afeb, VSS, O2sat=99% on RA at rest; Wt=311#, 5'5"Tall, BMI=52;  HEENT- neg, mallampati2;  Chest- decr BS at bases, w/o w/r/r;  Heart- RR gr1/6 SEM w/o r/g;  Abd- obese, neg;  Ext- VI w/ 1+edema...  Ambulatory Oximetry on 2L/min [place]>  O2sat=100% on 2L/min at rest w/ pulse=58/min;  She was able to ambulate just 1Lap (185') on the 2L O2 w/ lowest sat=100% w/ pulse=88/min... IMP/PLAN>>  We need to obtain an ONO on her 3L/min flow to prove  that she is no longer desaturating at night on her CPAP; it would also be instructive for a repeat 2DEcho to recheck her PAsys est pressure; she is OK for the surgery which we hope will be life saving given these life threatening complications from her morbid obesity...   ADDENDUM>>  06/30/16- ONO on O2 at  3L/min & her CPAP showed NO DESATURATIONS- all O2sat>90% on 3L/min SHE STILL NEEDS A FOLLOW UP 2DEcho TO RECHECK HER ESTIMATED PAsys PRESSURE and assess benefit from the O2  ~  January 29, 2017:  72moROV & pulmonary follow up visit>  Heather [name] here w/ her husb today- she tells me that her insurance would not approve the Bariatic Surg (gastric sleeve) since she had already had the LapBand & didn't follow up w/ DrNewman on that one;  [facility] but it was again refused & I registered my sincere disappointment as I viewed the Sleeve surg as life-saving for her w/ her morbid obesity, OSA and severe pulm hypertension;  She never followed up w/ uKoreaeither but has remained faithful to her CPAP and OXYGEN therapy;  Recently she noted an increase in DYSPNEA and had a follow up visit w/ her Cardiologist- [place]Fitzgerald in [place](01/[place]/17 note reviewed)> HBP, PAF/ Flutter on Amio w/ mult prior ablation procedures, Morbid Obesity, OSA, & PulmHTN>  CXR showed incr interstitial markings suggesting CHF & her BNP was sl elev at 250;  Her Lasix was increased & she was sched for a f/u 2DEcho, the extra Lasix seemed to help the dyspnea;  DrFitsgerald stopped the Amio & placed her on Multaq, she remains on Eliquis as well, he wanted uKoreato recheck pt to see if she would benefit from Pred...     She notes 171mox incr SOB/DOE, notes she hasn't been able to walk as far or as long, then suddenly much worse w/ incr SOB w/ less activity; mild cough, occas mild sput production (no color, no blood), she denies CP, palpit, but notes sl incr edema as noted... She hasn't seen drPickard in ~1y86yrCXR 01/19/17 showed cardiomegaly,  bilat diffuse interstitial thickening (new from 01/2015)- no focal consolidation, effusion, etc;  Note> she went to see Ortho 10/2016 (DrDean & BlaNinfa Linden/ right knee pain & Sed elev at 88 & CRP was a sky-high 286; she prev received steroid shots 7 arthroscopic surg- was given Synvisc injection this time;  We discussed need for further eval & ILD work-up>>     EXAM shows pear-shaped body habitus, Afeb, VSS, O2sat=91% on RA at rest; Wt=303#, 5'5"Tall, BMI=51;  HEENT- neg, mallampati2;  Chest- decr BS at bases, w/o w/r/r;  Heart- RR gr1/6 SEM w/o r/g;  Abd- obese, neg;  Ext- VI w/ 1+edema...  LABS 10/31/16 by DrSDean, Ortho>  Sed=88, CRP=286  CPAP Download 3/14 - 01/22/17>  Excellent compliance- used 30/30 days for 8+hrs per night, CPAP set 11, min air leak, AHI=1.3 per hr... Continue same.  CXR 01/19/17>  Cardiomegaly, bilat diffuse interstitial thickening (new from 01/2015)- no focal consolidation, effusion, etc...  LABS 01/2017>  Chems- wnl w/ BS=105, Cr=1.03, BNP=250=>158 (after incr Lasix dose);  CBC- ok w/ borderline Hg=12.5, mcv=87;  TSH=5.37 on Levothy50 (rec to inc to 27m32m);    Collagen-Vasc screen 01/2017>  Quantiferon Gold= INDETERM;  Sed=122;  RA factor= NEG (<14);  Anti-CCP= NEG (<16);  ANA=neg;  ANCA=NEG;  ACE=76 (9-67);  Sjogrens SSA (Ro) & SSB (La)= NEG;  Scleroderma Scl-70= NEG;  CPK= 47 (7-177);  RNP= +6.5 (nl<1.0) => Pending:  dsDNA antibodies (=neg) and Sm Antibodies (=neg). Therefore no serologic evid for Lupus, Scleroderma, Sjogrens, or MCTD...  2DEcho 02/03/17> norm LV w/ EF=60-65% & no regional wall motion abn, +Gr2DD, Ao root is wnl, AoV ok w/ mild AI, MV ok w/ trivMR, LA normal, RV normal, RA mod  dil & PAsys~4mHg  Hi-res CT Chest 02/09/17> cardiomeg, Ao atherosclerosis, ectatic ascending Ao ~4cm & no ch from 2010, dilated main PA at 3.8cm (prev 3.4);  Few mildly enlarged mediastinal nodes (largest1.5cm right paratrach area);  Numerous poorly marginated subsolid nodules bilaterally  (most prom in LL's- eg 1.2cm RLL & 1.0cm LLL); note- no subpleural reticulation, traction bronchiectasis, parenchymal banding, architectural distortion or honeycombing); mild interlobar septal thickening (suggesting mild pulm edema) +thoracic spndylosis... Overall pattern suggesting infections vs atypic inflamm etiologies like COP (cryptogenic organizing pneumonitis)...  IMP/PLAN>>  CValeenhas a complex medical picture w/ underlying Morbid Obesity, Restrictive pulm dis, chronic hypoxemic resp failure on O2, & OSA on CPAP=> causing secondary pulm HTN (Prob WHO Group 3);  On effective CPAP therapy & 24/7 O2 w/ resolution of her hypoxemia- her PA pressure has improved from ~80 (02/2015) to ~65 at present, unfortunately she was turned down by her insurance company for further Bariatric surg as I still believe that a successful gastric sleeve procedure could prove life saving!         Now she presents w/ a superimposed acute dyspnea w/ CXR abn thought to suggest early CHF & a BNP=250 + 2DEcho w/ good LVF but Gr2DD, placed on Lasix w/ only min improvement;  Note recent Ortho eval w/ very high inflamm markers- treated w/ synvisc;  Further Pulm eval reveals Hi-res CT Chest suggesting COP & collagen vasc screen w/ a few unexplained random marker abn (eg- neg ANA & ANCA but pos RNP, plus the ACE level is sl elev & inflamm markers remain very high (sed=122);  She was placed on empiric trial of PRED pending some of our studies and she's had a good clinical response w/ improvement in her breathing (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  We will have her ret in 169moo assess response & recheck labs.      Note that CARDS- DrFitzgerald has stopped her Amio in favor of Multaq for her PAF & she continues to hold NSR... Note: >50% of this 6054mappt was spent in counseling & coordination of care...  ~  Mar 02, 2017:  14mo49mo & pulm follow up visit> last visit CyntSimona started on Pred due to serology showing elev Sed &  ACE plus hi-res CT c/w COP;  She reports much improved on the Pred 20mg4m x 2wks then 20mg/73ml now;  Despite counseling re appetite & oral intake- she has gained 14# to 316# today!  She is unable to exercise due to her right knee- she has seen DrCBlackman, Ortho & he did arthrocentesis & injection, tried Synvisc, tried Xylocaine & Depomedrol...     EXAM shows pear-shaped body habitus, Afeb, VSS, O2sat=97% on O2 at 2L/min pulse; Wt=316#, 5'5"Tall, BMI=52;  HEENT- neg, mallampati2;  Chest- decr BS at bases, w/o w/r/r;  Heart- RR gr1/6 SEM w/o r/g;  Abd- obese, neg;  Ext- VI w/ 1+edema...  CXR 03/02/17 (independently reviewed by me in the PACS system) showed cardiomegaly, clear lungs/ NAD- no vasc congestion, post surg changes in LUQ, mild DDD in Tspine...  LABS 03/02/17>  Sed improved down to 28, and ACE improved down to 25 on the Pred; RNP antibody is still pos at 6.9 (no change) IMP/PLAN>>  Heather [name]- breathing better on the Pred & inflamm markers are much better;  This favors the Dx of COP & we will continue to wean the Pred from 20mg/d58m to 20-10 qod for 3wks and then 10mg/d 57m  return in 6wks;  She is admonished to get on a vigorous low carb, low fat diet & get her weight down, again I am in favor of Bariatric surg as a life-saving procedure for her- she still has severe secondary pulmHTN on treatment OSA, morbid obesity, restrictive pulm dis...     ~  April 27, 2017:  23moROV & after her last visit Heather [name] her Pred for what appears to be COP from 238md w/ +- clear CXR & ACE down to 25 & Sed=28, down to 20-10 Qod for 3wks then 107m;  She called w/ worsening symptoms & we bumped her back up to 20-10Qod w/ f/u today;  She reports still not feeling well & notices some "shakes" (no tremor on exam);  She notes she can't exercise due to her knee arthritis and her breathing;      She has a complex picture w/ underlying restrictive lung dis, morbid obesity, OSA & pulmHTN- on CPAP, O2, and  attempts at diet & exercise, she refused bariatric approach at age [age]Then she developed superimposed ILD w/ abn CXR & hi-res CT showing c/w COP, inflamm markers were thru the roof w/ Sed=122, an elev ACE & pos RNP-Ab (all of which improved on PRED rx);  CXR improved & we were weaning the Pred when she had this set-back...     She remains on CPAP Qhs & reports doing well, resting satis, denies daytime hy[persomnolence...     EXAM shows pear-shaped body habitus, Afeb, VSS, O2sat=95% on O2 at 2L/min pulse; Wt=318#, 5'5"Tall, BMI=52;  HEENT- neg, mallampati2;  Chest- decr BS at bases, w/o w/r/r;  Heart- RR gr1/6 SEM w/o r/g;  Abd- obese, neg;  Ext- VI w/ 1+edema...  LABS 04/27/17>  Sed=34 & BNP=201 (she has Gr2DD on 2DEcho & we decided to incr the Lasix40 to 36m41mm, keep the Pred at 20-10 Qod... IMP/PLAN>>  We decided to continue the Pred at 20-10 Qod & incr her Lasix from 40mg83mo 36mg 51mw/ ROV recheck in 85month52monthPast Medical History:  Diagnosis Date  . Anemia    "onc;e; had to take iron for awhile"  . Atrial fibrillation (HCC)   [place] in 2004.  [facility]  She is off coumadin  now with no noted recurrent atrial fibrillation since 2007. til 04/01/12; s/p initiation of Rhythmol with DCCV June 2013  . Carpal tunnel syndrome   . Degenerative disk disease   . GERD (gastroesophageal reflux disease)    resolved after lap band  . History of blood transfusion    "w/both hip replacements"  . HTN (hypertension)   . Hypothyroidism   . IBS (irritable bowel syndrome)   . Interstitial cystitis   . Morton's neuroma    right foot  . Obesity   . On home oxygen therapy   . Pulmonary hypertension (HCC)   [place] of breath    only without oxygen  . Sleep apnea 04/01/12   "dx'd just last week"  . Sleep apnea    uses CPAP, 11 CWP with residual AHI 6.3    Past Surgical History:  Procedure Laterality Date  . ANTERIOR AND POSTERIOR REPAIR  10/20/2012    Procedure: ANTERIOR (CYSTOCELE) AND POSTERIOR REPAIR (RECTOCELE);  Surgeon: Sandra Alwyn PeaLocation: [facility] OR[facility];[place] CreekService: Gynecology;  Laterality: N/A;  2 hours  . ATRIAL FIBRILLATION ABLATION N/A 12/30/2012   Procedure: ATRIAL FIBRILLATION ABLATION;  Surgeon: Thompson Grayer, MD;  [facility] CATH LAB;  Service: Cardiovascular;  Laterality: N/A;  . BACK SURGERY    . CARDIAC CATHETERIZATION    . CARDIAC ELECTROPHYSIOLOGY MAPPING AND ABLATION  ~ 2005 and 2007   "did more 2nd time; both at [facility]"  . CARDIOVERSION  04/02/2012   Procedure: CARDIOVERSION;  Surgeon: Larey Dresser, MD;  Location: [place];  Service: Cardiovascular;  Laterality: N/A;  . CARDIOVERSION  06/16/2012   Procedure: CARDIOVERSION;  Surgeon: Thayer Headings, MD;  Location: [place];  Service: Cardiovascular;  Laterality: N/A;  amanda/ebp/[place]ever[place]( or scheduling)  . CARDIOVERSION  10/29/2012   Procedure: CARDIOVERSION;  Surgeon: Larey Dresser, MD;  Location: [facility] ENDOSCOPY;  Service: Cardiovascular;  Laterality: N/A;  . CARDIOVERSION N/A 03/30/2013   Procedure: CARDIOVERSION;  Surgeon: Larey Dresser, MD;  Location: [place];  Service: Cardiovascular;  Laterality: N/A;  . CARDIOVERSION N/A 12/23/2013   Procedure: CARDIOVERSION;  Surgeon: Dorothy Spark, MD;  Location: [place];  Service: Cardiovascular;  Laterality: N/A;  9:27 Propofol 48m, IV   150 joules synched shock by Dr. NMeda Coffee@ 150 joules...SR   post 12 lead ordered.   . CARPAL TUNNEL RELEASE  1990's   bilaterally  . DILATION AND CURETTAGE OF UTERUS  2000  . JOINT REPLACEMENT     bilateral hips  . KNEE ARTHROSCOPY Right 11/18/2016   Procedure: RIGHT KNEE ARTHROSCOPY WITH DEBRIDEMENT;  Surgeon: CMcarthur Rossetti MD;  [place]  Service: Orthopedics;  Laterality: Right;  . LAPAROSCOPIC GASTRIC BANDING  2009  . LASIK    . POSTERIOR FUSION LUMBAR SPINE  2000's   "nerve problems"  . POSTERIOR FUSION LUMBAR SPINE  2000's  . TEE WITHOUT  CARDIOVERSION N/A 12/29/2012   Procedure: TRANSESOPHAGEAL ECHOCARDIOGRAM (TEE);  Surgeon: Peter M JMartinique MD;  [place]  Service: Cardiovascular;  Laterality: N/A;  . [place] TOTAL HIP ARTHROPLASTY  ~2009; 2010   right; left  . TUBAL LIGATION  1980  . VAGINAL HYSTERECTOMY  2000    Outpatient Encounter Prescriptions as of 04/27/2017  Medication Sig  . acetaminophen-codeine (TYLENOL #3) 300-30 MG tablet Take 1 tablet by mouth every 6 (six) hours as needed. for pain  . amitriptyline (ELAVIL) 10 MG tablet Take 1 tablet (10 mg total) by mouth at bedtime.  . cetirizine (ZYRTEC) 10 MG tablet Take 10 mg by mouth daily.  . Cholecalciferol (VITAMIN D) 2000 UNITS tablet Take 2,000 Units by mouth daily.  . clonazePAM (KLONOPIN) 0.5 MG tablet Take 1/2 to 1 tablet by mouth two times daily  . diclofenac sodium (VOLTAREN) 1 % GEL Apply 2 g topically 4 (four) times daily.  .Marland Kitchendiltiazem (CARDIZEM CD) 120 MG 24 hr capsule Take 1275mby mouth daily  . dronedarone (MULTAQ) 400 MG tablet Take 400 mg by mouth 2 (two) times daily with a meal.  . ELIQUIS 5 MG TABS tablet TAKE 1 TABLET BY MOUTH TWICE A DAY....NEED OFFICE VISIT BEFORE ANY MORE REFILLS  . furosemide (LASIX) 40 MG tablet Take 40 mg by mouth daily.  . Marland KitchenuaiFENesin (MUCINEX) 600 MG 12 hr tablet Take 600 mg by mouth 2 (two) times daily as needed. For allergies.  . Marland Kitchenevothyroxine (SYNTHROID, LEVOTHROID) 75 MCG tablet Take 1 tablet (75 mcg total) by mouth daily before breakfast.  . losartan (COZAAR) 100 MG tablet TAKE 1 TABLET BY MOUTH  DAILY  . Magnesium 250 MG TABS Take 250 mg by mouth 2 (two)  times daily.  . meloxicam (MOBIC) 15 MG tablet Take 1 tablet (15 mg total) by mouth daily.  . metoprolol tartrate (LOPRESSOR) 25 MG tablet Take 25 mg by mouth 2 (two) times daily.   . predniSONE (DELTASONE) 20 MG tablet TAKE AS DIRECTED   No facility-administered encounter medications on file as of 04/27/2017.      Allergies  Allergen Reactions  . Cephalexin Hives and Other (See Comments)    "throat started closing up"  . Rocephin [Ceftriaxone Sodium In Dextrose] Anaphylaxis  . Penicillins Rash    Has patient had a PCN reaction causing immediate rash, facial/tongue/throat swelling, SOB or lightheadedness with hypotension: no Has patient had a PCN reaction causing severe rash involving mucus membranes or skin necrosis: no Has patient had a PCN reaction that required hospitalization no Has patient had a PCN reaction occurring within the last 10 years: no If all of the above answers are "NO", then may proceed with Cephalosporin use.  . Flecainide Acetate Other (See Comments)    "couldn't take it"  . Moxifloxacin     Does not remember the reaction  . Sulfonamide Derivatives     Does not remember reaction ; "was so young when I had reaction to it"    Immunization History  Administered Date(s) Administered  . Influenza Split 07/13/2016  . Influenza-Unspecified 11/04/2012, 08/24/2014  . Zoster 03/24/2013    Current Medications, Allergies, Past Medical History, Past Surgical History, Family History, and Social History were reviewed in [employer] record.   Review of Systems            All symptoms NEG except where BOLDED >>  Constitutional:  F/C/S, fatigue, anorexia, unexpected weight change. HEENT:  HA, visual changes, hearing loss, earache, nasal symptoms, sore throat, mouth sores, hoarseness. Resp:  cough, sputum, hemoptysis; SOB, tightness, wheezing. Cardio:  CP, palpit, DOE, orthopnea, edema. GI:  N/V/D/C, blood in stool; reflux, abd pain, distention, gas. GU:  dysuria, freq, urgency, hematuria, flank pain, voiding difficulty. MS:  joint pain, swelling, tenderness, decr ROM; neck pain, back pain, etc. Neuro:  HA, tremors, seizures, dizziness, syncope, weakness, numbness, gait abn. Skin:  suspicious lesions or skin rash. Heme:  adenopathy, bruising,  bleeding. Psyche:  confusion, agitation, sleep disturbance, hallucinations, anxiety, depression suicidal.   Objective:   Physical Exam      Vital Signs:  Reviewed...   General:  WD, morbidly obese, 66 y/o WF in NAD; alert & oriented; pleasant & cooperative... HEENT:  [place]/AT; Conjunctiva- pink, Sclera- nonicteric, EOM-wnl, PERRLA, EACs-clear, TMs-wnl; NOSE-clear; THROAT-clear & wnl.  Neck:  Supple w/ decr ROM; no JVD; normal carotid impulses w/o bruits; no thyromegaly or nodules palpated; no lymphadenopathy.  Chest:  decr BS bilat at bases, but clear without wheezes, rales, or rhonchi heard. Heart:  Regular Rhythm; gr1/6SEM without rubs or gallops detected. Abdomen:  Obese, soft & nontender- no guarding or rebound; normal bowel sounds; no organomegaly or masses palpated. Ext:  decrROM; +arthritic changes; no varicose veins, +venous insuffic, 2+ edema;  Pulses intact w/o bruits. Neuro:  No focal neuro deficits, +gait abnormality & balance fair... Derm:  No lesions noted; no rash etc. Lymph:  No cervical, supraclavicular, axillary, or inguinal adenopathy palpated.   Assessment:      IMP >>     Severe DYSPNEA>  She reports sl improved w/ wt loss so far & home oxygen therapy...    Nocturnal & exercise hypoxemia>  She desats to 86% on RA w/ min walking & signif  O2 desats Qhs despite CPAP=> O2 incr to 3L/min bled into machine.    Pulmonary HTN>  2DEcho in [place] by DrFitagerald 5/16 indicated PAsys~80 (in 2014 it was ~34)...    OSA>  Initial sleep study by DrPickard & CPAP prescribed & followed by him; CPAP download from spring2016 showed AHI=1.5 on CPAP 11...    Morbid Obesity> Wt=356 lbs in Jun2016 & she is down to 311 lbs today; Franki Monte is planning to convert her Lap band to Sleeve gastrectomy soon...    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    HBP>  On Metop25Bid, CardizenCD120, Losar100, Lasix40 & BP=128/74...    PAF>  On  Eliquis5Bid & Amio200; she has had numerous ablation procedures; now holding NSR on the amio...    Ven Insuffic/ Edema>  On Lasix40 & the low salt diet...    Hypothyroid>  On Synthroid100 w/ TSH followed by DrPickard...  PLAN >>  03/22/15>   Tarra has severe SOB/DOE w/ ADLs and recent 2DEcho in [place] showing severe incr PAsys=90mHg;  The likely etiology of secondary pulmHTN is her severe morbid obesity (BMI=59), severe OSA & hypoxemia (see above);  [facility] & Epic shows f/u visits for [place] none since 2010 & she clearly didn't loose any weight ([facility] and to consider further options- ?gastric sleeve?);  She indicates that DrPickard started CPAP (she doesn't know the settings) & I cannot find orders or download data in Epic (we will call [facility] data);  Ambulatory O2 sat here today dropped to 86% on RA after one lap- she needs O2 w/ exercise and needs an ONO as well once we see the download data regarding compliance; consider ABG to r/o OHS... Improving her PA pressure is likely going to depend on appropriate CPAP management, relieving hypoxemia, and losing weight. 06/11/16>   We need to obtain an ONO on her 3L/min flow to prove that she is no longer desaturating at night on her CPAP; it would also be instructive for a repeat 2DEcho to recheck her PAsys est pressure; she is OK for the surgery which we hope will be life saving given these life threatening complications from her morbid obesity.  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       Now she presents w/ a superimposed acute dyspnea w/ CXR abn thought to suggest early CHF & a BNP=250 + 2DEcho w/ good LVF but Gr2DD, placed on Lasix w/ only min improvement;  Note recent Ortho eval w/ very high inflamm markers- treated w/ synvisc;  Further Pulm eval reveals Hi-res CT Chest suggesting COP & collagen vasc screen w/ a few unexplained random marker abn (eg- neg ANA & ANCA but pos RNP, plus the ACE level is sl elev & inflamm markers remain very high (sed=122);  She was placed on empiric trial of PRED pending some of our studies and she's had a good clinical response w/ improvement in her breathing (this  would be c/w the superimposed COP)...  We will have her ret in 59moto assess response & recheck labs. 03/02/17>   CCalandriais improved- breathing better on the Pred & inflamm markers are much better;  This favors the Dx of COP & we will continue to wean the Pred from 280md now to 20-10 qod for 3wks and then 1025m til return in 6wks;  She is admonished to get on a vigorous low carb, low fat diet & get her weight down, again I am in favor of Bariatric surg as a life-saving procedure for her- she still has severe secondary pulmHTN on treatment OSA, morbid obesity, restrictive pulm dis.   04/27/17>   We decided to continue the Pred at 20-10 Qod & incr her Lasix from 55m33mto 80mg62m w/ ROV recheck in 1mont1monthan:     Patient's Medications  New Prescriptions   No medications on file  Previous Medications   ACETAMINOPHEN-CODEINE (TYLENOL #3) 300-30 MG TABLET    Take 1 tablet by mouth every 6 (six) hours as needed. for pain   AMITRIPTYLINE (ELAVIL) 10 MG TABLET    Take 1 tablet (10 mg total) by mouth at bedtime.   CETIRIZINE (ZYRTEC) 10 MG TABLET    Take 10 mg by mouth daily.   CHOLECALCIFEROL (VITAMIN D) 2000 UNITS TABLET    Take 2,000 Units by mouth daily.   CLONAZEPAM (KLONOPIN) 0.5 MG TABLET    Take 1/2 to 1 tablet by mouth two times daily    DICLOFENAC SODIUM (VOLTAREN) 1 % GEL    Apply 2 g topically 4 (four) times daily.   DILTIAZEM (CARDIZEM CD) 120 MG 24 HR CAPSULE    Take 120mg b20muth daily   DRONEDARONE (MULTAQ) 400 MG TABLET    Take 400 mg by mouth 2 (two) times daily with a meal.   ELIQUIS 5 MG TABS TABLET    TAKE 1 TABLET BY MOUTH TWICE A DAY....NEED OFFICE VISIT BEFORE ANY MORE REFILLS   FUROSEMIDE (LASIX) 40 MG TABLET    Take 2 tabs by mouth daily- Qam   GUAIFENESIN (MUCINEX) 600 MG 12 HR TABLET    Take 600 mg by mouth 2 (two) times daily as needed. For allergies.   LEVOTHYROXINE (SYNTHROID, LEVOTHROID) 75 MCG TABLET    Take 1 tablet (75 mcg total) by mouth daily before breakfast.   LOSARTAN (COZAAR) 100 MG TABLET    TAKE 1 TABLET BY MOUTH  DAILY   MAGNESIUM 250 MG TABS    Take 250 mg by mouth 2 (two) times daily.   MELOXICAM (MOBIC) 15 MG TABLET    Take 1 tablet (15 mg total) by mouth daily.   METOPROLOL TARTRATE (LOPRESSOR) 25 MG TABLET    Take 25 mg by mouth 2 (two) times daily.    PREDNISONE (DELTASONE) 20 MG TABLET    TAKE AS DIRECTED -- currently 20-10 Qod  Modified Medications   No medications on file  Discontinued Medications   No medications on file

## 2017-05-26 ENCOUNTER — Ambulatory Visit (INDEPENDENT_AMBULATORY_CARE_PROVIDER_SITE_OTHER): Payer: BC Managed Care – PPO | Admitting: Physician Assistant

## 2017-05-26 DIAGNOSIS — M1711 Unilateral primary osteoarthritis, right knee: Secondary | ICD-10-CM

## 2017-05-26 MED ORDER — ACETAMINOPHEN-CODEINE #3 300-30 MG PO TABS: 1.0000 | ORAL_TABLET | Freq: Four times a day (QID) | ORAL | 0 refills | Status: DC | PRN

## 2017-05-26 NOTE — Progress Notes (Signed)
   Procedure Note  Patient: Heather EatonCynthia M [name]             Date of Birth: Feb 12, 1951           MRN: [id number]             Visit Date: 05/26/2017  History of present illness: Mrs. Heather [name] returns today complaining of right knee pain. She is wanting another cortisone injection in her right knee. She states that the meloxicam did help with her knee pain considerably however she is on prednisone and cannot take it. She's had no new injury to the right knee.  Procedures: Visit Diagnoses: Primary osteoarthritis of right knee  Large Joint Inj Date/Time: 05/26/2017 11:09 AM Performed by: Kirtland BouchardLARK, Gracilyn Gunia W Authorized by: Kirtland BouchardLARK, [place] Gamero W   Consent Given by:  Patient Indications:  Pain Location:  Knee Site:  R knee Needle Size:  22 G Approach:  Anterolateral Ultrasound Guidance: No   Fluoroscopic Guidance: No   Medications:  40 mg methylPREDNISolone acetate 40 MG/ML; 3 mL lidocaine 1 % Aspiration Attempted: No   Patient tolerance:  Patient tolerated the procedure well with no immediate complications   Physical exam: Right knee she has good extension and flexion of the knee without significant pain. Tenderness medial lateral joint line. No effusion abnormal warmth erythema.   Plan: Follow with us on an as-needed basis. She is given Tylenol No. 3 for pain she see use these sparingly. She understands that she can have cortisone injections this frequently necessary 3 months. Questions encouraged and answered

## 2017-05-27 MED ORDER — METHYLPREDNISOLONE ACETATE 40 MG/ML IJ SUSP
40.0000 mg | INTRAMUSCULAR | Status: AC | PRN
  Administered 2017-05-26: 40 mg via INTRA_ARTICULAR

## 2017-05-27 MED ORDER — LIDOCAINE HCL 1 % IJ SOLN
3.0000 mL | INTRAMUSCULAR | Status: AC | PRN
  Administered 2017-05-26: 3 mL

## 2017-05-28 ENCOUNTER — Ambulatory Visit (INDEPENDENT_AMBULATORY_CARE_PROVIDER_SITE_OTHER): Payer: BC Managed Care – PPO | Admitting: Pulmonary Disease

## 2017-05-28 ENCOUNTER — Other Ambulatory Visit (INDEPENDENT_AMBULATORY_CARE_PROVIDER_SITE_OTHER): Payer: BC Managed Care – PPO

## 2017-05-28 VITALS — BP 128/74 | HR 55 | Temp 97.7°F | Ht 65.0 in | Wt 320.5 lb

## 2017-05-28 DIAGNOSIS — R601 Generalized edema: Secondary | ICD-10-CM | POA: Diagnosis not present

## 2017-05-28 DIAGNOSIS — I872 Venous insufficiency (chronic) (peripheral): Secondary | ICD-10-CM | POA: Diagnosis not present

## 2017-05-28 DIAGNOSIS — I5032 Chronic diastolic (congestive) heart failure: Secondary | ICD-10-CM | POA: Diagnosis not present

## 2017-05-28 DIAGNOSIS — I10 Essential (primary) hypertension: Secondary | ICD-10-CM

## 2017-05-28 DIAGNOSIS — G4733 Obstructive sleep apnea (adult) (pediatric): Secondary | ICD-10-CM | POA: Diagnosis not present

## 2017-05-28 DIAGNOSIS — E079 Disorder of thyroid, unspecified: Secondary | ICD-10-CM

## 2017-05-28 DIAGNOSIS — M1711 Unilateral primary osteoarthritis, right knee: Secondary | ICD-10-CM

## 2017-05-28 DIAGNOSIS — J849 Interstitial pulmonary disease, unspecified: Secondary | ICD-10-CM

## 2017-05-28 DIAGNOSIS — I272 Pulmonary hypertension, unspecified: Secondary | ICD-10-CM | POA: Diagnosis not present

## 2017-05-28 DIAGNOSIS — Z7901 Long term (current) use of anticoagulants: Secondary | ICD-10-CM

## 2017-05-28 DIAGNOSIS — Z9884 Bariatric surgery status: Secondary | ICD-10-CM

## 2017-05-28 DIAGNOSIS — M5136 Other intervertebral disc degeneration, lumbar region: Secondary | ICD-10-CM

## 2017-05-28 DIAGNOSIS — I480 Paroxysmal atrial fibrillation: Secondary | ICD-10-CM

## 2017-05-28 LAB — BRAIN NATRIURETIC PEPTIDE: Pro B Natriuretic peptide (BNP): 166 pg/mL — ABNORMAL HIGH (ref 0.0–100.0)

## 2017-05-28 LAB — BASIC METABOLIC PANEL
BUN: 38 mg/dL — AB (ref 6–23)
CHLORIDE: 98 meq/L (ref 96–112)
CO2: 32 meq/L (ref 19–32)
Calcium: 9.4 mg/dL (ref 8.4–10.5)
Creatinine, Ser: 1.41 mg/dL — ABNORMAL HIGH (ref 0.40–1.20)
GFR: 39.8 mL/min — ABNORMAL LOW (ref >60.00)
GLUCOSE: 103 mg/dL — AB (ref 70–99)
POTASSIUM: 3.8 meq/L (ref 3.5–5.1)
Sodium: 140 mEq/L (ref 135–145)

## 2017-05-28 LAB — SEDIMENTATION RATE: Sed Rate: 26 mm/hr (ref 0–30)

## 2017-05-28 MED ORDER — LEVOTHYROXINE SODIUM 75 MCG PO TABS: 75.0000 ug | ORAL_TABLET | Freq: Every day | ORAL | 11 refills | Status: DC

## 2017-05-28 NOTE — Patient Instructions (Signed)
Today we updated your med list in our EPIC system...    Continue your current medications the same...  We decided to decrease the Prednisone to 10mg  each AM...  We wrote a new prescription for the non-generic SYNTHROID 75 mcg tabs-     Continue one tab each AM on empty stomach...  Today we rechecked your blood work on the Pred & Lasix...    We will contact you w/ the results when available...   Call for any questions...  Let's plan a follow up visit in 108mo, trying to decrease the Pred dose further before the planned bariatric surg.Marland Kitchen..Marland Kitchen

## 2017-05-29 ENCOUNTER — Encounter: Payer: Self-pay | Admitting: Pulmonary Disease

## 2017-05-29 NOTE — Progress Notes (Signed)
Subjective:     Patient ID: Heather [name], female   DOB: 03-Sep-1951, 66 y.o.   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   ~  March 22, 2015:  Initial pulmonary consult by SN>        65 y/o WF, referred by DrFitzgerald for a pulmonary evaluation due to dyspnea and pulmonary hypertension per 2DEcho>  Heather [name] notes SOB/ DOE for over a year now, she has DOE w/ ADLs gradually worse over that time; she denies cough, sputum, hemoptysis; she denies CP, palpit, dizzy/syncope; she has VI w/ periph edema... She was sent here for a pulm eval due to SOB/DOE w/ any activ and a 2DEcho done 02/14/15 in [place] showing norm LV size & function w/ EF=60-65% & norm wall motion, mild MR, mod LA&RA dil, norm RVF w/ severe pulmHTN (RVSP=80); prev 2DEcho 04/13/13 showed norm LVF w/ EF=55-60%, norm wall motion, no signif valve dis, mild-mod LAdil at 30m and PAsys=393mg; as far as causes for secondary pulmonaryHTN-- she has severe morbid obesity ([facility]), OSA on CPAP per her PCP, mild-mod restrictive lung dis likely related to her massive obesity, and she needs additional work up to rule out other contributing factors...       [facility] she is followed for Morbid Obesity & OSA on CPAP- pt reports a 3 yr hx OSA, diagnosed by DrPickard on a sleep study and treated by him w/ CPAP; the only avail Sleep [place] in [place] w/ AHI=25/hr (52/hr in REM), and RDI=29/hr (54/hr in REM), assoc w/ oxygen desat to 76%, mod snoring, no limb movements- events noted to be worse supine & during REM;  She was supposed to have a CPAP tiration study but I can't find it in Epic, neither  can I find download data or a subseq ONO... [facility]>   <01/11/15> c/o SOB (tight & heavy in her chest), cough, wheezing, felt to have an asthma exac- given Pred, AlbutHFA, nasonex...  <01/29/15> c/o persistent wheezing & SOB, plus water retention from the Pred, & she was given Pulmicort-2spBid...   <02/28/15> f/u visit & recent CXR said to show incr edema so they increased Lasix & referred to Cards- DrFitzgerald...      [place] Cardi[place]itz[place] DrAllred [place]> she has hx HBP, AFib first in 2004 ([facility] in sinus rhythm from 2007-2013 & then 6 different cardioversions 2013-2015); last note from DrAllred was 02/03/14- she had cardioversion 12/2013 w/ improved energy but then went back into her atypical AFlutter; at that visit she denied CP, palpit, SOB, and he reported no edema; since she failed several antiarrhythmic meds & DCCV, she was rec to consider another ablation w/ DrFitzgerald... Last note from DrFitzgerald in Epic was 02/01/15- f/u from another cardioversion, on Amio & dose decr from 400=>300 due to elev level, pt holding NSR...        EXAM showed Afeb, VSS, O2sat=97% on RA at rest; Wt=356#, 5'5"Tall, BMI=59;  HEENT- neg, mallampati2;  Chest- decr BS at bases, w/o w/r/r;  Heart- RR gr1/6 SEM w/o r/g;  Abd- obese, neg;  Ext- VI w/ 4+edema...  CT Angio chest 03/06/09 (done for SOB & AFib) showed no PE, mild cardiomeg, otherw neg as well...  Sleep Study in [place] was 03/25/12 at the [facility] [place], [place]- it showed severe OSA w/ AHI=25/hr (52/hr in REM), and RDI=29/hr (54/hr in REM), assoc w/ oxygen desat to 76%, mod snoring, no limb movements- events noted to be worse supine & during REM...  Lexiscan Myoview 04/08/12 showed norm EF=60%, norm wall motion, soft tissue attenuation & no ischemia...  TEE 12/29/12 showed decr LVF w/ EF=40-45%, diffuse HK, no vegetations, no LA clot, no PFO or R=>L shunt, norm  RV...  2DEcho 04/13/13 showed norm LVF w/ EF=55-60%, norm wall motion, no signif valve dis, mild-mod LAdil at 46m and PAsys=370mg...  PFT 12/16/13 showed FVC=2.16 (64%), FEV1=1.70 (66%), %1sec=79, mid-flows are reduced at 63% predicted; Lung Vols are mildly reduced (TLC at 73%); DLCO mildly reduced at 74% predicted (VA is similarly reduced)...   CPAP Titration Study 06/12/14>  CPAP titrated to 11cmH2O w/ AHI=6.3/hr (titration lim by pt's inability to maintain sleep); she used nasal pillows, mean O2sat=93.4%, NSR w/ PACs, no signif movements,   CXR 01/30/15 showed cardiomegaly & prominent central pulm vascularity, mild incr in interstitial markings, no effusion, NAD...  [facility] [place] showed norm LV size & function w/ EF=60-65% & norm wall motion, mild MR, mod LA&RA dil, norm RVF w/ severe pulmHTN (RVSP=80)...  Ambulatory oxygen saturation test 03/22/15> O2sat=97% on RA at rest w/ pulse=54/min; she walked only 1 lap in the office & stopped due to fatigue & SOB- lowest O2sat=86% w/ pulse=83/min...  LABS 5-03/2015:  Chems- ok x BS=128, Cr=1.21, A1c=6.1;  CBC- Hg=11.2, MCV=86;  BNP=146;  TSH=9.71 (FreeT3=2.4, FreeT4=1.02)... Note Amio level 01/26/15=3.5 (1.0-2.5).  IMP/PLAN>>  Heather [name] severe SOB/DOE w/ ADLs and recent 2DEcho in [place] showing severe incr PAsys=802m;  The likely etiology of secondary pulmHTN is her severe morbid obesity (BMI=59), severe OSA & hypoxemia (see above);  [facility] & Epic shows f/u visits for 75yr55yr none since 2010 & she clearly didn't loose any weight ([facility] and to consider further options- ?gastric sleeve?);  She indicates that DrPickard started CPAP (she doesn't know the settings) & I cannot find orders or download data in Epic (we will call [facility] data);  Ambulatory O2 sat here today dropped to 86% on RA after one lap- she needs O2 w/ exercise and needs an ONO as well once we see the download data  regarding compliance; consider ABG to r/o OHS... Improving her PA pressure is likely going to depend on appropriate CPAP management, relieving hypoxemia, and losing weight...   ADDENDUM>>  Started on Home oxygen at 2L/min [place] => 24/7 due to her severe pulmHTN ADDENDUM>> We received download data from [facility] w/ humidifier)- good compliance w/ CPAP: 90/90 days (Apr-Jun2016), 7-8hrs per night, CPAP=11, AHI on CPAP=1.5... AMarland KitchenMarland KitchenENDUM>>  ONO on CPAP, on O2 at 2L/min => pending (it was done 04/05/15 ON ROOM AIR w/ desat to 74%, <88% for 3hrs) ADDENDUM>>  04/12/15- [facility] [place]- note reviewed, wt=363#, BMI=59.5, they added 0.5cc to lap-band (total 5.0cc), reviewed diet/ nutrition consult etc & goal <300# or consider additional surg. ADDENDUM>>  04/17/15- ONO on O2 at 2L/min & CPAP showed she was still desaturating to <88% for 49.5min23mDesat events <88%=41 (Desat index of 11/hr); WE WILL  INCR NOCTURNAL O2 to 3L/min  ~  June 11, 2016:  46moROV & pulmonary [facility]>  After initial pulmonary consult 03/22/15 pt started on O2 and had the indicated CPAP download and ONO data but never returned to our office for recheck;  She was last increased to 3L/min added to her CPAP for the nocturnal hypoxemia and is now in need of another ONO to prove that this is adeq to keep her O2sats >90%, and she needs a 2DEcho to reassess her PAsys pressures... DrNewman is planning further Bariatiric surg in the near future & needs pulmonary surgical clearance...     She saw DrFitzgerald, Cards 06/03/16 & note reviewed in Care Everywhere>  HBP, PAF on Amio & s/p mult ablation procedures, OSA, PulmHTN- on Eliquis5Bid, Amio200, Metop25Bid, CardizemCD120, Losartan100, Lasix20-2Qam; she is holding NSR and cleared for bariatric surg by Cards;  NOTE-- Care Everywhere records indicate that she had a f/u 2DEcho [id number] the results are NOT avail & not referenced in any of  DrFitzgerald's cardiac notes (we need f/u 2DEcho to re-assess her est PAsys pressure while on adeq O2 supplementation...     From the pulmonary standpoint Heather [name] improved- less SOB but still w/ DOE after walking "long distances", she is again singing in the choir but needs her oxygen; she notes sl cough, denies sput production/ hemoptysis/ CP etc; We reviewed the following medical problems during today's office visit >>     Severe DYSPNEA>  She reports sl improved w/ wt loss so far & home oxygen therapy...    Nocturnal & exercise hypoxemia>  She desats to 86% on RA w/ min walking & signif O2 desats Qhs despite CPAP=> O2 incr to 3L/min bled into machine.    Pulmonary HTN>  2DEcho in [place] by DrFitagerald 5/16 indicated PAsys~80 (in 2014 it was ~34)...    OSA>  Initial sleep study by DrPickard & CPAP prescribed & followed by him; CPAP download from spring2016 showed AHI=1.5 on CPAP 11...    Morbid Obesity> Wt=356 lbs in Jun2016 & she is down to 311 lbs today; DFranki Monteis planning to convert her Lap band to Sleeve gastrectomy soon...    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    HBP>  On Metop25Bid, CardizenCD120, Losar100, Lasix40 & BP=128/74...    PAF>  On Eliquis5Bid & Amio200; she has had numerous ablation procedures; now holding NSR on the amio...    Ven Insuffic/ Edema>  On Lasix40 & the low salt diet...    Hypothyroid>  On Synthroid100 w/ TSH followed by DrPickard... EXAM shows pear-shaped body habitus, Afeb, VSS, O2sat=99% on RA at rest; Wt=311#, 5'5"Tall, BMI=52;  HEENT- neg, mallampati2;  Chest- decr BS at bases, w/o w/r/r;  Heart- RR gr1/6 SEM w/o r/g;  Abd- obese, neg;  Ext- VI w/ 1+edema...  Ambulatory Oximetry on 2L/min [place]>  O2sat=100% on 2L/min at rest w/ pulse=58/min;  She was able to ambulate just 1Lap (185') on the 2L O2 w/ lowest sat=100% w/ pulse=88/min... IMP/PLAN>>  We need to obtain an ONO on her 3L/min flow to prove  that she is no longer desaturating at night on her CPAP; it would also be instructive for a repeat 2DEcho to recheck her PAsys est pressure; she is OK for the surgery which we hope will be life saving given these life threatening complications from her morbid obesity...   ADDENDUM>>  06/30/16- ONO on O2 at  3L/min & her CPAP showed NO DESATURATIONS- all O2sat>90% on 3L/min SHE STILL NEEDS A FOLLOW UP 2DEcho TO RECHECK HER ESTIMATED PAsys PRESSURE and assess benefit from the O2  ~  January 29, 2017:  53moROV & pulmonary follow up visit>  CSoundrais here w/ her husb today- she tells me that her insurance would not approve the Bariatic Surg (gastric sleeve) since she had already had the LapBand & didn't follow up w/ DrNewman on that one;  [facility] but it was again refused & I registered my sincere disappointment as I viewed the Sleeve surg as life-saving for her w/ her morbid obesity, OSA and severe pulm hypertension;  She never followed up w/ uKoreaeither but has remained faithful to her CPAP and OXYGEN therapy;  Recently she noted an increase in DYSPNEA and had a follow up visit w/ [place] Cardiologist- [place]itzgerald [place]17 note reviewed)> HBP, PAF/ Flutter on Amio w/ mult prior ablation procedures, Morbid Obesity, OSA, & PulmHTN>  CXR showed incr interstitial markings suggesting CHF & her BNP was sl elev at 250;  Her Lasix was increased & she was sched for a f/u 2DEcho, the extra Lasix seemed to help the dyspnea;  DrFitsgerald stopped the Amio & placed her on Multaq, she remains on Eliquis as well, he wanted uKoreato recheck pt to see if she would benefit from Pred...     She notes 168mox incr SOB/DOE, notes she hasn't been able to walk as far or as long, then suddenly much worse w/ incr SOB w/ less activity; mild cough, occas mild sput production (no color, no blood), she denies CP, palpit, but notes sl incr edema as noted... She hasn't seen drPickard in ~1y47yrCXR 01/19/17 showed cardiomegaly,  bilat diffuse interstitial thickening (new from 01/2015)- no focal consolidation, effusion, etc;  Note> she went to see Ortho 10/2016 (DrDean & BlaNinfa Linden/ right knee pain & Sed elev at 88 & CRP was a sky-high 286; she prev received steroid shots 7 arthroscopic surg- was given Synvisc injection this time;  We discussed need for further eval & ILD work-up>>     EXAM shows pear-shaped body habitus, Afeb, VSS, O2sat=91% on RA at rest; Wt=303#, 5'5"Tall, BMI=51;  HEENT- neg, mallampati2;  Chest- decr BS at bases, w/o w/r/r;  Heart- RR gr1/6 SEM w/o r/g;  Abd- obese, neg;  Ext- VI w/ 1+edema...  LABS 10/31/16 by DrSDean, Ortho>  Sed=88, CRP=286  CPAP Download 3/14 - 01/22/17>  Excellent compliance- used 30/30 days for 8+hrs per night, CPAP set 11, min air leak, AHI=1.3 per hr... Continue same.  CXR 01/19/17>  Cardiomegaly, bilat diffuse interstitial thickening (new from 01/2015)- no focal consolidation, effusion, etc...  LABS 01/2017>  Chems- wnl w/ BS=105, Cr=1.03, BNP=250=>158 (after incr Lasix dose);  CBC- ok w/ borderline Hg=12.5, mcv=87;  TSH=5.37 on Levothy50 (rec to inc to 41m2m);    Collagen-Vasc screen 01/2017>  Quantiferon Gold= INDETERM;  Sed=122;  RA factor= NEG (<14);  Anti-CCP= NEG (<16);  ANA=neg;  ANCA=NEG;  ACE=76 (9-67);  Sjogrens SSA (Ro) & SSB (La)= NEG;  Scleroderma Scl-70= NEG;  CPK= 47 (7-177);  RNP= +6.5 (nl<1.0) => Pending:  dsDNA antibodies (=neg) and Sm Antibodies (=neg). Therefore no serologic evid for Lupus, Scleroderma, Sjogrens, or MCTD...  2DEcho 02/03/17> norm LV w/ EF=60-65% & no regional wall motion abn, +Gr2DD, Ao root is wnl, AoV ok w/ mild AI, MV ok w/ trivMR, LA normal, RV normal, RA mod  dil & PAsys~60mHg  Hi-res CT Chest 02/09/17> cardiomeg, Ao atherosclerosis, ectatic ascending Ao ~4cm & no ch from 2010, dilated main PA at 3.8cm (prev 3.4);  Few mildly enlarged mediastinal nodes (largest1.5cm right paratrach area);  Numerous poorly marginated subsolid nodules bilaterally  (most prom in LL's- eg 1.2cm RLL & 1.0cm LLL); note- no subpleural reticulation, traction bronchiectasis, parenchymal banding, architectural distortion or honeycombing); mild interlobar septal thickening (suggesting mild pulm edema) +thoracic spndylosis... Overall pattern suggesting infections vs atypic inflamm etiologies like COP (cryptogenic organizing pneumonitis)...  IMP/PLAN>>  CAlandahas a complex medical picture w/ underlying Morbid Obesity, Restrictive pulm dis, chronic hypoxemic resp failure on O2, & OSA on CPAP=> causing secondary pulm HTN (Prob WHO Group 3);  On effective CPAP therapy & 24/7 O2 w/ resolution of her hypoxemia- her PA pressure has improved from ~80 (02/2015) to ~65 at present, unfortunately she was turned down by her insurance company for further Bariatric surg as I still believe that a successful gastric sleeve procedure could prove life saving!         Now she presents w/ a superimposed acute dyspnea w/ CXR abn thought to suggest early CHF & a BNP=250 + 2DEcho w/ good LVF but Gr2DD, placed on Lasix w/ only min improvement;  Note recent Ortho eval w/ very high inflamm markers- treated w/ synvisc;  Further Pulm eval reveals Hi-res CT Chest suggesting COP & collagen vasc screen w/ a few unexplained random marker abn (eg- neg ANA & ANCA but pos RNP, plus the ACE level is sl elev & inflamm markers remain very high (sed=122);  She was placed on empiric trial of PRED pending some of our studies and she's had a good clinical response w/ improvement in her breathing (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  We will have her ret in 127moo assess response & recheck labs.      Note that CARDS- DrFitzgerald has stopped her Amio in favor of Multaq for her PAF & she continues to hold NSR... Note: >50% of this 6037mappt was spent in counseling & coordination of care...  ~  Mar 02, 2017:  19mo88mo & pulm follow up visit> last visit Heather [name] started on Pred due to serology showing elev Sed &  ACE, plus hi-res CT c/w COP;  She reports much improved on the Pred 20mg81m x 2wks then 20mg/16ml now;  Despite counseling re appetite & oral intake- she has gained 14# to 316# today!  She is unable to exercise due to her right knee- she has seen DrCBlackman, Ortho & he did arthrocentesis & injection, tried Synvisc, tried Xylocaine & Depomedrol...     EXAM shows pear-shaped body habitus, Afeb, VSS, O2sat=97% on O2 at 2L/min pulse; Wt=316#, 5'5"Tall, BMI=52;  HEENT- neg, mallampati2;  Chest- decr BS at bases, w/o w/r/r;  Heart- RR gr1/6 SEM w/o r/g;  Abd- obese, neg;  Ext- VI w/ 1+edema...  CXR 03/02/17 (independently reviewed by me in the PACS system) showed cardiomegaly, clear lungs/ NAD- no vasc congestion, post surg changes in LUQ, mild DDD in Tspine...  LABS 03/02/17>  Sed improved down to 28, and ACE improved down to 25 on the Pred; RNP antibody is still pos at 6.9 (no change) IMP/PLAN>>  CynthiAdrinaproved- breathing better on the Pred & inflamm markers are much better;  This favors the Dx of COP & we will continue to wean the Pred from 20mg/d57m to 20-10 qod for 3wks and then 10mg/d 19m  return in 6wks;  She is admonished to get on a vigorous low carb, low fat diet & get her weight down, again I am in favor of Bariatric surg as a life-saving procedure for her- she still has severe secondary pulmHTN on treatment OSA, morbid obesity, restrictive pulm dis...    ~  April 27, 2017:  8moROV & after her last visit CGysellecut her Pred for what appears to be COP from 291md w/ +- clear CXR & ACE down to 25 & Sed=28, down to 20-10 Qod for 3wks then 1062m;  She called w/ worsening symptoms & we bumped her back up to 20-10Qod w/ f/u today;  She reports still not feeling well & notices some "shakes" (no tremor on exam);  She notes she can't exercise due to her knee arthritis and her breathing;      She has a complex picture w/ underlying restrictive lung dis, morbid obesity, OSA & pulmHTN- on CPAP, O2, and  attempts at diet & exercise, she is still considering further bariatric surg from [place]Then she developed superimposed ILD w/ abn CXR & hi-res CT showing c/w COP, inflamm markers were thru the roof w/ Sed=122, an elev ACE & pos RNP-Ab (all of which improved on PRED rx);  CXR improved & we were weaning the Pred when she had this set-back...     She remains on CPAP Qhs & reports doing well, resting satis, denies daytime hy[persomnolence...     EXAM shows pear-shaped body habitus, Afeb, VSS, O2sat=95% on O2 at 2L/min pulse; Wt=318#, 5'5"Tall, BMI=52;  HEENT- neg, mallampati2;  Chest- decr BS at bases, w/o w/r/r;  Heart- RR gr1/6 SEM w/o r/g;  Abd- obese, neg;  Ext- VI w/ 1+edema...  LABS 04/27/17>  Sed=34 & BNP=201 (she has Gr2DD on 2DEcho & we decided to incr the Lasix40 to 43m61mm, keep the Pred at 20-10 Qod... IMP/PLAN>>  We decided to continue the Pred at 20-10 Qod & incr her Lasix from 40mg48mo 43mg 44mw/ ROV recheck in 92month83month  May 28, 2017:  1 mo ROV & CynthiaPaytonintained Pred at 20mg al64m 10mg Qod50me increased her Lasix from 40mg/d to71mg/d for66m last month;  She reports that she is much improved at present on these doses & we are checking f/u blood work today...  We reviewed the following medical problems during today's office visit >>     Severe DYSPNEA>  She reports sl improved overall on Pred taper & home oxygen therapy...    Hx cryptogenic organizing pneumonia>  She presented w/ incr dyspnea 4/18 & had some interstitial changes on CXR; Hi-res CT Chest favored COP & we started Pred w/ nice response both symptomatically & by XRay w/ decOdette Hornsd rate to normal & resolution of the interstial changes;  She is now on Pred 20-10 Qod & Lasix increased to 43mgQam... 22mocturnal & exercise hypoxemia>  She desats to 86% on RA w/ min walking & signif O2 desats Qhs despite CPAP=> O2 incr to 3L/min bled into machine & f/u ONO confirms good response w/ resolution of nocturnal  hypoxemia.    Pulmonary HTN>  2DEcho in [place] by DrFitagerald 5/16 indicated PAsys~80 (in 2014 it was ~34); on CPAP, Meds, diet (no wt loss however)- f/u 2DEcho 4/18 showed PAsys~65mmHg    OS10mInitial sleep study by DrPickard & CPAP prescribed & followed by him; CPAP download from spring2016 showed AHI=1.5  on CPAP 11;  Similar results on CPAP download 4/18...    Morbid Obesity> Wt=356 lbs in Jun2016 & she is around 320 lbs today; Franki Monte is planning to convert her Lap band to Sleeve gastrectomy soon...    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    HBP>  On Metop25Bid, CardizenCD120, Losar100, Lasix80 & BP=128/74...    PAF>  On Eliquis5Bid & Multaq400Bid; she has had numerous ablation procedures; now holding NSR so far...    Ven Insuffic/ Edema>  On Lasix80/d for the last month & the edema is diminished;  She knows to follow a strict low salt diet...    Hypothyroid>  On Synthroid75 now w/ prev TSH 01/2017 = 5.37 on Synthroid50 at the time (therefore dose increased slightly)... EXAM shows pear-shaped body habitus, Afeb, VSS, O2sat=98% on O2 at 2L/min pulse; Wt=321#, 5'5"Tall, BMI=52;  HEENT- neg, mallampati2;  Chest- decr BS at bases, w/o w/r/r;  Heart- RR gr1/6 SEM w/o r/g;  Abd- obese, neg;  Ext- VI w/ tr edema...  LABS 05/28/17>  Chems- ok w/ BS=103, BUN=38, Cr=1.41;  BNP=166;  Sed=26... IMP/PLAN>>  Heather [name] indicates to me that she is hoping to get her sleeve gastrectomy surg in Oct/Nov and needs to be on a low dose of Pred ~45m/d or less for this to happen;  She is improved today w/ the Pred 20-10 qod & Lasix80/d => we decided to cut the Pred to 144md and keep the Lasix at 80Qam for now w/ rov 61m106mor recheck leading up to her bariatric surg...    Past Medical History:  Diagnosis Date  . Anemia    "onc;e; had to take iron for awhile"  . Atrial fibrillation (HCCHumphrey  Began in 2004.  [facility] and [facility].  She is  off coumadin  now with no noted recurrent atrial fibrillation since 2007. til 04/01/12; s/p initiation of Rhythmol with DCCV June 2013  . Carpal tunnel syndrome   . Degenerative disk disease   . GERD (gastroesophageal reflux disease)    resolved after lap band  . History of blood transfusion    "w/both hip replacements"  . HTN (hypertension)   . Hypothyroidism   . IBS (irritable bowel syndrome)   . Interstitial cystitis   . Morton's neuroma    right foot  . Obesity   . On home oxygen therapy   . Pulmonary hypertension (HCCChappell . Shortness of breath    only without oxygen  . Sleep apnea 04/01/12   "dx'd just last week"  . Sleep apnea    uses CPAP, 11 CWP with residual AHI 6.3    Past Surgical History:  Procedure Laterality Date  . ANTERIOR AND POSTERIOR REPAIR  10/20/2012   Procedure: ANTERIOR (CYSTOCELE) AND POSTERIOR REPAIR (RECTOCELE);  Surgeon: SanAlwyn PeaD;  [facility];  Service: Gynecology;  Laterality: N/A;  2 hours  . ATRIAL FIBRILLATION ABLATION N/A 12/30/2012   Procedure: ATRIAL FIBRILLATION ABLATION;  Surgeon: JamThompson GrayerD;  [facility];  Service: Cardiovascular;  Laterality: N/A;  . BACK SURGERY    . CARDIAC CATHETERIZATION    . CARDIAC ELECTROPHYSIOLOGY MAPPING AND ABLATION  ~ 2005 and 2007   "[facility]ERS[facility]  04/02/2012   Procedure: CARDIOVERSION;  Surgeon: DalLarey DresserD;  [facility] [place];  Laterality: N/A;  . CARDIOVERSION  06/16/2012   Procedure: CARDIOVERSION;  Surgeon: Thayer Headings, MD;  Location: [place];  Service: Cardiovascular;  Laterality: N/A;  amanda/ebp/Beverly( or scheduling)  . CARDIOVERSION  10/29/2012   Procedure: CARDIOVERSION;  Surgeon: Larey Dresser, MD;  Location[facility];  Service: Cardiovascular;  Laterality: N/A;  . CARDIOVERSION N/A 03/30/2013   Procedure: CARDIOVERSION;  Surgeon: Larey Dresser, MD;  Location: [place];   Service: Cardiovascular;  Laterality: N/A;  . CARDIOVERSION N/A 12/23/2013   Procedure: CARDIOVERSION;  Surgeon: Dorothy Spark, MD;  Location: [place];  Service: Cardiovascular;  Laterality: N/A;  9:27 Propofol 5m, IV   150 joules synched shock by Dr. NMeda Coffee@ 150 joules...SR   post 12 lead ordered.   . CARPAL TUNNEL RELEASE  1990's   bilaterally  . DILATION AND CURETTAGE OF UTERUS  2000  . JOINT REPLACEMENT     bilateral hips  . KNEE ARTHROSCOPY Right 11/18/2016   Procedure: RIGHT KNEE ARTHROSCOPY WITH DEBRIDEMENT;  Surgeon: CMcarthur Rossetti MD;  [place] [facility]  Service: Orthopedics;  Laterality: Right;  . LAPAROSCOPIC GASTRIC BANDING  2009  . LASIK    . POSTERIOR FUSION LUMBAR SPINE  2000's   "nerve problems"  . POSTERIOR FUSION LUMBAR SPINE  2000's  . TEE WITHOUT CARDIOVERSION N/A 12/29/2012   Procedure: TRANSESOPHAGEAL ECHOCARDIOGRAM (TEE);  Surgeon: Peter M JMartinique MD;  [place] [facility]  Service: Cardiovascular;  Laterality: N/A;  . [place] . TOTAL HIP ARTHROPLASTY  ~2009; 2010   right; left  . TUBAL LIGATION  1980  . VAGINAL HYSTERECTOMY  2000    Outpatient Encounter Prescriptions as of 05/28/2017  Medication Sig  . acetaminophen-codeine (TYLENOL #3) 300-30 MG tablet Take 1 tablet by mouth every 6 (six) hours as needed. for pain  . amitriptyline (ELAVIL) 10 MG tablet Take 1 tablet (10 mg total) by mouth at bedtime.  . cetirizine (ZYRTEC) 10 MG tablet Take 10 mg by mouth daily.  . Cholecalciferol (VITAMIN D) 2000 UNITS tablet Take 2,000 Units by mouth daily.  . clonazePAM (KLONOPIN) 0.5 MG tablet Take 1/2 to 1 tablet by mouth two times daily  . diclofenac sodium (VOLTAREN) 1 % GEL Apply 2 g topically 4 (four) times daily.  .Marland Kitchendiltiazem (CARDIZEM CD) 120 MG 24 hr capsule Take 1275mby mouth daily  . dronedarone (MULTAQ) 400 MG tablet Take 400 mg by mouth 2 (two) times daily with a meal.  . ELIQUIS 5 MG TABS tablet TAKE 1 TABLET  BY MOUTH TWICE A DAY....NEED OFFICE VISIT BEFORE ANY MORE REFILLS  . furosemide (LASIX) 40 MG tablet Take 80 mg by mouth daily.   . Marland KitchenuaiFENesin (MUCINEX) 600 MG 12 hr tablet Take 600 mg by mouth 2 (two) times daily as needed. For allergies.  . Marland Kitchenevothyroxine (SYNTHROID, LEVOTHROID) 75 MCG tablet Take 1 tablet (75 mcg total) by mouth daily before breakfast.  . losartan (COZAAR) 100 MG tablet TAKE 1 TABLET BY MOUTH  DAILY  . Magnesium 250 MG TABS Take 250 mg by mouth 2 (two) times daily.  . meloxicam (MOBIC) 15 MG tablet Take 1 tablet (15 mg total) by mouth daily.  . metoprolol tartrate (LOPRESSOR) 25 MG tablet Take 25 mg by mouth 2 (two) times daily.   . predniSONE (DELTASONE) 20 MG tablet TAKE AS DIRECTED - on 2067m0mg QOD  .  levothyroxine (SYNTHROID, LEVOTHROID) 75 MCG tablet Take 1 tablet (75 mcg total) by mouth daily before breakfast.   No facility-administered encounter medications  on file as of 05/28/2017.     Allergies  Allergen Reactions  . Cephalexin Hives and Other (See Comments)    "throat started closing up"  . Rocephin [Ceftriaxone Sodium In Dextrose] Anaphylaxis  . Penicillins Rash    Has patient had a PCN reaction causing immediate rash, facial/tongue/throat swelling, SOB or lightheadedness with hypotension: no Has patient had a PCN reaction causing severe rash involving mucus membranes or skin necrosis: no Has patient had a PCN reaction that required hospitalization no Has patient had a PCN reaction occurring within the last 10 years: no If all of the above answers are "NO", then may proceed with Cephalosporin use.  . Flecainide Acetate Other (See Comments)    "couldn't take it"  . Moxifloxacin     Does not remember the reaction  . Sulfonamide Derivatives     Does not remember reaction ; "was so young when I had reaction to it"    Immunization History  Administered Date(s) Administered  . Influenza Split 07/13/2016  . Influenza-Unspecified 11/04/2012, 08/24/2014  .  Zoster 03/24/2013    Current Medications, Allergies, Past Medical History, Past Surgical History, Family History, and Social History were reviewed in [employer] record.   Review of Systems            All symptoms NEG except where BOLDED >>  Constitutional:  F/C/S, fatigue, anorexia, unexpected weight change. HEENT:  HA, visual changes, hearing loss, earache, nasal symptoms, sore throat, mouth sores, hoarseness. Resp:  cough, sputum, hemoptysis; SOB, tightness, wheezing. Cardio:  CP, palpit, DOE, orthopnea, edema. GI:  N/V/D/C, blood in stool; reflux, abd pain, distention, gas. GU:  dysuria, freq, urgency, hematuria, flank pain, voiding difficulty. MS:  joint pain, swelling, tenderness, decr ROM; neck pain, back pain, etc. Neuro:  HA, tremors, seizures, dizziness, syncope, weakness, numbness, gait abn. Skin:  suspicious lesions or skin rash. Heme:  adenopathy, bruising, bleeding. Psyche:  confusion, agitation, sleep disturbance, hallucinations, anxiety, depression suicidal.   Objective:   Physical Exam      Vital Signs:  Reviewed...   General:  WD, morbidly obese, 66 y/o WF in NAD; alert & oriented; pleasant & cooperative... HEENT:  [place]/AT; Conjunctiva- pink, Sclera- nonicteric, EOM-wnl, PERRLA, EACs-clear, TMs-wnl; NOSE-clear; THROAT-clear & wnl.  Neck:  Supple w/ decr ROM; no JVD; normal carotid impulses w/o bruits; no thyromegaly or nodules palpated; no lymphadenopathy.  Chest:  decr BS bilat at bases, but clear without wheezes, rales, or rhonchi heard. Heart:  Regular Rhythm; gr1/6SEM without rubs or gallops detected. Abdomen:  Obese, soft & nontender- no guarding or rebound; normal bowel sounds; no organomegaly or masses palpated. Ext:  decrROM; +arthritic changes; no varicose veins, +venous insuffic, 2+ edema;  Pulses intact w/o bruits. Neuro:  No focal neuro deficits, +gait abnormality & balance fair... Derm:  No lesions noted; no rash etc. Lymph:  No  cervical, supraclavicular, axillary, or inguinal adenopathy palpated.   Assessment:      IMP >>     Severe DYSPNEA>  She reports sl improved w/ wt loss so far & home oxygen therapy...    Nocturnal & exercise hypoxemia>  She desats to 86% on RA w/ min walking & signif O2 desats Qhs despite CPAP=> O2 incr to 3L/min bled into machine.    Pulmonary HTN>  2DEcho in [place] by DrFitagerald 5/16 indicated PAsys~80 (in 2014 it was ~34)...    OSA>  Initial sleep study by DrPickard & CPAP prescribed & followed by him; CPAP download from spring2016  showed AHI=1.5 on CPAP 11...    Morbid Obesity> Wt=356 lbs in Jun2016 & she is down to 311 lbs today; Franki Monte is planning to convert her Lap band to Sleeve gastrectomy soon...    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    HBP>  On Metop25Bid, CardizenCD120, Losar100, Lasix40 & BP=128/74...    PAF>  On Eliquis5Bid & Amio200; she has had numerous ablation procedures; now holding NSR on the amio...    Ven Insuffic/ Edema>  On Lasix40 & the low salt diet...    Hypothyroid>  On Synthroid100 w/ TSH followed by DrPickard...  PLAN >>  03/22/15>   Heather [name] has severe SOB/DOE w/ ADLs and recent 2DEcho in [place] showing severe incr PAsys=64mHg;  The likely etiology of secondary pulmHTN is her severe morbid obesity (BMI=59), severe OSA & hypoxemia (see above);  [facility] & Epic shows f/u visits for 152yrut none since 2010 & she clearly didn't loose any weight ([facility] and to consider further options- ?gastric sleeve?);  She indicates that DrPickard started CPAP (she doesn't know the settings) & I cannot find orders or download data in Epic (we will call AHW[place]buryor data);  Ambulatory O2 sat here today dropped to 86% on RA after one lap- she needs O2 w/ exercise and needs an ONO as well once we see the download data regarding compliance; consider ABG to r/o OHS... Improving her  PA pressure is likely going to depend on appropriate CPAP management, relieving hypoxemia, and losing weight. 06/11/16>   We need to obtain an ONO on her 3L/min flow to prove that she is no longer desaturating at night on her CPAP; it would also be instructive for a repeat 2DEcho to recheck her PAsys est pressure; she is OK for the surgery which we hope will be life saving given these life threatening complications from her morbid obesity.   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       Now she presents w/ a superimposed acute dyspnea w/ CXR abn thought to suggest early CHF & a BNP=250 + 2DEcho w/ good LVF but Gr2DD, placed on Lasix w/ only min improvement;  Note recent Ortho eval w/ very high inflamm markers- treated w/ synvisc;  Further Pulm eval reveals Hi-res CT Chest suggesting COP & collagen vasc screen w/ a few unexplained random marker abn (eg- neg ANA & ANCA but pos RNP, plus the ACE level is sl elev & inflamm markers remain very high (sed=122);  She was placed on empiric trial of PRED pending some of our studies and she's had a good clinical response w/ improvement in her breathing (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  We will have her ret in 78m10mo assess response & recheck labs.  03/02/17>   Heather [name] improved- breathing better on the Pred & inflamm markers are much better;  This favors the Dx of COP & we will continue to wean the  Pred from 39m/d now to 20-10 qod for 3wks and then 149md til return in 6wks;  She is admonished to  get on a vigorous low carb, low fat diet & get her weight down, again I am in favor of Bariatric surg as a life-saving procedure for her- she still has severe secondary pulmHTN on treatment OSA, morbid obesity, restrictive pulm dis. 04/27/17>   We decided to continue the Pred at 20-10 Qod & incr her Lasix from 4017m to 33m16mm w/ ROV recheck in 69mon9month6/18>   CynthPaitoncates to me that she is hoping to get her sleeve gastrectomy surg in Oct/Nov and needs to be on a low dose of Pred ~5mg/d73m less for this to happen;  She is improved today w/ the Pred 20-10 qod & Lasix80/d => we decided to cut the Pred to 10mg/d50m keep the Lasix at 80Qam for now w/ rov 69mo for72moheck leading up to her bariatric surg.   Plan:     Patient's Medications  New Prescriptions   No medications on file  Previous Medications        OXYGEN >>      2-3L/min by nasal cannula or bled into CPAP at night...   ACETAMINOPHEN-CODEINE (TYLENOL #3) 300-30 MG TABLET    Take 1 tablet by mouth every 6 (six) hours as needed. for pain   AMITRIPTYLINE (ELAVIL) 10 MG TABLET    Take 1 tablet (10 mg total) by mouth at bedtime.   CETIRIZINE (ZYRTEC) 10 MG TABLET    Take 10 mg by mouth daily.   CHOLECALCIFEROL (VITAMIN D) 2000 UNITS TABLET    Take 2,000 Units by mouth daily.   CLONAZEPAM (KLONOPIN) 0.5 MG TABLET    Take 1/2 to 1 tablet by mouth two times daily   DICLOFENAC SODIUM (VOLTAREN) 1 % GEL    Apply 2 g topically 4 (four) times daily.   DILTIAZEM (CARDIZEM CD) 120 MG 24 HR CAPSULE    Take 120mg by 26mh daily   DRONEDARONE (MULTAQ) 400 MG TABLET    Take 400 mg by mouth 2 (two) times daily with a meal.   ELIQUIS 5 MG TABS TABLET    TAKE 1 TABLET BY MOUTH TWICE A DAY....NEED OFFICE VISIT BEFORE ANY MORE REFILLS   FUROSEMIDE (LASIX) 40 MG TABLET    Take 80 mg by mouth daily.    GUAIFENESIN (MUCINEX) 600 MG 12 HR TABLET    Take 600 mg by mouth 2 (two) times daily as needed. For allergies.   LOSARTAN (COZAAR) 100 MG TABLET     TAKE 1 TABLET BY MOUTH  DAILY   MAGNESIUM 250 MG TABS    Take 250 mg by mouth 2 (two) times daily.   MELOXICAM (MOBIC) 15 MG TABLET    Take 1 tablet (15 mg total) by mouth daily.   METOPROLOL TARTRATE (LOPRESSOR) 25 MG TABLET    Take 25 mg by mouth 2 (two) times daily.    PREDNISONE (DELTASONE) 20 MG TABLET    TAKE AS DIRECTED -- DECREASED to 10mg dail40modified Medications   Modified Medication Previous Medication   LEVOTHYROXINE (SYNTHROID, LEVOTHROID) 75 MCG TABLET levothyroxine (SYNTHROID, LEVOTHROID) 75 MCG tablet      Take 1 tablet (75 mcg total) by mouth daily before breakfast.    Take 1 tablet (75 mcg total) by mouth daily before breakfast.  Discontinued Medications   No medications on file

## 2017-06-19 ENCOUNTER — Telehealth: Payer: Self-pay | Admitting: Pulmonary Disease

## 2017-06-19 DIAGNOSIS — G4733 Obstructive sleep apnea (adult) (pediatric): Secondary | ICD-10-CM

## 2017-06-19 NOTE — Telephone Encounter (Signed)
Called and spoke with pt and she stated that at her last ov SN told her that she will need a new cpap machine since hers is over 65 years old and she has to bring the entire machine in for a DL.  Order has been placed and the pt is aware.

## 2017-06-23 ENCOUNTER — Telehealth: Payer: Self-pay | Admitting: Pulmonary Disease

## 2017-06-23 DIAGNOSIS — G4733 Obstructive sleep apnea (adult) (pediatric): Secondary | ICD-10-CM

## 2017-06-23 NOTE — Telephone Encounter (Signed)
Order has been updated. [name] is aware. Nothing further was needed.

## 2017-06-30 ENCOUNTER — Ambulatory Visit (INDEPENDENT_AMBULATORY_CARE_PROVIDER_SITE_OTHER): Payer: Medicare Other | Admitting: Pulmonary Disease

## 2017-06-30 ENCOUNTER — Other Ambulatory Visit (INDEPENDENT_AMBULATORY_CARE_PROVIDER_SITE_OTHER): Payer: Self-pay

## 2017-06-30 ENCOUNTER — Encounter: Payer: Self-pay | Admitting: Pulmonary Disease

## 2017-06-30 VITALS — BP 130/72 | HR 55 | Temp 97.5°F | Ht 65.0 in | Wt 327.1 lb

## 2017-06-30 DIAGNOSIS — I5032 Chronic diastolic (congestive) heart failure: Secondary | ICD-10-CM | POA: Diagnosis not present

## 2017-06-30 DIAGNOSIS — Z23 Encounter for immunization: Secondary | ICD-10-CM

## 2017-06-30 DIAGNOSIS — G4733 Obstructive sleep apnea (adult) (pediatric): Secondary | ICD-10-CM

## 2017-06-30 DIAGNOSIS — J849 Interstitial pulmonary disease, unspecified: Secondary | ICD-10-CM

## 2017-06-30 DIAGNOSIS — I480 Paroxysmal atrial fibrillation: Secondary | ICD-10-CM | POA: Diagnosis not present

## 2017-06-30 DIAGNOSIS — I10 Essential (primary) hypertension: Secondary | ICD-10-CM | POA: Diagnosis not present

## 2017-06-30 DIAGNOSIS — R609 Edema, unspecified: Secondary | ICD-10-CM

## 2017-06-30 DIAGNOSIS — Z9884 Bariatric surgery status: Secondary | ICD-10-CM

## 2017-06-30 DIAGNOSIS — I872 Venous insufficiency (chronic) (peripheral): Secondary | ICD-10-CM

## 2017-06-30 DIAGNOSIS — Z7901 Long term (current) use of anticoagulants: Secondary | ICD-10-CM

## 2017-06-30 DIAGNOSIS — I272 Pulmonary hypertension, unspecified: Secondary | ICD-10-CM

## 2017-06-30 LAB — COMPREHENSIVE METABOLIC PANEL
ALBUMIN: 4.3 g/dL (ref 3.5–5.2)
ALK PHOS: 89 U/L (ref 39–117)
ALT: 22 U/L (ref 0–35)
AST: 14 U/L (ref 0–37)
BILIRUBIN TOTAL: 0.6 mg/dL (ref 0.2–1.2)
BUN: 39 mg/dL — ABNORMAL HIGH (ref 6–23)
CALCIUM: 9.6 mg/dL (ref 8.4–10.5)
CHLORIDE: 101 meq/L (ref 96–112)
CO2: 30 mEq/L (ref 19–32)
Creatinine, Ser: 1.24 mg/dL — ABNORMAL HIGH (ref 0.40–1.20)
GFR: 46.14 mL/min — ABNORMAL LOW (ref >60.00)
Glucose, Bld: 116 mg/dL — ABNORMAL HIGH (ref 70–99)
Potassium: 3.7 mEq/L (ref 3.5–5.1)
Sodium: 143 mEq/L (ref 135–145)
TOTAL PROTEIN: 7.4 g/dL (ref 6.0–8.3)

## 2017-06-30 LAB — SEDIMENTATION RATE: SED RATE: 35 mm/h — AB (ref 0–30)

## 2017-06-30 NOTE — Progress Notes (Addendum)
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 ATRIAL FIBRILLATION ABLATION N/A 12/30/2012   Procedure: ATRIAL FIBRILLATION ABLATION;  Surgeon: Thompson Grayer, MD;  Location: [facility] CATH LAB;  Service: Cardiovascular;  Laterality: N/A;  . BACK SURGERY    . CARDIAC CATHETERIZATION    . CARDIAC ELECTROPHYSIOLOGY MAPPING AND ABLATION  ~ 2005 and 2007   "did more 2nd time; both at [facility]"  . CARDIOVERSION  04/02/2012   Procedure: CARDIOVERSION;  Surgeon: Larey Dresser, MD;  Location: [place];  Service: Cardiovascular;  Laterality: N/A;  . CARDIOVERSION  06/16/2012   Procedure: CARDIOVERSION;  Surgeon: Thayer Headings, MD;  Location: [place];  Service: Cardiovascular;  Laterality: N/A;  amanda/ebp/[place]( or scheduling)  . CARDIOVERSION  10/29/2012   Procedure: CARDIOVERSION;  Surgeon: Larey Dresser, MD;  Location: [facility] ENDOSCOPY;  Service: Cardiovascular;  Laterality: N/A;  . CARDIOVERSION N/A 03/30/2013   Procedure: CARDIOVERSION;  Surgeon: Larey Dresser, MD;   Location: [place];  Service: Cardiovascular;  Laterality: N/A;  . CARDIOVERSION N/A 12/23/2013   Procedure: CARDIOVERSION;  Surgeon: Dorothy Spark, MD;  Location: [place];  Service: Cardiovascular;  Laterality: N/A;  9:27 Propofol 75m, IV   150 joules synched shock by Dr. NMeda Coffee@ 150 joules...SR   post 12 lead ordered.   . CARPAL TUNNEL RELEASE  1990's   bilaterally  . DILATION AND CURETTAGE OF UTERUS  2000  . JOINT REPLACEMENT     bilateral hips  . KNEE ARTHROSCOPY Right 11/18/2016   Procedure: RIGHT KNEE ARTHROSCOPY WITH DEBRIDEMENT;  Surgeon: CMcarthur Rossetti MD;  [place]  Service: Orthopedics;  Laterality: Right;  . LAPAROSCOPIC GASTRIC BANDING  2009  . LASIK    . POSTERIOR FUSION LUMBAR SPINE  2000's   "nerve problems"  . POSTERIOR FUSION LUMBAR SPINE  2000's  . TEE WITHOUT CARDIOVERSION N/A 12/29/2012   Procedure: TRANSESOPHAGEAL ECHOCARDIOGRAM (TEE);  Surgeon:  Peter M Martinique, MD;  Location: [place];  Service: Cardiovascular;  Laterality: N/A;  . [place]  . TOTAL HIP ARTHROPLASTY  ~2009; 2010   right; left  . TUBAL LIGATION  1980  . VAGINAL HYSTERECTOMY  2000    Outpatient Encounter Prescriptions as of 06/30/2017  Medication Sig  . acetaminophen-codeine (TYLENOL #3) 300-30 MG tablet Take 1 tablet by mouth every 6 (six) hours as needed. for pain  . amitriptyline (ELAVIL) 10 MG tablet Take 1 tablet (10 mg total) by mouth at bedtime.  . cetirizine (ZYRTEC) 10 MG tablet Take 10 mg by mouth daily.  . Cholecalciferol (VITAMIN D) 2000 UNITS tablet Take 2,000 Units by mouth daily.  . clonazePAM (KLONOPIN) 0.5 MG tablet Take 1/2 to 1 tablet by mouth two times daily  . diclofenac sodium (VOLTAREN) 1 % GEL Apply 2 g topically 4 (four) times daily.  Marland Kitchen diltiazem (CARDIZEM CD) 120 MG 24 hr capsule Take 112m by mouth daily  . dronedarone (MULTAQ) 400 MG tablet Take 400 mg by mouth 2 (two) times daily with a meal.  . ELIQUIS 5 MG  TABS tablet TAKE 1 TABLET BY MOUTH TWICE A DAY....NEED OFFICE VISIT BEFORE ANY MORE REFILLS  . furosemide (LASIX) 40 MG tablet Take 80 mg by mouth daily.   .Marland KitchenguaiFENesin (MUCINEX) 600 MG 12 hr tablet Take 600 mg by mouth 2 (two) times daily as needed. For allergies.  .Marland Kitchenlevothyroxine (SYNTHROID, LEVOTHROID) 75 MCG tablet Take 1 tablet (75 mcg total) by mouth daily before breakfast.  . losartan (COZAAR) 100 MG tablet TAKE 1 TABLET BY MOUTH  DAILY  . Magnesium 250 MG TABS Take 250 mg by mouth 2 (two) times daily.  . meloxicam (MOBIC) 15 MG tablet Take 1 tablet (15 mg total) by mouth daily.  . metoprolol tartrate (LOPRESSOR) 25 MG tablet Take 25 mg by mouth 2 (two) times daily.   . predniSONE (DELTASONE) 20 MG tablet TAKE AS DIRECTED   No facility-administered encounter medications on file as of 06/30/2017.     Allergies  Allergen Reactions  . Cephalexin Hives and Other (See Comments)    "throat started closing up"  . Rocephin [Ceftriaxone Sodium In Dextrose] Anaphylaxis  . Penicillins Rash    Has patient had a PCN reaction causing immediate rash, facial/tongue/throat swelling, SOB or lightheadedness with hypotension: no Has patient had a PCN reaction causing severe rash involving mucus membranes or skin necrosis: no Has patient had a PCN reaction that required hospitalization no Has patient had a PCN reaction occurring within the last 10 years: no If all of the above answers are "NO", then may proceed with Cephalosporin use.  . Flecainide Acetate Other (See Comments)    "couldn't take it"  . Moxifloxacin     Does not remember the reaction  . Sulfonamide Derivatives     Does not remember reaction ; "was so young when I had reaction to it"    Immunization History  Administered Date(s) Administered  . Influenza Split 07/13/2016  . Influenza,inj,Quad PF,6+ Mos 06/30/2017  . Influenza-Unspecified 11/04/2012, 08/24/2014  . Zoster 03/24/2013    Current Medications, Allergies, Past  Medical History, Past Surgical History, Family History, and Social History were reviewed in [employer].   Review of Systems            All symptoms NEG except where BOLDED >>  Constitutional:  F/C/S, fatigue, anorexia, unexpected weight change. HEENT:  HA, visual changes, hearing loss, earache,  nasal symptoms, sore throat, mouth sores, hoarseness. Resp:  cough, sputum, hemoptysis; SOB, tightness, wheezing. Cardio:  CP, palpit, DOE, orthopnea, edema. GI:  N/V/D/C, blood in stool; reflux, abd pain, distention, gas. GU:  dysuria, freq, urgency, hematuria, flank pain, voiding difficulty. MS:  joint pain, swelling, tenderness, decr ROM; neck pain, back pain, etc. Neuro:  HA, tremors, seizures, dizziness, syncope, weakness, numbness, gait abn. Skin:  suspicious lesions or skin rash. Heme:  adenopathy, bruising, bleeding. Psyche:  confusion, agitation, sleep disturbance, hallucinations, anxiety, depression suicidal.   Objective:   Physical Exam      Vital Signs:  Reviewed...   General:  WD, morbidly obese, 65 y/o WF in NAD; alert & oriented; pleasant & cooperative... HEENT:  [place]/AT; Conjunctiva- pink, Sclera- nonicteric, EOM-wnl, PERRLA, EACs-clear, TMs-wnl; NOSE-clear; THROAT-clear & wnl.  Neck:  Supple w/ decr ROM; no JVD; normal carotid impulses w/o bruits; no thyromegaly or nodules palpated; no lymphadenopathy.  Chest:  decr BS bilat at bases, but clear without wheezes, rales, or rhonchi heard. Heart:  Regular Rhythm; gr1/6SEM without rubs or gallops detected. Abdomen:  Obese, soft & nontender- no guarding or rebound; normal bowel sounds; no organomegaly or masses palpated. Ext:  decrROM; +arthritic changes; no varicose veins, +venous insuffic, 2+ edema;  Pulses intact w/o bruits. Neuro:  No focal neuro deficits, +gait abnormality & balance fair... Derm:  No lesions noted; no rash etc. Lymph:  No cervical, supraclavicular, axillary, or inguinal adenopathy  palpated.   Assessment:      IMP >>     Severe DYSPNEA>  She reports sl improved w/ wt loss so far & home oxygen therapy...    Nocturnal & exercise hypoxemia>  She desats to 86% on RA w/ min walking & signif O2 desats Qhs despite CPAP=> O2 incr to 3L/min bled into machine.    Pulmonary HTN>  2DEcho in [place] by DrFitagerald 5/16 indicated PAsys~80 (in 2014 it was ~34)...    OSA>  Initial sleep study by DrPickard & CPAP prescribed & followed by him; CPAP download from spring2016 showed AHI=1.5 on CPAP 11...    Morbid Obesity> Wt=356 lbs in Jun2016 & she is down to 311 lbs today; Franki Monte is planning to convert her Lap band to Sleeve gastrectomy soon...    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    HBP>  On Metop25Bid, CardizenCD120, Losar100, Lasix40 & BP=128/74...    PAF>  On Eliquis5Bid & Amio200; she has had numerous ablation procedures; now holding NSR on the amio...    Ven Insuffic/ Edema>  On Lasix80 now & the low salt diet, support hose, elevation etc...    Hypothyroid>  On Synthroid100 w/ TSH followed by DrPickard...  PLAN >>  03/22/15>   Heather [name] has severe SOB/DOE w/ ADLs and recent 2DEcho in [place] showing severe incr PAsys=84mHg;  The likely etiology of secondary pulmHTN is her severe morbid obesity (BMI=59), severe OSA & hypoxemia (see above);  [facility] & Epic shows f/u visits for [place]ut none since 2010 & she clearly didn't loose any weight ([facility] and to consider further options- ?gastric sleeve?);  She indicates that DrPickard started CPAP (she doesn't know the settings) & I cannot find orders or download data in Epic (we will call [facility] data);  Ambulatory O2 sat here today dropped to 86% on RA after one lap- she needs O2 w/ exercise and needs an ONO as well once  we see the download data regarding compliance; consider ABG to r/o OHS... Improving her PA pressure is likely  going to depend on appropriate CPAP management, relieving hypoxemia, and losing weight. 06/11/16>   We need to obtain an ONO on her 3L/min flow to prove that she is no longer desaturating at night on her CPAP; it would also be instructive for a repeat 2DEcho to recheck her PAsys est pressure; she is OK for the surgery which we hope will be life saving given these life threatening complications from her morbid obesity.   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       Now she presents w/ a superimposed acute dyspnea w/ CXR abn thought to suggest early CHF & a BNP=250 + 2DEcho w/ good LVF but Gr2DD, placed on Lasix w/ only min improvement;  Note recent Ortho eval w/ very high inflamm markers- treated w/ synvisc;  Further Pulm eval reveals Hi-res CT Chest suggesting COP & collagen vasc screen w/ a few unexplained random marker abn (eg- neg ANA & ANCA but pos RNP, plus the ACE level is sl elev & inflamm markers remain very high (sed=122);  She was placed on empiric trial of PRED pending some of our studies and she's had a good clinical response w/ improvement in her breathing (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  We will have her ret in 90moto assess response & recheck labs.  03/02/17>   CAnndreais improved- breathing better on the Pred & inflamm markers are much better;  This favors the Dx of COP & we will continue to wean the Pred from 239md now to 20-10 qod for 3wks and then 1012m til return in 6wks;  She is admonished to get on a vigorous low  carb, low fat diet & get her weight down, again I am in favor of Bariatric surg as a life-saving procedure for her- she still has severe secondary pulmHTN on treatment OSA, morbid obesity, restrictive pulm dis. 04/27/17>   We decided to continue the Pred at 20-10 Qod & incr her Lasix from 35m3mto 80mg72m w/ ROV recheck in 103mont53month/18>   CynthiArticeates to me that she is hoping to get her sleeve gastrectomy surg in Oct/Nov and needs to be on a low dose of Pred ~5mg/d 54mless for this to happen;  She is improved today w/ the Pred 20-10 qod & Lasix80/d => we decided to cut the Pred to 10mg/d 63mkeep the Lasix at 80Qam for now w/ rov 103mo for 57moeck leading up to her bariatric surg. 06/30/17>   We decided to cut the Pred from 10mg/d to104mg alt w70mg QOD;  C33minue Lasix 80mg Qam;  S39mnows to elim all salt/sodium from her diet & we will add pneumatic compression device for her leg edema;  We will continue to check pt monthly prior to her Bariatric surg.   Plan:     Patient's Medications  New Prescriptions   No medications on file  Previous Medications  ACETAMINOPHEN-CODEINE (TYLENOL #3) 300-30 MG TABLET    Take 1 tablet by mouth every 6 (six) hours as needed. for pain   AMITRIPTYLINE (ELAVIL) 10 MG TABLET    Take 1 tablet (10 mg total) by mouth at bedtime.   CETIRIZINE (ZYRTEC) 10 MG TABLET    Take 10 mg by mouth daily.   CHOLECALCIFEROL (VITAMIN D) 2000 UNITS TABLET    Take 2,000 Units by mouth daily.   CLONAZEPAM (KLONOPIN) 0.5 MG TABLET    Take 1/2 to 1 tablet by mouth two times daily   DICLOFENAC SODIUM (VOLTAREN) 1 % GEL    Apply 2 g topically 4 (four) times daily.   DILTIAZEM (CARDIZEM CD) 120 MG 24 HR CAPSULE    Take 128m by mouth daily   DRONEDARONE (MULTAQ) 400 MG TABLET    Take 400 mg by mouth 2 (two) times daily with a meal.   ELIQUIS 5 MG TABS TABLET    TAKE 1 TABLET BY MOUTH TWICE A DAY....NEED OFFICE VISIT BEFORE ANY MORE REFILLS   FUROSEMIDE (LASIX) 40 MG TABLET    Take  80 mg by mouth daily.    GUAIFENESIN (MUCINEX) 600 MG 12 HR TABLET    Take 600 mg by mouth 2 (two) times daily as needed. For allergies.   LEVOTHYROXINE (SYNTHROID, LEVOTHROID) 75 MCG TABLET    Take 1 tablet (75 mcg total) by mouth daily before breakfast.   LOSARTAN (COZAAR) 100 MG TABLET    TAKE 1 TABLET BY MOUTH  DAILY   MAGNESIUM 250 MG TABS    Take 250 mg by mouth 2 (two) times daily.   MELOXICAM (MOBIC) 15 MG TABLET    Take 1 tablet (15 mg total) by mouth daily.   METOPROLOL TARTRATE (LOPRESSOR) 25 MG TABLET    Take 25 mg by mouth 2 (two) times daily.    PREDNISONE (DELTASONE) 20 MG TABLET    TAKE AS DIRECTED  Modified Medications   No medications on file  Discontinued Medications   No medications on file

## 2017-06-30 NOTE — Patient Instructions (Signed)
Today we updated your med list in our EPIC system...    Continue your current medications the same...  We checked your follow up blood work today on the Lasix80 & Pred10...    We will contact you w/ the results when available, and then decide about med adjustments...  We did an ambulatory oximetry test today to requalify you for the home oxygen (per Medicare's requirement)...  We will also try to obtain a pneumatic compression device to help w/ leg swelling...  Call for any questions...  Let's plan a follow up visit in 6wks, sooner if needed for problems.Marland KitchenMarland Kitchen

## 2017-07-01 LAB — ANGIOTENSIN CONVERTING ENZYME: ANGIOTENSIN-CONVERTING ENZYME: 36 U/L (ref 9–67)

## 2017-07-11 ENCOUNTER — Other Ambulatory Visit (INDEPENDENT_AMBULATORY_CARE_PROVIDER_SITE_OTHER): Payer: Self-pay | Admitting: Orthopaedic Surgery

## 2017-08-06 ENCOUNTER — Telehealth: Payer: Self-pay | Admitting: Pulmonary Disease

## 2017-08-06 NOTE — Telephone Encounter (Signed)
Spoke with pt, who had questions about her upcoming surgery. During our convocation, pt realized she had contacted the incorrect office in regards to this matter.  Nothing further needed.

## 2017-08-11 ENCOUNTER — Ambulatory Visit: Payer: Self-pay | Admitting: Pulmonary Disease

## 2017-08-11 ENCOUNTER — Ambulatory Visit (INDEPENDENT_AMBULATORY_CARE_PROVIDER_SITE_OTHER): Payer: Medicare Other | Admitting: Pulmonary Disease

## 2017-08-11 ENCOUNTER — Encounter: Payer: Self-pay | Admitting: Pulmonary Disease

## 2017-08-11 VITALS — BP 132/78 | HR 56 | Temp 97.3°F | Ht 65.0 in | Wt 326.0 lb

## 2017-08-11 DIAGNOSIS — J849 Interstitial pulmonary disease, unspecified: Secondary | ICD-10-CM

## 2017-08-11 DIAGNOSIS — I890 Lymphedema, not elsewhere classified: Secondary | ICD-10-CM | POA: Diagnosis not present

## 2017-08-11 DIAGNOSIS — I5032 Chronic diastolic (congestive) heart failure: Secondary | ICD-10-CM | POA: Diagnosis not present

## 2017-08-11 DIAGNOSIS — I480 Paroxysmal atrial fibrillation: Secondary | ICD-10-CM | POA: Diagnosis not present

## 2017-08-11 DIAGNOSIS — I10 Essential (primary) hypertension: Secondary | ICD-10-CM | POA: Diagnosis not present

## 2017-08-11 DIAGNOSIS — I272 Pulmonary hypertension, unspecified: Secondary | ICD-10-CM

## 2017-08-11 DIAGNOSIS — R609 Edema, unspecified: Secondary | ICD-10-CM | POA: Diagnosis not present

## 2017-08-11 DIAGNOSIS — I872 Venous insufficiency (chronic) (peripheral): Secondary | ICD-10-CM

## 2017-08-11 DIAGNOSIS — G4733 Obstructive sleep apnea (adult) (pediatric): Secondary | ICD-10-CM | POA: Diagnosis not present

## 2017-08-11 DIAGNOSIS — Z7901 Long term (current) use of anticoagulants: Secondary | ICD-10-CM

## 2017-08-11 MED ORDER — TRAMADOL HCL 50 MG PO TABS: 50.0000 mg | ORAL_TABLET | Freq: Three times a day (TID) | ORAL | 5 refills | Status: DC

## 2017-08-11 NOTE — Progress Notes (Addendum)
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 Heather [name] taper & home oxygen therapy...    Hx cryptogenic organizing pneumonia>  She presented w/ incr dyspnea 4/18 & had some interstitial changes on CXR; Hi-res CT Chest favored COP & we started Heather [name] w/ nice response both symptomatically & by XRayOdette Hornsdecr Sed rate to normal & resolution of the interstial changes;  She is now on Heather [name] 10-5 Qod & Lasix at 80mg66m.. She denies cough, sputum, or change in her DOE    Nocturnal & exercise hypoxemia>  She desats to 86% on RA w/ min walking & signif O2 desats Qhs despite CPAP=> O2 incr to 3L/min bled into machine & f/u ONO confirms good response w/ resolution of nocturnal hypoxemia.    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    Morbid Obesity> Wt=356 lbs in Jun2016 & she is around 320 lbs today; DrNewmFranki Monteanning to convert her Lap band to Sleeve  gastrectomy soon & we hope that substantial wt loss & improvement in OSA will lead to further improvement in her PA pressures...    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    HBP>  On Metop25Bid, CardizenCD120, Losar100, Lasix80 & BP=132/78... She denies CP, palpit, dizzy, syncope, etc; she has VI, marked LE edema despite the Lasix & we will petition for a compression device...    PAF>  On Eliquis5Bid & Multaq400Bid; she has had numerous ablation procedures; now holding NSR so far...    Ven Insuffic/ Edema>  On Lasix80/d for the last month & the edema is sl diminished;  She knows to follow a strict low salt diet, support hose, elevation; we are trying to get her a pneumatic compression device...    Hypothyroid>  On Synthroid75 now w/ prev TSH 01/2017 = 5.37 on Synthroid50 at the time (therefore  dose increased slightly)... EXAM shows pear-shaped body habitus, Afeb, VSS, O2sat=97% on O2 at 2L/min pulse; Wt=326#, 5'5"Tall, BMI=54;  HEENT- neg, mallampati2;  Chest- decr BS at bases, w/o w/r/r;  Heart- RR gr1/6 SEM w/o r/g;  Abd- obese, neg;  Ext- VI w/ +edema... IMP/PLAN>>  It seems there is no immed rush to decr her Heather [name] to the 43m/d level required for surg since she is required to participate in a 67morogram of supervised wt loss w/ nutritional counseling etc via CCS;  Therefore we will continue the 1078mlt w/ 5mg70mD dose for now;  We wrote for TRAMADOL50 + tylenol for her arthritis pain;  She will continue w/ her O2, continue her meds, and work on weight reduction... For her venous insuffic/ lymphedema in LEs- she has been trying no salt, elevationn, support hose, & the Lasix, we will try to get her a pneumatic compression device...    Past Medical History:  Diagnosis Date  . Anemia    "onc;e; had to take iron for awhile"  . Atrial fibrillation (HCC)[place] Began in 2004.  [facility].  She is off coumadin  now  with no noted recurrent atrial fibrillation since 2007. til 04/01/12; s/p initiation of Rhythmol with DCCV June 2013  . Carpal tunnel syndrome   . Degenerative disk disease   . GERD (gastroesophageal reflux disease)    resolved after lap band  . History of blood transfusion    "w/both hip replacements"  . HTN (hypertension)   . Hypothyroidism   . IBS (irritable bowel syndrome)   . Interstitial cystitis   . Morton's neuroma    right foot  . Obesity   . On home oxygen therapy   . Pulmonary hypertension (HCC)[place]. Shortness of breath    only without oxygen  . Sleep apnea 04/01/12   "dx'd just last week"  . Sleep apnea    uses CPAP, 11 CWP with residual AHI 6.3    Past Surgical History:  Procedure Laterality Date  . ANTERIOR AND POSTERIOR REPAIR  10/20/2012   Procedure: ANTERIOR (CYSTOCELE) AND POSTERIOR REPAIR (RECTOCELE);  Surgeon: SandAlwyn Pea;  [facility] [place];  Service: Gynecology;  Laterality: N/A;  2 hours  . ATRIAL FIBRILLATION ABLATION N/A 12/30/2012   Procedure: ATRIAL FIBRILLATION ABLATION;  Surgeon: JameThompson Grayer;  [facility];  Service: Cardiovascular;  Laterality: N/A;  . BACK SURGERY    . CARDIAC CATHETERIZATION    . CARDIAC ELECTROPHYSIOLOGY MAPPING AND ABLATION  ~ 2005 and 2007   "[facility]  04/02/2012   Procedure: CARDIOVERSION;  Surgeon: DaltLarey Dresser;  [facility];  Laterality: N/A;  . CARDIOVERSION  06/16/2012   Procedure: CARDIOVERSION;  Surgeon: PhilThayer Headings;  [facility] [place];  Laterality: N/A;  amanda/ebp/[place]( or scheduling)  . CARDIOVERSION  10/29/2012   Procedure: CARDIOVERSION;  Surgeon: DaltLarey Dresser;  [facility];  Service: Cardiovascular;  Laterality: N/A;  . CARDIOVERSION N/A 03/30/2013   Procedure: CARDIOVERSION;  Surgeon: DaltLarey Dresser;  [facility] [place]  Cardiovascular;  Laterality: N/A;  . CARDIOVERSION N/A 12/23/2013   Procedure: CARDIOVERSION;  Surgeon: KataDorothy Spark;  [facility] Cardi[facility];  Laterality: N/A;  9:27 Propofol 70mg84m   150 joules synched shock by Dr. NelsoMeda Coffee  150 joules...SR   post 12 lead ordered.   . CARPAL TUNNEL RELEASE  1990's   bilaterally  . DILATION AND CURETTAGE OF UTERUS  2000  . JOINT REPLACEMENT     bilateral hips  . KNEE ARTHROSCOPY Right 11/18/2016   Procedure: RIGHT KNEE ARTHROSCOPY WITH DEBRIDEMENT;  Surgeon: Mcarthur Rossetti, MD;  Location: [place];  Service: Orthopedics;  Laterality: Right;  . LAPAROSCOPIC GASTRIC BANDING  2009  . LASIK    . POSTERIOR FUSION LUMBAR SPINE  2000's   "nerve problems"  . POSTERIOR FUSION LUMBAR SPINE  2000's  . TEE WITHOUT CARDIOVERSION N/A 12/29/2012   Procedure: TRANSESOPHAGEAL ECHOCARDIOGRAM (TEE);  Surgeon: Peter M Martinique, MD;  Location: [place];  Service: Cardiovascular;  Laterality: N/A;  . [place]  . TOTAL HIP ARTHROPLASTY  ~2009; 2010   right; left  . TUBAL LIGATION  1980  . VAGINAL HYSTERECTOMY  2000    Outpatient Encounter Prescriptions as of 08/11/2017  Medication Sig  . acetaminophen-codeine (TYLENOL #3) 300-30 MG tablet Take 1 tablet by mouth every 6 (six) hours as needed. for pain  . amitriptyline (ELAVIL) 10 MG tablet Take 1 tablet (10 mg total) by mouth at bedtime.  . cetirizine (ZYRTEC) 10 MG tablet Take 10 mg by mouth daily.  . Cholecalciferol (VITAMIN D) 2000 UNITS tablet Take 2,000 Units by mouth daily.  . clonazePAM (KLONOPIN) 0.5 MG tablet Take 1/2 to 1 tablet by mouth two times daily  . diclofenac sodium (VOLTAREN) 1 % GEL Apply 2 g topically 4 (four) times daily.  Marland Kitchen diltiazem (CARDIZEM CD) 120 MG 24 hr capsule Take 152m by mouth daily  . dronedarone (MULTAQ) 400 MG tablet Take 400 mg by mouth 2 (two) times daily with a meal.  . ELIQUIS 5 MG TABS tablet TAKE 1 TABLET BY  MOUTH TWICE A DAY....NEED OFFICE VISIT BEFORE ANY MORE REFILLS  . furosemide (LASIX) 40 MG tablet Take 80 mg by mouth daily.   .Marland KitchenguaiFENesin (MUCINEX) 600 MG 12 hr tablet Take 600 mg by mouth 2 (two) times daily as needed. For allergies.  .Marland Kitchenlevothyroxine (SYNTHROID, LEVOTHROID) 75 MCG tablet Take 1 tablet (75 mcg total) by mouth daily before breakfast.  . losartan (COZAAR) 100 MG tablet TAKE 1 TABLET BY MOUTH  DAILY  . Magnesium 250 MG TABS Take 250 mg by mouth 2 (two) times daily.  . meloxicam (MOBIC) 15 MG tablet TAKE 1 TABLET BY MOUTH EVERY DAY  . metoprolol tartrate (LOPRESSOR) 25 MG tablet Take 25 mg by mouth 2 (two) times daily.   . predniSONE (DELTASONE) 20 MG tablet TAKE AS DIRECTED  . traMADol (ULTRAM) 50 MG tablet Take 1 tablet (50 mg total) by mouth 3 (three) times daily.   No facility-administered encounter medications on file as of 08/11/2017.     Allergies  Allergen Reactions  . Cephalexin Hives and Other (See Comments)    "throat started closing up"  . Rocephin [Ceftriaxone Sodium In Dextrose] Anaphylaxis  . Penicillins Rash    Has patient had a PCN reaction causing immediate rash, facial/tongue/throat swelling, SOB or lightheadedness with hypotension: no Has patient had a PCN reaction causing severe rash involving mucus membranes or skin necrosis: no Has patient had a PCN reaction that required hospitalization no Has patient had a PCN reaction occurring within the last 10 years: no If all of the above answers are "NO", then may proceed with Cephalosporin use.  . Flecainide Acetate Other (See Comments)    "  couldn't take it"  . Moxifloxacin     Does not remember the reaction  . Sulfonamide Derivatives     Does not remember reaction ; "was so young when I had reaction to it"    Immunization History  Administered Date(s) Administered  . Influenza Split 07/13/2016  . Influenza,inj,Quad PF,6+ Mos 06/30/2017  . Influenza-Unspecified 11/04/2012, 08/24/2014  . Zoster  03/24/2013    Current Medications, Allergies, Past Medical History, Past Surgical History, Family History, and Social History were reviewed in [employer] record.   Review of Systems            All symptoms NEG except where BOLDED >>  Constitutional:  F/C/S, fatigue, anorexia, unexpected weight change. HEENT:  HA, visual changes, hearing loss, earache, nasal symptoms, sore throat, mouth sores, hoarseness. Resp:  cough, sputum, hemoptysis; SOB, tightness, wheezing. Cardio:  CP, palpit, DOE, orthopnea, edema. GI:  N/V/D/C, blood in stool; reflux, abd pain, distention, gas. GU:  dysuria, freq, urgency, hematuria, flank pain, voiding difficulty. MS:  joint pain, swelling, tenderness, decr ROM; neck pain, back pain, etc. Neuro:  HA, tremors, seizures, dizziness, syncope, weakness, numbness, gait abn. Skin:  suspicious lesions or skin rash. Heme:  adenopathy, bruising, bleeding. Psyche:  confusion, agitation, sleep disturbance, hallucinations, anxiety, depression suicidal.   Objective:   Physical Exam      Vital Signs:  Reviewed...   General:  WD, morbidly obese, 66 y/o WF in NAD; alert & oriented; pleasant & cooperative... HEENT:  [place]/AT; Conjunctiva- pink, Sclera- nonicteric, EOM-wnl, PERRLA, EACs-clear, TMs-wnl; NOSE-clear; THROAT-clear & wnl.  Neck:  Supple w/ decr ROM; no JVD; normal carotid impulses w/o bruits; no thyromegaly or nodules palpated; no lymphadenopathy.  Chest:  decr BS bilat at bases, but clear without wheezes, rales, or rhonchi heard. Heart:  Regular Rhythm; gr1/6SEM without rubs or gallops detected. Abdomen:  Obese, soft & nontender- no guarding or rebound; normal bowel sounds; no organomegaly or masses palpated. Ext:  decrROM; +arthritic changes; no varicose veins, +venous insuffic, lymphedema changes;  Pulses intact w/o bruits. Neuro:  No focal neuro deficits, +gait abnormality & balance fair... Derm:  No lesions noted; no rash etc. Lymph:   No cervical, supraclavicular, axillary, or inguinal adenopathy palpated.   Assessment:      IMP >>     Severe DYSPNEA>  She reports sl improved w/ wt loss so far & home oxygen therapy...    Hx cryptogenic organizing pneumonia> presented 4/18 w/ dyspnea & change in CXR=> His-res CT Chest suggesting COP & improved w/ Heather [name] Rx...    Nocturnal & exercise hypoxemia>  She desats to 86% on RA w/ min walking & signif O2 desats Qhs despite CPAP=> O2 incr to 3L/min bled into machine.    Pulmonary HTN>  2DEcho in [place] by DrFitagerald 5/16 indicated PAsys~80 (in 2014 it was ~34)...    OSA>  Initial sleep study by DrPickard & CPAP prescribed & followed by him; CPAP download from spring2016 showed AHI=1.5 on CPAP 11...    Morbid Obesity> Wt=356 lbs in Jun2016 & she is down to 311 lbs today; Franki Monte is planning to convert her Lap band to Sleeve gastrectomy soon...    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    HBP>  On Metop25Bid, CardizenCD120, Losar100, Lasix80 & BP=128/74...    PAF>  On Eliquis5Bid & Multaq400; she has had numerous ablation procedures; now holding NSR on the amio...    Ven Insuffic/ lymphedema>  On  Lasix80 & the low salt diet...    Hypothyroid>  On Synthroid100 w/ TSH followed by DrPickard...  PLAN >>  03/22/15>   Sharnae has severe SOB/DOE w/ ADLs and recent 2DEcho in [place] showing severe incr PAsys=30mHg;  The likely etiology of secondary pulmHTN is her severe morbid obesity (BMI=59), severe OSA & hypoxemia (see above);  [facility] & Epic shows f/u visits for 141yrut none since 2010 & she clearly didn't loose any weight ([facility] and to consider further options- ?gastric sleeve?);  She indicates that DrPickard started CPAP (she doesn't know the settings) & I cannot find orders or download data in Epic (we will call AHM[facility] Chapelor data);  Ambulatory O2 sat here today dropped to 86% on RA after  one lap- she needs O2 w/ exercise and needs an ONO as well once we see the download data regarding compliance; consider ABG to r/o OHS... Improving her PA pressure is likely going to depend on appropriate CPAP management, relieving hypoxemia, and losing weight. 06/11/16>   We need to obtain an ONO on her 3L/min flow to prove that she is no longer desaturating at night on her CPAP; it would also be instructive for a repeat 2DEcho to recheck her PAsys est pressure; she is OK for the surgery which we hope will be life saving given these life threatening complications from her morbid obesity.   01/29/17>   Heather Heather [name] a complex medical picture w/ underlying Morbid Obesity, Restrictive pulm dis, chronic hypoxemic resp failure on O2, & OSA on CPAP=> causing secondary pulm HTN (Prob WHO Group 3);  On effective CPAP therapy & 24/7 O2 w/ resolution of her hypoxemia- her PA pressure has improved from ~80 (02/2015) to ~65 at present, unfortunately she was turned down by her insurance company for further Bariatric surg as I still believe that a successful gastric sleeve procedure could prove life saving!       Now she presents w/ a superimposed acute dyspnea w/ CXR abn thought to suggest early CHF & a BNP=250 + 2DEcho w/ good LVF but Gr2DD, placed on Lasix w/ only min improvement;  Note recent Ortho eval w/ very high inflamm markers- treated w/ synvisc;  Further Pulm eval reveals Hi-res CT Chest suggesting COP & collagen vasc screen w/ a few unexplained random marker abn (eg- neg ANA & ANCA but pos RNP, plus the ACE level is sl elev & inflamm markers remain very high (sed=122);  She was placed on empiric trial of Heather [name] pending some of our studies and she's had a good clinical response w/ improvement in her breathing (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  We will have her ret in 31m631mo assess response & recheck labs. 03/02/17>   Heather Heather [name] improved- breathing better on the Heather [name] & inflamm markers are much better;  This  favors the Dx of COP & we will continue to wean the Heather [name] from 14m82mnow to 20-10 qod for 3wks and then 10mg631mil return in 6wks;  She is admonished to get on a vigorous low carb, low fat diet & get her weight down, again I am in favor of Bariatric surg as a life-saving procedure for her- she still has severe secondary pulmHTN on treatment OSA, morbid obesity, restrictive pulm dis. 04/27/17>   We decided to continue the Heather [name] at 20-10 Qod & incr her Lasix from 40mg/66m 80mg Q82m/ ROV recheck in 368month.4month8>  Heather Heather [name] indicates to me that she is hoping to get her sleeve gastrectomy surg in Oct/Nov and needs to be on a low dose of Heather [name] ~772m/d or less for this to happen;  She is improved today w/ the Heather [name] 20-10 qod & Lasix80/d => we decided to cut the Heather [name] to 166md and keep the Lasix at 80Qam for now w/ rov 72m772mor recheck leading up to her bariatric surg. 06/30/17>   We decided to cut the Heather [name] from 23m16mto 23mg80m w/ 5mg Q30m  Continue Lasix 80mg Q42m She knows to elim all salt/sodium from her diet & we will add pneumatic compression device for her leg edema;  We will continue to check pt monthly prior to her Bariatric surg. 08/11/17>   It seems there is no immed rush to decr her Heather [name] to the 5mg/d l49ml required for surg since she is required to participate in a 24mo prog90moof supervised wt loss w/ nutritional counseling etc via CCS;  Therefore we will continue the 23mg alt 76mmg QOD do33mfor now;  We wrote for TRAMADOL50 + tylenol for her arthritis pain;  She will continue w/ her O2, continue her meds, and work on weight reduction   Plan:     Patient's Medications  New Prescriptions   TRAMADOL (ULTRAM) 50 MG TABLET    Take 1 tablet (50 mg total) by mouth 3 (three) times daily.  Previous Medications   ACETAMINOPHEN-CODEINE (TYLENOL #3) 300-30 MG TABLET    Take 1 tablet by mouth every 6 (six) hours as needed. for pain   AMITRIPTYLINE (ELAVIL) 10 MG TABLET    Take 1 tablet (10 mg total) by mouth  at bedtime.   CETIRIZINE (ZYRTEC) 10 MG TABLET    Take 10 mg by mouth daily.   CHOLECALCIFEROL (VITAMIN D) 2000 UNITS TABLET    Take 2,000 Units by mouth daily.   CLONAZEPAM (KLONOPIN) 0.5 MG TABLET    Take 1/2 to 1 tablet by mouth two times daily   DICLOFENAC SODIUM (VOLTAREN) 1 % GEL    Apply 2 g topically 4 (four) times daily.   DILTIAZEM (CARDIZEM CD) 120 MG 24 HR CAPSULE    Take 120mg by mou40maily   DRONEDARONE (MULTAQ) 400 MG TABLET    Take 400 mg by mouth 2 (two) times daily with a meal.   ELIQUIS 5 MG TABS TABLET    TAKE 1 TABLET BY MOUTH TWICE A DAY....NEED OFFICE VISIT BEFORE ANY MORE REFILLS   FUROSEMIDE (LASIX) 40 MG TABLET    Take 80 mg by mouth daily.    GUAIFENESIN (MUCINEX) 600 MG 12 HR TABLET    Take 600 mg by mouth 2 (two) times daily as needed. For allergies.   LEVOTHYROXINE (SYNTHROID, LEVOTHROID) 75 MCG TABLET    Take 1 tablet (75 mcg total) by mouth daily before breakfast.   LOSARTAN (COZAAR) 100 MG TABLET    TAKE 1 TABLET BY MOUTH  DAILY   MAGNESIUM 250 MG TABS    Take 250 mg by mouth 2 (two) times daily.   MELOXICAM (MOBIC) 15 MG TABLET    TAKE 1 TABLET BY MOUTH EVERY DAY   METOPROLOL TARTRATE (LOPRESSOR) 25 MG TABLET    Take 25 mg by mouth 2 (two) times daily.    PREDNISONE (DELTASONE) 20 MG TABLET    TAKE AS DIRECTED  Modified Medications   No medications on file  Discontinued Medications   No medications on file

## 2017-08-11 NOTE — Patient Instructions (Signed)
Today we updated your med list in our EPIC system...    Continue your current medications the same...  Continue the PREDNISONE at 10mg  alternating w/ 5mg  every other day as we discussed...  Stay as active as possible...  Contact [facility] regarding the required nutritional counseling...  Call for any questions...  Let's plan a follow up visit in 6wks, sooner if needed for problems.Marland Kitchen..Marland Kitchen

## 2017-08-21 ENCOUNTER — Telehealth: Payer: Self-pay | Admitting: Pulmonary Disease

## 2017-08-21 NOTE — Telephone Encounter (Signed)
Spoke with Hilda LiasMarie at [employer], states that in order for them to deliver the lymphedema pump they will need a diagnosis on the pt's last 2 office visit notes of lymphedema, as well as documentation of trial and failure of compression stockings to manage lymphedema.    SN please advise if the 08/11/17 and 06/30/17 AVS can be addended to reflect the above recommendations.  Thanks.

## 2017-08-24 DIAGNOSIS — I890 Lymphedema, not elsewhere classified: Secondary | ICD-10-CM | POA: Insufficient documentation

## 2017-08-24 NOTE — Telephone Encounter (Signed)
SN has addended last office note.  This has been printed and faxed to Hilda LiasMarie at [employer] at below fax.  Nothing further needed.

## 2017-08-31 ENCOUNTER — Other Ambulatory Visit: Payer: Self-pay | Admitting: Pulmonary Disease

## 2017-09-07 ENCOUNTER — Encounter: Payer: Self-pay | Admitting: Registered"

## 2017-09-07 ENCOUNTER — Encounter: Payer: Medicare Other | Attending: Surgery | Admitting: Registered"

## 2017-09-07 DIAGNOSIS — Z713 Dietary counseling and surveillance: Secondary | ICD-10-CM | POA: Diagnosis present

## 2017-09-07 DIAGNOSIS — E669 Obesity, unspecified: Secondary | ICD-10-CM

## 2017-09-07 NOTE — Progress Notes (Signed)
Pre-Op Assessment Visit:  Pre-Operative LAGB to Sleeve gastrectomy Surgery  Medical Nutrition Therapy:  Appt start time: 11:05  End time:  12:00  Patient was seen on 09/07/2017 for Pre-Operative Nutrition Assessment. Assessment and [facility] on 09/07/2017.   Pt expectation of surgery: to improve health, eliminate being on oxygen 24 hrs a day, goal is to use as needed,   Pt expectation of Dietitian: help with losing weight while still on prednisone  Start weight at [facility]: 322.5 BMI: 54.50   Pt states she had LAGB 9 years ago, got down to 230/260 lbs. Pt states she is hoping to go to local [employer] 3 times/week to walk laps around pool with husband.  Pt states she has gas with milk, prefers to buy powdered milk instead of lactose free milk. Pt states she has limited mobility with right knee. Pt states she is sticking to a low-carb diet because she will need to do it for the rest of life. Pt states she cannot take MVI with calcium in it because after 2-3 days of taking calcium "I can't put one foot in front of the other".   Per insurance, pt needs 6 SWL visits prior to surgery.    24 hr Dietary Recall: First Meal: protein shake Snack: sometimes pork rinds or carrots or celery Second Meal: burger patty, carrot sticks, dill pickles Snack: none Third Meal: brunswick stew Snack: none Beverages: water, decaf tea, soda  Encouraged to engage in 75 minutes of moderate physical activity including cardiovascular and weight baring weekly  Handouts given during visit include:  . Pre-Op Goals . Bariatric Surgery Protein Shakes . Vitamin and Mineral Recommendations  During the appointment today the following Pre-Op Goals were reviewed with the patient: . Track your food and beverage: MyFitness Pal or Baritastic App . Make healthy food choices . Begin to limit portion sizes . Limited concentrated sugars and fried  foods . Keep fat/sugar in the single digits per serving on          food labels . Practice CHEWING your food  (aim for 30 chews per bite or until applesauce consistency) . Practice not drinking 15 minutes before, during, and 30 minutes after each meal/snack . Avoid all carbonated beverages  . Avoid/limit caffeinated beverages  . Avoid all sugar-sweetened beverages . Avoid alcohol . Consume 3 meals per day; eat every 3-5 hours . Make a list of non-food related activities . Aim for 64-100 ounces of FLUID daily  . Aim for at least 60-80 grams of PROTEIN daily . Look for a liquid protein source that contain ?15 g protein and ?5 g carbohydrate  (ex: shakes, drinks, shots) . Physical activity is an important part of a healthy lifestyle so keep it moving!  Follow diet recommendations listed below Energy and Macronutrient Recommendations: Calories: 1600 Carbohydrate: 180 Protein: 120 Fat: 44  Demonstrated degree of understanding via:  Teach Back   Teaching Method Utilized:  Visual Auditory  Barriers to learning/adherence to lifestyle change: limited physical mobility  Patient to call the Nutrition and Diabetes Education Services to enroll in Pre-Op and Post-Op Nutrition Education when surgery date is scheduled.

## 2017-09-15 ENCOUNTER — Telehealth: Payer: Self-pay | Admitting: Pulmonary Disease

## 2017-09-15 NOTE — Telephone Encounter (Signed)
Hilda LiasMarie returning call, CB is [phone] ext [phone].

## 2017-09-15 NOTE — Telephone Encounter (Signed)
In the meanwhile, MR can you write a letter of medical necessity for the compression pump?

## 2017-09-15 NOTE — Telephone Encounter (Signed)
Received form for medical necessity. I will place on SN cart to sign.

## 2017-09-15 NOTE — Telephone Encounter (Signed)
ATC Marie, no answer. Left message for pt to call back.   Fax number. [phone]

## 2017-09-15 NOTE — Telephone Encounter (Signed)
This a SN pt. I spoke to Hilda LiasMarie and advised her to re-fax the LMN and attn to me. I will await the form and give to SN.

## 2017-09-16 ENCOUNTER — Telehealth: Payer: Self-pay | Admitting: Pulmonary Disease

## 2017-09-16 NOTE — Telephone Encounter (Signed)
Spoke with pt, she states the pharmacy will not fill her Tramadol because it interacts with the Klonopin. She states she can stop taking this but she states it helps her not be so jumpy from the Prednisone and knee pain. SN please advise if you would like to override this.   [facility]

## 2017-09-16 NOTE — Telephone Encounter (Signed)
Faxed signed medical necessity form.

## 2017-09-16 NOTE — Telephone Encounter (Signed)
Scott: this was meant for you  Dr. Kalman ShanMurali Jeanene Mena, M.D., [facility].P Pulmonary and Critical Care Medicine Staff Physician, [facility] Director - Interstitial Lung Disease  Program  Pulmonary Fibrosis [facility] [place], [place], [id number]  Pager: [phone], If no answer or between  15:00h - 7:00h: call [phone] Telephone: [phone]

## 2017-09-18 NOTE — Telephone Encounter (Signed)
Per SN---  In my opinion, it is ok to take these 2 meds---just not at the same time.  Space them out and use the lowest dose needed.    I have called and lmom to make the pt aware. Nothing further is needed.

## 2017-09-22 ENCOUNTER — Ambulatory Visit: Payer: Self-pay | Admitting: Pulmonary Disease

## 2017-09-30 ENCOUNTER — Other Ambulatory Visit (INDEPENDENT_AMBULATORY_CARE_PROVIDER_SITE_OTHER): Payer: Self-pay

## 2017-09-30 ENCOUNTER — Encounter: Payer: Self-pay | Admitting: Pulmonary Disease

## 2017-09-30 ENCOUNTER — Ambulatory Visit (INDEPENDENT_AMBULATORY_CARE_PROVIDER_SITE_OTHER): Payer: Medicare Other | Admitting: Pulmonary Disease

## 2017-09-30 VITALS — BP 134/68 | HR 95 | Temp 97.6°F | Ht 65.0 in | Wt 321.0 lb

## 2017-09-30 DIAGNOSIS — I872 Venous insufficiency (chronic) (peripheral): Secondary | ICD-10-CM

## 2017-09-30 DIAGNOSIS — I10 Essential (primary) hypertension: Secondary | ICD-10-CM

## 2017-09-30 DIAGNOSIS — I480 Paroxysmal atrial fibrillation: Secondary | ICD-10-CM

## 2017-09-30 DIAGNOSIS — I5032 Chronic diastolic (congestive) heart failure: Secondary | ICD-10-CM

## 2017-09-30 DIAGNOSIS — I272 Pulmonary hypertension, unspecified: Secondary | ICD-10-CM

## 2017-09-30 DIAGNOSIS — J849 Interstitial pulmonary disease, unspecified: Secondary | ICD-10-CM

## 2017-09-30 DIAGNOSIS — I890 Lymphedema, not elsewhere classified: Secondary | ICD-10-CM | POA: Diagnosis not present

## 2017-09-30 DIAGNOSIS — Z7901 Long term (current) use of anticoagulants: Secondary | ICD-10-CM | POA: Diagnosis not present

## 2017-09-30 DIAGNOSIS — G4733 Obstructive sleep apnea (adult) (pediatric): Secondary | ICD-10-CM

## 2017-09-30 LAB — BASIC METABOLIC PANEL
BUN: 35 mg/dL — AB (ref 6–23)
CALCIUM: 9.4 mg/dL (ref 8.4–10.5)
CO2: 31 mEq/L (ref 19–32)
Chloride: 100 mEq/L (ref 96–112)
Creatinine, Ser: 1.22 mg/dL — ABNORMAL HIGH (ref 0.40–1.20)
GFR: 46.98 mL/min — AB (ref >60.00)
GLUCOSE: 106 mg/dL — AB (ref 70–99)
Potassium: 4.1 mEq/L (ref 3.5–5.1)
SODIUM: 141 meq/L (ref 135–145)

## 2017-09-30 LAB — SEDIMENTATION RATE: Sed Rate: 18 mm/hr (ref 0–30)

## 2017-09-30 NOTE — Progress Notes (Signed)
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 normal & resolution of the interstial changes;  She is now on Pred 10-5 Qod & Lasix at '80mg'$ Qam... She denies cough, sputum, or change in her DOE    Nocturnal & exercise hypoxemia>  She desats to 86% on RA w/ min walking & signif O2 desats Qhs despite CPAP=> O2 incr to 3L/min bled into machine & f/u ONO confirms good response w/ resolution of nocturnal hypoxemia.    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    Morbid Obesity> Wt=356 lbs in Jun2016 & she is around 320 lbs today; DrFranki Montes planning to convert her Lap band to Sleeve  gastrectomy soon & we hope that substantial wt loss & improvement in OSA will lead to further improvement in her PA pressures...    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    HBP>  On Metop25Bid, CardizenCD120, Losar100, Lasix80 & BP=132/78... She denies CP, palpit, dizzy, syncope, etc; she has VI, marked LE edema despite the Lasix & we will petition for a compression device...    PAF>  On Eliquis5Bid & Multaq400Bid; she has had numerous ablation procedures; now holding NSR so far...    Ven Insuffic/ Edema (lymphedema)>  On Lasix80/d for the last month & the edema is sl diminished;  She knows to follow a strict low salt diet, support hose, elevation; we are trying to get her a pneumatic compression device...    Hypothyroid>  On Synthroid75 now w/ prev TSH 01/2017 = 5.37 on Synthroid50 at the time (therefore  dose increased slightly)... EXAM shows pear-shaped body habitus, Afeb, VSS, O2sat=97% on O2 at 2L/min pulse; Wt=326#, 5'5"Tall, BMI=54;  HEENT- neg, mallampati2;  Chest- decr BS at bases, w/o w/r/r;  Heart- RR gr1/6 SEM w/o r/g;  Abd- obese, neg;  Ext- VI w/ +edema... IMP/PLAN>>  It seems there is no immed rush to decr her Pred to the '5mg'$ /d level required for surg since she is required to participate in a 24moprogram of supervised wt loss w/ nutritional counseling etc via [facility];  Therefore we will continue the '10mg'$  alt w/ '5mg'$  QOD dose for now;  We wrote for TRAMADOL50 + tylenol for her arthritis pain;  She will continue w/ her O2, continue her meds, and work on weight reduction... For her venous insuffic/ lymphedema in LEs- she has been trying no salt, elevation, support hose, & the Lasix, we will try to get her a pneumatic compression device...   ~  September 30, 2017:  251moOV & Heather [name] for a 51m5moV on Pred 10-5 qod and states she hasn't required any adjustment in dose over the interval; She remains on CPAP Qhs w/ O2 at 3L/min bleed-in  and using 2L/min days; all her home pulse-ox checks are in the 90s & heart reate in the 60s; she denies cough/ sput/ hemoptysis/ CP/ etc; her DOE is the same (OK w/ ADLs and getting about, still not able to exercise significantly- using Tramadol+Tylenol vs Tylenol#3 for arthritis pain), and her edema is without change- on low sodium, taking Lasix80, and still awaiting the pneumatic compression device...    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    Nocturnal & exercise hypoxemia> stable on the 3L/min Qhs and 2L/min daytime use...    OSA> [facility] & CPAP download over the last 30d shows excellent compliance and efficacy (see below face to face note)    Obesity> weight today is 321# (down 5#) and we are awaiting bariatric surg...    VI/ lymphedema> on low sodium & Lasix80, we are awaiting approval for pneumatic compression device... EXAM shows pear-shaped body habitus, Afeb, VSS, O2sat=92% on O2 at 2L/min pulse; Wt=321#, 5'5"Tall, BMI=53;  HEENT- neg, mallampati2;  Chest- decr BS at bases, w/o w/r/r;  Heart- RR gr1/6 SEM w/o r/g;  Abd- obese, neg;  Ext- VI w/ +edema...  LABS 09/30/17>  BMet- stable w/ BS=106, BUN=35, Cr=1.22;  ACE=46 (9-67);  Sed=18 (0-30) IMP/PLAN>>  OK to wean Pred to '5mg'$  daily as we discussed;  Continue same CPAP & face-to-face note below;  Continue diet, nutritional counseling from [facility]- moving toward bariatric surg date in 2019;  Still awaiting pneumatic compression device to aide in edema management so we can cut down on her Lasix dose later...  09/30/17 FACE-to-FACE note for CPAP>> [facility] the last 51mo85moAP download from 10/30 - 09/09/17 shows excellent compliance (using it 30/30 days for ave 8 hrs per night), and excellent efficacy (on CPAP 11 w/ AHI<1 and no signif leaks)...    Past Medical History:  Diagnosis Date  . Anemia     "onc;e; had to take iron for awhile"  . Atrial fibrillation (HCC)[place]egan in 2004.  [facility].  She is off coumadin  now with no noted recurrent atrial fibrillation  since 2007. til 04/01/12; s/p initiation of Rhythmol with DCCV June 2013  . Carpal tunnel syndrome   . Degenerative disk disease   . GERD (gastroesophageal reflux disease)    resolved after lap band  . History of blood transfusion    "w/both hip replacements"  . HTN (hypertension)   . Hypothyroidism   . IBS (irritable bowel syndrome)   . Interstitial cystitis   . Morton's neuroma    right foot  . Obesity   . On home oxygen therapy   . Pulmonary hypertension ([place])   . Shortness of breath    only without oxygen  . Sleep apnea 04/01/12   "dx'd just last week"  . Sleep apnea    uses CPAP, 11 CWP with residual AHI 6.3    Past Surgical History:  Procedure Laterality Date  . ANTERIOR AND POSTERIOR REPAIR  10/20/2012   Procedure: ANTERIOR (CYSTOCELE) AND POSTERIOR REPAIR (RECTOCELE);  Surgeon: Alwyn Pea, MD;  Location: [place] [facility];  Service: Gynecology;  Laterality: N/A;  2 hours  . ATRIAL FIBRILLATION ABLATION N/A 12/30/2012   Procedure: ATRIAL FIBRILLATION ABLATION;  Surgeon: Thompson Grayer, MD;  Location: [facility] CATH LAB;  Service: Cardiovascular;  Laterality: N/A;  . BACK SURGERY    . CARDIAC CATHETERIZATION    . CARDIAC ELECTROPHYSIOLOGY MAPPING AND ABLATION  ~ 2005 and 2007   "did more 2nd time; [facility] at [facility]"  . CARDIOVERSION  04/02/2012   Procedure: CARDIOVERSION;  Surgeon: Larey Dresser, MD;  Location: [place];  Service: Cardiovascular;  Laterality: N/A;  . CARDIOVERSION  06/16/2012   Procedure: CARDIOVERSION;  Surgeon: Thayer Headings, MD;  Location: [place];  Service: Cardiovascular;  Laterality: N/A;  amanda/ebp/[place]( or scheduling)  . CARDIOVERSION  10/29/2012   Procedure: CARDIOVERSION;  Surgeon: Larey Dresser, MD;  Location: [facility] ENDOSCOPY;  Service:  Cardiovascular;  Laterality: N/A;  . CARDIOVERSION N/A 03/30/2013   Procedure: CARDIOVERSION;  Surgeon: Larey Dresser, MD;  Location: [place];  Service: Cardiovascular;  Laterality: N/A;  . CARDIOVERSION N/A 12/23/2013   Procedure: CARDIOVERSION;  Surgeon: Dorothy Spark, MD;  Location: [place];  Service: Cardiovascular;  Laterality: N/A;  9:27 Propofol '70mg'$ , IV   150 joules synched shock by Dr. Meda Coffee @ 150 joules...SR   post 12 lead ordered.   . CARPAL TUNNEL RELEASE  1990's   bilaterally  . DILATION AND CURETTAGE OF UTERUS  2000  . JOINT REPLACEMENT     bilateral hips  . KNEE ARTHROSCOPY Right 11/18/2016   Procedure: RIGHT KNEE ARTHROSCOPY WITH DEBRIDEMENT;  Surgeon: Mcarthur Rossetti, MD;  Location: [place];  Service: Orthopedics;  Laterality: Right;  . LAPAROSCOPIC GASTRIC BANDING  2009  . LASIK    . POSTERIOR FUSION LUMBAR SPINE  2000's   "nerve problems"  . POSTERIOR FUSION LUMBAR SPINE  2000's  . TEE WITHOUT CARDIOVERSION N/A 12/29/2012   Procedure: TRANSESOPHAGEAL ECHOCARDIOGRAM (TEE);  Surgeon: Peter M Martinique, MD;  Location: [place];  Service: Cardiovascular;  Laterality: N/A;  . [place]  . TOTAL HIP ARTHROPLASTY  ~2009; 2010   right; left  . TUBAL LIGATION  1980  . VAGINAL HYSTERECTOMY  2000    Outpatient Encounter Medications as of 09/30/2017  Medication Sig  . acetaminophen-codeine (TYLENOL #3) 300-30 MG tablet Take 1 tablet by mouth every 6 (six) hours as needed. for pain  . amitriptyline (ELAVIL) 10 MG tablet Take 1 tablet (10 mg total) by mouth at bedtime.  . cetirizine (  ZYRTEC) 10 MG tablet Take 10 mg by mouth daily.  . Cholecalciferol (VITAMIN D) 2000 UNITS tablet Take 2,000 Units by mouth daily.  . clonazePAM (KLONOPIN) 0.5 MG tablet Take 1/2 to 1 tablet by mouth two times daily  . diclofenac sodium (VOLTAREN) 1 % GEL Apply 2 g topically 4 (four) times daily.  Marland Kitchen diltiazem (CARDIZEM CD) 120 MG 24 hr capsule Take '120mg'$   by mouth daily  . dronedarone (MULTAQ) 400 MG tablet Take 400 mg by mouth 2 (two) times daily with a meal.  . ELIQUIS 5 MG TABS tablet TAKE 1 TABLET BY MOUTH TWICE A DAY....NEED OFFICE VISIT BEFORE ANY MORE REFILLS  . furosemide (LASIX) 40 MG tablet Take 80 mg by mouth daily.   Marland Kitchen guaiFENesin (MUCINEX) 600 MG 12 hr tablet Take 600 mg by mouth 2 (two) times daily as needed. For allergies.  Marland Kitchen levothyroxine (SYNTHROID, LEVOTHROID) 75 MCG tablet Take 1 tablet (75 mcg total) by mouth daily before breakfast.  . losartan (COZAAR) 100 MG tablet TAKE 1 TABLET BY MOUTH  DAILY  . Magnesium 250 MG TABS Take 250 mg by mouth 2 (two) times daily.  . meloxicam (MOBIC) 15 MG tablet TAKE 1 TABLET BY MOUTH EVERY DAY  . metoprolol tartrate (LOPRESSOR) 25 MG tablet Take 25 mg by mouth 2 (two) times daily.   . predniSONE (DELTASONE) 5 MG tablet TAKE AS TAPERING DOSE SCHEDULE PER DR Tykia Mellone  . traMADol (ULTRAM) 50 MG tablet Take 1 tablet (50 mg total) by mouth 3 (three) times daily.  . [DISCONTINUED] predniSONE (DELTASONE) 20 MG tablet TAKE AS DIRECTED   No facility-administered encounter medications on file as of 09/30/2017.     Allergies  Allergen Reactions  . Cephalexin Hives and Other (See Comments)    "throat started closing up"  . Rocephin [Ceftriaxone Sodium In Dextrose] Anaphylaxis  . Penicillins Rash    Has patient had a PCN reaction causing immediate rash, facial/tongue/throat swelling, SOB or lightheadedness with hypotension: no Has patient had a PCN reaction causing severe rash involving mucus membranes or skin necrosis: no Has patient had a PCN reaction that required hospitalization no Has patient had a PCN reaction occurring within the last 10 years: no If all of the above answers are "NO", then may proceed with Cephalosporin use.  . Flecainide Acetate Other (See Comments)    "couldn't take it"  . Moxifloxacin     Does not remember the reaction  . Sulfonamide Derivatives     Does not remember  reaction ; "was so young when I had reaction to it"    Immunization History  Administered Date(s) Administered  . Influenza Split 07/13/2016  . Influenza,inj,Quad PF,6+ Mos 06/30/2017  . Influenza-Unspecified 11/04/2012, 08/24/2014  . Zoster 03/24/2013    Current Medications, Allergies, Past Medical History, Past Surgical History, Family History, and Social History were reviewed in [employer] record.   Review of Systems            All symptoms NEG except where BOLDED >>  Constitutional:  F/C/S, fatigue, anorexia, unexpected weight change. HEENT:  HA, visual changes, hearing loss, earache, nasal symptoms, sore throat, mouth sores, hoarseness. Resp:  cough, sputum, hemoptysis; SOB, tightness, wheezing. Cardio:  CP, palpit, DOE, orthopnea, edema. GI:  N/V/D/C, blood in stool; reflux, abd pain, distention, gas. GU:  dysuria, freq, urgency, hematuria, flank pain, voiding difficulty. MS:  joint pain, swelling, tenderness, decr ROM; neck pain, back pain, etc. Neuro:  HA, tremors, seizures, dizziness, syncope, weakness, numbness,  gait abn. Skin:  suspicious lesions or skin rash. Heme:  adenopathy, bruising, bleeding. Psyche:  confusion, agitation, sleep disturbance, hallucinations, anxiety, depression suicidal.   Objective:   Physical Exam      Vital Signs:  Reviewed...   General:  WD, morbidly obese, 66 y/o WF in NAD; alert & oriented; pleasant & cooperative... HEENT:  [place]/AT; Conjunctiva- pink, Sclera- nonicteric, EOM-wnl, PERRLA, EACs-clear, TMs-wnl; NOSE-clear; THROAT-clear & wnl.  Neck:  Supple w/ decr ROM; no JVD; normal carotid impulses w/o bruits; no thyromegaly or nodules palpated; no lymphadenopathy.  Chest:  decr BS bilat at bases, but clear without wheezes, rales, or rhonchi heard. Heart:  Regular Rhythm; gr1/6SEM without rubs or gallops detected. Abdomen:  Obese, soft & nontender- no guarding or rebound; normal bowel sounds; no organomegaly or masses  palpated. Ext:  decrROM; +arthritic changes; no varicose veins, +venous insuffic, lymphedema changes;  Pulses intact w/o bruits. Neuro:  No focal neuro deficits, +gait abnormality & balance fair... Derm:  No lesions noted; no rash etc. Lymph:  No cervical, supraclavicular, axillary, or inguinal adenopathy palpated.   Assessment:      IMP >>     Severe DYSPNEA>  She reports sl improved w/ wt loss so far & home oxygen therapy...    Hx cryptogenic organizing pneumonia> presented 4/18 w/ dyspnea & change in CXR=> His-res CT Chest suggesting COP & improved w/ Pred Rx...    Nocturnal & exercise hypoxemia>  She desats to 86% on RA w/ min walking & signif O2 desats Qhs despite CPAP=> O2 incr to 3L/min bled into machine.    Pulmonary HTN>  2DEcho in [place] by DrFitagerald 5/16 indicated PAsys~80 (in 2014 it was ~34)...    OSA>  Initial sleep study by DrPickard & CPAP prescribed & followed by him; CPAP download from spring2016 showed AHI=1.5 on CPAP 11...    Morbid Obesity> Wt=356 lbs in Jun2016 & she is down to 311 lbs today; Franki Monte is planning to convert her Lap band to Sleeve gastrectomy soon...    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    HBP>  On Metop25Bid, CardizenCD120, Losar100, Lasix80 & BP=128/74...    PAF>  On Eliquis5Bid & Multaq400; she has had numerous ablation procedures; now holding NSR on the amio...    Ven Insuffic/ lymphedema>  On Lasix80 & the low salt diet...    Hypothyroid>  On Synthroid100 w/ TSH followed by DrPickard...  PLAN >>   01/29/17>   Heather [name] has a complex medical picture w/ underlying Morbid Obesity, Restrictive pulm dis, chronic hypoxemic resp failure on O2, & OSA on CPAP=> causing secondary pulm HTN (Prob WHO Group 3);  On effective CPAP therapy & 24/7 O2 w/ resolution of her hypoxemia- her PA pressure has improved from ~80 (02/2015) to ~65 at present, unfortunately she was turned down by her insurance company  for further Bariatric surg as I still believe that a successful gastric sleeve procedure could prove life saving!       Now she presents w/ a superimposed acute dyspnea w/ CXR abn thought to suggest early CHF & a BNP=250 + 2DEcho w/ good LVF but Gr2DD, placed on Lasix w/ only min improvement;  Note recent Ortho eval w/ very high inflamm markers- treated w/ synvisc;  Further Pulm eval reveals Hi-res CT Chest suggesting COP & collagen vasc screen w/ a few unexplained random marker abn (eg- neg ANA & ANCA but pos RNP, plus the ACE level is sl  elev & inflamm markers remain very high (sed=122);  She was placed on empiric trial of PRED pending some of our studies and she's had a good clinical response w/ improvement in her breathing (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  We will have her ret in 542moto assess response & recheck labs. 03/02/17>   Heather [name] improved- breathing better on the Pred & inflamm markers are much better;  This favors the Dx of COP & we will continue to wean the Pred from '20mg'$ /d now to 20-10 qod for 3wks and then '10mg'$ /d til return in 6wks;  She is admonished to get on a vigorous low carb, low fat diet & get her weight down, again I am in favor of Bariatric surg as a life-saving procedure for her- she still has severe secondary pulmHTN on treatment OSA, morbid obesity, restrictive pulm dis. 04/27/17>   We decided to continue the Pred at 20-10 Qod & incr her Lasix from '40mg'$ /d to '80mg'$  Qam w/ ROV recheck in 19month8/16/18>   Heather [name] to me that she is hoping to get her sleeve gastrectomy surg in Oct/Nov and needs to be on a low dose of Pred ~'5mg'$ /d or less for this to happen;  She is improved today w/ the Pred 20-10 qod & Lasix80/d => we decided to cut the Pred to '10mg'$ /d and keep the Lasix at 80Qam for now w/ rov 42m14mor recheck leading up to her bariatric surg. 06/30/17>   We decided to cut the Pred from '10mg'$ /d to '10mg'$  alt w/ '5mg'$  QOD;  Continue Lasix '80mg'$  Qam;  She knows to elim all  salt/sodium from her diet & we will add pneumatic compression device for her leg edema;  We will continue to check pt monthly prior to her Bariatric surg. 08/11/17>   It seems there is no immed rush to decr her Pred to the '5mg'$ /d level required for surg since she is required to participate in a 2mo30mogram of supervised wt loss w/ nutritional counseling etc via [facility];  Therefore we will continue the '10mg'$  alt w/ '5mg'$  QOD dose for now;  We wrote for TRAMADOL50 + tylenol for her arthritis pain;  She will continue w/ her O2, continue her meds, and work on weight reduction 09/30/17>   OK to wean Pred to '5mg'$  daily as we discussed;  Continue same CPAP & face-to-face note below;  Continue diet, nutritional counseling from [facility]- moving toward bariatric surg date in 2019;  Still awaiting pneumatic compression device to aide in edema management so we can cut down on her Lasix dose later   Plan:       Medication List        Accurate as of 09/30/17  4:38 PM. Always use your most recent med list.          acetaminophen-codeine 300-30 MG tablet Commonly known as:  TYLENOL #3 Take 1 tablet by mouth every 6 (six) hours as needed. for pain   amitriptyline 10 MG tablet Commonly known as:  ELAVIL Take 1 tablet (10 mg total) by mouth at bedtime.   cetirizine 10 MG tablet Commonly known as:  ZYRTEC   clonazePAM 0.5 MG tablet Commonly known as:  KLONOPIN Take 1/2 to 1 tablet by mouth two times daily   diclofenac sodium 1 % Gel Commonly known as:  VOLTAREN Apply 2 g topically 4 (four) times daily.   diltiazem 120 MG 24 hr capsule Commonly known as:  CARDIZEM CD   dronedarone 400  MG tablet Commonly known as:  MULTAQ   ELIQUIS 5 MG Tabs tablet Generic drug:  apixaban TAKE 1 TABLET BY MOUTH TWICE A DAY....NEED OFFICE VISIT BEFORE ANY MORE REFILLS   furosemide 40 MG tablet Commonly known as:  LASIX   guaiFENesin 600 MG 12 hr tablet Commonly known as:  MUCINEX   levothyroxine 75 MCG tablet Commonly  known as:  SYNTHROID, LEVOTHROID Take 1 tablet (75 mcg total) by mouth daily before breakfast.   losartan 100 MG tablet Commonly known as:  COZAAR TAKE 1 TABLET BY MOUTH  DAILY   Magnesium 250 MG Tabs   meloxicam 15 MG tablet Commonly known as:  MOBIC TAKE 1 TABLET BY MOUTH EVERY DAY   metoprolol tartrate 25 MG tablet Commonly known as:  LOPRESSOR   predniSONE 5 MG tablet Commonly known as:  DELTASONE TAKE AS TAPERING DOSE SCHEDULE PER DR Mariadelosang Wynns   traMADol 50 MG tablet Commonly known as:  ULTRAM Take 1 tablet (50 mg total) by mouth 3 (three) times daily.   Vitamin D 2000 units tablet

## 2017-09-30 NOTE — Patient Instructions (Signed)
Today we updated your med list in our EPIC system...    Continue your current medications the same...  Today we rechecked your LABS prior to considering a drop in your Prednisone dose...    We will contact you w/ the results when available...   We reviewed your O2 requirements and it should be 2L/min days, and 3L/min bleed-in to the CPAP at bedtime...  Call for any questions...  Let's plan a follow up visit in 33mo, sooner if needed for problems.Marland Kitchen..Marland Kitchen

## 2017-10-01 LAB — ANGIOTENSIN CONVERTING ENZYME: Angiotensin-Converting Enzyme: 46 U/L (ref 9–67)

## 2017-10-02 ENCOUNTER — Encounter: Payer: Medicare Other | Attending: Surgery | Admitting: Registered"

## 2017-10-02 ENCOUNTER — Encounter: Payer: Self-pay | Admitting: Registered"

## 2017-10-02 DIAGNOSIS — Z713 Dietary counseling and surveillance: Secondary | ICD-10-CM | POA: Diagnosis present

## 2017-10-02 DIAGNOSIS — E669 Obesity, unspecified: Secondary | ICD-10-CM

## 2017-10-02 NOTE — Patient Instructions (Addendum)
-   Aim to track food and fluid using Baritastic App.   - Aim for at 15 min, at least 4 days/week.   - Continue to focus more on healthy habits/changes and not numbers.

## 2017-10-02 NOTE — Progress Notes (Signed)
Appt start time: 11:13 end time: 11:36  Assessment: 1st SWL Appointment.   Start Wt at NDES: 322.5 Wt: 322.0 BMI: 54.42   Pt is very weight-focused.  Pt arrives having maintained weight from previous visit. Pt sates she has a lot of knee pain treated with medication (Tramadol) therefore limits physical activity.  Pt states she has a lot of fluid. Pt is frustrated that she continues to gain weight while taking prednisone; also hypothyroidism. Pt expresses frustration also with recently being turned down with insurance related to compression tights for legs and also that this bariatric process has become longer. Pt states she has not been able to work on previously set goals.   Pt states she cannot take calcium therefore does not take MVI, has started taking iron.   MEDICATIONS: See list   DIETARY INTAKE:  24-hr recall:  First Meal: protein shake Snack: sometimes pork rinds or carrots or celery Second Meal: burger patty, carrot sticks, dill pickles Snack: none Third Meal: [place] stew Snack: none Beverages: water, decaf tea, soda  Usual physical activity: none stated   Diet to Follow: 1600 calories 180 g carbohydrates 120 g protein 44 g fat  Preferred Learning Style:   No preference indicated   Learning Readiness:   Contemplating  Ready  Change in progress     Nutritional Diagnosis:  [place]-3.3 Overweight/obesity related to past poor dietary habits and physical inactivity as evidenced by patient w/ planned sleeve gastrectomy surgery following dietary guidelines for continued weight loss.    Intervention:  Nutrition counseling for upcoming Bariatric Surgery.  Goals:  - Aim for 150 minutes of physical activity including cardio and weight bearing every week - Aim to track food and fluid using Baritastic App.  - Aim for at 15 min, at least 4 days/week.  - Continue to focus more on healthy habits/changes and not numbers.   Teaching Method Utilized:   Visual Auditory Hands on  Handouts given during visit include:  none  Barriers to learning/adherence to lifestyle change: contemplative stage of change  Demonstrated degree of understanding via:  Teach Back   Monitoring/Evaluation:  Dietary intake, exercise, and body weight in 1 month(s).

## 2017-10-27 ENCOUNTER — Other Ambulatory Visit: Payer: Self-pay | Admitting: Pulmonary Disease

## 2017-10-28 ENCOUNTER — Other Ambulatory Visit: Payer: Self-pay | Admitting: *Deleted

## 2017-10-28 MED ORDER — CLONAZEPAM 0.5 MG PO TABS: ORAL_TABLET | ORAL | 3 refills | Status: DC

## 2017-10-30 ENCOUNTER — Encounter: Payer: Medicare Other | Attending: Surgery | Admitting: Registered"

## 2017-10-30 ENCOUNTER — Encounter: Payer: Self-pay | Admitting: Registered"

## 2017-10-30 DIAGNOSIS — Z713 Dietary counseling and surveillance: Secondary | ICD-10-CM | POA: Diagnosis not present

## 2017-10-30 DIAGNOSIS — E669 Obesity, unspecified: Secondary | ICD-10-CM

## 2017-10-30 NOTE — Patient Instructions (Addendum)
-   Aim to track food and fluid by handwriting.    - Aim for at 15 min, at least 4 days/week pf physical activity.   - Aim to chew at least 30 times per bite or to applesauce consistency.

## 2017-10-30 NOTE — Progress Notes (Signed)
Appt start time: 11:18 end time: 11:35  Assessment: 1st SWL Appointment.   Start Wt at [facility]: 322.5 Wt: 322.0, 314.5  BMI: 53.15   Pt is very weight-focused.  Pt arrives having lost 7.5 lbs from previous visit. Pt states she was sick shortly after Christmas and just now getting better therefore unable to do previously set goals. Pt states she was not able to track between visits; prefers to track by handwriting instead of using [employer] app. Pt states she misplaced App information sheet. Pt states she drinks about 90 ounces a day. Pt states she has eliminated soda. Pt states surgeon wants her to be down although this information was not included in her referral then states he wants her to at least maintain weight since she is on prednisone.   Pt sates she has a lot of knee pain treated with medication (Tramadol) therefore limits physical activity.  Pt states she has a lot of fluid. Pt is frustrated that she continues to gain weight while taking prednisone; also hypothyroidism. Pt expresses frustration also with recently being turned down with insurance related to compression tights for legs and also that this bariatric process has become longer. Pt states she has not been able to work on previously set goals.   Pt states she cannot take calcium therefore does not take MVI, has started taking iron.   MEDICATIONS: See list   DIETARY INTAKE:  24-hr recall:  First Meal: protein shake Snack: sometimes pork rinds or carrots or celery Second Meal: burger patty, carrot sticks, dill pickles Snack: none Third Meal: [place] stew Snack: none Beverages: water, decaf tea  Usual physical activity: none stated   Diet to Follow: 1600 calories 180 g carbohydrates 120 g protein 44 g fat  Preferred Learning Style:   No preference indicated   Learning Readiness:   Contemplating  Ready  Change in progress     Nutritional Diagnosis:  [place]-3.3 Overweight/obesity related to past poor  dietary habits and physical inactivity as evidenced by patient w/ planned sleeve gastrectomy surgery following dietary guidelines for continued weight loss.    Intervention:  Nutrition counseling for upcoming Bariatric Surgery.  Goals:  - Aim for 150 minutes of physical activity including cardio and weight bearing every week - Aim to track food and fluid by handwriting.   - Aim for at 15 min, at least 4 days/week pf physical activity.  - Aim to chew at least 30 times per bite or to applesauce consistency.   Teaching Method Utilized:  Visual Auditory Hands on  Handouts given during visit include:  Arm Exercises  Barriers to learning/adherence to lifestyle change: contemplative stage of change  Demonstrated degree of understanding via:  Teach Back   Monitoring/Evaluation:  Dietary intake, exercise, and body weight in 1 month(s).

## 2017-11-03 ENCOUNTER — Other Ambulatory Visit: Payer: Self-pay | Admitting: Pulmonary Disease

## 2017-11-06 ENCOUNTER — Other Ambulatory Visit: Payer: Self-pay | Admitting: Pulmonary Disease

## 2017-11-22 ENCOUNTER — Other Ambulatory Visit: Payer: Self-pay | Admitting: Pulmonary Disease

## 2017-11-27 ENCOUNTER — Encounter: Payer: Medicare Other | Attending: Surgery | Admitting: Registered"

## 2017-11-27 ENCOUNTER — Encounter: Payer: Self-pay | Admitting: Registered"

## 2017-11-27 DIAGNOSIS — Z713 Dietary counseling and surveillance: Secondary | ICD-10-CM | POA: Diagnosis present

## 2017-11-27 DIAGNOSIS — E669 Obesity, unspecified: Secondary | ICD-10-CM

## 2017-11-27 NOTE — Progress Notes (Signed)
Appt start time: 10:45 end time: 11:05  Assessment: 3rd SWL Appointment.   Start Wt at [facility]: 322.5 Wt: 318.7  BMI: 53.86   Pt is very weight-focused.  Pt arrives having gained 4.2 lbs from previous visit. Pt states she has not been able to track by writing down things because she forgets and the notebook is not located in convenient place. Pt states she has days that she does not feel good, keeps her from physical activity, tracking food, and other things she feels she should be doing. Pt states she has not felt well since being on prednisone. Pt states she has become more impatient and easily frustrated, then prays to God and that helps her. Pt states she is doing well with chewing as well as not drinking 15 minutes before, not while eating, and waiting 30 minutes after eating. Pt states she is visiting 66 year old relative next week. Pt states she needs to shift her thinking into not focusing on the scale/numbers and more about health. Pt is eating 3 meals/day along with snacks.  Pt states she was sick shortly after Christmas and just now getting better therefore unable to do previously set goals. Pt states she was not able to track between visits; prefers to track by handwriting instead of using [employer] app. Pt states she drinks about 90 ounces a day. Pt states she has eliminated soda. Pt states surgeon wants her to be down although this information was not included in her referral then states he wants her to at least maintain weight since she is on prednisone.   Pt states she has a lot of knee pain treated with medication (Tramadol) therefore limits physical activity.  Pt states she has a lot of fluid. Pt is frustrated that she continues to gain weight while taking prednisone; also hypothyroidism. Pt expresses frustration also with recently being turned down with insurance related to compression tights for legs and also that this bariatric process has become longer. Pt states she has not been  able to work on previously set goals.   Pt states she cannot take calcium therefore does not take MVI, has started taking iron.   MEDICATIONS: See list   DIETARY INTAKE:  24-hr recall:  First Meal: 2 eggs, bacon/sausage  Snack: cheese stick  Second Meal: meat, celery or salad  Snack: sugar-free cookie or fruit Third Meal: meat, vegetables Snack: fruit Beverages: water, decaf tea, green tea, coffee, Propel  Usual physical activity: arm exercises 15-20 min inconsistently  Diet to Follow: 1600 calories 180 g carbohydrates 120 g protein 44 g fat  Preferred Learning Style:   No preference indicated   Learning Readiness:   Contemplating  Ready  Change in progress     Nutritional Diagnosis:  [place]-3.3 Overweight/obesity related to past poor dietary habits and physical inactivity as evidenced by patient w/ planned sleeve gastrectomy surgery following dietary guidelines for continued weight loss.    Intervention:  Nutrition counseling for upcoming Bariatric Surgery.  Goals:  - Aim for 15-20 min/day of arm exercises 3 days/week.  - Use new notebook for tracking intake. Keep on end table. Track intake.  - Continue to renew your mind along this journey.    Teaching Method Utilized:  Visual Auditory Hands on  Handouts given during visit include:  none  Barriers to learning/adherence to lifestyle change: contemplative stage of change  Demonstrated degree of understanding via:  Teach Back   Monitoring/Evaluation:  Dietary intake, exercise, and body weight in 1 month(s).

## 2017-11-27 NOTE — Patient Instructions (Signed)
-   Aim for 15-20 min/day of arm exercises 3 days/week.   - Use new notebook for tracking intake. Keep on end table. Track intake.   - Continue to renew your mind along this journey.

## 2017-11-30 ENCOUNTER — Ambulatory Visit: Payer: Self-pay | Admitting: Registered"

## 2017-12-23 ENCOUNTER — Encounter: Payer: Medicare Other | Attending: Surgery | Admitting: Registered"

## 2017-12-23 ENCOUNTER — Encounter: Payer: Self-pay | Admitting: Registered"

## 2017-12-23 DIAGNOSIS — Z713 Dietary counseling and surveillance: Secondary | ICD-10-CM | POA: Insufficient documentation

## 2017-12-23 DIAGNOSIS — E669 Obesity, unspecified: Secondary | ICD-10-CM

## 2017-12-23 NOTE — Patient Instructions (Addendum)
-   Aim for 15-20 min/day of arm exercises 3 days/week.  - Keep up the great work!

## 2017-12-23 NOTE — Progress Notes (Signed)
Appt start time: 2:46 end time: 3:01  Assessment: 4th SWL Appointment.   Start Wt at [facility]: 322.5 Wt: 319.1 BMI: 53.93   Pt is very weight-focused.  Pt arrives having maintained weight from previous visit.  Pt states she had flu for 2 weeks an unable to eat 3 meals a day; otherwise eats 3 meals a day. Pt states she is increasing physical activity by walking more and doing some arm exercises. Pt states she is trying to eat more low-carbohydrate options; eating more non-starchy vegetables than usual. Pt states she has been tracking her food and drink intake with her new notebook. Pt is doing great with behavioral changes.   Pt states she has become more impatient and easily frustrated, then prays to God and that helps her. Pt states she is doing well with chewing as well as not drinking 15 minutes before, not while eating, and waiting 30 minutes after eating. Pt states she needs to shift her thinking into not focusing on the scale/numbers and more about health. Pt is eating 3 meals/day along with snacks.  Pt states she drinks about 90 ounces a day. Pt states she has eliminated soda. Pt states surgeon wants her to be down although this information was not included in her referral then states he wants her to at least maintain weight since she is on prednisone.   Pt states she has a lot of knee pain treated with medication (Tramadol) therefore limits physical activity.  Pt states she has a lot of fluid. Pt is frustrated that she continues to gain weight while taking prednisone; also hypothyroidism. Pt expresses frustration also with recently being turned down with insurance related to compression tights for legs and also that this bariatric process has become longer.  Pt states she cannot take calcium therefore does not take MVI, has started taking iron.   MEDICATIONS: See list   DIETARY INTAKE:  24-hr recall:  First Meal: 2 eggs, bacon/sausage  Snack: cheese stick  Second Meal: meat, celery  or salad  Snack: sugar-free cookie or fruit Third Meal: meat, vegetables Snack: fruit Beverages: water, decaf tea, green tea, coffee, Propel  Usual physical activity: arm exercises 15-20 min inconsistently  Diet to Follow: 1600 calories 180 g carbohydrates 120 g protein 44 g fat  Preferred Learning Style:   No preference indicated   Learning Readiness:   Contemplating  Ready  Change in progress     Nutritional Diagnosis:  [place]-3.3 Overweight/obesity related to past poor dietary habits and physical inactivity as evidenced by patient w/ planned sleeve gastrectomy surgery following dietary guidelines for continued weight loss.    Intervention:  Nutrition counseling for upcoming Bariatric Surgery.  Goals:  - Aim for 15-20 min/day of arm exercises 3 days/week. - Keep up the great work!  Teaching Method Utilized:  Visual Auditory Hands on  Handouts given during visit include:  none  Barriers to learning/adherence to lifestyle change: contemplative stage of change  Demonstrated degree of understanding via:  Teach Back   Monitoring/Evaluation:  Dietary intake, exercise, and body weight in 1 month(s).

## 2017-12-24 ENCOUNTER — Ambulatory Visit: Payer: Self-pay | Admitting: Registered"

## 2017-12-29 ENCOUNTER — Ambulatory Visit (INDEPENDENT_AMBULATORY_CARE_PROVIDER_SITE_OTHER)
Admission: RE | Admit: 2017-12-29 | Discharge: 2017-12-29 | Disposition: A | Discharge status: Home / Self Care | Payer: Self-pay | Source: Ambulatory Visit | Attending: Pulmonary Disease | Admitting: Pulmonary Disease

## 2017-12-29 ENCOUNTER — Ambulatory Visit (INDEPENDENT_AMBULATORY_CARE_PROVIDER_SITE_OTHER): Payer: Medicare Other | Admitting: Pulmonary Disease

## 2017-12-29 ENCOUNTER — Encounter: Payer: Self-pay | Admitting: Pulmonary Disease

## 2017-12-29 VITALS — BP 134/72 | HR 55 | Temp 97.8°F | Ht 65.0 in | Wt 327.0 lb

## 2017-12-29 DIAGNOSIS — I890 Lymphedema, not elsewhere classified: Secondary | ICD-10-CM | POA: Diagnosis not present

## 2017-12-29 DIAGNOSIS — I872 Venous insufficiency (chronic) (peripheral): Secondary | ICD-10-CM | POA: Diagnosis not present

## 2017-12-29 DIAGNOSIS — Z7901 Long term (current) use of anticoagulants: Secondary | ICD-10-CM | POA: Diagnosis not present

## 2017-12-29 DIAGNOSIS — I480 Paroxysmal atrial fibrillation: Secondary | ICD-10-CM

## 2017-12-29 DIAGNOSIS — I5032 Chronic diastolic (congestive) heart failure: Secondary | ICD-10-CM | POA: Diagnosis not present

## 2017-12-29 DIAGNOSIS — G4733 Obstructive sleep apnea (adult) (pediatric): Secondary | ICD-10-CM | POA: Diagnosis not present

## 2017-12-29 DIAGNOSIS — I272 Pulmonary hypertension, unspecified: Secondary | ICD-10-CM | POA: Diagnosis not present

## 2017-12-29 DIAGNOSIS — J849 Interstitial pulmonary disease, unspecified: Secondary | ICD-10-CM

## 2017-12-29 DIAGNOSIS — I10 Essential (primary) hypertension: Secondary | ICD-10-CM

## 2017-12-29 MED ORDER — TRAMADOL HCL 50 MG PO TABS: 50.0000 mg | ORAL_TABLET | Freq: Three times a day (TID) | ORAL | 3 refills | Status: DC

## 2017-12-29 NOTE — Patient Instructions (Signed)
Today we updated your med list in our EPIC system...     We decided to cut your PREDNISONE dose to 5mg  each AM...  Today we did a follow up CXR...    We will contact you w/ the results when available...   We will also sched a repeat 2DEcho in advance of [facility]  Keep up the good work w/ your CPAP & the lymphedema pneumatic leg pump treatments...  Call for any questions...  Let's plan a follow up visit in [phone] sooner if needed for problems.Marland Kitchen..Marland Kitchen

## 2017-12-29 NOTE — Progress Notes (Signed)
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 Pred 10-5 Qod & Lasix at '80mg'$ Qam... She denies cough, sputum, or change in her DOE    Nocturnal & exercise hypoxemia>  She desats to 86% on RA w/ min walking & signif O2 desats Qhs despite CPAP=> O2 incr to 3L/min bled into machine & f/u ONO confirms good response w/ resolution of nocturnal hypoxemia.    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    Morbid Obesity> Wt=356 lbs in Jun2016 & she is around 320 lbs today; DrFranki Montes planning to convert her Lap band to Sleeve  gastrectomy soon & we hope that substantial wt loss & improvement in OSA will lead to further improvement in her PA pressures...    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    HBP>  On Metop25Bid, CardizenCD120, Losar100, Lasix80 & BP=132/78... She denies CP, palpit, dizzy, syncope, etc; she has VI, marked LE edema despite the Lasix & we will petition for a compression device...    PAF>  On Eliquis5Bid & Multaq400Bid; she has had numerous ablation procedures; now holding NSR so far...    Ven Insuffic/ Edema (lymphedema)>  On Lasix80/d for the last month & the edema is sl diminished;  She knows to follow a strict low salt diet, support hose, elevation; we are trying to get her a pneumatic compression device...    Hypothyroid>  On Synthroid75 now w/ prev TSH 01/2017 = 5.37 on Synthroid50 at the time (therefore  dose increased slightly)...    Medical issues> PCP- DrPickard; HBP, hypothyroid, GERD, IC, s/p bilat THR, DJD- knees, etc...  EXAM shows pear-shaped body habitus, Afeb, VSS, O2sat=97% on O2 at 2L/min pulse; Wt=326#, 5'5"Tall, BMI=54;  HEENT- neg, mallampati2;  Chest- decr BS at bases, w/o w/r/r;  Heart- RR gr1/6 SEM w/o r/g;  Abd- obese, neg;  Ext- VI w/ +edema... IMP/PLAN>>  It seems there is no immed rush to decr her Pred to the 61m/d level required for surg since she is required to participate in a 635morogram of supervised wt loss w/ nutritional counseling etc via [facility];  Therefore we will continue the 1049mlt w/ 5mg17mD dose for now;  We wrote for TRAMADOL50 + tylenol for her arthritis pain;  She will continue w/ her O2, continue her meds, and work on weight reduction... For her venous insuffic/ lymphedema in LEs- she has been trying no salt, elevation, support hose, & the Lasix, we will try to get her a pneumatic compression device...  ~  September 30, 2017:  5mo 29mo& CynthTaesharns for a 5mo R57mon Pred 10-5 qod and states she hasn't  required any adjustment in dose over the interval; She remains on CPAP Qhs w/ O2 at 3L/min bleed-in and using 2L/min days; all her home pulse-ox checks are in the 90s & heart reate in the 60s; she denies cough/ sput/ hemoptysis/ CP/ etc; her DOE is the same (OK w/ ADLs and getting about, still not able to exercise significantly- using Tramadol+Tylenol vs Tylenol#3 for arthritis pain), and her edema is without change- on low sodium, taking Lasix80, and still awaiting the pneumatic compression device...    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    OSA> [facility] & CPAP download over the last 30d shows excellent compliance and efficacy (see below face to face note)    Obesity> weight today is 321# (down 5#) and we are awaiting bariatric surg...    VI/ lymphedema> on low sodium & Lasix80, we are awaiting approval for pneumatic compression device... EXAM shows pear-shaped body habitus, Afeb, VSS, O2sat=92% on O2 at 2L/min pulse; Wt=321#, 5'5"Tall, BMI=53;  HEENT- neg, mallampati2;  Chest- decr BS at bases, w/o w/r/r;  Heart- RR gr1/6 SEM w/o r/g;  Abd- obese, neg;  Ext- VI w/ +edema...  LABS 09/30/17>  BMet- stable w/ BS=106, BUN=35, Cr=1.22;  ACE=46 (9-67);  Sed=18 (0-30) IMP/PLAN>>  OK to wean Pred to 5mg dai40mas we discussed;  Continue same CPAP & face-to-face note below;  Continue diet, nutritional counseling from [facility]- moving toward bariatric surg date in 2019;  Still awaiting pneumatic compression device to aide in edema management so we can cut down on her Lasix dose later...  09/30/17 FACE-to-FACE note for CPAP>> Pt has been on new CPAP machine thru [facility] for [facility] last 5mo- CPA58mownload from 10/30 - 09/09/17 shows excellent compliance (using it 30/30 days for ave 8 hrs per night), and excellent efficacy  (on CPAP 11 w/ AHI<1 and no signif leaks)...   ~  December 29, 2017:  3 month ROV & pulmonary/ medical follow up visit>  Angelynn cAmberlys to maneuver thru the [facility]-Bariatric program requirements for surg, getting monthly nutrition visits that continue thru ~June then surg  can be scheduled;  She finally got the lymphedema leg wrap pneumatic compression device & has been pumping for 1H three times daily- she feels that it is already helping & she is improved... We reviewed these interval Epic notes>    She saw SURG- DrNewman 11/06/17>  She has lost ~50# from her peak wt of 365# in 2016; on Pred 10-5Qod for her lung condition, she has a lap-band (placed 2009) & they are planning to convert to a sleeve gastrectomy...     She had f/u w/ CAR[facility]- NP for DrFitzgerald in [place]>  HBP, AFib, OSA, PulmHTN; s/p mult ablation procedures, on Eliquis5Bid, Multaq400Bid, CardizemCD120, Metop25Bid, Losar100, Lasix40-2Qam; no change in meds...     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    Hx cryptogenic organizing pneumonia>  She presented w/ incr dyspnea 4/18 & had some interstitial changes on CXR; Hi-res CT Chest favored COP (but ACE was 76- r/o Sarcoid) & we started Pred w/ nice response both symptomatically & by Heather [name] w/ decr Sed rate to normal & resolution of the interstial changes;  She is now on Pred 10-5 Qod & Lasix at '80mg'$ Qam... She denies cough, sputum, or change in her DOE    Nocturnal & exercise hypoxemia>  She desats to 86% on RA w/ min walking & signif O2 desats Qhs despite CPAP=> O2 incr to 3L/min bled into machine &  f/u ONO confirms good response w/ resolution of nocturnal hypoxemia.    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    Morbid Obesity> Wt=365 lbs in Jun2016 & she is around 320 lbs today; DFranki Monteis planning to convert her Lap band to Sleeve gastrectomy soon & we hope that substantial wt loss & improvement in OSA will lead to further improvement in her PA pressures...    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    HBP>  On Metop25Bid, CardizenCD120, Losar100, Lasix80 & BP=134/72... She denies CP, palpit, dizzy, syncope, etc; she has VI, marked LE edema despite the Lasix & we will petition for a compression device=> finally approved & she is using the device for 1H Tid & feels that it is helping...     PAF>  On Eliquis5Bid & Multaq400Bid; she has had numerous ablation procedures; now holding NSR so far...    Ven Insuffic/ Edema (lymphedema)>  On Lasix80/d for the last month & the edema is sl diminished;  She knows to follow a strict low salt diet, support hose, elevation; we are trying to get her a pneumatic compression device...    Hypothyroid>  On Synthroid75 now w/ prev TSH 01/2017 = 5.37 on  Synthroid50 at the time (therefore dose increased slightly)...    Medical issues> PCP- DrPickard; HBP, hypothyroid, GERD, IC, s/p bilat THR, DJD- knees, etc...  EXAM shows pear-shaped body habitus, Afeb, VSS, O2sat=92% on O2 at 2L/min pulse; Wt=327#, 5'5"Tall, BMI=53;  HEENT- neg, mallampati2;  Chest- decr BS at bases, w/o w/r/r;   Heart- RR gr1/6 SEM w/o r/g;  Abd- obese, neg;  Ext- VI w/ +edema...  CXR 12/29/17>  Cardiomeg & tortuous Ao, lungs are felt to be clear w/o focal abn, lap band seen in LU[facility]...  CPAP download 2/17-3/18/19>  Excellent compliance (used 30/30 days, 8-9H per night), CPAP 11 cmH2O, AHI=<1, min leak...  2DEcho done 01/06/18>  Norm LVF w/ EF=60-65%, no regional wall motion abn, could not eval LV diastolic function, AoV sclerosis & triv regurg, AscAo is 58m, MV mildly thickened w/ mild regurg, mild RV dil, RA is severely dil, mod TRelev PAsys=723mg (which is sl worse than 4/18 value of 6568m)... IMP/PLAN>>  Unfortunately the est PA pressure is increased over the last yr- she is using the CPAP regularly, using her oxygen regularly, taking her meds faithfully, etc; hopefully the sleeve gastrectomy planned for ?June will result in the needed wt reduction w/ subseq improvement in her PulmHTN;  We decided to wean the Pred down to 5mg36mm, anxiously awaiting the bariatric surg planned for June...     Past Medical History:  Diagnosis Date  . Anemia    "onc;e; had to take iron for awhile"  . Atrial fibrillation (HCC)[place] Began in 2004.  [facility].  She is off coumadin  now with no noted recurrent atrial fibrillation since 2007. til 04/01/12; s/p initiation of Rhythmol with DCCV June 2013  . Carpal tunnel syndrome   . Degenerative disk disease   . GERD (gastroesophageal reflux disease)    resolved after lap band  . History of blood transfusion    "w/both hip replacements"  . HTN (hypertension)   . Hypothyroidism   . IBS (irritable bowel syndrome)   . Interstitial cystitis   . Morton's neuroma    right foot  . Obesity   . On home oxygen therapy   . Pulmonary hypertension (HCC)[place]. Shortness of breath    only without oxygen  . Sleep apnea 04/01/12   "dx'd just last week"  . Sleep apnea    uses CPAP, 11 CWP with residual AHI 6.3    Past Surgical History:   Procedure Laterality Date  . ANTERIOR AND POSTERIOR REPAIR  10/20/2012   Procedure: ANTERIOR (CYSTOCELE) AND POSTERIOR REPAIR (RECTOCELE);  Surgeon: SandAlwyn Pea;  [place] [place];  Service: Gynecology;  Laterality: N/A;  2 hours  . ATRIAL FIBRILLATION ABLATION N/A 12/30/2012   Procedure: ATRIAL FIBRILLATION ABLATION;  Surgeon: JameThompson Grayer;  [facility];  Service: Cardiovascular;  Laterality: N/A;  . BACK SURGERY    . CARDIAC CATHETERIZATION    . CARDIAC ELECTROPHYSIOLOGY MAPPING AND ABLATION  ~ 2005 and 2007   "[facility] CARDIOVERSION  04/02/2012   Procedure: CARDIOVERSION;  Surgeon: DaltLarey Dresser;  [facility] Cardiovascular;  Laterality: N/A;  . CARDIOVERSION  06/16/2012   Procedure: CARDIOVERSION;  Surgeon: PhilThayer Headings;  Location: [facility]: Cardiovascular;  Laterality: N/A;  amanda/ebp/[place]( or scheduling)  . CARDIOVERSION  10/29/2012   Procedure: CARDIOVERSION;  Surgeon: DaltLarey Dresser  MD;  Location: [place];  Service: Cardiovascular;  Laterality: N/A;  . CARDIOVERSION N/A 03/30/2013   Procedure: CARDIOVERSION;  Surgeon: Larey Dresser, MD;  Location: [place];  Service: Cardiovascular;  Laterality: N/A;  . CARDIOVERSION N/A 12/23/2013   Procedure: CARDIOVERSION;  Surgeon: Dorothy Spark, MD;  Location: [place];  Service: Cardiovascular;  Laterality: N/A;  9:27 Propofol 46m, IV   150 joules synched shock by Dr. NMeda Coffee@ 150 joules...SR   post 12 lead ordered.   . CARPAL TUNNEL RELEASE  1990's   bilaterally  . DILATION AND CURETTAGE OF UTERUS  2000  . JOINT REPLACEMENT     bilateral hips  . KNEE ARTHROSCOPY Right 11/18/2016   Procedure: RIGHT KNEE ARTHROSCOPY WITH DEBRIDEMENT;  Surgeon: CMcarthur Rossetti MD;  [facility] [employer] [employer]  Service: Orthopedics;  Laterality: Right;  . LAPAROSCOPIC GASTRIC BANDING  2009  . LASIK    . POSTERIOR FUSION LUMBAR SPINE  2000's    "nerve problems"  . POSTERIOR FUSION LUMBAR SPINE  2000's  . TEE WITHOUT CARDIOVERSION N/A 12/29/2012   Procedure: TRANSESOPHAGEAL ECHOCARDIOGRAM (TEE);  Surgeon: Peter M JMartinique MD;  [place]  Service: Cardiovascular;  Laterality: N/A;  . THeber . TOTAL HIP ARTHROPLASTY  ~2009; 2010   right; left  . TUBAL LIGATION  1980  . VAGINAL HYSTERECTOMY  2000    Outpatient Encounter Medications as of 12/29/2017  Medication Sig  . amitriptyline (ELAVIL) 10 MG tablet Take 1 tablet (10 mg total) by mouth at bedtime.  . cetirizine (ZYRTEC) 10 MG tablet Take 10 mg by mouth daily.  . Cholecalciferol (VITAMIN D) 2000 UNITS tablet Take 2,000 Units by mouth daily.  . clonazePAM (KLONOPIN) 0.5 MG tablet Take 1/2 to 1 tablet by mouth two times daily  . diclofenac sodium (VOLTAREN) 1 % GEL Apply 2 g topically 4 (four) times daily.  .Marland Kitchendiltiazem (CARDIZEM CD) 120 MG 24 hr capsule Take 1240mby mouth daily  . dronedarone (MULTAQ) 400 MG tablet Take 400 mg by mouth 2 (two) times daily with a meal.  . ELIQUIS 5 MG TABS tablet TAKE 1 TABLET BY MOUTH TWICE A DAY....NEED OFFICE VISIT BEFORE ANY MORE REFILLS  . furosemide (LASIX) 40 MG tablet TAKE 2 TABLETS BY MOUTH EVERY MORNING  . guaiFENesin (MUCINEX) 600 MG 12 hr tablet Take 600 mg by mouth 2 (two) times daily as needed. For allergies.  . Marland Kitchenevothyroxine (SYNTHROID, LEVOTHROID) 75 MCG tablet Take 1 tablet (75 mcg total) by mouth daily before breakfast.  . losartan (COZAAR) 100 MG tablet TAKE 1 TABLET BY MOUTH  DAILY  . Magnesium 250 MG TABS Take 250 mg by mouth 2 (two) times daily.  . metoprolol tartrate (LOPRESSOR) 25 MG tablet Take 25 mg by mouth 2 (two) times daily.   . predniSONE (DELTASONE) 5 MG tablet TAKE AS TAPERING DOSE SCHEDULE PER DR Uzair Godley (Patient taking differently: TAKE 52m39mlternates with 82m72mD)  . traMADol (ULTRAM) 50 MG tablet Take 1 tablet (50 mg total) by mouth 3 (three) times daily.  .  [DISCONTINUED] acetaminophen-codeine (TYLENOL #3) 300-30 MG tablet Take 1 tablet by mouth every 6 (six) hours as needed. for pain  . [DISCONTINUED] clonazePAM (KLONOPIN) 0.5 MG tablet TAKE 1/2 TO 1 TABLET TWICE A DAY  . [DISCONTINUED] meloxicam (MOBIC) 15 MG tablet TAKE 1 TABLET BY MOUTH EVERY DAY  . [DISCONTINUED] traMADol (ULTRAM) 50 MG tablet Take 1 tablet (50 mg total) by mouth 3 (three) times daily.  No facility-administered encounter medications on file as of 12/29/2017.     Allergies  Allergen Reactions  . Cephalexin Hives and Other (See Comments)    "throat started closing up"  . Rocephin [Ceftriaxone Sodium In Dextrose] Anaphylaxis  . Penicillins Rash    Has patient had a PCN reaction causing immediate rash, facial/tongue/throat swelling, SOB or lightheadedness with hypotension: no Has patient had a PCN reaction causing severe rash involving mucus membranes or skin necrosis: no Has patient had a PCN reaction that required hospitalization no Has patient had a PCN reaction occurring within the last 10 years: no If all of the above answers are "NO", then may proceed with Cephalosporin use.  . Flecainide Acetate Other (See Comments)    "couldn't take it"  . Moxifloxacin     Does not remember the reaction  . Sulfonamide Derivatives     Does not remember reaction ; "was so young when I had reaction to it"    Immunization History  Administered Date(s) Administered  . Influenza Split 07/13/2016  . Influenza,inj,Quad PF,6+ Mos 06/30/2017  . Influenza-Unspecified 11/04/2012, 08/24/2014  . Zoster 03/24/2013    Current Medications, Allergies, Past Medical History, Past Surgical History, Family History, and Social History were reviewed in [employer] record.   Review of Systems            All symptoms NEG except where BOLDED >>  Constitutional:  F/C/S, fatigue, anorexia, unexpected weight change. HEENT:  HA, visual changes, hearing loss, earache, nasal  symptoms, sore throat, mouth sores, hoarseness. Resp:  cough, sputum, hemoptysis; SOB, tightness, wheezing. Cardio:  CP, palpit, DOE, orthopnea, edema. GI:  N/V/D/C, blood in stool; reflux, abd pain, distention, gas. GU:  dysuria, freq, urgency, hematuria, flank pain, voiding difficulty. MS:  joint pain, swelling, tenderness, decr ROM; neck pain, back pain, etc. Neuro:  HA, tremors, seizures, dizziness, syncope, weakness, numbness, gait abn. Skin:  suspicious lesions or skin rash. Heme:  adenopathy, bruising, bleeding. Psyche:  confusion, agitation, sleep disturbance, hallucinations, anxiety, depression suicidal.   Objective:   Physical Exam      Vital Signs:  Reviewed...   General:  WD, morbidly obese, 67 y/o WF in NAD; alert & oriented; pleasant & cooperative... HEENT:  [place]/AT; Conjunctiva- pink, Sclera- nonicteric, EOM-wnl, PERRLA, EACs-clear, TMs-wnl; NOSE-clear; THROAT-clear & wnl.  Neck:  Supple w/ decr ROM; no JVD; normal carotid impulses w/o bruits; no thyromegaly or nodules palpated; no lymphadenopathy.  Chest:  decr BS bilat at bases, but clear without wheezes, rales, or rhonchi heard. Heart:  Regular Rhythm; gr1/6SEM without rubs or gallops detected. Abdomen:  Obese, soft & nontender- no guarding or rebound; normal bowel sounds; no organomegaly or masses palpated. Ext:  decrROM; +arthritic changes; no varicose veins, +venous insuffic, lymphedema changes;  Pulses intact w/o bruits. Neuro:  No focal neuro deficits, +gait abnormality & balance fair... Derm:  No lesions noted; no rash etc. Lymph:  No cervical, supraclavicular, axillary, or inguinal adenopathy palpated.   Assessment:      IMP >>     Severe DYSPNEA>  She reports sl improved w/ wt loss so far & home oxygen therapy...    Hx cryptogenic organizing pneumonia> presented 4/18 w/ dyspnea & change in CXR=> His-res CT Chest suggesting COP & improved w/ Pred Rx...    Nocturnal & exercise hypoxemia>  She desats to 86% on  RA w/ min walking & signif O2 desats Qhs despite CPAP=> O2 incr to 3L/min bled into machine.    Pulmonary HTN>  2DEcho in [place] by DrFitagerald 5/16 indicated PAsys~80 (in 2014 it was ~34)...    OSA>  Initial sleep study by DrPickard & CPAP prescribed & followed by him; CPAP download from spring2016 showed AHI=1.5 on CPAP 11...    Morbid Obesity> Wt=356 lbs in Jun2016 & she is down to 311 lbs today; Franki Monte is planning to convert her Lap band to Sleeve gastrectomy soon...    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    HBP>  On Metop25Bid, CardizenCD120, Losar100, Lasix80 & BP=128/74...    PAF>  On Eliquis5Bid & Multaq400; she has had numerous ablation procedures; now holding NSR on the amio...    Ven Insuffic/ lymphedema>  On Lasix80 & the low salt diet...    Hypothyroid>  On Synthroid100 w/ TSH followed by DrPickard...  PLAN >>   01/29/17>   Heather [name] has a complex medical picture w/ underlying Morbid Obesity, Restrictive pulm dis, chronic hypoxemic resp failure on O2, & OSA on CPAP=> causing secondary pulm HTN (Prob WHO Group 3);  On effective CPAP therapy & 24/7 O2 w/ resolution of her hypoxemia- her PA pressure has improved from ~80 (02/2015) to ~65 at present, unfortunately she was turned down by her insurance company for further Bariatric surg as I still believe that a successful gastric sleeve procedure could prove life saving!       Now she presents w/ a superimposed acute dyspnea w/ CXR abn thought to suggest early CHF & a BNP=250 + 2DEcho w/ good LVF but Gr2DD, placed on Lasix w/ only min improvement;  Note recent Ortho eval w/ very high inflamm markers- treated w/ synvisc;  Further Pulm eval reveals Hi-res CT Chest suggesting COP & collagen vasc screen w/ a few unexplained random marker abn (eg- neg ANA & ANCA but pos RNP, plus the ACE level is sl elev & inflamm markers remain very high (sed=122);  She was placed on empiric trial of PRED  pending some of our studies and she's had a good clinical response w/ improvement in her breathing (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  We will have her ret in 20moto assess response & recheck labs. 03/02/17>   CMarviais improved- breathing better on the Pred & inflamm markers are much better;  This favors the Dx of COP & we will continue to wean the Pred from '20mg'$ /d now to 20-10 qod for 3wks and then '10mg'$ /d til return in 6wks;  She is admonished to get on a vigorous low carb, low fat diet & get her weight down, again I am in favor of Bariatric surg as a life-saving procedure for her- she still has severe secondary pulmHTN on treatment OSA, morbid obesity, restrictive pulm dis. 04/27/17>   We decided to continue the Pred at 20-10 Qod & incr her Lasix from '40mg'$ /d to '80mg'$  Qam w/ ROV recheck in 120month8/16/18>   CyCretandicates to me that she is hoping to get her sleeve gastrectomy surg in Oct/Nov and needs to be on a low dose of Pred ~'5mg'$ /d or less for this to happen;  She is improved today w/ the Pred 20-10 qod & Lasix80/d => we decided to cut the Pred to '10mg'$ /d and keep the Lasix at 80Qam for now w/ rov 65m22mor recheck leading up to her bariatric surg. 06/30/17>   We decided to cut the Pred from '10mg'$ /d to '10mg'$  alt w/ '5mg'$  QOD;  Continue Lasix '80mg'$  Qam;  She knows to  elim all salt/sodium from her diet & we will add pneumatic compression device for her leg edema;  We will continue to check pt monthly prior to her Bariatric surg. 08/11/17>   It seems there is no immed rush to decr her Pred to the '5mg'$ /d level required for surg since she is required to participate in a 98moprogram of supervised wt loss w/ nutritional counseling etc via [facility];  Therefore we will continue the '10mg'$  alt w/ '5mg'$  QOD dose for now;  We wrote for TRAMADOL50 + tylenol for her arthritis pain;  She will continue w/ her O2, continue her meds, and work on weight reduction 09/30/17>   OK to wean Pred to '5mg'$  daily as we discussed;  Continue  same CPAP & face-to-face note below;  Continue diet, nutritional counseling from [facility]- moving toward bariatric surg date in 2019;  Still awaiting pneumatic compression device to aide in edema management so we can cut down on her Lasix dose later 12/29/17>   Unfortunately the est PA pressure is increased over the last yr- she is using the CPAP regularly, using her oxygen regularly, taking her meds faithfully, etc; hopefully the sleeve gastrectomy planned for ?June will result in the needed wt reduction w/ subseq improvement in her PulmHTN;  We decided to wean the Pred down to '5mg'$  Qam, anxiously awaiting the bariatric surg planned for June...    Plan:     Patient's Medications  New Prescriptions   No medications on file  Previous Medications   AMITRIPTYLINE (ELAVIL) 10 MG TABLET    Take 1 tablet (10 mg total) by mouth at bedtime.   CETIRIZINE (ZYRTEC) 10 MG TABLET    Take 10 mg by mouth daily.   CHOLECALCIFEROL (VITAMIN D) 2000 UNITS TABLET    Take 2,000 Units by mouth daily.   CLONAZEPAM (KLONOPIN) 0.5 MG TABLET    Take 1/2 to 1 tablet by mouth two times daily   DICLOFENAC SODIUM (VOLTAREN) 1 % GEL    Apply 2 g topically 4 (four) times daily.   DILTIAZEM (CARDIZEM CD) 120 MG 24 HR CAPSULE    Take '120mg'$  by mouth daily   DRONEDARONE (MULTAQ) 400 MG TABLET    Take 400 mg by mouth 2 (two) times daily with a meal.   ELIQUIS 5 MG TABS TABLET    TAKE 1 TABLET BY MOUTH TWICE A DAY....NEED OFFICE VISIT BEFORE ANY MORE REFILLS   FUROSEMIDE (LASIX) 40 MG TABLET    TAKE 2 TABLETS BY MOUTH EVERY MORNING   GUAIFENESIN (MUCINEX) 600 MG 12 HR TABLET    Take 600 mg by mouth 2 (two) times daily as needed. For allergies.   LEVOTHYROXINE (SYNTHROID, LEVOTHROID) 75 MCG TABLET    Take 1 tablet (75 mcg total) by mouth daily before breakfast.   LOSARTAN (COZAAR) 100 MG TABLET    TAKE 1 TABLET BY MOUTH  DAILY   MAGNESIUM 250 MG TABS    Take 250 mg by mouth 2 (two) times daily.   METOPROLOL TARTRATE (LOPRESSOR) 25 MG  TABLET    Take 25 mg by mouth 2 (two) times daily.    PREDNISONE (DELTASONE) 5 MG TABLET    TAKE AS TAPERING DOSE SCHEDULE PER DR Janecia Palau  Modified Medications   Modified Medication Previous Medication   TRAMADOL (ULTRAM) 50 MG TABLET traMADol (ULTRAM) 50 MG tablet      Take 1 tablet (50 mg total) by mouth 3 (three) times daily.    Take 1 tablet (50 mg total)  by mouth 3 (three) times daily.  Discontinued Medications   ACETAMINOPHEN-CODEINE (TYLENOL #3) 300-30 MG TABLET    Take 1 tablet by mouth every 6 (six) hours as needed. for pain   CLONAZEPAM (KLONOPIN) 0.5 MG TABLET    TAKE 1/2 TO 1 TABLET TWICE A DAY   MELOXICAM (MOBIC) 15 MG TABLET    TAKE 1 TABLET BY MOUTH EVERY DAY

## 2018-01-02 ENCOUNTER — Other Ambulatory Visit: Payer: Self-pay | Admitting: Pulmonary Disease

## 2018-01-06 ENCOUNTER — Other Ambulatory Visit: Payer: Self-pay

## 2018-01-06 ENCOUNTER — Ambulatory Visit (HOSPITAL_COMMUNITY): Payer: Medicare Other | Attending: Cardiology

## 2018-01-06 DIAGNOSIS — I272 Pulmonary hypertension, unspecified: Secondary | ICD-10-CM | POA: Insufficient documentation

## 2018-01-06 DIAGNOSIS — I4891 Unspecified atrial fibrillation: Secondary | ICD-10-CM | POA: Insufficient documentation

## 2018-01-06 DIAGNOSIS — I081 Rheumatic disorders of both mitral and tricuspid valves: Secondary | ICD-10-CM | POA: Insufficient documentation

## 2018-01-06 DIAGNOSIS — I4892 Unspecified atrial flutter: Secondary | ICD-10-CM | POA: Diagnosis not present

## 2018-01-06 DIAGNOSIS — I110 Hypertensive heart disease with heart failure: Secondary | ICD-10-CM | POA: Diagnosis not present

## 2018-01-06 DIAGNOSIS — I509 Heart failure, unspecified: Secondary | ICD-10-CM | POA: Insufficient documentation

## 2018-01-06 DIAGNOSIS — G4733 Obstructive sleep apnea (adult) (pediatric): Secondary | ICD-10-CM | POA: Diagnosis not present

## 2018-01-21 ENCOUNTER — Encounter: Payer: Self-pay | Admitting: Registered"

## 2018-01-21 ENCOUNTER — Encounter: Payer: Medicare Other | Attending: Surgery | Admitting: Registered"

## 2018-01-21 ENCOUNTER — Ambulatory Visit: Payer: Self-pay | Admitting: Registered"

## 2018-01-21 DIAGNOSIS — E669 Obesity, unspecified: Secondary | ICD-10-CM

## 2018-01-21 DIAGNOSIS — Z713 Dietary counseling and surveillance: Secondary | ICD-10-CM | POA: Diagnosis present

## 2018-01-21 NOTE — Progress Notes (Signed)
Appt start time: 11:00 end time: 11:15  Assessment: 5th SWL Appointment.   Start Wt at [facility]: 322.5 Wt: 318.4 BMI: 53.81   Pt is very weight-focused.  Pt arrives having maintained weight from previous visit. Pt states she found a low carb bread, Sola that she enjoys sometimes in order to not feel deprived of bread.  Pt states she has recovered from flu and now eating 3 meals a day again. Pt states she is increasing physical activity with arm exercises, but wants to do more lower body activities because that is where she carries most of her weight. Pt has not mentioned weight and how her numbers are changing in this appointment; mindset may be shifting to be less weight-focused. Pt is doing great with behavioral changes.   Pt states she has been tracking her food and drink intake with her new notebook. Pt states she has become more impatient and easily frustrated, then prays to God and that helps her. Pt states she is doing well with chewing as well as not drinking 15 minutes before, not while eating, and waiting 30 minutes after eating. Pt states she needs to shift her thinking into not focusing on the scale/numbers and more about health.   Pt states she drinks about 90 ounces a day. Pt states she has eliminated soda. Pt states surgeon wants her to be down although this information was not included in her referral then states he wants her to at least maintain weight since she is on prednisone.   Pt states she has a lot of knee pain treated with medication (Tramadol) therefore limits physical activity.  Pt states she has a lot of fluid. Pt is frustrated that she continues to gain weight while taking prednisone; also hypothyroidism. Pt expresses frustration also with recently being turned down with insurance related to compression tights for legs and also that this bariatric process has become longer.  Pt states she cannot take calcium therefore does not take MVI, has started taking  iron.   MEDICATIONS: See list   DIETARY INTAKE:  24-hr recall:  First Meal: 2 eggs, bacon/sausage  Snack: cheese stick  Second Meal: meat, celery or salad  Snack: sugar-free cookie or fruit Third Meal: meat, vegetables Snack: fruit Beverages: water, decaf tea, green tea, decaf coffee, Propel  Usual physical activity: arm exercises 15-20 min 4 days/week  Diet to Follow: 1600 calories 180 g carbohydrates 120 g protein 44 g fat  Preferred Learning Style:   No preference indicated   Learning Readiness:   Contemplating  Ready  Change in progress     Nutritional Diagnosis:  [place]-3.3 Overweight/obesity related to past poor dietary habits and physical inactivity as evidenced by patient w/ planned sleeve gastrectomy surgery following dietary guidelines for continued weight loss.    Intervention:  Nutrition counseling for upcoming Bariatric Surgery.  Goals:  - Check into [facility] exercise classes at [facility] for variety with physical activity.  - Add in 1 more day of physical activity, 15-20 min.  - Keep up the great work with habits already established.   Teaching Method Utilized:  Visual Auditory Hands on  Handouts given during visit include:  none  Barriers to learning/adherence to lifestyle change: contemplative stage of change  Demonstrated degree of understanding via:  Teach Back   Monitoring/Evaluation:  Dietary intake, exercise, and body weight in 1 month(s).

## 2018-01-21 NOTE — Patient Instructions (Addendum)
-   [facility] for variety with physical activity.   - Add in 1 more day of physical activity, 15-20 min.   - Keep up the great work with habits already established.

## 2018-01-28 ENCOUNTER — Other Ambulatory Visit: Payer: Self-pay | Admitting: Family Medicine

## 2018-02-08 ENCOUNTER — Telehealth: Payer: Self-pay | Admitting: Pulmonary Disease

## 2018-02-08 MED ORDER — HYDROCODONE-HOMATROPINE 5-1.5 MG/5ML PO SYRP: 5.0000 mL | ORAL_SOLUTION | Freq: Four times a day (QID) | ORAL | 0 refills | Status: DC | PRN

## 2018-02-08 NOTE — Telephone Encounter (Signed)
Per Robynn Pane it is ok for [place] to do the conversion. Medication sent in for 7 days. 140 ml with 5 ml every 6 hours as needed for cough.

## 2018-02-08 NOTE — Telephone Encounter (Signed)
Called and spoke with patients pharmacy. We are only allowed to send in a weeks worth of this medication. The bottle is 5 ml instead of ounces.   SN please advise on dosage and quantity thank you.

## 2018-02-08 NOTE — Telephone Encounter (Signed)
Per SN- Continue Prednisone and Mucinex. No cough med on list, so call in Hycodan 8oz, 1 tsp PO Q6 hours prn cough.

## 2018-02-08 NOTE — Telephone Encounter (Signed)
Called and spoke with patient. She states that for the past few days she has had a severe cough. Some congestion. Denies fever and body aches. Patient states that she ran out of her cough medicine last night.    SN please advise, thanks.    Current Outpatient Medications on File Prior to Visit  Medication Sig Dispense Refill  . amitriptyline (ELAVIL) 10 MG tablet TAKE 1 TABLET (10 MG TOTAL) BY MOUTH AT BEDTIME. 90 tablet 3  . cetirizine (ZYRTEC) 10 MG tablet Take 10 mg by mouth daily.    . Cholecalciferol (VITAMIN D) 2000 UNITS tablet Take 2,000 Units by mouth daily.    . clonazePAM (KLONOPIN) 0.5 MG tablet Take 1/2 to 1 tablet by mouth two times daily 60 tablet 3  . diclofenac sodium (VOLTAREN) 1 % GEL Apply 2 g topically 4 (four) times daily. 100 g 3  . diltiazem (CARDIZEM CD) 120 MG 24 hr capsule Take  by mouth daily    . dronedarone (MULTAQ) 400 MG tablet Take 400 mg by mouth 2 (two) times daily with a meal.    . ELIQUIS 5 MG TABS tablet TAKE 1 TABLET BY MOUTH TWICE A DAY....NEED OFFICE VISIT BEFORE ANY MORE REFILLS 60 tablet 6  . furosemide (LASIX) 40 MG tablet TAKE 2 TABLETS BY MOUTH EVERY MORNING 60 tablet 5  . guaiFENesin (MUCINEX) 600 MG 12 hr tablet Take 600 mg by mouth 2 (two) times daily as needed. For allergies.    Marland Kitchen levothyroxine (SYNTHROID, LEVOTHROID) 75 MCG tablet Take 1 tablet (75 mcg total) by mouth daily before breakfast. 30 tablet 11  . losartan (COZAAR) 100 MG tablet TAKE 1 TABLET BY MOUTH  DAILY 90 tablet 1  . Magnesium 250 MG TABS Take 250 mg by mouth 2 (two) times daily.    . metoprolol tartrate (LOPRESSOR) 25 MG tablet Take 25 mg by mouth 2 (two) times daily.   11  . predniSONE (DELTASONE) 5 MG tablet  every AM 100 tablet 1  . traMADol (ULTRAM) 50 MG tablet Take 1 tablet (50 mg total) by mouth 3 (three) times daily. 50 tablet 3   No current facility-administered medications on file prior to visit.    Allergies  Allergen Reactions  . Cephalexin Hives and  Other (See Comments)    "throat started closing up"  . Rocephin [Ceftriaxone Sodium In Dextrose] Anaphylaxis  . Penicillins Rash    Has patient had a PCN reaction causing immediate rash, facial/tongue/throat swelling, SOB or lightheadedness with hypotension: no Has patient had a PCN reaction causing severe rash involving mucus membranes or skin necrosis: no Has patient had a PCN reaction that required hospitalization no Has patient had a PCN reaction occurring within the last 10 years: no If all of the above answers are "NO", then may proceed with Cephalosporin use.  . Flecainide Acetate Other (See Comments)    "couldn't take it"  . Moxifloxacin     Does not remember the reaction  . Sulfonamide Derivatives     Does not remember reaction ; "was so young when I had reaction to it"

## 2018-02-17 IMAGING — CT CT CHEST HIGH RESOLUTION W/O CM
2 of 5 series · 14 of 36 positions shown, 17 images · non-contrast
Comparison: 01/19/2017 chest radiograph. 03/06/2009 chest CT
angiogram.

CLINICAL DATA: Worsening shortness of breath and wheezing,
reportedly slightly improved on prednisone. History of pulmonary
hypertension. Evaluate for interstitial lung disease.

EXAM:
CT CHEST WITHOUT CONTRAST
TECHNIQUE: Multidetector CT imaging of the chest was performed following the
standard protocol without intravenous contrast. High resolution
imaging of the lungs, as well as inspiratory and expiratory imaging,
was performed.

[Series 2: high resolution · axial · 0.63mm/px · z∈[-340,-48]mm · 11 of 162 slices shown, 14 images]
[im 8/162  mediastinal]
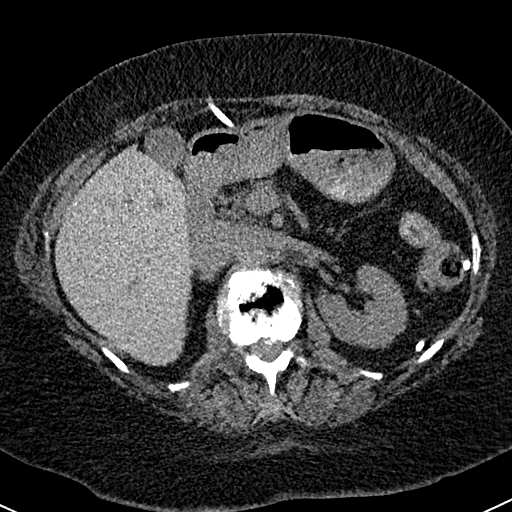
[im 8/162  lung]
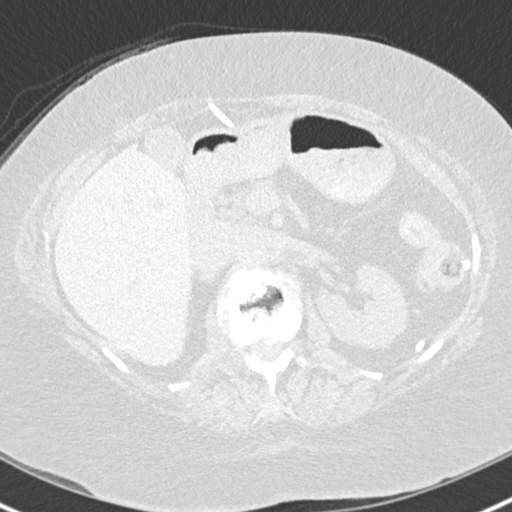
[im 24/162  lung]
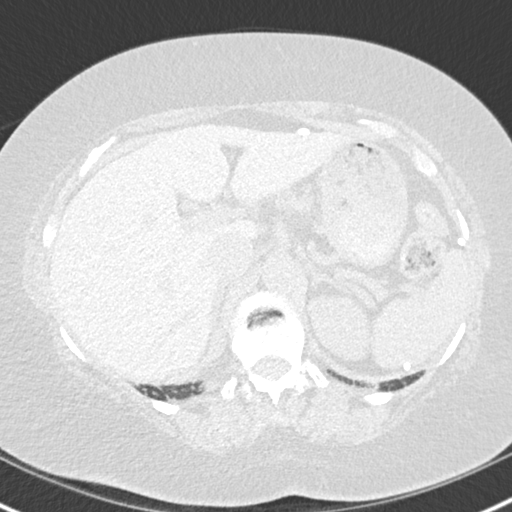
[im 39/162  lung]
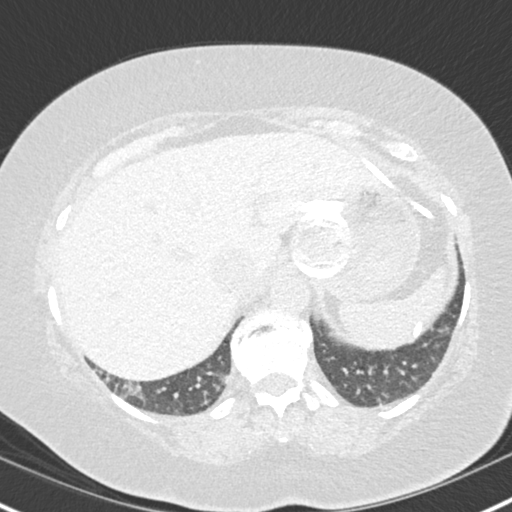
[im 54/162  lung]
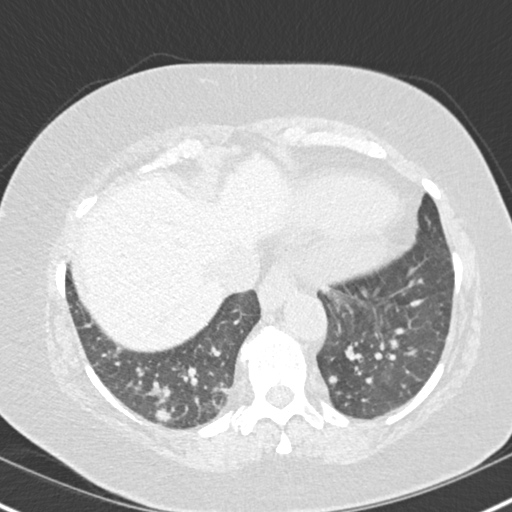
[im 70/162  mediastinal]
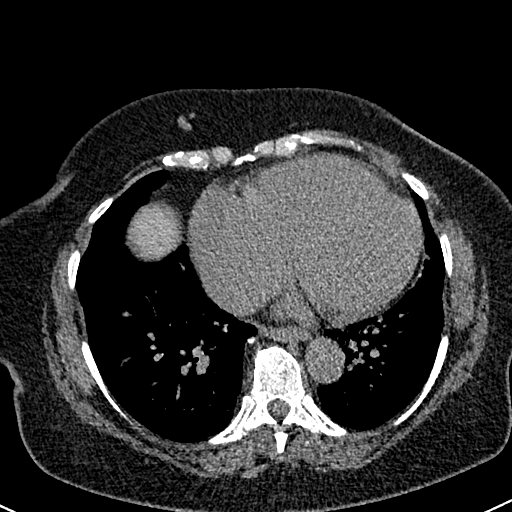
[im 70/162  lung]
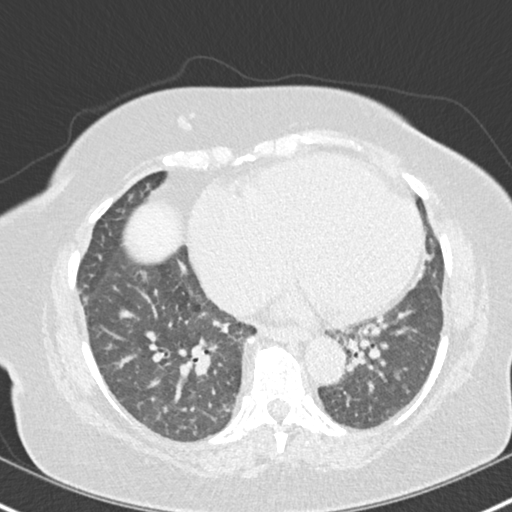
[im 85/162  lung]
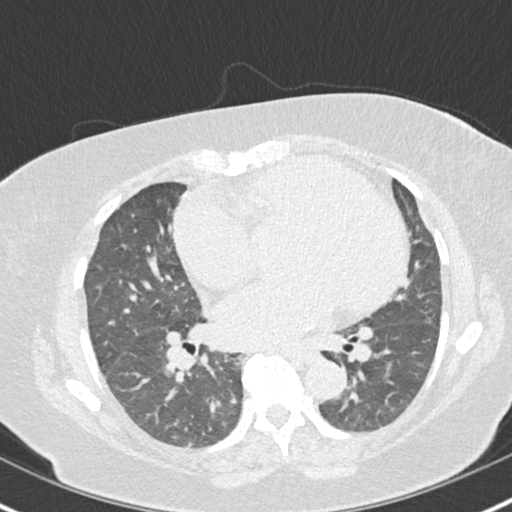
[im 93/162  lung]
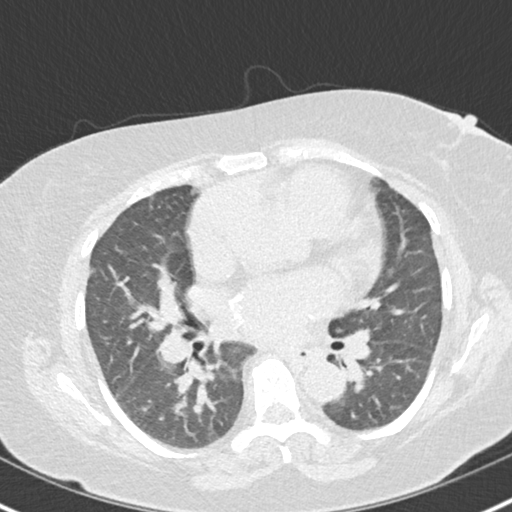
[im 108/162  lung]
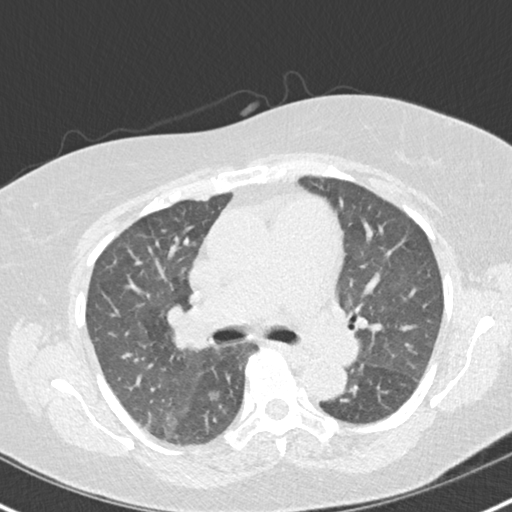
[im 123/162  mediastinal]
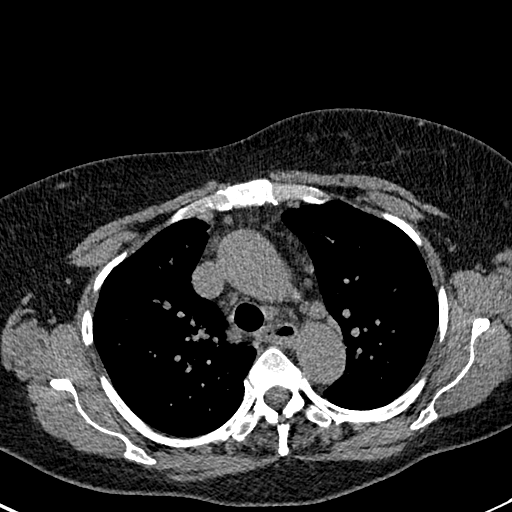
[im 123/162  lung]
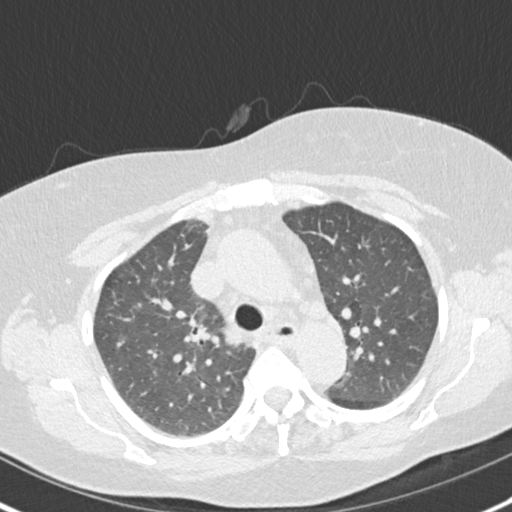
[im 139/162  lung]
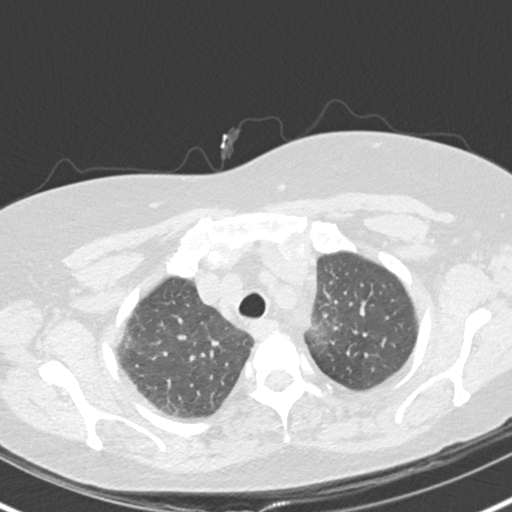
[im 154/162  lung]
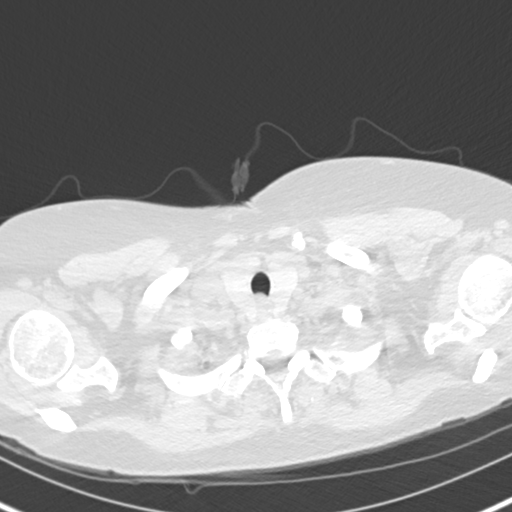

[Series 8: coronal · coronal · 0.62mm/px · 3 of 116 slices shown]
[im 24/116  lung]
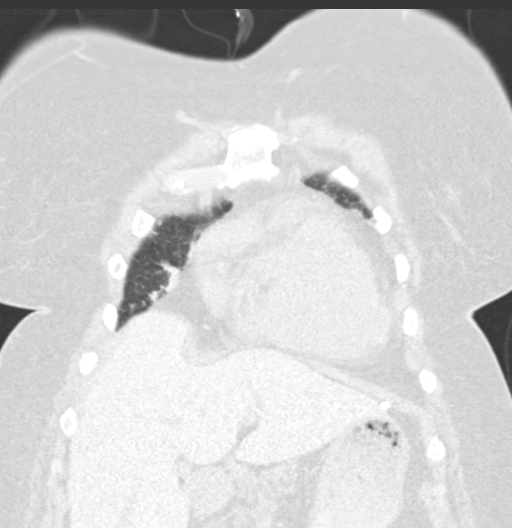
[im 47/116  lung]
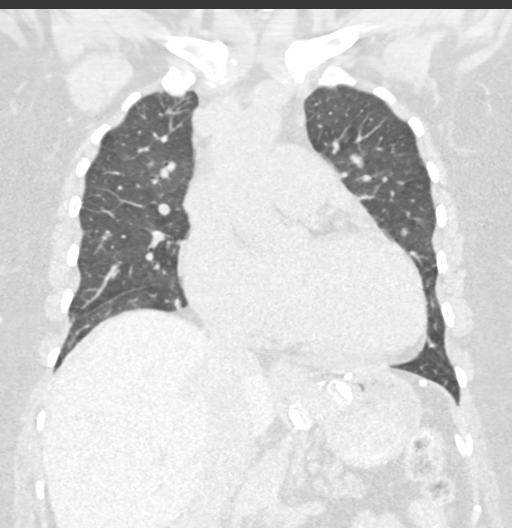
[im 70/116  lung]
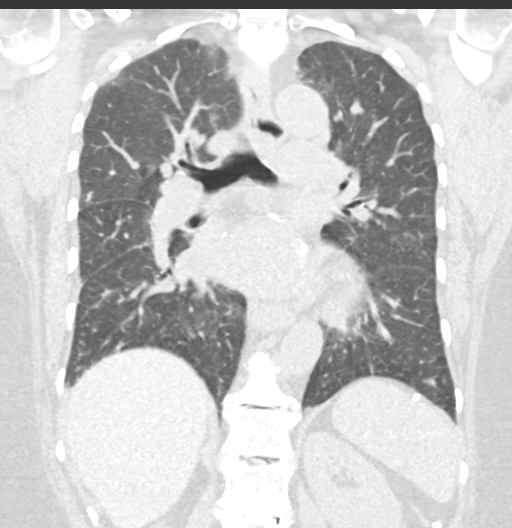

[14 of 36 positions shown; findings below may reference images not displayed]

FINDINGS: Cardiovascular: Cardiomegaly. No significant pericardial
fluid/thickening. Aortic atherosclerosis. Ectatic 4.0 cm ascending
thoracic aorta, not appreciably changed since 03/06/2009. Dilated
main pulmonary artery (3.8 cm diameter), increased from 3.4 cm on
03/06/2009.

Mediastinum/Nodes: No discrete thyroid nodules. Unremarkable
esophagus. No axillary adenopathy. Mildly enlarged 1.5 cm right
paratracheal node (series 2/ image 46), new since 03/06/2009. Newly
mildly enlarged 1.3 cm AP window node (series 2/ image 42). No
additional pathologically enlarged mediastinal or gross hilar nodes
on this noncontrast scan.

Lungs/Pleura: No pneumothorax. No pleural effusion. There are
numerous (greater than 10) poorly marginated subsolid nodules
throughout both lungs, most prominent in the lower lobes
bilaterally, with a generally centrilobular distribution, all new
since 03/06/2009, for example a subsolid 1.2 x 1.1 cm dependent
basilar right lower lobe nodule (series 3/ image 110) and a 1.0 x
0.7 cm medial left lower lobe subsolid nodule (series 3/ image 107).
There is mild interlobular septal thickening throughout both lungs.
No significant air trapping on the expiration sequence. No
significant regions of subpleural reticulation, traction
bronchiectasis, parenchymal banding, architectural distortion or
frank honeycombing.

Upper abdomen: Gastric band appears well positioned in the gastric
cardia just below the esophagogastric junction. Visualized tubing
connecting the gastric band to the subcutaneous port in the ventral
right abdominal wall appears intact.

Musculoskeletal: No aggressive appearing focal osseous lesions.
Marked thoracic spondylosis.
IMPRESSION: 1. Cardiomegaly. Mild diffuse interlobular septal thickening
suggests mild pulmonary edema.
2. Numerous nonspecific poorly marginated subsolid pulmonary nodules
in a generally centrilobular distribution throughout both lungs,
most prominent in the lower lobes, all new since 03/06/2009 chest
CT. Differential includes infection/bronchopneumonia or atypical
inflammatory etiologies such as cryptogenic organizing pneumonia
(given the reported clinical improvement on steroids). Non-contrast
chest CT at 3-6 months is recommended. If nodules persist,
subsequent management will be based upon the most suspicious
nodule(s). This recommendation follows the consensus statement:
Guidelines for Management of Incidental Pulmonary Nodules Detected
[DATE].
3. Otherwise no evidence of underlying interstitial lung disease.
4. Aortic atherosclerosis. Stable 4.0 cm ectatic ascending thoracic
aorta. Recommend annual imaging followup by CTA or MRA. This
recommendation follows 4383
ACCF/AHA/AATS/ACR/ASA/SCA/METZ/GAMRA/LOZANO/ABD LOUHAB Guidelines for the
Diagnosis and Management of Patients with Thoracic Aortic Disease.
Circulation. 4383; 121: [id number].
5. Dilated main pulmonary artery, compatible with the known history
of pulmonary arterial hypertension.

## 2018-02-22 ENCOUNTER — Encounter: Payer: Self-pay | Admitting: Registered"

## 2018-02-22 ENCOUNTER — Encounter: Payer: Medicare Other | Attending: Surgery | Admitting: Registered"

## 2018-02-22 DIAGNOSIS — Z713 Dietary counseling and surveillance: Secondary | ICD-10-CM | POA: Insufficient documentation

## 2018-02-22 DIAGNOSIS — E669 Obesity, unspecified: Secondary | ICD-10-CM

## 2018-02-22 NOTE — Patient Instructions (Signed)
Keep up the great work!

## 2018-02-22 NOTE — Progress Notes (Signed)
Appt start time: 10:55 end time: 11:15  Assessment: 6th SWL Appointment.   Start Wt at [facility]: 322.5 Wt: 313.6 BMI: 52.95   Pt is very weight-focused.  Pt arrives having lost about 5 lbs from previous visit. Pt states she wakes up some days and feels bad. Pt states this happens about once every 2 weeks. Pt states she plans to utilize support group this time, didn't do it last time with her LAGB. Pt states she has not had a chance to look into water exercise classes at local [facility]. Pt states she likes Premier Protein (chocolate, vanilla, bananas and cream, strawberry) and Atkins shakes. Pt states she has not been able to workout 5 days/week, does well with 4 times/week.   Pt states she has been tracking her food and drink intake with her new notebook. Pt states she is doing well with chewing as well as not drinking 15 minutes before, not while eating, and waiting 30 minutes after eating.   Pt states she drinks about 90 ounces a day. Pt states she has eliminated soda. Pt states surgeon wants her to be down although this information was not included in her referral then states he wants her to at least maintain weight since she is on prednisone.   Pt states she has a lot of knee pain treated with medication (Tramadol) therefore limits physical activity.  Pt is frustrated that she continues to gain weight while taking prednisone; also hypothyroidism.   Pt states she cannot take calcium therefore does not take MVI, has started taking iron.   MEDICATIONS: See list   DIETARY INTAKE:  24-hr recall:  First Meal: 2 eggs, bacon/sausage  Snack: cheese stick  Second Meal: meat, celery or salad  Snack: sugar-free cookie or fruit Third Meal: meat, vegetables Snack: milk Beverages: water, decaf tea, green tea, decaf coffee, Propel  Usual physical activity: arm exercises 15-20 min 4 days/week  Diet to Follow: 1600 calories 180 g carbohydrates 120 g protein 44 g fat  Preferred Learning Style:    No preference indicated   Learning Readiness:   Contemplating  Ready  Change in progress     Nutritional Diagnosis:  [place]-3.3 Overweight/obesity related to past poor dietary habits and physical inactivity as evidenced by patient w/ planned sleeve gastrectomy surgery following dietary guidelines for continued weight loss.    Intervention:  Nutrition counseling for upcoming Bariatric Surgery.  Goals:  - Keep up the great work!  Teaching Method Utilized:  Visual Auditory Hands on  Handouts given during visit include:  none  Barriers to learning/adherence to lifestyle change: contemplative stage of change  Demonstrated degree of understanding via:  Teach Back   Monitoring/Evaluation:  Dietary intake, exercise, and body weight prn.

## 2018-03-05 ENCOUNTER — Telehealth: Payer: Self-pay | Admitting: Pulmonary Disease

## 2018-03-05 MED ORDER — TRAMADOL HCL 50 MG PO TABS: 50.0000 mg | ORAL_TABLET | Freq: Three times a day (TID) | ORAL | 3 refills | Status: DC

## 2018-03-05 NOTE — Telephone Encounter (Signed)
Per SN- ok for Tramadol #50 with 3 refills.  Called into preferred pharmacy, [facility] on [address]  Patient notified of refill.  Nothing further needed.

## 2018-03-15 ENCOUNTER — Encounter: Payer: Medicare Other | Attending: Surgery | Admitting: Registered"

## 2018-03-15 DIAGNOSIS — E669 Obesity, unspecified: Secondary | ICD-10-CM

## 2018-03-15 DIAGNOSIS — Z713 Dietary counseling and surveillance: Secondary | ICD-10-CM | POA: Insufficient documentation

## 2018-03-16 ENCOUNTER — Ambulatory Visit (INDEPENDENT_AMBULATORY_CARE_PROVIDER_SITE_OTHER): Payer: Medicare Other | Admitting: Family Medicine

## 2018-03-16 ENCOUNTER — Ambulatory Visit: Payer: Medicare Other | Admitting: Psychology

## 2018-03-16 ENCOUNTER — Other Ambulatory Visit: Payer: Self-pay

## 2018-03-16 ENCOUNTER — Encounter: Payer: Self-pay | Admitting: Family Medicine

## 2018-03-16 ENCOUNTER — Telehealth: Payer: Self-pay | Admitting: Pulmonary Disease

## 2018-03-16 ENCOUNTER — Ambulatory Visit
Admission: RE | Admit: 2018-03-16 | Discharge: 2018-03-16 | Disposition: A | Discharge status: Home / Self Care | Payer: Medicare Other | Source: Ambulatory Visit | Attending: Family Medicine | Admitting: Family Medicine

## 2018-03-16 VITALS — BP 126/78 | HR 72 | Temp 98.4°F | Resp 18 | Ht 65.0 in | Wt 298.0 lb

## 2018-03-16 DIAGNOSIS — R0602 Shortness of breath: Secondary | ICD-10-CM

## 2018-03-16 DIAGNOSIS — J0140 Acute pansinusitis, unspecified: Secondary | ICD-10-CM

## 2018-03-16 MED ORDER — ALBUTEROL SULFATE HFA 108 (90 BASE) MCG/ACT IN AERS: 2.0000 | INHALATION_SPRAY | RESPIRATORY_TRACT | 1 refills | Status: DC | PRN

## 2018-03-16 MED ORDER — HYDROCOD POLST-CPM POLST ER 10-8 MG/5ML PO SUER: 5.0000 mL | Freq: Two times a day (BID) | ORAL | 0 refills | Status: DC | PRN

## 2018-03-16 MED ORDER — IPRATROPIUM-ALBUTEROL 0.5-2.5 (3) MG/3ML IN SOLN
3.0000 mL | Freq: Once | RESPIRATORY_TRACT | Status: AC
  Administered 2018-03-16: 3 mL via RESPIRATORY_TRACT

## 2018-03-16 MED ORDER — DOXYCYCLINE HYCLATE 100 MG PO TABS: 100.0000 mg | ORAL_TABLET | Freq: Two times a day (BID) | ORAL | 0 refills | Status: DC

## 2018-03-16 MED ORDER — MONTELUKAST SODIUM 10 MG PO TABS
10.0000 mg | ORAL_TABLET | Freq: Every day | ORAL | 3 refills | Status: DC
Start: 2018-03-16 — End: 2018-07-15

## 2018-03-16 NOTE — Progress Notes (Signed)
  Pre-Operative Nutrition Class:  Appt start time: 8:15 End time:  9:15  Patient was seen on 03/15/2018 for Pre-Operative Bariatric Surgery Education at [facility].   Surgery date: TBD Surgery type: LAGB to Sleeve Start weight at [facility]: 322.5 Weight today: 311.2  Samples given per MNT protocol. Patient educated on appropriate usage: Bariatric Advantage Multivitamin Lot # [id number] Exp: 07/2019  Bariatric Advantage Calcium Citrate Lot # [id number] Exp: 07/19/2018  Renee Pain Protein Shake Lot # [id number] Exp: 07/30/2018  The following the learning objectives were met by the patient during this course:  Identify Pre-Op Dietary Goals and will begin 2 weeks pre-operatively  Identify appropriate sources of fluids and proteins   State protein recommendations and appropriate sources pre and post-operatively  Identify Post-Operative Dietary Goals and will follow for 2 weeks post-operatively  Identify appropriate multivitamin and calcium sources  Describe the need for physical activity post-operatively and will follow MD recommendations  State when to call healthcare provider regarding medication questions or post-operative complications  Handouts given during class include:  Pre-Op Bariatric Surgery Diet Handout  Protein Shake Handout  Post-Op Bariatric Surgery Nutrition Handout  BELT Program Information Flyer  Support Group Information Flyer  [facility] Bariatric Supplements Price List  Follow-Up Plan: Patient will follow-up at [facility] 2 weeks post operatively for diet advancement per MD.

## 2018-03-16 NOTE — Telephone Encounter (Signed)
Left message for Patient to call back. Patient has CXR ordered by Chiquita LothLeisa Tapia-PA, at [facility] today, 03/16/18.

## 2018-03-16 NOTE — Telephone Encounter (Signed)
Patient returned call.  [facility] 03/16/18.  OV with SN 03/29/18, will make SN aware of CXR done today. Nothing further needed at this time.

## 2018-03-16 NOTE — Progress Notes (Signed)
Patient ID: Heather [name], female    DOB: 09/07/1951, 67 y.o.   MRN: [id number]  PCP: Donita Brooks, MD  Chief Complaint  Patient presents with  . Cough    symptoms for 1 week   . chest congestion  . Headache    Subjective:   Heather [name] is a 67 y.o. female, pertinent past medical history of pulmonary hypertension, A. fib, HTN, diastolic CHF, cryptic organizing pneumonia, OSA, restrictive lung disease chronic hypoxemia dependent on oxygen, 2 L via nasal cannula during the day and 3 L at night, she presents to clinic with chief complaint of 6 days of constant, gradually worsening chest congestion with productive cough with posttussive emesis, exertional shortness of breath worsening from her baseline with associated chills for the past 2 days.   Her breathing symptoms worsened after onset of URI symptoms.  She is chronically on prednisone, currently on 5 mg daily, has been weaning down the dose for over a year with her pulmonologist..  She is not on any inhalers for her lung disease because they were ineffective.  She has been having difficulty with humid and hot weather and with worsening allergies for the past 1 to 2 months, she was told by her pulmonologist to double her prednisone during days where hot weather is causing worsening breathing, but she has been instructed not to do that regularly.   She does have nasal discharge, congestion, headache, and has been taking a 24-hour over-the-counter antihistamine, but no other treatments or over-the-counter medicines tried prior to arrival.  At her baseline she can ambulate less than a block with 2 L of oxygen before becoming extremely short of breath, in the last several days she is been able to walk around her home without any difficulty but going from parking lot into her church has caused her to be very dyspneic.  She has not had any fevers but usually does not when she becomes ill.  She denies any sinus pain, neck pain, chest pain,  abdominal pain, nausea, diarrhea, back pain.  Denies any hospital admissions in the past 3 months, denies any antibiotic use in the past 3 months.   She denies any sudden weight changes, orthopnea, PND, change in lower extremity edema.  She does have sequential pneumatic devices for her legs to go up to her thighs, has history of lymphedema bilaterally lower extremities, no change from her normal per patient.   Most recent pulmonology office visit note was reviewed, date December 29, 2017, shows chest x-ray did show cardiomegaly and tortuous aorta, lung fields clear without focal abnormality, CPAP from February to March revealed excellent compliance, 2D echo done January 06, 2018 left ventricular ejection fracture 60 to 65%, no regional wall motion abnormality, unable to evaluate diastolic dysfunction of left ventricle, pulmonary artery pressure was worse than prior exam dated April 2018.     Patient Active Problem List   Diagnosis Date Noted  . Lymphedema of both lower extremities 08/24/2017  . Venous insufficiency of both lower extremities 04/27/2017  . Edema 04/27/2017  . Interstitial pneumonia (HCC) 03/02/2017  . Chronic pain of right knee 02/23/2017  . Unilateral primary osteoarthritis, right knee 02/23/2017  . Chronic diastolic congestive heart failure (HCC) 01/29/2017  . Status post arthroscopy of right knee 11/25/2016  . Acute medial meniscal tear, right, initial encounter 11/06/2016  . Pulmonary hypertension (HCC) 03/22/2015  . Sleep apnea   . Atypical atrial flutter (HCC) 11/27/2013  . History of laparoscopic adjustable  gastric banding, APS, 05/09/2008 12/22/2012  . Fatigue 11/23/2012  . Thyroid disease 09/24/2012  . Degenerative disc disease, lumbar 09/24/2012  . Morbid obesity (HCC) 07/30/2012  . Chronic anticoagulation 04/01/2012  . OSA (obstructive sleep apnea) 04/01/2012  . Atrial fibrillation (HCC) 03/24/2012  . Decreased libido 03/01/2012  . Carpal tunnel syndrome   .  Essential hypertension 06/20/2010  . Shortness of breath 12/13/2009     Prior to Admission medications   Medication Sig Start Date End Date Taking? Authorizing Provider  amitriptyline (ELAVIL) 10 MG tablet TAKE 1 TABLET (10 MG TOTAL) BY MOUTH AT BEDTIME. 01/28/18  Yes Donita Brooks, MD  cetirizine (ZYRTEC) 10 MG tablet Take 10 mg by mouth daily.   Yes [provider]  Cholecalciferol (VITAMIN D) 2000 UNITS tablet Take 2,000 Units by mouth daily.   Yes [provider]  clonazePAM (KLONOPIN) 0.5 MG tablet Take 1/2 to 1 tablet by mouth two times daily 10/28/17  Yes Michele Mcalpine, MD  diclofenac sodium (VOLTAREN) 1 % GEL Apply 2 g topically 4 (four) times daily. 11/25/16  Yes Kathryne Hitch, MD  diltiazem (CARDIZEM CD) 120 MG 24 hr capsule Take 120mg  by mouth daily 03/13/14  Yes Allred, Fayrene Fearing, MD  dronedarone (MULTAQ) 400 MG tablet Take 400 mg by mouth 2 (two) times daily with a meal.   Yes [provider]  ELIQUIS 5 MG TABS tablet TAKE 1 TABLET BY MOUTH TWICE A DAY....NEED OFFICE VISIT BEFORE ANY MORE REFILLS 11/29/14  Yes Allred, Fayrene Fearing, MD  furosemide (LASIX) 40 MG tablet TAKE 2 TABLETS BY MOUTH EVERY MORNING 11/24/17  Yes Michele Mcalpine, MD  guaiFENesin (MUCINEX) 600 MG 12 hr tablet Take 600 mg by mouth 2 (two) times daily as needed. For allergies.   Yes [provider]  HYDROcodone-homatropine (HYCODAN) 5-1.5 MG/5ML syrup Take 5 mLs by mouth every 6 (six) hours as needed for cough. 02/08/18  Yes Michele Mcalpine, MD  levothyroxine (SYNTHROID, LEVOTHROID) 75 MCG tablet Take 1 tablet (75 mcg total) by mouth daily before breakfast. 05/28/17  Yes Michele Mcalpine, MD  losartan (COZAAR) 100 MG tablet TAKE 1 TABLET BY MOUTH  DAILY 11/20/16  Yes Bynum, Velna Hatchet, MD  Magnesium 250 MG TABS Take 250 mg by mouth 2 (two) times daily.   Yes [provider]  metoprolol tartrate (LOPRESSOR) 25 MG tablet Take 25 mg by mouth 2 (two) times daily.  08/06/14  Yes  [provider]  predniSONE (DELTASONE) 5 MG tablet 5mg  every AM 01/04/18  Yes Michele Mcalpine, MD  traMADol (ULTRAM) 50 MG tablet Take 1 tablet (50 mg total) by mouth 3 (three) times daily. 03/05/18  Yes Michele Mcalpine, MD     Allergies  Allergen Reactions  . Cephalexin Hives and Other (See Comments)    "throat started closing up"  . Rocephin [Ceftriaxone Sodium In Dextrose] Anaphylaxis  . Penicillins Rash    Has patient had a PCN reaction causing immediate rash, facial/tongue/throat swelling, SOB or lightheadedness with hypotension: no Has patient had a PCN reaction causing severe rash involving mucus membranes or skin necrosis: no Has patient had a PCN reaction that required hospitalization no Has patient had a PCN reaction occurring within the last 10 years: no If all of the above answers are "NO", then may proceed with Cephalosporin use.  . Flecainide Acetate Other (See Comments)    "couldn't take it"  . Moxifloxacin     Does not remember the reaction  .  Sulfonamide Derivatives     Does not remember reaction ; "was so young when I had reaction to it"     Family History  Problem Relation Age of Onset  . Hypertension Father   . Diabetes Father   . Dementia Father   . Atrial fibrillation Mother   . Diabetes Sister   . Diabetes Brother   . Heart disease Maternal Grandmother   . Heart attack Unknown        granfather     Social History   Socioeconomic History  . Marital status: Married    Spouse name: Not on file  . Number of children: Not on file  . Years of education: Not on file  . Highest education level: Not on file  Occupational History  . Not on file  Social Needs  . Financial resource strain: Not on file  . Food insecurity:    Worry: Never true    Inability: Never true  . Transportation needs:    Medical: Not on file    Non-medical: Not on file  Tobacco Use  . Smoking status: Never Smoker  . Smokeless tobacco: Never Used  Substance and Sexual  Activity  . Alcohol use: No  . Drug use: No  . Sexual activity: Yes    Comment: LAVH  Lifestyle  . Physical activity:    Days per week: Not on file    Minutes per session: Not on file  . Stress: Not on file  Relationships  . Social connections:    Talks on phone: Not on file    Gets together: Not on file    Attends religious service: Not on file    Active member of club or organization: Not on file    Attends meetings of clubs or organizations: Not on file    Relationship status: Not on file  . Intimate partner violence:    Fear of current or ex partner: Not on file    Emotionally abused: Not on file    Physically abused: Not on file    Forced sexual activity: Not on file  Other Topics Concern  . Not on file  Social History Narrative   Works in data entry in [employer]   Married, lives in [place]   Tobacco Use - No.    Alcohol Use - no     Review of Systems  Constitutional: Positive for chills and fatigue. Negative for activity change, appetite change, diaphoresis, fever and unexpected weight change.  HENT: Positive for congestion, postnasal drip, rhinorrhea and sinus pressure. Negative for sneezing, tinnitus, trouble swallowing and voice change.   Eyes: Negative.   Respiratory: Positive for cough, chest tightness, shortness of breath and wheezing.   Cardiovascular: Negative.  Negative for chest pain, palpitations and leg swelling.  Gastrointestinal: Negative for abdominal distention, abdominal pain, constipation, diarrhea and nausea.  Endocrine: Negative.   Genitourinary: Negative.   Musculoskeletal: Negative.   Skin: Negative.   Allergic/Immunologic: Positive for environmental allergies and immunocompromised state.  Neurological: Negative.  Negative for dizziness, tremors, syncope, weakness, light-headedness and numbness.  Hematological: Negative.        Objective:    Vitals:   03/16/18 1034  BP: 126/78  Pulse: 72  Resp: 18  Temp: 98.4 F (36.9 C)    TempSrc: Oral  SpO2: 90%  Weight: 298 lb (135.2 kg)  Height: 5\' 5"  (1.651 m)      Physical Exam  Constitutional: She is oriented to person, place, and time. She  appears well-developed and well-nourished.  Non-toxic appearance. No distress.  Chronically ill and morbidly obese female, appears stated age, no acute distress, nontoxic-appearing, on oxygen via nasal cannula  HENT:  Head: Normocephalic and atraumatic.  Right Ear: Tympanic membrane, external ear and ear canal normal.  Left Ear: Tympanic membrane, external ear and ear canal normal.  Mouth/Throat: Uvula is midline, oropharynx is clear and moist and mucous membranes are normal. Mucous membranes are not pale, not dry and not cyanotic. No uvula swelling. No oropharyngeal exudate, posterior oropharyngeal edema, posterior oropharyngeal erythema or tonsillar abscesses. No tonsillar exudate.  Moderate nasal mucosal erythema with scant clear discharge Mild sinus tenderness to palpation to bilateral maxillary and frontal sinuses   Eyes: Pupils are equal, round, and reactive to light. Conjunctivae, EOM and lids are normal. Right eye exhibits no discharge. Left eye exhibits no discharge. No scleral icterus.  Neck: Normal range of motion and phonation normal. Neck supple. No JVD present. No tracheal deviation present.  Cardiovascular: Normal rate, regular rhythm, normal heart sounds and normal pulses. Frequent extrasystoles are present. Exam reveals no gallop and no friction rub.  No murmur heard. Pulses:      Radial pulses are 2+ on the right side, and 2+ on the left side.  Bilateral lower extremities very edematous, nonpitting bilaterally, warm to the touch, unable to palpate posterior tibialis or dorsal pedis pulses through body habitus   Pulmonary/Chest: Effort normal. No accessory muscle usage or stridor. No tachypnea. No respiratory distress. She has decreased breath sounds. She has no rhonchi. She has rales in the right middle field and  the right lower field. She exhibits no tenderness.  Severely diminished breath sounds with coarse Rales to right mid to lower lung fields Diminished breath sounds at the left lower lung field without rales, rhonchi or wheeze Patient on 2 L via nasal cannula, no accessory muscle use, no retractions, no tachypnea  Abdominal: Soft. Normal appearance and bowel sounds are normal. She exhibits no distension. There is no tenderness. There is no rebound and no guarding.  Musculoskeletal: Normal range of motion. She exhibits no edema or deformity.  Lymphadenopathy:    She has no cervical adenopathy.  Neurological: She is alert and oriented to person, place, and time. She exhibits normal muscle tone. Gait normal.  Skin: Skin is warm, dry and intact. Capillary refill takes 2 to 3 seconds. No rash noted. She is not diaphoretic. No cyanosis. No pallor. Nails show no clubbing.  Psychiatric: She has a normal mood and affect. Her speech is normal and behavior is normal.  Nursing note and vitals reviewed.         Assessment & Plan:      ICD-10-CM   1. Shortness of breath R06.02 DG Chest 2 View  2. Acute pansinusitis, recurrence not specified J01.40     Complicated patient with multiple pulmonary diseases, does not appear to have been seen by our office in the past couple years but is managed by pulmonology.  Initially on exam she had very diminished breath sounds bilaterally at the bases but significantly decreased in the right mid to lower lung fields with coarse crackles.  She endorses shortness of breath and productive cough x6 days secondary to URI with exertional shortness of breath and chills, much worse from her baseline.  She is given a breathing treatment in clinic and then reevaluated with improved aeration to the right mid lung field and persistent coarse crackles to right mid and lower lung fields, with new  inspiratory and expiratory wheeze on the right.  Suspect pneumonia to right mid-lower  lung fields, will start treat with doxycycline, prefer this to Levaquin and azithromycin because of QTC prolongation and medication reactions.  Additionally would also cover any sinusitis  Given degree of patient's lung disease and pulmonary hypertension with A. fib diastolic heart failure and immunocompromise state, request patient get a chest x-ray to evaluate further.  Would like to consult with her pulmonologist before changing oral steroid dose.  Will prescribe Singulair to cover worsening allergies during spring and summer months. Patient's breathing acutely worsens when she encounters warm and humid air when going outside.  Feel we can do a trial of albuterol prior to going outside and see if it decreases her shortness of breath and bronchospasms.  If it is effective hopefully she can avoid frequent increases of her prednisone doses.   Discussed plan with the patient, and she did prefer to recheck her pulmonologist and try to obtain a chest x-ray in his office.  She will advise if she does see him for an actual complete visit and if he will take over management of this or not.  Currently plan is to add Singulair, cough syrup as needed, start antibiotics, chest x-ray, inhaler every 4 hours and also prior to encountering hot or humid weather.  Patient will likely follow-up with pulmonology  Danelle Berry, PA-C 03/16/18 11:03 AM

## 2018-03-16 NOTE — Patient Instructions (Addendum)
Will start montelukast coverage for possible increase in allergies or reactivity during spring and summer seasons  I would use albuterol inhaler prior to going out to warmer humid weather to see if this decreases your shortness of breath or bronchospasms  [facility], we will start doxycycline for coverage of suspected pneumonia.  I have prescribed Tussionex to help with your cough  I would follow-up with your PCP or your pulmonologist in 5 to 7 days for recheck

## 2018-03-17 ENCOUNTER — Telehealth: Payer: Self-pay | Admitting: Pulmonary Disease

## 2018-03-17 NOTE — Progress Notes (Signed)
Xray did show pneumonia to the right middle lobe.  Take the antibiotic as prescribed.  Please see if your pulmonologist would like to see you for follow up, or if he would like us to see you for follow up.  I have ordered, and you would need a repeat CXR in 4-6 weeks to check for resolution.   If your shortness of breath becomes worse than baseline, or if you become distressed with CP, increased work of breathing, excessive fatigue, near syncope, please call 911 or go to the ER to be evaluated, or with less severe sx, please come immediately to be rechecked

## 2018-03-17 NOTE — Telephone Encounter (Signed)
Spoke with pt. She is was diagnosed with PNA by her PCP. Pt was scheduled to follow up with SN on 03/29/18. I offered to move the pt's appointment up to 03/25/18. She wanted to know if it would be okay to wait until the 17th. Advised her if that was her preference that was fine. Nothing further was needed at this time.

## 2018-03-29 ENCOUNTER — Ambulatory Visit (INDEPENDENT_AMBULATORY_CARE_PROVIDER_SITE_OTHER)
Admission: RE | Admit: 2018-03-29 | Discharge: 2018-03-29 | Disposition: A | Discharge status: Home / Self Care | Payer: Medicare Other | Source: Ambulatory Visit | Attending: Pulmonary Disease | Admitting: Pulmonary Disease

## 2018-03-29 ENCOUNTER — Ambulatory Visit (INDEPENDENT_AMBULATORY_CARE_PROVIDER_SITE_OTHER): Payer: Medicare Other | Admitting: Pulmonary Disease

## 2018-03-29 ENCOUNTER — Encounter: Payer: Self-pay | Admitting: Pulmonary Disease

## 2018-03-29 ENCOUNTER — Other Ambulatory Visit (INDEPENDENT_AMBULATORY_CARE_PROVIDER_SITE_OTHER): Payer: Medicare Other

## 2018-03-29 VITALS — BP 126/70 | HR 65 | Temp 97.5°F | Ht 65.0 in | Wt 310.0 lb

## 2018-03-29 DIAGNOSIS — I872 Venous insufficiency (chronic) (peripheral): Secondary | ICD-10-CM

## 2018-03-29 DIAGNOSIS — Z8709 Personal history of other diseases of the respiratory system: Secondary | ICD-10-CM

## 2018-03-29 DIAGNOSIS — J849 Interstitial pulmonary disease, unspecified: Secondary | ICD-10-CM

## 2018-03-29 DIAGNOSIS — I5032 Chronic diastolic (congestive) heart failure: Secondary | ICD-10-CM | POA: Diagnosis not present

## 2018-03-29 DIAGNOSIS — I480 Paroxysmal atrial fibrillation: Secondary | ICD-10-CM

## 2018-03-29 DIAGNOSIS — I890 Lymphedema, not elsewhere classified: Secondary | ICD-10-CM

## 2018-03-29 DIAGNOSIS — G4733 Obstructive sleep apnea (adult) (pediatric): Secondary | ICD-10-CM

## 2018-03-29 DIAGNOSIS — Z7901 Long term (current) use of anticoagulants: Secondary | ICD-10-CM

## 2018-03-29 DIAGNOSIS — I10 Essential (primary) hypertension: Secondary | ICD-10-CM | POA: Diagnosis not present

## 2018-03-29 DIAGNOSIS — J181 Lobar pneumonia, unspecified organism: Principal | ICD-10-CM

## 2018-03-29 DIAGNOSIS — I272 Pulmonary hypertension, unspecified: Secondary | ICD-10-CM | POA: Diagnosis not present

## 2018-03-29 DIAGNOSIS — J189 Pneumonia, unspecified organism: Secondary | ICD-10-CM

## 2018-03-29 LAB — CBC WITH DIFFERENTIAL/PLATELET
Basophils Absolute: 0.1 10*3/uL (ref 0.0–0.1)
Basophils Relative: 0.7 % (ref 0.0–3.0)
EOS PCT: 1 % (ref 0.0–5.0)
Eosinophils Absolute: 0.1 10*3/uL (ref 0.0–0.7)
HEMATOCRIT: 39 % (ref 36.0–46.0)
Hemoglobin: 12.9 g/dL (ref 12.0–15.0)
LYMPHS ABS: 1.1 10*3/uL (ref 0.7–4.0)
LYMPHS PCT: 11.5 % — AB (ref 12.0–46.0)
MCHC: 33.2 g/dL (ref 30.0–36.0)
MCV: 89.2 fl (ref 78.0–100.0)
MONOS PCT: 4.3 % (ref 3.0–12.0)
Monocytes Absolute: 0.4 10*3/uL (ref 0.1–1.0)
NEUTROS ABS: 7.6 10*3/uL (ref 1.4–7.7)
NEUTROS PCT: 82.5 % — AB (ref 43.0–77.0)
PLATELETS: 276 10*3/uL (ref 150.0–400.0)
RBC: 4.38 Mil/uL (ref 3.87–5.11)
RDW: 14.5 % (ref 11.5–15.5)
WBC: 9.2 10*3/uL (ref 4.0–10.5)

## 2018-03-29 LAB — COMPREHENSIVE METABOLIC PANEL
ALBUMIN: 4.4 g/dL (ref 3.5–5.2)
ALT: 15 U/L (ref 0–35)
AST: 17 U/L (ref 0–37)
Alkaline Phosphatase: 134 U/L — ABNORMAL HIGH (ref 39–117)
BILIRUBIN TOTAL: 0.7 mg/dL (ref 0.2–1.2)
BUN: 24 mg/dL — ABNORMAL HIGH (ref 6–23)
CALCIUM: 9.8 mg/dL (ref 8.4–10.5)
CO2: 31 meq/L (ref 19–32)
CREATININE: 1.11 mg/dL (ref 0.40–1.20)
Chloride: 100 mEq/L (ref 96–112)
GFR: 52.31 mL/min — AB (ref >60.00)
Glucose, Bld: 110 mg/dL — ABNORMAL HIGH (ref 70–99)
Potassium: 3.7 mEq/L (ref 3.5–5.1)
Sodium: 143 mEq/L (ref 135–145)
Total Protein: 7.6 g/dL (ref 6.0–8.3)

## 2018-03-29 LAB — TSH: TSH: 2.64 u[IU]/mL (ref 0.35–4.50)

## 2018-03-29 LAB — HEMOGLOBIN A1C: Hgb A1c MFr Bld: 6 % (ref 4.6–6.5)

## 2018-03-29 NOTE — Patient Instructions (Signed)
Today we updated your med list in our EPIC system...    Continue your current medications the same...  Today we did a follow up CXR and some blood work...    We will contact you w/ the results when available...     And [facility] & send copies to him as well...  Call for any questions...  Let's plan a follow up visit after your Bariatric surg (sooner if needed for any problems).Marland Kitchen..Marland Kitchen

## 2018-03-29 NOTE — Progress Notes (Signed)
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 counseling before her insurance will approve the Bariatric surg... We reviewed the following medical problems during today's office visit >>     Severe DYSPNEA>  She reports sl improved overall on Pred taper & home oxygen therapy...    Hx cryptogenic organizing pneumonia>  She presented w/ incr dyspnea 4/18 & had some interstitial changes on CXR; Hi-res CT Chest favored COP & we started Pred w/ nice response both symptomatically & by XRayOdette Hornsdecr Sed rate to normal & resolution of the interstial changes;  She is now on Pred 10-5 Qod & Lasix at 80mg38m.. She denies cough, sputum, or change in her DOE    Nocturnal & exercise hypoxemia>  She desats to 86% on RA w/ min walking & signif O2 desats Qhs despite CPAP=> O2 incr to 3L/min bled into machine & f/u ONO confirms good response w/ resolution of nocturnal hypoxemia.    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    Morbid Obesity> Wt=356 lbs in Jun2016 &  she is around 320 lbs today; DrNewmFranki Monteanning to convert her Lap band to Sleeve gastrectomy soon & we hope that substantial wt loss & improvement in OSA will lead to further improvement in her PA pressures...    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    HBP>  On Metop25Bid, CardizenCD120, Losar100, Lasix80 & BP=132/78... She denies CP, palpit, dizzy, syncope, etc; she has VI, marked LE edema despite the Lasix & we will petition for a compression device...    PAF>  On Eliquis5Bid & Multaq400Bid; she has had numerous ablation procedures; now holding NSR so far...    Ven Insuffic/ Edema (lymphedema)>  On Lasix80/d for the last month & the edema is sl diminished;  She knows to follow a strict low salt diet, support hose, elevation; we are trying to get her a pneumatic compression device...Marland KitchenMarland Kitchen  Hypothyroid>  On Synthroid75 now w/ prev TSH 01/2017 = 5.37 on Synthroid50 at the time (therefore dose increased slightly)...    Medical issues> PCP- Heather [name]; HBP, hypothyroid, GERD, IC, s/p bilat THR, DJD- knees, etc...  EXAM shows pear-shaped body habitus, Afeb, VSS, O2sat=97% on O2 at 2L/min pulse; Wt=326#, 5'5"Tall, BMI=54;  HEENT- neg, mallampati2;  Chest- decr BS at bases, w/o w/r/r;  Heart- RR gr1/6 SEM w/o r/g;  Abd- obese, neg;  Ext- VI w/ +edema... IMP/PLAN>>  It seems there is no immed rush to decr her Pred to the '5mg'$ /d level required for surg since she is required to participate in a 58moprogram of supervised wt loss w/ nutritional counseling etc via [facility];  Therefore we will continue the '10mg'$  alt w/ '5mg'$  QOD dose for now;  We wrote for TRAMADOL50 + tylenol for her arthritis pain;  She will continue w/ her O2, continue her meds, and work on weight reduction... For her venous insuffic/ lymphedema in LEs- she has been trying no salt, elevation, support hose, & the Lasix, we will try to get her a pneumatic compression device...  ~  September 30, 2017:   270moOV & CyLaurelleturns for a 13m17moV on Pred 10-5 qod and states she hasn't required any adjustment in dose over the interval; She remains on CPAP Qhs w/ O2 at 3L/min bleed-in and using 2L/min days; all her home pulse-ox checks are in the 90s & heart reate in the 60s; she denies cough/ sput/ hemoptysis/ CP/ etc; her DOE is the same (OK w/ ADLs and getting about, still not able to exercise significantly- using Tramadol+Tylenol vs Tylenol#3 for arthritis pain), and her edema is without change- on low sodium, taking Lasix80, and still awaiting the pneumatic compression device...    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    Nocturnal & exercise hypoxemia> stable on the 3L/min Qhs and 2L/min daytime use...    OSA> [name] & CPAP download over the last 30d shows excellent compliance and efficacy (see below face to face note)    Obesity> weight today is 321# (down 5#) and we are awaiting bariatric surg...    VI/ lymphedema> on low sodium & Lasix80, we are awaiting approval for pneumatic compression device... EXAM shows pear-shaped body habitus, Afeb, VSS, O2sat=92% on O2 at 2L/min pulse; Wt=321#, 5'5"Tall, BMI=53;  HEENT- neg, mallampati2;  Chest- decr BS at bases, w/o w/r/r;  Heart- RR gr1/6 SEM w/o r/g;  Abd- obese, neg;  Ext- VI w/ +edema...  LABS 09/30/17>  BMet- stable w/ BS=106, BUN=35, Cr=1.22;  ACE=46 (9-67);  Sed=18 (0-30) IMP/PLAN>>  OK to wean Pred to '5mg'$  daily as we discussed;  Continue same CPAP & face-to-face note below;  Continue diet, nutritional counseling from [facility]- moving toward bariatric surg date in 2019;  Still awaiting pneumatic compression device to aide in edema management so we can cut down on her Lasix dose later...  09/30/17 FACE-to-FACE note for CPAP>> Pt [facility] the last 13mo49moAP download from 10/30 - 09/09/17 shows excellent  compliance (using it 30/30 days for ave 8 hrs per night), and excellent efficacy (on CPAP 11 w/ AHI<1 and no signif leaks)...  ~  December 29, 2017:  3 month ROV & pulmonary/ medical follow up visit>  CyntCypresstinues to maneuver thru  the [facility]-Bariatric program requirements for surg, getting monthly nutrition visits that continue thru ~June then surg can be scheduled;  She finally got the lymphedema leg wrap pneumatic compression device & has been pumping for 1H three times daily- she feels that it is already helping & she is improved... We reviewed these interval Epic notes>    She saw SURG- DrNewman 11/06/17>  She has lost ~50# from her peak wt of 365# in 2016; on Pred 10-5Qod for her lung condition, she has a lap-band (placed 2009) & they are planning to convert to a sleeve gastrectomy...     She had f/u w/ CARDS- NP for Heather [name] in [place]>  HBP, AFib, OSA, PulmHTN; s/p mult ablation procedures, on Eliquis5Bid, Multaq400Bid, CardizemCD120, Metop25Bid, Losar100, Lasix40-2Qam; no change in meds...     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    Hx cryptogenic organizing pneumonia>  She presented w/ incr dyspnea 4/18 & had some interstitial changes on CXR; Hi-res CT Chest favored COP (but ACE was 76- r/o Sarcoid) & we started Pred w/ nice response both symptomatically & by Odette Horns w/ decr Sed rate to normal & resolution of the interstial changes;  She is now on Pred 10-5 Qod & Lasix at 42mQam... She denies cough, sputum, or change in her DOE    Nocturnal & exercise hypoxemia>  She desats to 86% on RA w/ min  walking & signif O2 desats Qhs despite CPAP=> O2 incr to 3L/min bled into machine & f/u ONO confirms good response w/ resolution of nocturnal hypoxemia.    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    Morbid Obesity> Wt=365 lbs in Jun2016 & she is around 320 lbs today; DrFranki Montes planning to convert her Lap band to Sleeve gastrectomy soon & we hope that substantial wt loss & improvement in OSA will lead to further improvement in her PA pressures...    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    HBP>  On Metop25Bid, CardizenCD120, Losar100, Lasix80 & BP=134/72... She denies CP, palpit, dizzy, syncope, etc; she has VI, marked LE edema despite the Lasix & we will petition for a compression device=> finally approved & she is using the device for 1H Tid & feels that it is helping...     PAF>  On Eliquis5Bid & Multaq400Bid; she has had numerous ablation procedures; now holding NSR so far...    Ven Insuffic/ Edema (lymphedema)>  On Lasix80/d for the last month & the edema is sl diminished;  She knows to follow a strict low salt diet, support hose, elevation; we are trying to get her a pneumatic compression  device...    Hypothyroid>  On Synthroid75 now w/ prev TSH 01/2017 = 5.37 on Synthroid50 at the time (therefore dose increased slightly)...    Medical issues> PCP- Heather [name]; HBP, hypothyroid, GERD, IC, s/p bilat THR, DJD- knees, etc...  EXAM shows pear-shaped body habitus, Afeb, VSS, O2sat=92% on O2 at 2L/min pulse; Wt=327#,  5'5"Tall, BMI=53;  HEENT- neg, mallampati2;  Chest- decr BS at bases, w/o w/r/r;  Heart- RR gr1/6 SEM w/o r/g;  Abd- obese, neg;  Ext- VI w/ +edema...  CXR 12/29/17>  Cardiomeg & tortuous Ao, lungs are felt to be clear w/o focal abn, lap band seen in [facility]...  CPAP download 2/17-3/18/19>  Excellent compliance (used 30/30 days, 8-9H per night), CPAP 11 cmH2O, AHI=<1, min leak...  2DEcho done 01/06/18>  Norm LVF w/ EF=60-65%, no regional wall motion abn, could not eval LV diastolic function, AoV sclerosis & triv regurg, AscAo is 7m, MV mildly thickened w/ mild regurg, mild RV dil, RA is severely dil, mod TR, elev PAsys=760mg (which is sl worse than 4/18 value of 6547m)... IMP/PLAN>>  Unfortunately the est PA pressure is increased over the last yr- she is using the CPAP regularly, using her oxygen regularly, taking her meds faithfully, etc; hopefully the sleeve gastrectomy planned for ?June will result in the needed wt reduction w/ subseq improvement in her PulmHTN;  We decided to wean the Pred down to '5mg'$  Qam, anxiously awaiting the bariatric surg planned for June...    ~  March 29, 2018:  64mo20mo & CyntAsaiah seen by her PCP recently c/o URI w/ cough, chest congestion, & incr SOB;  CXR showed a new RML opacity c/w pneumonia & she was treated w/ Doxy Bid;  She reports improved & approaching baseline- breathing better, DOE improved, no more f/c/s;  We discussed rechecking f/u CXR today...  CyntEmpressannoyed at the slow pace of the [facility]- Bariatric program & currently awaiting final requirements to sched the surg w/ DrNewman=> they indicated that she would need labs including TSH & A1C and we will draw these today to expedite,,, She remains stable on her CPAP; she has continued to use her pneumatic leg pump Tid for 1hr treatments; she has lost another 17# down to 310# today... 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    Hx cryptogenic organizing pneumonia>  She presented w/ incr dyspnea 4/18 & had some interstitial changes on CXR; Hi-res CT Chest favored COP (but ACE was 76- r/o Sarcoid, we decided on no Bx) & we started Pred w/ nice response both symptomatically & by XRay w/ decr sed rate to normal & resolution of the interstial changes;  She is now on Pred5Qd & Lasix '80mg'$ Qam... She denies cough, sputum, or change in her DOE    Nocturnal & exercise hypoxemia>  She desats to 86% on RA w/ min walking & signif O2 desats Qhs despite CPAP=> O2 incr to 3L/min bled into machine & f/u ONO confirms good response w/ resolution of nocturnal hypoxemia;  Awaiting Bariatric surg (Sleeve gastrectomy) and we will retest pt in the post-op period....    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    OSA>  Initial sleep study by Heather [name] & CPAP prescribed & followed by him; CPAP download from 2016 showed AHI=1.5 on CPAP 11;  Similar results on CPAP download 4/18 & subseq...    Morbid Obesity> Wt=365 lbs in Jun2016 & she is around 310 lbs today; DrFranki Montes planning to convert her Lap band to Sleeve gastrectomy soon & we hope that substantial wt loss & improvement in OSA will lead to further improvement in her PA pressures...    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    HBP>  On Metop25Bid, CardizemCD120,  Losar100, Lasix80 & BP=126/70... She denies CP, palpit, dizzy, syncope, etc; she has VI, marked LE edema despite the Lasix & we will petition for a compression device=> finally approved & she is using the device for 1H Tid & feels that it is helping...     PAF>  On Eliquis5Bid & Multaq400Bid; she has had numerous ablation procedures; now holding NSR so far...    Ven Insuffic/ Edema (lymphedema)>  On Lasix80/d for the last month & the edema is sl diminished;  She knows to follow a strict low salt diet, support hose, elevation; we obtained a pneumatic compression device for her use & she's been diligent about doing it 3x/d for 1H sessions and she reports that it is really helping decr the edema...    Hypothyroid>  On Synthroid75 now w/ TSH= 2.64 today;  prev TSH 01/2017 = 5.37 on Synthroid50 at that time and dose incr to 7581md...    Medical issues> PCP- Heather [name]; HBP, hypothyroid, GERD, IC, s/p bilat THR, DJD- knees, etc...  EXAM shows pear-shaped body habitus, Afeb, VSS, O2sat=97% on O2 at 2L/min pulse; Wt=310#, 5'5"Tall, BMI=51;  HEENT- neg, mallampati2;  Chest- decr BS at bases, w/o w/r/r;  Heart- RR gr1/6 SEM w/o r/g;  Abd- obese, neg;  Ext- VI w/ +edema...  CXR 03/29/18>  (independently reviewed by me in the PACS system) shows heart size stable, lungs well aerated & clear- no focal opacity or effusion, prev RML infiltrate has resolved, NAD...   LABS 03/29/18>  Chems- ok w/ BS=110, Cr=1.11, A1c=6.0;  CBC- ok w/ Hg=12.9, WBC=9.2;  TSH=2.64 IMP/PLAN>>  CynTachina ready for her Sleeve gastrectomy & we will refer chart to DrNewman's attn- btw her TSH=2.64 and her A1c=6.0... Marland KitchenMarland Kitchene is excited to get this done & we will check her after a few wks post op...    Past Medical History:  Diagnosis Date  . Anemia    "onc;e; had to take iron for awhile"  . Atrial fibrillation ([place]  Began in 2004.  [facility].  She is off coumadin  now with no noted recurrent atrial  fibrillation since 2007. til 04/01/12; s/p initiation of Rhythmol with DCCV June 2013  . Carpal tunnel syndrome   . Degenerative disk disease   . GERD (gastroesophageal reflux disease)    resolved after lap band  . History of blood transfusion    "w/both hip replacements"  . HTN (hypertension)   . Hypothyroidism   . IBS (irritable bowel syndrome)   . Interstitial cystitis   .  Morton's neuroma    right foot  . Obesity   . On home oxygen therapy   . Pulmonary hypertension ([place])   . Shortness of breath    only without oxygen  . Sleep apnea 04/01/12   "dx'd just last week"  . Sleep apnea    uses CPAP, 11 CWP with residual AHI 6.3    Past Surgical History:  Procedure Laterality Date  . ANTERIOR AND POSTERIOR REPAIR  10/20/2012   Procedure: ANTERIOR (CYSTOCELE) AND POSTERIOR REPAIR (RECTOCELE);  Surgeon: Alwyn Pea, MD;  Location: [facility];  Service: Gynecology;  Laterality: N/A;  2 hours  . ATRIAL FIBRILLATION ABLATION N/A 12/30/2012   Procedure: ATRIAL FIBRILLATION ABLATION;  Surgeon: Thompson Grayer, MD;  Location[name];  Service: Cardiovascular;  Laterality: N/A;  . BACK SURGERY    . CARDIAC CATHETERIZATION    . CARDIAC ELECTROPHYSIOLOGY MAPPING AND ABLATION  ~ 2005 and 2007   "did more 2nd time; both at [name]"  . CARDIOVERSION  04/02/2012   Procedure: CARDIOVERSION;  Surgeon: Larey Dresser, MD;  Location: [place];  Service: Cardiovascular;  Laterality: N/A;  . CARDIOVERSION  06/16/2012   Procedure: CARDIOVERSION;  Surgeon: Thayer Headings, MD;  Location: [place];  Service: Cardiovascular;  Laterality: N/A;  amanda/ebp/Beverly( or scheduling)  . CARDIOVERSION  10/29/2012   Procedure: CARDIOVERSION;  Surgeon: Larey Dresser, MD;  Location[name];  Service: Cardiovascular;  Laterality: N/A;  . CARDIOVERSION N/A 03/30/2013   Procedure: CARDIOVERSION;  Surgeon: Larey Dresser, MD;  Location: [place];  Service: Cardiovascular;  Laterality: N/A;  .  CARDIOVERSION N/A 12/23/2013   Procedure: CARDIOVERSION;  Surgeon: Dorothy Spark, MD;  Location: [place];  Service: Cardiovascular;  Laterality: N/A;  9:27 Propofol '70mg'$ , IV   150 joules synched shock by Dr. Meda Coffee @ 150 joules...SR   post 12 lead ordered.   . CARPAL TUNNEL RELEASE  1990's   bilaterally  . DILATION AND CURETTAGE OF UTERUS  2000  . JOINT REPLACEMENT     bilateral hips  . KNEE ARTHROSCOPY Right 11/18/2016   Procedure: RIGHT KNEE ARTHROSCOPY WITH DEBRIDEMENT;  Surgeon: Mcarthur Rossetti, MD;  Location: [place];  Service: Orthopedics;  Laterality: Right;  . LAPAROSCOPIC GASTRIC BANDING  2009  . LASIK    . POSTERIOR FUSION LUMBAR SPINE  2000's   "nerve problems"  . POSTERIOR FUSION LUMBAR SPINE  2000's  . TEE WITHOUT CARDIOVERSION N/A 12/29/2012   Procedure: TRANSESOPHAGEAL ECHOCARDIOGRAM (TEE);  Surgeon: Peter M Martinique, MD;  Location: [place];  Service: Cardiovascular;  Laterality: N/A;  . [place]  . TOTAL HIP ARTHROPLASTY  ~2009; 2010   right; left  . TUBAL LIGATION  1980  . VAGINAL HYSTERECTOMY  2000    Outpatient Encounter Medications as of 03/29/2018  Medication Sig  . albuterol (PROVENTIL HFA;VENTOLIN HFA) 108 (90 Base) MCG/ACT inhaler Inhale 2 puffs into the lungs every 4 (four) hours as needed for wheezing or shortness of breath (use prior to going out in hot/humid weather).  Marland Kitchen amitriptyline (ELAVIL) 10 MG tablet TAKE 1 TABLET (10 MG TOTAL) BY MOUTH AT BEDTIME.  . cetirizine (ZYRTEC) 10 MG tablet Take 10 mg by mouth daily.  . chlorpheniramine-HYDROcodone (TUSSIONEX PENNKINETIC ER) 10-8 MG/5ML SUER Take 5 mLs by mouth every 12 (twelve) hours as needed for cough.  . Cholecalciferol (VITAMIN D) 2000 UNITS tablet Take 2,000 Units by mouth daily.  . clonazePAM (KLONOPIN) 0.5 MG tablet Take 1/2 to  1 tablet by mouth two times daily  . diclofenac sodium (VOLTAREN) 1 % GEL Apply 2 g topically 4 (four) times daily.  Marland Kitchen diltiazem  (CARDIZEM CD) 120 MG 24 hr capsule Take '120mg'$  by mouth daily  . dronedarone (MULTAQ) 400 MG tablet Take 400 mg by mouth 2 (two) times daily with a meal.  . ELIQUIS 5 MG TABS tablet TAKE 1 TABLET BY MOUTH TWICE A DAY....NEED OFFICE VISIT BEFORE ANY MORE REFILLS  . furosemide (LASIX) 40 MG tablet TAKE 2 TABLETS BY MOUTH EVERY MORNING (Patient taking differently: TAKE 2 TABLETS BY MOUTH EVERY MORNING for '80mg'$  total)  . guaiFENesin (MUCINEX) 600 MG 12 hr tablet Take 600 mg by mouth 2 (two) times daily as needed. For allergies.  Marland Kitchen HYDROcodone-homatropine (HYCODAN) 5-1.5 MG/5ML syrup Take 5 mLs by mouth every 6 (six) hours as needed for cough.  . levothyroxine (SYNTHROID, LEVOTHROID) 75 MCG tablet Take 1 tablet (75 mcg total) by mouth daily before breakfast.  . losartan (COZAAR) 100 MG tablet TAKE 1 TABLET BY MOUTH  DAILY  . Magnesium 250 MG TABS Take 250 mg by mouth 2 (two) times daily.  . metoprolol tartrate (LOPRESSOR) 25 MG tablet Take 25 mg by mouth 2 (two) times daily.   . montelukast (SINGULAIR) 10 MG tablet Take 1 tablet (10 mg total) by mouth at bedtime.  . predniSONE (DELTASONE) 5 MG tablet '5mg'$  every AM  . traMADol (ULTRAM) 50 MG tablet Take 1 tablet (50 mg total) by mouth 3 (three) times daily.  . [DISCONTINUED] doxycycline (VIBRA-TABS) 100 MG tablet Take 1 tablet (100 mg total) by mouth 2 (two) times daily.   No facility-administered encounter medications on file as of 03/29/2018.     Allergies  Allergen Reactions  . Cephalexin Hives and Other (See Comments)    "throat started closing up"  . Rocephin [Ceftriaxone Sodium In Dextrose] Anaphylaxis  . Penicillins Rash    Has patient had a PCN reaction causing immediate rash, facial/tongue/throat swelling, SOB or lightheadedness with hypotension: no Has patient had a PCN reaction causing severe rash involving mucus membranes or skin necrosis: no Has patient had a PCN reaction that required hospitalization no Has patient had a PCN reaction  occurring within the last 10 years: no If all of the above answers are "NO", then may proceed with Cephalosporin use.  . Flecainide Acetate Other (See Comments)    "couldn't take it"  . Moxifloxacin     Does not remember the reaction  . Sulfonamide Derivatives     Does not remember reaction ; "was so young when I had reaction to it"    Immunization History  Administered Date(s) Administered  . Influenza Split 07/13/2016  . Influenza,inj,Quad PF,6+ Mos 06/30/2017  . Influenza-Unspecified 11/04/2012, 08/24/2014  . Zoster 03/24/2013    Current Medications, Allergies, Past Medical History, Past Surgical History, Family History, and Social History were reviewed in [employer] record.   Review of Systems            All symptoms NEG except where BOLDED >>  Constitutional:  F/C/S, fatigue, anorexia, unexpected weight change. HEENT:  HA, visual changes, hearing loss, earache, nasal symptoms, sore throat, mouth sores, hoarseness. Resp:  cough, sputum, hemoptysis; SOB, tightness, wheezing. Cardio:  CP, palpit, DOE, orthopnea, edema. GI:  N/V/D/C, blood in stool; reflux, abd pain, distention, gas. GU:  dysuria, freq, urgency, hematuria, flank pain, voiding difficulty. MS:  joint pain, swelling, tenderness, decr ROM; neck pain, back pain, etc. Neuro:  HA,  tremors, seizures, dizziness, syncope, weakness, numbness, gait abn. Skin:  suspicious lesions or skin rash. Heme:  adenopathy, bruising, bleeding. Psyche:  confusion, agitation, sleep disturbance, hallucinations, anxiety, depression suicidal.   Objective:   Physical Exam      Vital Signs:  Reviewed...   General:  WD, morbidly obese, 67 y/o WF in NAD; alert & oriented; pleasant & cooperative... HEENT:  [place]/AT; Conjunctiva- pink, Sclera- nonicteric, EOM-wnl, PERRLA, EACs-clear, TMs-wnl; NOSE-clear; THROAT-clear & wnl.  Neck:  Supple w/ decr ROM; no JVD; normal carotid impulses w/o bruits; no thyromegaly or nodules  palpated; no lymphadenopathy.  Chest:  decr BS bilat at bases, but clear without wheezes, rales, or rhonchi heard. Heart:  Regular Rhythm; gr1/6SEM without rubs or gallops detected. Abdomen:  Obese, soft & nontender- no guarding or rebound; normal bowel sounds; no organomegaly or masses palpated. Ext:  decrROM; +arthritic changes; no varicose veins, +venous insuffic, lymphedema changes;  Pulses intact w/o bruits. Neuro:  No focal neuro deficits, +gait abnormality & balance fair... Derm:  No lesions noted; no rash etc. Lymph:  No cervical, supraclavicular, axillary, or inguinal adenopathy palpated.   Assessment:      IMP >>     Severe DYSPNEA>  She reports sl improved w/ wt loss so far & home oxygen therapy...    Hx cryptogenic organizing pneumonia> presented 4/18 w/ dyspnea & change in CXR=> His-res CT Chest suggesting COP & improved w/ Pred Rx...    Nocturnal & exercise hypoxemia>  She desats to 86% on RA w/ min walking & signif O2 desats Qhs despite CPAP=> O2 incr to 3L/min bled into machine.    Pulmonary HTN>  2DEcho in [place] by DrFitagerald 5/16 indicated PAsys~80 (in 2014 it was ~34)...    OSA>  Initial sleep study by Heather [name] & CPAP prescribed & followed by him; CPAP download from spring2016 showed AHI=1.5 on CPAP 11...    Morbid Obesity> Wt=356 lbs in Jun2016 & she is down to 311 lbs today; Franki Monte is planning to convert her Lap band to Sleeve gastrectomy soon...    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    HBP>  On Metop25Bid, CardizenCD120, Losar100, Lasix80 & BP=128/74...    PAF>  On Eliquis5Bid & Multaq400; she has had numerous ablation procedures; now holding NSR on the amio...    Ven Insuffic/ lymphedema>  On Lasix80 & the low salt diet...    Hypothyroid>  On Synthroid100 w/ TSH followed by Heather [name]...  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       Now she presents w/ a superimposed acute dyspnea w/ CXR abn thought to suggest early CHF & a BNP=250 + 2DEcho w/ good LVF but Gr2DD, placed on Lasix w/ only min improvement;  Note recent Ortho eval w/ very high inflamm markers- treated w/ synvisc;  Further Pulm eval reveals Hi-res CT Chest suggesting COP & collagen vasc screen w/ a few unexplained random marker abn (eg- neg ANA & ANCA but pos RNP,  plus the ACE level is sl elev & inflamm markers remain very high (sed=122);  She was placed on empiric trial of PRED pending some of our studies and she's had a good clinical response w/ improvement in her breathing (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  We will have her ret in 59moto assess response & recheck labs. 03/02/17>   CIsabelais improved- breathing better on the Pred & inflamm markers are much better;  This favors the Dx of COP & we will continue to wean the Pred from '20mg'$ /d now to 20-10 qod for 3wks and then '10mg'$ /d til return in 6wks;  She is admonished to get on a vigorous low carb, low fat diet & get her weight down, again I am in favor of Bariatric surg as a life-saving procedure for her- she still has severe secondary pulmHTN on treatment OSA, morbid obesity, restrictive pulm dis. 04/27/17>   We decided to continue the Pred at 20-10 Qod & incr her Lasix from '40mg'$ /d to '80mg'$  Qam w/ ROV recheck in 1658month8/16/18>   CyShareekandicates to me that she is hoping to get her sleeve gastrectomy surg in Oct/Nov and needs to be on a low dose of Pred ~'5mg'$ /d or less for this to happen;  She is improved  today w/ the Pred 20-10 qod & Lasix80/d => we decided to cut the Pred to '10mg'$ /d and keep the Lasix at 80Qam for now w/ rov 38m48mor recheck leading up to her bariatric surg. 06/30/17>   We decided to cut the Pred from '10mg'$ /d to '10mg'$  alt w/ '5mg'$  QOD;  Continue Lasix '80mg'$  Qam;  She knows to elim all salt/sodium from her diet & we will add pneumatic compression device for her leg edema;  We will continue to check pt monthly prior to her Bariatric surg. 08/11/17>   It seems there is no immed rush to decr her Pred to the '5mg'$ /d level required for surg since she is required to participate in a 58mo63mogram of supervised wt loss w/ nutritional counseling etc via [facility];  Therefore we will continue the '10mg'$  alt w/ '5mg'$  QOD dose for now;  We wrote for TRAMADOL50 + tylenol for her arthritis pain;  She will continue w/ her O2, continue her meds, and work on weight reduction 09/30/17>   OK to wean Pred to '5mg'$  daily as we discussed;  Continue same CPAP & face-to-face note below;  Continue diet, nutritional counseling from [facility]- moving toward bariatric surg date in 2019;  Still awaiting pneumatic compression device to aide in edema management so we can cut down on her Lasix dose later 12/29/17>   Unfortunately the est PA pressure is increased over the last yr- she is using the CPAP regularly, using her oxygen regularly, taking her meds faithfully, etc; hopefully the sleeve gastrectomy planned for ?June will result in the needed wt reduction w/ subseq improvement in her PulmHTN;  We decided to wean the Pred down to '5mg'$  Qam, anxiously awaiting the bariatric surg planned for June...  03/29/18>   IMP/PLAN>>  CyntEulalaready for her Sleeve gastrectomy & we [facility]- btw her TSH=2.64 and her A1c=6.0... SMarland KitchenMarland Kitchen is excited to get this done & we will check her after a few wks post op.   Plan:     Patient's Medications  New Prescriptions   No medications on file  Previous Medications   ALBUTEROL (PROVENTIL  HFA;VENTOLIN HFA) 108 (90 BASE) MCG/ACT INHALER  Inhale 2 puffs into the lungs every 4 (four) hours as needed for wheezing or shortness of breath (use prior to going out in hot/humid weather).   AMITRIPTYLINE (ELAVIL) 10 MG TABLET    TAKE 1 TABLET (10 MG TOTAL) BY MOUTH AT BEDTIME.   CETIRIZINE (ZYRTEC) 10 MG TABLET    Take 10 mg by mouth daily.   CHLORPHENIRAMINE-HYDROCODONE (TUSSIONEX PENNKINETIC ER) 10-8 MG/5ML SUER    Take 5 mLs by mouth every 12 (twelve) hours as needed for cough.   CHOLECALCIFEROL (VITAMIN D) 2000 UNITS TABLET    Take 2,000 Units by mouth daily.   CLONAZEPAM (KLONOPIN) 0.5 MG TABLET    Take 1/2 to 1 tablet by mouth two times daily   DICLOFENAC SODIUM (VOLTAREN) 1 % GEL    Apply 2 g topically 4 (four) times daily.   DILTIAZEM (CARDIZEM CD) 120 MG 24 HR CAPSULE    Take '120mg'$  by mouth daily   DRONEDARONE (MULTAQ) 400 MG TABLET    Take 400 mg by mouth 2 (two) times daily with a meal.   ELIQUIS 5 MG TABS TABLET    TAKE 1 TABLET BY MOUTH TWICE A DAY....NEED OFFICE VISIT BEFORE ANY MORE REFILLS   FUROSEMIDE (LASIX) 40 MG TABLET    TAKE 2 TABLETS BY MOUTH EVERY MORNING   GUAIFENESIN (MUCINEX) 600 MG 12 HR TABLET    Take 600 mg by mouth 2 (two) times daily as needed. For allergies.   HYDROCODONE-HOMATROPINE (HYCODAN) 5-1.5 MG/5ML SYRUP    Take 5 mLs by mouth every 6 (six) hours as needed for cough.   LEVOTHYROXINE (SYNTHROID, LEVOTHROID) 75 MCG TABLET    Take 1 tablet (75 mcg total) by mouth daily before breakfast.   LOSARTAN (COZAAR) 100 MG TABLET    TAKE 1 TABLET BY MOUTH  DAILY   MAGNESIUM 250 MG TABS    Take 250 mg by mouth 2 (two) times daily.   METOPROLOL TARTRATE (LOPRESSOR) 25 MG TABLET    Take 25 mg by mouth 2 (two) times daily.    MONTELUKAST (SINGULAIR) 10 MG TABLET    Take 1 tablet (10 mg total) by mouth at bedtime.   PREDNISONE (DELTASONE) 5 MG TABLET    '5mg'$  every AM   TRAMADOL (ULTRAM) 50 MG TABLET    Take 1 tablet (50 mg total) by mouth 3 (three) times daily.   Modified Medications   No medications on file  Discontinued Medications   DOXYCYCLINE (VIBRA-TABS) 100 MG TABLET    Take 1 tablet (100 mg total) by mouth 2 (two) times daily.

## 2018-03-30 ENCOUNTER — Telehealth: Payer: Self-pay | Admitting: Pulmonary Disease

## 2018-03-30 NOTE — Telephone Encounter (Signed)
This information has been placed on Heather [name]'s desk. She was not at her desk to personally hand to her.

## 2018-03-30 NOTE — Telephone Encounter (Signed)
Jury Duty form hand delivered to SN to sign. Will contact Patient when ready.

## 2018-04-01 ENCOUNTER — Ambulatory Visit (INDEPENDENT_AMBULATORY_CARE_PROVIDER_SITE_OTHER): Payer: Medicare Other | Admitting: Psychiatry

## 2018-04-01 DIAGNOSIS — F509 Eating disorder, unspecified: Secondary | ICD-10-CM

## 2018-04-02 ENCOUNTER — Telehealth: Payer: Self-pay | Admitting: Pulmonary Disease

## 2018-04-02 NOTE — Telephone Encounter (Signed)
Per SN- ok for Tramadol refill, take 1 by mouth, TID, as needed, #90, no refills, called to preferred pharmacy, [facility] [address].  Patient notified.  Nothing further needed.

## 2018-04-06 NOTE — Telephone Encounter (Signed)
Spoke with SN about letters.  Letter should be done at end of the week. Will follow up.

## 2018-04-06 NOTE — Telephone Encounter (Signed)
Please advise if this has been taken care of. Thanks.  

## 2018-04-09 ENCOUNTER — Encounter: Payer: Self-pay | Admitting: Pulmonary Disease

## 2018-04-09 NOTE — Telephone Encounter (Signed)
Patient notified that letter was completed by SN and mailed.  Jury duty form and extra letter placed in envelope to be mailed to Patient per Patient request.  Nothing further at this time.

## 2018-04-12 ENCOUNTER — Other Ambulatory Visit: Payer: Self-pay | Admitting: Family Medicine

## 2018-05-03 ENCOUNTER — Ambulatory Visit: Payer: Self-pay | Admitting: Pulmonary Disease

## 2018-05-09 ENCOUNTER — Other Ambulatory Visit: Payer: Self-pay | Admitting: Family Medicine

## 2018-05-09 ENCOUNTER — Other Ambulatory Visit: Payer: Self-pay | Admitting: Pulmonary Disease

## 2018-05-17 NOTE — Progress Notes (Signed)
LOV/CARDS CLEARANCE DR. FITZGERALD 12-21-17 Epic   LOV PULM DR NADEL 03-29-18 Epic   ECHO 01-06-18 Epic   HGBA1C 03-29-18 Epic   CXR 03-29-18 Epic   EKG 07-30-17 Epic CARE EVERYWHERE

## 2018-05-17 NOTE — Progress Notes (Signed)
NEED ORDERS!; PRE-OP APPT IS 05-18-18. THANKS

## 2018-05-18 ENCOUNTER — Encounter (HOSPITAL_COMMUNITY)
Admission: RE | Admit: 2018-05-18 | Discharge: 2018-05-18 | Disposition: A | Discharge status: Home / Self Care | Payer: Medicare Other | Source: Ambulatory Visit | Attending: Surgery | Admitting: Surgery

## 2018-05-18 ENCOUNTER — Other Ambulatory Visit: Payer: Self-pay

## 2018-05-18 ENCOUNTER — Encounter (HOSPITAL_COMMUNITY): Payer: Self-pay

## 2018-05-18 ENCOUNTER — Other Ambulatory Visit: Payer: Self-pay | Admitting: *Deleted

## 2018-05-18 DIAGNOSIS — Z01812 Encounter for preprocedural laboratory examination: Secondary | ICD-10-CM | POA: Insufficient documentation

## 2018-05-18 HISTORY — DX: Other disorders of lung: J98.4

## 2018-05-18 HISTORY — DX: Pneumonia, unspecified organism: J18.9

## 2018-05-18 LAB — BASIC METABOLIC PANEL
ANION GAP: 12 (ref 5–15)
BUN: 31 mg/dL — ABNORMAL HIGH (ref 8–23)
CO2: 31 mmol/L (ref 22–32)
Calcium: 9.7 mg/dL (ref 8.9–10.3)
Chloride: 101 mmol/L (ref 98–111)
Creatinine, Ser: 1.19 mg/dL — ABNORMAL HIGH (ref 0.44–1.00)
GFR, EST AFRICAN AMERICAN: 54 mL/min — AB (ref >60)
GFR, EST NON AFRICAN AMERICAN: 47 mL/min — AB (ref >60)
Glucose, Bld: 120 mg/dL — ABNORMAL HIGH (ref 70–99)
POTASSIUM: 3.7 mmol/L (ref 3.5–5.1)
Sodium: 144 mmol/L (ref 135–145)

## 2018-05-18 LAB — CBC
HEMATOCRIT: 40.4 % (ref 36.0–46.0)
HEMOGLOBIN: 13 g/dL (ref 12.0–15.0)
MCH: 29.5 pg (ref 26.0–34.0)
MCHC: 32.2 g/dL (ref 30.0–36.0)
MCV: 91.8 fL (ref 78.0–100.0)
Platelets: 258 10*3/uL (ref 150–400)
RBC: 4.4 MIL/uL (ref 3.87–5.11)
RDW: 14.9 % (ref 11.5–15.5)
WBC: 8.6 10*3/uL (ref 4.0–10.5)

## 2018-05-18 MED ORDER — FUROSEMIDE 40 MG PO TABS: 80.0000 mg | ORAL_TABLET | Freq: Every morning | ORAL | 5 refills | Status: DC

## 2018-05-18 NOTE — Progress Notes (Signed)
BMP ROUTED VIA Epic TO DR Ezzard StandingNEWMAN

## 2018-05-18 NOTE — Patient Instructions (Addendum)
Heather [name]  05/18/2018   Your procedure is scheduled on: 05-24-18    [facility]  [facility]    Report to admitting at 5:30AM    Call this number if you have problems the morning of surgery [phone]     Remember: Do not eat food or drink liquids :After Midnight.   PLEASE BRING CPAP MASK AND  TUBING ONLY. DEVICE WILL BE PROVIDED!    Take these medicines the morning of surgery with A SIP OF WATER:  Prednisone,  Diltiazem, Multaq, metoprolol, tramadol if needed, mucinex if needed,                                 You may not have any metal on your body including hair pins and              piercings  Do not wear jewelry, make-up, lotions, powders or perfumes, deodorant             Do not wear nail polish.  Do not shave  48 hours prior to surgery.     Do not bring valuables to the hospital. [facility] IS NOT             RESPONSIBLE   FOR VALUABLES.  Contacts, dentures or bridgework may not be worn into surgery.  Leave suitcase in the car. After surgery it may be brought to your room.                 Please read over the following fact sheets you were given: _____________________________________________________________________             [facility] - Pre[facility] Before surgery, you can play an important role.  Because skin is not sterile, your skin needs to be as free of germs as possible.  You can reduce the number of germs on your skin by washing with CHG (chlorahexidine gluconate) soap before surgery.  CHG is an antiseptic cleaner which kills germs and bonds with the skin to continue killing germs even after washing. Please DO NOT use if you have an allergy to CHG or antibacterial soaps.  If your skin becomes reddened/irritated stop using the CHG and inform your nurse when you arrive at Short Stay. Do not shave (including legs and underarms) for at least 48 hours prior to the first CHG shower.  You may shave your  face/neck. Please follow these instructions carefully:  1.  Shower with CHG Soap the night before surgery and the  morning of Surgery.  2.  If you choose to wash your hair, wash your hair first as usual with your  normal  shampoo.  3.  After you shampoo, rinse your hair and body thoroughly to remove the  shampoo.                           4.  Use CHG as you would any other liquid soap.  You can apply chg directly  to the skin and wash                       Gently with a scrungie or clean washcloth.  5.  Apply the CHG Soap to your body ONLY FROM THE NECK DOWN.   Do not use  on face/ open                           Wound or open sores. Avoid contact with eyes, ears mouth and genitals (private parts).                       Wash face,  Genitals (private parts) with your normal soap.             6.  Wash thoroughly, paying special attention to the area where your surgery  will be performed.  7.  Thoroughly rinse your body with warm water from the neck down.  8.  DO NOT shower/wash with your normal soap after using and rinsing off  the CHG Soap.                9.  Pat yourself dry with a clean towel.            10.  Wear clean pajamas.            11.  Place clean sheets on your bed the night of your first shower and do not  sleep with pets. Day of Surgery : Do not apply any lotions/deodorants the morning of surgery.  Please wear clean clothes to the hospital/surgery center.  FAILURE TO FOLLOW THESE INSTRUCTIONS MAY RESULT IN THE CANCELLATION OF YOUR SURGERY PATIENT SIGNATURE_________________________________  NURSE SIGNATURE__________________________________  ________________________________________________________________________

## 2018-05-19 NOTE — Progress Notes (Signed)
Chart left with tamika, RN to f/u

## 2018-05-20 ENCOUNTER — Ambulatory Visit: Payer: Self-pay | Admitting: Surgery

## 2018-05-20 NOTE — Pre-Procedure Instructions (Signed)
Spoke with Ms. Messner she had already come by and picked up her pre surgical drink,. She verbalized understanding.

## 2018-05-21 ENCOUNTER — Ambulatory Visit: Payer: Self-pay | Admitting: Surgery

## 2018-05-23 MED ORDER — BUPIVACAINE LIPOSOME 1.3 % IJ SUSP
20.0000 mL | Freq: Once | INTRAMUSCULAR | Status: DC
  Filled 2018-05-23: qty 20

## 2018-05-23 NOTE — H&P (Signed)
[facility]  Location: [facility] Patient #: [id number] DOB: January 31, 1951 Married / Language: English / Race: White Female  History of Present Illness   The patient is a 67 year old female who presents with weight gain.  Her PCP is Dr. Lynnea Ferrier.  She sees Dr. Jodelle Green  GYN - Dr. Kathie Rhodes. Rivard She comes by herself today.  She has been approved for surgery to remove the lap band and convert to sleeve gastrectomy on 05/24/2018. Interestingly, her daughter had a sleeve gastrectomy in [place] about 6 weeks ago. Her daughter lives in [place]. She is doing well. We have been over the options for surgery and the potential complications multiple times through her visits with me. I did remind her that even though this is scheduled as a single stage operation - if the stomach gets too beat up removing the lap band, I would want to delay her sleeve 2 or 3 months for the stomach to recover. I told her this happens about 10% of the time.  Per the 1991 NIH Consensus Statement, the patient is a candidate for bariatric surgery. The patient attended our initial information session and reviewed the types of bariatric surgery.  The patient is interested in converting her lap band to a sleeve gastrectomy. I discussed with the patient the indications and risks of bariatric surgery. The potential risks of surgery include, but are not limited to, bleeding, infection, leak from the bowel, DVT and PE, open surgery, long term nutrition consequences, and death. The patient understands the importance of compliance and long term follow-up with our group after surgery. She saw Dr. Kriste Basque on 09/30/2017. She has decreased the Prednisone to 10 mg alternating with 5 mg.  She has lost over 50 pounds since 03/2015 - but her weight has been flat over the last year. This may be in part due to the Prednisone. Her weight: 365 (04/12/2015)  to 351 (05/17/2015) to 332 lbs. (07/10/2015) to 319 (12/07/2015) to 312 (02/27/2017) 318 (11/06/2017) 305 (04/28/2018) Our goal is to get her to 300 (or about).  Past medical History: 1. Lap band, APS - 05/10/2008 - Dr. Algis Downs. Judith Campillo Note her lap band location is hard to feel - it lays below the scar. 2. Hypertension 3. GERD 4. Hip replacement - Dr. Maureen Ralphs - 2010 She has had both done She saw Dr. Magnus Ivan for chronic righ knee pain . He has injected cortisone. She tried Meloxicam for a while. 5. History of cardiac ablation (the last time was 2014?) Dr. Clydie Braun, [facility], is her usual cardiologist.  Dr. Johney Frame did an ablation in 2014. this was her 3rd time, but it did not work Transport planner in and out of A Flutter and the meds she is on limit her activity. Cardiac clearance from Dr. Sampson Goon [facility]) - okay for surgery, hold eliquis for surgery and restart soon after 5. Thyroid replacement 6. Sleep Apnea With pulmonary hypertension - seen by Dr. Kriste Basque - 04/27/2017 (I have spoken to Dr. Kriste Basque before about revisional surgery - he thought that there was no further eval needed for her lungs) On CPAP  7. Interstitial cystitis 8. Hypothyroid TSH - 5.37 - 01/29/2017 9. Anti-coagulated on Eliquis For A. Flutter 10 Chronic organizing pneumonitis - followed by Dr. Kriste Basque On Prednisone for this - though he is trying to decrease her dose. 11. On home O2 - 24/7 12. Psychiatry - her psychiatrist, Dr. Enzo Bi (female)  Social History: Married. she has three children:  Daughter 36 yo, son 68 yo, daughter 90 yo She has a friend, Silvano Bilis, who has done well with her lap band Father died of Alzheimer's in 04/24/2015.   Allergies (April Staton, CMA; 04/28/2018 11:45  AM) Flecainide Acetate *ANTIARRHYTHMICS*  Moxifloxacin HCl *FLUOROQUINOLONES*  SulfADIAZINE *Sulfonamides**  Cephalexin *CEPHALOSPORINS*  Rocephin *CEPHALOSPORINS*  Penicillin G Sodium *PENICILLINS*   Medication History (April Staton, CMA; 04/28/2018 11:46 AM) ClonazePAM (0.5MG  Tablet, Oral) Active. Furosemide (40MG  Tablet, Oral) Active. Cetirizine HCl (10MG  Tablet, Oral) Active. Diclofenac Sodium (1% Gel, External) Active. Levothyroxine Sodium ( Tablet, Oral) Active. Magnesium (250MG  Tablet, Oral) Active. PredniSONE (20MG  Tablet, Oral) Active. Doxycycline Hyclate (100MG  Tablet, Oral) Active. Amitriptyline HCl (10MG  Tablet, Oral) Active. Diltiazem HCl ER Coated Beads (120MG  Capsule ER 24HR, Oral) Active. Eliquis (5MG  Tablet, Oral) Active. Losartan Potassium (100MG  Tablet, Oral) Active. Metoprolol Tartrate (25MG  Tablet, Oral) Active. ProAir HFA (108 (90 Base)MCG/ACT Aerosol Soln, Inhalation) Active. Synthroid ( Tablet, Oral) Active. Furosemide (20MG  Tablet, Oral) Active. Montelukast Sodium (10MG  Tablet, Oral) Active. Medications Reconciled  Vitals (April Staton CMA; 04/28/2018 11:46 AM) 04/28/2018 11:46 AM Weight: 305.13 lb Height: 65.5in Body Surface Area: 2.38 m Body Mass Index: 50 kg/m  Temp.: 98.9F(Oral)  Pulse: 69 (Regular)  BP: 122/74 (Sitting, Left Arm, Standard)   Physical Exam  General: WN obese WF who is alert. She has her O2 with her today. She has been on 24/7 O2 for about 2 years.  Lymph Nodes: No supraclavicular or cervical nodes.  Lungs: Clear to auscultation and symmetric breath sounds. Her lungs sound good to me. Heart: RRR. No murmur or rub.  Abdomen: Soft. No mass. No tenderness. No hernia. Normal bowel sounds. She is much more of a pear than an apple. Her abdomen is not too large. Lap band port in RUQ. It is slightly inferior to her scar.  Extremities: She has a lot of weight in her  hips and legs. She has been using compression stockings at home for about an hour a day and her lower extremity edema is much better.   Assessment & Plan  1.  OBESITY, MORBID, BMI 50 OR HIGHER (E66.01)  Plan  1) PLan lap band converted to sleeve gastrectomy on 05/24/2018 She understand there is a small chance that this could be a two stage operation.  2) Prescriptions for pain, nausea, and Protonix   Note - she is on an antiarrythmic that is affected by Zofran   3) Hold Eliquis 5 days before surgery  4) Bring CPAP to hospital  2. HISTORY OF LAPAROSCOPIC ADJUSTABLE GASTRIC BANDING (Z98.84)  Story: She had a APS lap band on 05/09/2008.  Impression: 02/27/2017 - Calculated volume of 4.5 cc. 3.  OBSTRUCTIVE SLEEP APNEA ON CPAP (G47.33)  With pulmonary hypertension - seen by Dr. Kriste Basque  4.  HYPOTHYROIDISM, UNSPECIFIED TYPE (E03.9) 5.  ANTICOAGULATED (Z79.01)  Impression: On Eliquis  For A. Flutter 6. Hypertension 7. GERD 8. History of cardiac ablation (the last time was 2014?) Dr. Clydie Braun, [facility], is her usual cardiologist.  Dr. Johney Frame did an ablation in 2014. This was her 3rd time, but it did not work Transport planner in and out of A Flutter and the meds she is on limit her activity. Cardiac clearance from Dr. Sampson Goon [facility]) - okay for surgery, hold eliquis for surgery and restart soon after 8. Interstitial cystitis  9 Chronic organizing pneumonitis - followed by Dr. Kriste Basque  On Prednisone for this - though he is trying to decrease her dose. 10. On home  O2 - 24/7 11. Psychiatry - her psychiatrist, Dr. Enzo BiJeanne Peters (female)    Ovidio Kinavid Almee Pelphrey, MD, [facility] Pager: [phone] Office phone:  [phone]

## 2018-05-23 NOTE — Anesthesia Preprocedure Evaluation (Addendum)
Anesthesia Evaluation  Patient identified by MRN, date of birth, ID band Patient awake    Reviewed: Allergy & Precautions, NPO status , Patient's Chart, lab work & pertinent test results, reviewed documented beta blocker date and time   History of Anesthesia Complications Negative for: history of anesthetic complications  Airway Mallampati: II  TM Distance: >3 FB Neck ROM: Full    Dental  (+) Dental Advisory Given, Teeth Intact   Pulmonary shortness of breath and Long-Term Oxygen Therapy, sleep apnea, Continuous Positive Airway Pressure Ventilation and Oxygen sleep apnea ,    breath sounds clear to auscultation       Cardiovascular hypertension, Pt. on home beta blockers and Pt. on medications + Peripheral Vascular Disease and +CHF  + dysrhythmias Atrial Fibrillation  Rhythm:Regular Rate:Bradycardia   Pulmonary HTN  '19 TTE - EF 60% to 65%. Aortic sclerosis, mild AI. The ascending aorta was mildly dilated, 39 mm. Mild MR. RV cavity size was mildly dilated. RA was severely dilated. Moderate TR, mild PR. PASP 76 mm Hg    Neuro/Psych negative neurological ROS  negative psych ROS   GI/Hepatic Neg liver ROS, GERD  Controlled, IBS    Endo/Other  Hypothyroidism Morbid obesity  Renal/GU negative Renal ROS  negative genitourinary   Musculoskeletal  (+) Arthritis ,   Abdominal (+) + obese,   Peds  Hematology negative hematology ROS (+)   Anesthesia Other Findings Anaphylaxis to ceftriaxone  Reproductive/Obstetrics                            Anesthesia Physical Anesthesia Plan  ASA: IV  Anesthesia Plan: General   Post-op Pain Management:    Induction: Intravenous  PONV Risk Score and Plan: 3 and Treatment may vary due to age or medical condition, Ondansetron and Dexamethasone  Airway Management Planned: Oral ETT  Additional Equipment: None  Intra-op Plan:   Post-operative Plan:  Extubation in OR  Informed Consent: I have reviewed the patients History and Physical, chart, labs and discussed the procedure including the risks, benefits and alternatives for the proposed anesthesia with the patient or authorized representative who has indicated his/her understanding and acceptance.   Dental advisory given  Plan Discussed with: CRNA and Anesthesiologist  Anesthesia Plan Comments: (Will have vasopressin available for hypotension given history of significant pulmonary hypertension)       Anesthesia Quick Evaluation

## 2018-05-24 ENCOUNTER — Encounter (HOSPITAL_COMMUNITY): Payer: Self-pay | Admitting: *Deleted

## 2018-05-24 ENCOUNTER — Other Ambulatory Visit: Payer: Self-pay

## 2018-05-24 ENCOUNTER — Inpatient Hospital Stay (HOSPITAL_COMMUNITY): Payer: Medicare Other | Admitting: Anesthesiology

## 2018-05-24 ENCOUNTER — Encounter (HOSPITAL_COMMUNITY)
Admission: RE | Disposition: A | Discharge status: Home / Self Care | Payer: Self-pay | Source: Home / Self Care | Attending: Surgery

## 2018-05-24 ENCOUNTER — Inpatient Hospital Stay (HOSPITAL_COMMUNITY)
Admission: RE | Admit: 2018-05-24 | Discharge: 2018-05-25 | DRG: 620 | Disposition: A | Discharge status: Home / Self Care | Payer: Medicare Other | Attending: Surgery | Admitting: Surgery

## 2018-05-24 DIAGNOSIS — I272 Pulmonary hypertension, unspecified: Secondary | ICD-10-CM | POA: Diagnosis present

## 2018-05-24 DIAGNOSIS — Z881 Allergy status to other antibiotic agents status: Secondary | ICD-10-CM

## 2018-05-24 DIAGNOSIS — Z7901 Long term (current) use of anticoagulants: Secondary | ICD-10-CM | POA: Diagnosis not present

## 2018-05-24 DIAGNOSIS — Z79890 Hormone replacement therapy (postmenopausal): Secondary | ICD-10-CM

## 2018-05-24 DIAGNOSIS — K449 Diaphragmatic hernia without obstruction or gangrene: Secondary | ICD-10-CM | POA: Diagnosis present

## 2018-05-24 DIAGNOSIS — K219 Gastro-esophageal reflux disease without esophagitis: Secondary | ICD-10-CM | POA: Diagnosis present

## 2018-05-24 DIAGNOSIS — E039 Hypothyroidism, unspecified: Secondary | ICD-10-CM | POA: Diagnosis present

## 2018-05-24 DIAGNOSIS — Z96643 Presence of artificial hip joint, bilateral: Secondary | ICD-10-CM | POA: Diagnosis present

## 2018-05-24 DIAGNOSIS — I4892 Unspecified atrial flutter: Secondary | ICD-10-CM | POA: Diagnosis present

## 2018-05-24 DIAGNOSIS — N3010 Interstitial cystitis (chronic) without hematuria: Secondary | ICD-10-CM | POA: Diagnosis present

## 2018-05-24 DIAGNOSIS — I10 Essential (primary) hypertension: Secondary | ICD-10-CM | POA: Diagnosis present

## 2018-05-24 DIAGNOSIS — G4733 Obstructive sleep apnea (adult) (pediatric): Secondary | ICD-10-CM | POA: Diagnosis present

## 2018-05-24 DIAGNOSIS — Z7952 Long term (current) use of systemic steroids: Secondary | ICD-10-CM | POA: Diagnosis not present

## 2018-05-24 DIAGNOSIS — Z4651 Encounter for fitting and adjustment of gastric lap band: Secondary | ICD-10-CM

## 2018-05-24 DIAGNOSIS — Z882 Allergy status to sulfonamides status: Secondary | ICD-10-CM

## 2018-05-24 DIAGNOSIS — Z6841 Body Mass Index (BMI) 40.0 and over, adult: Secondary | ICD-10-CM

## 2018-05-24 DIAGNOSIS — Z888 Allergy status to other drugs, medicaments and biological substances status: Secondary | ICD-10-CM

## 2018-05-24 DIAGNOSIS — Z87892 Personal history of anaphylaxis: Secondary | ICD-10-CM

## 2018-05-24 DIAGNOSIS — Z9981 Dependence on supplemental oxygen: Secondary | ICD-10-CM

## 2018-05-24 DIAGNOSIS — J8489 Other specified interstitial pulmonary diseases: Secondary | ICD-10-CM | POA: Diagnosis present

## 2018-05-24 DIAGNOSIS — Z880 Allergy status to penicillin: Secondary | ICD-10-CM

## 2018-05-24 HISTORY — PX: LAPAROSCOPIC GASTRIC BAND REMOVAL WITH LAPAROSCOPIC GASTRIC SLEEVE RESECTION: SHX6498

## 2018-05-24 LAB — TYPE AND SCREEN
ABO/RH(D): A POS
Antibody Screen: NEGATIVE

## 2018-05-24 LAB — PROTIME-INR
INR: 0.97
Prothrombin Time: 12.8 seconds (ref 11.4–15.2)

## 2018-05-24 LAB — HEMOGLOBIN AND HEMATOCRIT, BLOOD
HCT: 38.9 % (ref 36.0–46.0)
HEMOGLOBIN: 12.4 g/dL (ref 12.0–15.0)

## 2018-05-24 LAB — MRSA PCR SCREENING: MRSA BY PCR: NEGATIVE

## 2018-05-24 SURGERY — LAPAROSCOPIC GASTRIC BAND REMOVAL WITH LAPAROSCOPIC GASTRIC SLEEVE RESECTION
Anesthesia: General | Site: Abdomen

## 2018-05-24 MED ORDER — OXYCODONE HCL 5 MG/5ML PO SOLN: 5.0000 mg | ORAL | Status: DC | PRN

## 2018-05-24 MED ORDER — BUPIVACAINE-EPINEPHRINE (PF) 0.25% -1:200000 IJ SOLN
INTRAMUSCULAR | Status: AC
  Filled 2018-05-24: qty 30

## 2018-05-24 MED ORDER — CIPROFLOXACIN IN D5W 400 MG/200ML IV SOLN
INTRAVENOUS | Status: DC | PRN
  Administered 2018-05-24: 400 mg via INTRAVENOUS

## 2018-05-24 MED ORDER — DEXAMETHASONE SODIUM PHOSPHATE 10 MG/ML IJ SOLN
INTRAMUSCULAR | Status: DC | PRN
  Administered 2018-05-24: 10 mg via INTRAVENOUS

## 2018-05-24 MED ORDER — LIDOCAINE 20MG/ML (2%) 15 ML SYRINGE OPTIME
INTRAMUSCULAR | Status: DC | PRN
  Administered 2018-05-24: 1.5 mg/kg/h via INTRAVENOUS

## 2018-05-24 MED ORDER — MIDAZOLAM HCL 5 MG/5ML IJ SOLN
INTRAMUSCULAR | Status: DC | PRN
  Administered 2018-05-24: 1 mg via INTRAVENOUS

## 2018-05-24 MED ORDER — KCL IN DEXTROSE-NACL 20-5-0.45 MEQ/L-%-% IV SOLN
INTRAVENOUS | Status: DC
  Administered 2018-05-24 – 2018-05-25 (×2): via INTRAVENOUS
  Filled 2018-05-24 (×2): qty 1000

## 2018-05-24 MED ORDER — PREDNISONE 5 MG PO TABS
5.0000 mg | ORAL_TABLET | Freq: Every day | ORAL | Status: DC
  Administered 2018-05-25: 5 mg via ORAL
  Filled 2018-05-24: qty 1

## 2018-05-24 MED ORDER — CARBOXYMETHYLCELLUL-GLYCERIN 0.5-0.9 % OP SOLN: 2.0000 [drp] | Freq: Every day | OPHTHALMIC | Status: DC | PRN

## 2018-05-24 MED ORDER — CHLORHEXIDINE GLUCONATE 4 % EX LIQD: 60.0000 mL | Freq: Once | CUTANEOUS | Status: DC

## 2018-05-24 MED ORDER — ONDANSETRON HCL 4 MG/2ML IJ SOLN: 4.0000 mg | INTRAMUSCULAR | Status: DC | PRN

## 2018-05-24 MED ORDER — ORAL CARE MOUTH RINSE: 15.0000 mL | Freq: Two times a day (BID) | OROMUCOSAL | Status: DC

## 2018-05-24 MED ORDER — SUGAMMADEX SODIUM 500 MG/5ML IV SOLN
INTRAVENOUS | Status: DC | PRN
  Administered 2018-05-24: 300 mg via INTRAVENOUS

## 2018-05-24 MED ORDER — GLYCOPYRROLATE PF 0.2 MG/ML IJ SOSY
PREFILLED_SYRINGE | INTRAMUSCULAR | Status: AC
  Filled 2018-05-24: qty 1

## 2018-05-24 MED ORDER — HEPARIN SODIUM (PORCINE) 5000 UNIT/ML IJ SOLN
5000.0000 [IU] | INTRAMUSCULAR | Status: AC
  Administered 2018-05-24: 5000 [IU] via SUBCUTANEOUS
  Filled 2018-05-24: qty 1

## 2018-05-24 MED ORDER — ENOXAPARIN SODIUM 30 MG/0.3ML ~~LOC~~ SOLN
30.0000 mg | Freq: Two times a day (BID) | SUBCUTANEOUS | Status: DC
  Administered 2018-05-24 – 2018-05-25 (×2): 30 mg via SUBCUTANEOUS
  Filled 2018-05-24 (×2): qty 0.3

## 2018-05-24 MED ORDER — LORATADINE 10 MG PO TABS: 10.0000 mg | ORAL_TABLET | Freq: Every day | ORAL | Status: DC | PRN

## 2018-05-24 MED ORDER — SUCCINYLCHOLINE CHLORIDE 200 MG/10ML IV SOSY
PREFILLED_SYRINGE | INTRAVENOUS | Status: DC | PRN
  Administered 2018-05-24: 120 mg via INTRAVENOUS

## 2018-05-24 MED ORDER — FENTANYL CITRATE (PF) 100 MCG/2ML IJ SOLN
25.0000 ug | INTRAMUSCULAR | Status: DC | PRN
  Administered 2018-05-24 (×2): 50 ug via INTRAVENOUS

## 2018-05-24 MED ORDER — APREPITANT 40 MG PO CAPS
40.0000 mg | ORAL_CAPSULE | ORAL | Status: AC
  Administered 2018-05-24: 40 mg via ORAL
  Filled 2018-05-24: qty 1

## 2018-05-24 MED ORDER — BUPIVACAINE LIPOSOME 1.3 % IJ SUSP
INTRAMUSCULAR | Status: DC | PRN
  Administered 2018-05-24: 20 mL

## 2018-05-24 MED ORDER — SUGAMMADEX SODIUM 500 MG/5ML IV SOLN
INTRAVENOUS | Status: AC
  Filled 2018-05-24: qty 5

## 2018-05-24 MED ORDER — ONDANSETRON HCL 4 MG/2ML IJ SOLN
INTRAMUSCULAR | Status: DC | PRN
  Administered 2018-05-24: 4 mg via INTRAVENOUS

## 2018-05-24 MED ORDER — MIDAZOLAM HCL 2 MG/2ML IJ SOLN
INTRAMUSCULAR | Status: AC
  Filled 2018-05-24: qty 2

## 2018-05-24 MED ORDER — LIDOCAINE 2% (20 MG/ML) 5 ML SYRINGE
INTRAMUSCULAR | Status: DC | PRN
  Administered 2018-05-24: 100 mg via INTRAVENOUS

## 2018-05-24 MED ORDER — 0.9 % SODIUM CHLORIDE (POUR BTL) OPTIME
TOPICAL | Status: DC | PRN
  Administered 2018-05-24: 1000 mL

## 2018-05-24 MED ORDER — OXYCODONE HCL 5 MG/5ML PO SOLN
5.0000 mg | Freq: Once | ORAL | Status: DC | PRN
  Filled 2018-05-24: qty 5

## 2018-05-24 MED ORDER — MORPHINE SULFATE (PF) 2 MG/ML IV SOLN: 1.0000 mg | INTRAVENOUS | Status: DC | PRN

## 2018-05-24 MED ORDER — LOSARTAN POTASSIUM 50 MG PO TABS: 100.0000 mg | ORAL_TABLET | Freq: Every day | ORAL | Status: DC

## 2018-05-24 MED ORDER — ACETAMINOPHEN 500 MG PO TABS
1000.0000 mg | ORAL_TABLET | ORAL | Status: AC
  Administered 2018-05-24: 1000 mg via ORAL
  Filled 2018-05-24: qty 2

## 2018-05-24 MED ORDER — ACETAMINOPHEN 160 MG/5ML PO SOLN
650.0000 mg | Freq: Four times a day (QID) | ORAL | Status: DC
  Administered 2018-05-24 – 2018-05-25 (×3): 650 mg via ORAL
  Filled 2018-05-24 (×3): qty 20.3

## 2018-05-24 MED ORDER — CIPROFLOXACIN IN D5W 400 MG/200ML IV SOLN
INTRAVENOUS | Status: AC
  Filled 2018-05-24: qty 200

## 2018-05-24 MED ORDER — MONTELUKAST SODIUM 10 MG PO TABS
10.0000 mg | ORAL_TABLET | Freq: Every day | ORAL | Status: DC
  Administered 2018-05-24: 10 mg via ORAL
  Filled 2018-05-24: qty 1

## 2018-05-24 MED ORDER — VASOPRESSIN 20 UNIT/ML IV SOLN
INTRAVENOUS | Status: AC
  Filled 2018-05-24: qty 1

## 2018-05-24 MED ORDER — LACTATED RINGERS IV SOLN
INTRAVENOUS | Status: DC
  Administered 2018-05-24 (×2): via INTRAVENOUS

## 2018-05-24 MED ORDER — FAMOTIDINE IN NACL 20-0.9 MG/50ML-% IV SOLN
20.0000 mg | Freq: Two times a day (BID) | INTRAVENOUS | Status: DC
  Administered 2018-05-24 – 2018-05-25 (×3): 20 mg via INTRAVENOUS
  Filled 2018-05-24 (×3): qty 50

## 2018-05-24 MED ORDER — FENTANYL CITRATE (PF) 100 MCG/2ML IJ SOLN
INTRAMUSCULAR | Status: AC
  Filled 2018-05-24: qty 2

## 2018-05-24 MED ORDER — METOPROLOL TARTRATE 25 MG PO TABS
25.0000 mg | ORAL_TABLET | Freq: Two times a day (BID) | ORAL | Status: DC
  Administered 2018-05-24 – 2018-05-25 (×2): 25 mg via ORAL
  Filled 2018-05-24 (×2): qty 1

## 2018-05-24 MED ORDER — PROPOFOL 10 MG/ML IV BOLUS
INTRAVENOUS | Status: AC
Start: 2018-05-24 — End: ?
  Filled 2018-05-24: qty 20

## 2018-05-24 MED ORDER — POLYVINYL ALCOHOL 1.4 % OP SOLN
2.0000 [drp] | Freq: Every day | OPHTHALMIC | Status: DC | PRN
  Filled 2018-05-24: qty 15

## 2018-05-24 MED ORDER — AMITRIPTYLINE HCL 10 MG PO TABS: 10.0000 mg | ORAL_TABLET | Freq: Every day | ORAL | Status: DC

## 2018-05-24 MED ORDER — DILTIAZEM HCL ER COATED BEADS 120 MG PO CP24
120.0000 mg | ORAL_CAPSULE | Freq: Every day | ORAL | Status: DC
  Administered 2018-05-25: 120 mg via ORAL
  Filled 2018-05-24: qty 1

## 2018-05-24 MED ORDER — FUROSEMIDE 40 MG PO TABS
40.0000 mg | ORAL_TABLET | Freq: Every morning | ORAL | Status: DC
  Administered 2018-05-25: 40 mg via ORAL
  Filled 2018-05-24: qty 1

## 2018-05-24 MED ORDER — GABAPENTIN 300 MG PO CAPS
300.0000 mg | ORAL_CAPSULE | ORAL | Status: AC
  Administered 2018-05-24: 300 mg via ORAL
  Filled 2018-05-24: qty 1

## 2018-05-24 MED ORDER — SCOPOLAMINE 1 MG/3DAYS TD PT72
1.0000 | MEDICATED_PATCH | TRANSDERMAL | Status: DC
  Administered 2018-05-24: 1.5 mg via TRANSDERMAL
  Filled 2018-05-24: qty 1

## 2018-05-24 MED ORDER — FENTANYL CITRATE (PF) 100 MCG/2ML IJ SOLN
INTRAMUSCULAR | Status: AC
  Filled 2018-05-24: qty 4

## 2018-05-24 MED ORDER — ALBUTEROL SULFATE (2.5 MG/3ML) 0.083% IN NEBU: 2.5000 mg | INHALATION_SOLUTION | RESPIRATORY_TRACT | Status: DC | PRN

## 2018-05-24 MED ORDER — ALBUTEROL SULFATE HFA 108 (90 BASE) MCG/ACT IN AERS: 2.0000 | INHALATION_SPRAY | RESPIRATORY_TRACT | Status: DC | PRN

## 2018-05-24 MED ORDER — LOSARTAN POTASSIUM 50 MG PO TABS
100.0000 mg | ORAL_TABLET | Freq: Every day | ORAL | Status: DC
  Administered 2018-05-25: 100 mg via ORAL
  Filled 2018-05-24: qty 2

## 2018-05-24 MED ORDER — LACTATED RINGERS IR SOLN
Status: DC | PRN
  Administered 2018-05-24: 1000 mL

## 2018-05-24 MED ORDER — CLONAZEPAM 0.5 MG PO TABS: 0.2500 mg | ORAL_TABLET | Freq: Every day | ORAL | Status: DC

## 2018-05-24 MED ORDER — HYDROCOD POLST-CPM POLST ER 10-8 MG/5ML PO SUER: 5.0000 mL | Freq: Two times a day (BID) | ORAL | Status: DC | PRN

## 2018-05-24 MED ORDER — STERILE WATER FOR IRRIGATION IR SOLN
Status: DC | PRN
  Administered 2018-05-24: 500 mL

## 2018-05-24 MED ORDER — BUPIVACAINE-EPINEPHRINE 0.25% -1:200000 IJ SOLN
INTRAMUSCULAR | Status: DC | PRN
  Administered 2018-05-24: 30 mL

## 2018-05-24 MED ORDER — PROPOFOL 10 MG/ML IV BOLUS
INTRAVENOUS | Status: DC | PRN
  Administered 2018-05-24: 140 mg via INTRAVENOUS

## 2018-05-24 MED ORDER — FENTANYL CITRATE (PF) 100 MCG/2ML IJ SOLN
INTRAMUSCULAR | Status: DC | PRN
  Administered 2018-05-24 (×2): 25 ug via INTRAVENOUS

## 2018-05-24 MED ORDER — PREMIER PROTEIN SHAKE
2.0000 [oz_av] | ORAL | Status: DC
  Administered 2018-05-25 (×2): 2 [oz_av] via ORAL
  Filled 2018-05-24 (×5): qty 325.31

## 2018-05-24 MED ORDER — GLYCOPYRROLATE PF 0.2 MG/ML IJ SOSY
PREFILLED_SYRINGE | INTRAMUSCULAR | Status: DC | PRN
  Administered 2018-05-24: .2 mg via INTRAVENOUS

## 2018-05-24 MED ORDER — TRAMADOL HCL 50 MG PO TABS
50.0000 mg | ORAL_TABLET | Freq: Three times a day (TID) | ORAL | Status: DC | PRN
  Administered 2018-05-24 – 2018-05-25 (×2): 50 mg via ORAL
  Filled 2018-05-24 (×2): qty 1

## 2018-05-24 MED ORDER — PHENYLEPHRINE HCL 10 MG/ML IJ SOLN
INTRAMUSCULAR | Status: DC | PRN
  Administered 2018-05-24: 10 ug/min via INTRAVENOUS

## 2018-05-24 MED ORDER — OXYCODONE HCL 5 MG PO TABS: 5.0000 mg | ORAL_TABLET | Freq: Once | ORAL | Status: DC | PRN

## 2018-05-24 MED ORDER — ROCURONIUM BROMIDE 10 MG/ML (PF) SYRINGE
PREFILLED_SYRINGE | INTRAVENOUS | Status: DC | PRN
  Administered 2018-05-24: 50 mg via INTRAVENOUS
  Administered 2018-05-24 (×2): 10 mg via INTRAVENOUS

## 2018-05-24 MED ORDER — ONDANSETRON HCL 4 MG/2ML IJ SOLN: 4.0000 mg | Freq: Once | INTRAMUSCULAR | Status: DC | PRN

## 2018-05-24 MED ORDER — TISSEEL VH 10 ML EX KIT
PACK | CUTANEOUS | Status: AC
  Filled 2018-05-24: qty 1

## 2018-05-24 MED ORDER — PANTOPRAZOLE SODIUM 40 MG PO TBEC
40.0000 mg | DELAYED_RELEASE_TABLET | Freq: Every day | ORAL | Status: DC
  Administered 2018-05-25: 40 mg via ORAL
  Filled 2018-05-24: qty 1

## 2018-05-24 SURGICAL SUPPLY — 70 items
ADH SKN CLS APL DERMABOND .7 (GAUZE/BANDAGES/DRESSINGS) ×1
APL SRG 32X5 SNPLK LF DISP (MISCELLANEOUS)
APL SWBSTK 6 STRL LF DISP (MISCELLANEOUS)
APPLICATOR COTTON TIP 6 STRL (MISCELLANEOUS) IMPLANT
APPLICATOR COTTON TIP 6IN STRL (MISCELLANEOUS)
APPLIER CLIP ROT 10 11.4 M/L (STAPLE)
APPLIER CLIP ROT 13.4 12 LRG (CLIP)
APR CLP LRG 13.4X12 ROT 20 MLT (CLIP)
APR CLP MED LRG 11.4X10 (STAPLE)
BLADE SURG 15 STRL LF DISP TIS (BLADE) ×1 IMPLANT
BLADE SURG 15 STRL SS (BLADE) ×3
CABLE HIGH FREQUENCY MONO STRZ (ELECTRODE) ×2 IMPLANT
CHLORAPREP W/TINT 26ML (MISCELLANEOUS) ×3 IMPLANT
CLIP APPLIE ROT 10 11.4 M/L (STAPLE) IMPLANT
CLIP APPLIE ROT 13.4 12 LRG (CLIP) IMPLANT
DERMABOND ADVANCED (GAUZE/BANDAGES/DRESSINGS) ×2
DERMABOND ADVANCED .7 DNX12 (GAUZE/BANDAGES/DRESSINGS) ×1 IMPLANT
DEVICE SUT QUICK LOAD TK 5 (STAPLE) IMPLANT
DEVICE SUT TI-KNOT TK 5X26 (MISCELLANEOUS) IMPLANT
DEVICE SUTURE ENDOST 10MM (ENDOMECHANICALS) IMPLANT
DEVICE TI KNOT TK5 (MISCELLANEOUS)
DISSECTOR BLUNT TIP ENDO 5MM (MISCELLANEOUS) IMPLANT
DRAPE UTILITY XL STRL (DRAPES) ×6 IMPLANT
ELECT REM PT RETURN 15FT ADLT (MISCELLANEOUS) ×3 IMPLANT
GAUZE SPONGE 4X4 12PLY STRL (GAUZE/BANDAGES/DRESSINGS) IMPLANT
GLOVE SURG SIGNA 7.5 PF LTX (GLOVE) ×3 IMPLANT
GOWN STRL REUS W/TWL XL LVL3 (GOWN DISPOSABLE) ×11 IMPLANT
GRASPER SUT TROCAR 14GX15 (MISCELLANEOUS) IMPLANT
HOVERMATT SINGLE USE (MISCELLANEOUS) ×3 IMPLANT
KIT BASIN OR (CUSTOM PROCEDURE TRAY) ×3 IMPLANT
MARKER SKIN DUAL TIP RULER LAB (MISCELLANEOUS) ×3 IMPLANT
NDL SPNL 22GX3.5 QUINCKE BK (NEEDLE) ×1 IMPLANT
NEEDLE SPNL 22GX3.5 QUINCKE BK (NEEDLE) ×3 IMPLANT
PACK UNIVERSAL I (CUSTOM PROCEDURE TRAY) ×3 IMPLANT
QUICK LOAD TK 5 (STAPLE)
RELOAD STAPLE 60 3.6 BLU REG (STAPLE) IMPLANT
RELOAD STAPLE 60 3.8 GOLD REG (STAPLE) IMPLANT
RELOAD STAPLE 60 4.1 GRN THCK (STAPLE) ×2 IMPLANT
RELOAD STAPLER BLUE 60MM (STAPLE) IMPLANT
RELOAD STAPLER GOLD 60MM (STAPLE) ×3 IMPLANT
RELOAD STAPLER GREEN 60MM (STAPLE) ×2 IMPLANT
SCISSORS LAP 5X35 DISP (ENDOMECHANICALS) ×3 IMPLANT
SEALANT SURGICAL APPL DUAL CAN (MISCELLANEOUS) ×1 IMPLANT
SET IRRIG TUBING LAPAROSCOPIC (IRRIGATION / IRRIGATOR) ×3 IMPLANT
SHEARS HARMONIC ACE PLUS 45CM (MISCELLANEOUS) ×3 IMPLANT
SLEEVE ADV FIXATION 5X100MM (TROCAR) ×6 IMPLANT
SLEEVE GASTRECTOMY 36FR VISIGI (MISCELLANEOUS) ×3 IMPLANT
SOLUTION ANTI FOG 6CC (MISCELLANEOUS) ×3 IMPLANT
SPONGE LAP 18X18 RF (DISPOSABLE) ×3 IMPLANT
STAPLER ECHELON BIOABSB 60 FLE (MISCELLANEOUS) ×10 IMPLANT
STAPLER ECHELON LONG 60 440 (INSTRUMENTS) ×3 IMPLANT
STAPLER RELOAD BLUE 60MM (STAPLE)
STAPLER RELOAD GOLD 60MM (STAPLE) ×9
STAPLER RELOAD GREEN 60MM (STAPLE) ×6
SUT MNCRL AB 4-0 PS2 18 (SUTURE) ×5 IMPLANT
SUT SURGIDAC NAB ES-9 0 48 120 (SUTURE) IMPLANT
SUT VIC AB 2-0 SH 27 (SUTURE) ×3
SUT VIC AB 2-0 SH 27X BRD (SUTURE) IMPLANT
SUT VICRYL 0 TIES 12 18 (SUTURE) IMPLANT
SYR 10ML ECCENTRIC (SYRINGE) ×3 IMPLANT
SYR CONTROL 10ML LL (SYRINGE) ×3 IMPLANT
TOWEL OR 17X26 10 PK STRL BLUE (TOWEL DISPOSABLE) ×3 IMPLANT
TOWEL OR NON WOVEN STRL DISP B (DISPOSABLE) ×3 IMPLANT
TROCAR ADV FIXATION 12X100MM (TROCAR) ×3 IMPLANT
TROCAR ADV FIXATION 5X100MM (TROCAR) ×3 IMPLANT
TROCAR BLADELESS 15MM (ENDOMECHANICALS) ×3 IMPLANT
TROCAR BLADELESS OPT 5 100 (ENDOMECHANICALS) ×3 IMPLANT
TUBING CONNECTING 10 (TUBING) ×3 IMPLANT
TUBING CONNECTING 10' (TUBING) ×2
TUBING INSUF HEATED (TUBING) ×3 IMPLANT

## 2018-05-24 NOTE — Op Note (Signed)
Procedure: Upper GI endoscopy  Description of procedure: Upper GI endoscopy is performed at the completion of laparoscopic sleeve gastrectomy by Dr.  Ezzard StandingNewman.  The video endoscope was introduced into the upper esophagus and then passed to the EG junction at about 40 cm. The esophagus was mildly dilated and tortuous.  There appeared to be a small, about 2cm, hiatal hernia. The gastric sleeve was entered. The sleeve was tensely distended with air while the outlet was obstructed under saline irrigation by the operating surgeon. There was no evidence of leak. The staple line was intact and without bleeding. The scope was advanced to the antrum and pylorus visualized. There was no stricture or twisting or mucosal abnormality, and particularly no narrowing noted at the incisura.  The pouch was then desufflated and the scope withdrawn.  Mariella SaaBenjamin T Dulcie Gammon MD, FACS  05/24/2018, 9:30 AM

## 2018-05-24 NOTE — Op Note (Addendum)
PATIENT:   Heather EatonCynthia M [name] DOB:   04/16/1951 MRN:   [id number]  DATE OF PROCEDURE: 05/24/2018                   [facility]  OPERATIVE REPORT  PREOPERATIVE DIAGNOSIS:  Morbid obesity, lap band that did not control her obesity  POSTOPERATIVE DIAGNOSIS:  Morbid obesity (weight 305, BMI of 50), lap band that did not control her obesity, small hiatal hernia  PROCEDURE:  Laparoscopic removal of lap band, sleeve gastrectomy (intraoperative upper endoscopy by Dr. Johna SheriffHoxworth)  SURGEON:  Sandria Balesavid H. Ezzard StandingNewman, MDdsf  FIRST ASSISTANT:  B.Hoxworth, MD  ANESTHESIA:  General endotracheal.  Anesthesiologist: Beryle LatheBrock, Thomas E, MD CRNA: Doran ClayAlday, Stephen R, CRNA; Elyn PeersAllen, Sandra J, CRNA  General  ESTIMATED BLOOD LOSS:  Minimal.  LOCAL ANESTHESIA:  30 cc of 1/4% Marcaine and 20 cc of Exparel  COMPLICATIONS:  None.  INDICATION FOR SURGERY:  Heather [name] is a 67 y.o. white female who sees [name], Priscille HeidelbergWarren T, MD as her primary care doctor.  She has completed our preoperative bariatric program and now comes for a laparoscopic sleeve gastrectomy.  The indications, potential complications of surgery were explained to the patient.  Potential complications of the surgery include, but are not limited to, bleeding, infection, DVT, open surgery, and long-term nutritional consequences.  OPERATIVE NOTE:  The patient taken to [facility] where [name] underwent a general endotracheal anesthetic, supervised by Anesthesiologist: Beryle LatheBrock, Thomas E, MD CRNA: Doran ClayAlday, Stephen R, CRNA; Elyn PeersAllen, Sandra J, CRNA.  The patient was given Cipro at the beginning of the procedure.  A time-out was held and surgical checklist run.  I accessed her abdominal cavity through the left upper quadrant with a 5 mm Optiview. I did an abdominal exploration.   Her omentum and bowel were unremarkable. The right and left lobes of the liver unremarkable. Gallbladder was normal. Her stomach was unremarkable.   She had some adhesions to the  anterior peritoneal surface.  I placed a total of 7 trocars. I placed a 5 mm left lateral trocar, a 5 mm left paramedian trocar (for the scope), a 15 mm right paramedian torcar, a 5 mm right subcostal trocar, a 12 mm left lower abdominal trocar that I used to fire one load of the staples to get the right angle, and 5 mm subxiphoid trocar for the liver retractor.  I placed a abdominal wall anesthetic block using a mixture of 1/4% Marcaine and Exparel.  I used 20 cc per side, for a total of 40 cc.  First, I had to remove the lap band. I took down adhesions around the tubing with the Harmonic Scalpel. The lap band was under the left lobe of the liver with moderate adhesions. I took these adhesions down using both electrocautery, the Harmonic Scalpel, and sharp scissors. I divided the tubing of the lap band. I unbuckled the lap band.  I then cut the lap band and removed it in pieces through the 15 mm trocar. The scar from the lap band was up near the hiatus in the diaphragm. I was not unable to evaluate her for a hiatal hernia laparoscopically. I took down the adhesions with the stomach and then wrapped around the band in the area of the gastric cardia.  After the lap band had been removed, I evaluated the stomach about proceeding with the sleeve gastrectomy. The stomach was not significantly injured while removing the lap band and I thought we could proceed with  the lap sleeve gastrectomy today.  I took down the greater curvature attachments of the stomach. I measured approximately 5 cm proximal from the pylorus and mobilized the greater curvature of the stomach with the Harmonic Scalpel. I took this dissection cranially around the greater curvature of her stomach to the angle of His and the left crus.   After I had mobilized the greater curvature of the stomach, I then passed the 36 French ViSiGi bougie which was used to suck the stomach up against the lesser curvature and placed into the antrum. During  the staple firing,  I tried to give the ViSiGi a cuff at least about 1 cm. I tried to avoid narrowing the incisura. I used a total of 6 staple firings.  From antrum to the angle of His, I used 2 green, 4 gold and 0 blue [place] 60 mm Ethicon staplers. Because she is on chronic anticoagulation, II did use staple line reinforcement.   At each firing of the EndoGIA stapler, I inspected the stomach, anterior wall of the stomach, and underneath to make sure there was no compromise or impingement on to the ViSiGi bougie.   The staple line seemed linear without any corkscrewing of the stomach. Hemostasis was good.   Because I thought we had a good staple line, I then had the ViSiGi was converted to insufflate the pouch. The new stomach pouch was placed under water. There was no bubbling or leak noted.   At this point, Dr. Johna SheriffHoxworth broke scrub and passed an upper endoscope down through the esophagus into the stomach pouch. The stomach was tubular. There was no narrowing of the stomach pouch or angulation.  She did appear to have a 2 cm hiatal hernia.We were easily able to pass the endoscope into the antrum and again put air pressure on the staple line. I irrigated the upper abdomen with saline. There was no bubbling or evidence of air leak. The mucosa looked viable. Dr. Johna SheriffHoxworth decompressed the stomach with the endoscopy.   I  extracted the stomach remnant through the right paramedian 15 mm trocar.  I aspirated out the saline that I had irrigated because I thought the staple line looked viable and complete. There was no evidence of leak. I did not leave a drain in place.   I then deflated the peritoneal cavity. I cut down over the port in the right upper quadrant. I removed the remainder of the port.  I closed the 15 mm port with a 0 Vicryl suture using an Endo Close.  Then, I closed the trocar sites. The other port sites seemed smaller not requiring sutures. I closed the skin at each site with a 4-0  Monocryl, painted each wound with LiquiBand.   The patient was transported to recovery room in good condition. Sponge and needle count were correct at the end of the case.    Left picture after lap band removed. Right picture after sleeve gastrectomy   Ovidio Kinavid Dailey Alberson, MD, [facility] Pager: [phone] Office phone:  [phone]

## 2018-05-24 NOTE — Anesthesia Postprocedure Evaluation (Signed)
Anesthesia Post Note  Patient: Darrin NipperCynthia M Savastano  Procedure(s) Performed: LAPAROSCOPIC GASTRIC BAND REMOVAL WITH LAPAROSCOPIC , GASTRIC SLEEVE RESECTION, UPPER ENDO, ERAS Pathway (N/A Abdomen)     Patient location during evaluation: PACU Anesthesia Type: General Level of consciousness: awake and alert Pain management: pain level controlled Vital Signs Assessment: post-procedure vital signs reviewed and stable Respiratory status: spontaneous breathing, nonlabored ventilation, respiratory function stable and patient connected to nasal cannula oxygen Cardiovascular status: blood pressure returned to baseline and stable Postop Assessment: no apparent nausea or vomiting Anesthetic complications: no    Last Vitals:  Vitals:   05/24/18 1115 05/24/18 1130  BP: 126/65 (!) 141/77  Pulse: (!) 57 (!) 55  Resp: (!) 22 (!) 22  Temp: 36.5 C 36.4 C  SpO2: 100% 98%    Last Pain:  Vitals:   05/24/18 1120  TempSrc:   PainSc: Asleep                 Beryle Lathehomas E Brock

## 2018-05-24 NOTE — Transfer of Care (Signed)
Immediate Anesthesia Transfer of Care Note  Patient: Heather NipperCynthia M [facility] [name]  Procedure(s) Performed: LAPAROSCOPIC GASTRIC BAND REMOVAL WITH LAPAROSCOPIC , GASTRIC SLEEVE RESECTION, UPPER ENDO, ERAS Pathway (N/A Abdomen)  Patient Location: PACU  Anesthesia Type:General  Level of Consciousness: sedated  Airway & Oxygen Therapy: Patient Spontanous Breathing and Patient connected to face mask oxygen  Post-op Assessment: Report given to RN and Post -op Vital signs reviewed and stable  Post vital signs: Reviewed and stable  Last Vitals:  Vitals Value Taken Time  BP 127/80 05/24/2018 10:07 AM  Temp    Pulse 55 05/24/2018 10:08 AM  Resp 15 05/24/2018 10:08 AM  SpO2 100 % 05/24/2018 10:08 AM  Vitals shown include unvalidated device data.  Last Pain:  Vitals:   05/24/18 0621  TempSrc:   PainSc: 0-No pain      Patients Stated Pain Goal: 3 (05/24/18 [id number])  Complications: No apparent anesthesia complications

## 2018-05-24 NOTE — Interval H&P Note (Signed)
History and Physical Interval Note:  05/24/2018 7:10 AM  Heather [name]  has presented today for surgery, with the diagnosis of Morbid Obesity, Gastric Banding Status, HTN, GERD, Pulmonrty HTN, A FIb  The various methods of treatment have been discussed with the patient and family.  Her husband is at the bedside.  After consideration of risks, benefits and other options for treatment, the patient has consented to  Procedure(s): LAPAROSCOPIC GASTRIC BAND REMOVAL WITH LAPAROSCOPIC , GASTRIC SLEEVE RESECTION, UPPER ENDO, ERAS Pathway (N/A) as a surgical intervention .  The patient's history has been reviewed, patient examined, no change in status, stable for surgery.  I have reviewed the patient's chart and labs.  Questions were answered to the patient's satisfaction.     Kandis Cockingavid H Taelar Gronewold

## 2018-05-24 NOTE — Anesthesia Procedure Notes (Signed)
Procedure Name: Intubation Date/Time: 05/24/2018 7:35 AM Performed by: Lind Covert, CRNA Pre-anesthesia Checklist: Patient identified, Emergency Drugs available, Suction available, Patient being monitored and Timeout performed Patient Re-evaluated:Patient Re-evaluated prior to induction Oxygen Delivery Method: Circle system utilized Preoxygenation: Pre-oxygenation with 100% oxygen Induction Type: IV induction Ventilation: Mask ventilation without difficulty Laryngoscope Size: Mac and 3 Grade View: Grade I Tube type: Oral Tube size: 7.0 mm Number of attempts: 1 Airway Equipment and Method: Stylet Placement Confirmation: ETT inserted through vocal cords under direct vision,  positive ETCO2 and breath sounds checked- equal and bilateral Secured at: 21 cm Tube secured with: Tape Dental Injury: Teeth and Oropharynx as per pre-operative assessment

## 2018-05-24 NOTE — Progress Notes (Addendum)
PHARMACY CONSULT FOR:  Risk Assessment for Post-Discharge VTE Following Bariatric Surgery  Post-Discharge VTE Risk Assessment: This patient's probability of 30-day post-discharge VTE is increased due to:   Female  X  Age >/=60 years  X  BMI >/=50 kg/m2    CHF    Dyspnea at Rest    Paraplegia   X Non-gastric-band surgery    Operation Time >/=3 hr    Return to OR     Length of Stay >/= 3 d   Predicted probability of 30-day post-discharge VTE: 0.52%  Other patient-specific factors to consider:  Chronic anticoagulation with apixaban for paroxysmal atrial fibrillation According to H&P, cardiology's recommendation was to resume apixaban post-operatively    Recommendation for Discharge:  In this setting would normally recommend enoxaparin 60 mg [place] q12h post-discharge for VTE prophylaxis and transition to warfarin for atrial fibrillation, but plans per cardiology for resuming apixaban are noted.   Given potential for reduced absorption of apixaban following bariatric surgery, suggest considering post-discharge apixaban peak and trough level by cardiology.  Heather EatonCynthia M [name] is a 67 y.o. female who underwent  laparoscopic sleeve gastrectomy on 05/24/18.   Case start: 0758 Case end: 0959   Allergies  Allergen Reactions  . Cephalexin Anaphylaxis and Hives    "throat started closing up"  . Rocephin [Ceftriaxone Sodium In Dextrose] Anaphylaxis  . Penicillins Rash    Has patient had a PCN reaction causing immediate rash, facial/tongue/throat swelling, SOB or lightheadedness with hypotension: no Has patient had a PCN reaction causing severe rash involving mucus membranes or skin necrosis: no Has patient had a PCN reaction that required hospitalization no Has patient had a PCN reaction occurring within the last 10 years: no If all of the above answers are "NO", then may proceed with Cephalosporin use.  . Flecainide Acetate Other (See Comments)    Jittery   . Moxifloxacin     Does not  remember the reaction  . Sulfonamide Derivatives     Does not remember reaction ; "was so young when I had reaction to it"    Patient Measurements: Height: 5\' 5"  (165.1 cm) Weight: (!) 304 lb 2 oz (138 kg) IBW/kg (Calculated) : 57 Body mass index is 50.61 kg/m.  No results for input(s): WBC, HGB, HCT, PLT, APTT, CREATININE, LABCREA, CREATININE, CREAT24HRUR, MG, PHOS, ALBUMIN, PROT, ALBUMIN, AST, ALT, ALKPHOS, BILITOT, BILIDIR, IBILI in the last 72 hours. Estimated Creatinine Clearance: 66.5 mL/min (A) (by C-G formula based on SCr of 1.19 mg/dL (H)).    Past Medical History:  Diagnosis Date  . Anemia    "onc;e; had to take iron for awhile"  . Atrial fibrillation (HCC)    Began in 2004.  [facility].  She is off coumadin  now with no noted recurrent atrial fibrillation since 2007. til 04/01/12; s/p initiation of Rhythmol with DCCV June 2013; reports 05-18-18 that she is in aflutter   . Carpal tunnel syndrome   . Degenerative disk disease   . GERD (gastroesophageal reflux disease)    resolved after lap band  . History of blood transfusion    "w/both hip replacements"  . HTN (hypertension)   . Hypothyroidism   . IBS (irritable bowel syndrome)   . Interstitial cystitis   . Morton's neuroma    right foot  . Obesity   . On home oxygen therapy    2L continuous daytime; at night uses 3L with CPAP at nighttime   . Pneumonitis  dx 1 year ago   cryptogenic organizing pneumonitis; treated with prednisone   . Pulmonary hypertension (HCC)   . Shortness of breath    only without oxygen  . Sleep apnea 04/01/12   "dx'd just last week"  . Sleep apnea    uses CPAP, 11 CWP with residual AHI 6.3     Medications Prior to Admission  Medication Sig Dispense Refill Last Dose  . amitriptyline (ELAVIL) 10 MG tablet TAKE 1 TABLET (10 MG TOTAL) BY MOUTH AT BEDTIME. 90 tablet 3 05/23/2018 at Unknown time  . cetirizine (ZYRTEC) 10 MG tablet Take 10 mg by mouth at  bedtime.    05/23/2018 at Unknown time  . Cholecalciferol (VITAMIN D) 2000 UNITS tablet Take 2,000 Units by mouth daily.   05/23/2018 at Unknown time  . clonazePAM (KLONOPIN) 0.5 MG tablet Take 1/2 to 1 tablet by mouth two times daily (Patient taking differently: Take 0.25-0.5 mg by mouth at bedtime. ) 60 tablet 3 05/23/2018 at Unknown time  . diclofenac sodium (VOLTAREN) 1 % GEL Apply 2 g topically 4 (four) times daily. (Patient taking differently: Apply 2 g topically 3 (three) times daily as needed (pain). ) 100 g 3 05/23/2018 at Unknown time  . diltiazem (CARDIZEM CD) 120 MG 24 hr capsule Take 120mg  by mouth daily   05/24/2018 at 0430  . dronedarone (MULTAQ) 400 MG tablet Take 400 mg by mouth 2 (two) times daily with a meal.   05/24/2018 at 0430  . ELIQUIS 5 MG TABS tablet TAKE 1 TABLET BY MOUTH TWICE A DAY....NEED OFFICE VISIT BEFORE ANY MORE REFILLS 60 tablet 6 05/18/2018  . furosemide (LASIX) 40 MG tablet Take 2 tablets (80 mg total) by mouth every morning. 60 tablet 5 05/23/2018 at Unknown time  . guaiFENesin (MUCINEX) 600 MG 12 hr tablet Take 600 mg by mouth 2 (two) times daily as needed (allergies).    05/22/2018  . levothyroxine (SYNTHROID, LEVOTHROID) 75 MCG tablet Take 1 tablet (75 mcg total) by mouth daily before breakfast. (Patient taking differently: Take 75 mcg by mouth at bedtime. ) 30 tablet 11 05/23/2018 at Unknown time  . losartan (COZAAR) 100 MG tablet TAKE 1 TABLET BY MOUTH  DAILY 90 tablet 1 05/23/2018 at Unknown time  . Magnesium 250 MG TABS Take 250 mg by mouth 2 (two) times daily.   05/23/2018 at Unknown time  . metoprolol tartrate (LOPRESSOR) 25 MG tablet Take 25 mg by mouth 2 (two) times daily.   11 05/24/2018 at 0430  . montelukast (SINGULAIR) 10 MG tablet Take 1 tablet (10 mg total) by mouth at bedtime. 30 tablet 3 05/23/2018 at Unknown time  . predniSONE (DELTASONE) 5 MG tablet 5mg  every AM (Patient taking differently: Take 5 mg by mouth daily. ) 100 tablet 1 05/24/2018 at 0430  .  traMADol (ULTRAM) 50 MG tablet Take 1 tablet (50 mg total) by mouth 3 (three) times daily. (Patient taking differently: Take 50 mg by mouth 3 (three) times daily as needed for moderate pain. ) 50 tablet 3 05/23/2018 at Unknown time  . albuterol (PROVENTIL HFA;VENTOLIN HFA) 108 (90 Base) MCG/ACT inhaler INHALE 2 PUFFS INTO THE LUNGS EVERY 4 HOURS AS NEEDED FOR WHEEZING/SHORTNESS OF BREATH (Patient not taking: Reported on 05/11/2018) 8.5 Inhaler 0 Not Taking at Unknown time  . Carboxymethylcellul-Glycerin (LUBRICATING EYE DROPS OP) Place 1 drop into both eyes daily as needed (dry eyes).   More than a month at Unknown time  . chlorpheniramine-HYDROcodone (TUSSIONEX PENNKINETIC ER) 10-8 MG/5ML  SUER Take 5 mLs by mouth every 12 (twelve) hours as needed for cough. (Patient not taking: Reported on 05/11/2018) 140 mL 0 Completed Course at Unknown time  . HYDROcodone-homatropine (HYCODAN) 5-1.5 MG/5ML syrup Take 5 mLs by mouth every 6 (six) hours as needed for cough. (Patient not taking: Reported on 05/11/2018) 140 mL 0 Completed Course at Unknown time  . pantoprazole (PROTONIX) 40 MG tablet Take 40 mg by mouth daily.  2 not started       CiscoBrooke A Baggett 05/24/2018,7:40 AM   Elie Goodyandy Mickey Hebel, PharmD, BCPS [phone] 05/24/2018  11:38 AM

## 2018-05-24 NOTE — Progress Notes (Signed)
Discussed patient plan of care with patient, spouse, and bedside RN.  Contact information provided along with daily progression after bariatric surgery.  Criteria for fluid to include ambulation, stable vital signs, and urine.  No questions at this time.  Will continue to monitor.

## 2018-05-25 ENCOUNTER — Encounter (HOSPITAL_COMMUNITY): Payer: Self-pay | Admitting: Surgery

## 2018-05-25 LAB — CBC WITH DIFFERENTIAL/PLATELET
BASOS ABS: 0 10*3/uL (ref 0.0–0.1)
Basophils Relative: 0 %
Eosinophils Absolute: 0 10*3/uL (ref 0.0–0.7)
Eosinophils Relative: 0 %
HCT: 36 % (ref 36.0–46.0)
Hemoglobin: 11.5 g/dL — ABNORMAL LOW (ref 12.0–15.0)
Lymphocytes Relative: 7 %
Lymphs Abs: 0.6 10*3/uL — ABNORMAL LOW (ref 0.7–4.0)
MCH: 29.4 pg (ref 26.0–34.0)
MCHC: 31.9 g/dL (ref 30.0–36.0)
MCV: 92.1 fL (ref 78.0–100.0)
MONO ABS: 0.4 10*3/uL (ref 0.1–1.0)
Monocytes Relative: 4 %
Neutro Abs: 7.5 10*3/uL (ref 1.7–7.7)
Neutrophils Relative %: 89 %
Platelets: 224 10*3/uL (ref 150–400)
RBC: 3.91 MIL/uL (ref 3.87–5.11)
RDW: 15 % (ref 11.5–15.5)
WBC: 8.4 10*3/uL (ref 4.0–10.5)

## 2018-05-25 MED ORDER — PNEUMOCOCCAL VAC POLYVALENT 25 MCG/0.5ML IJ INJ: 0.5000 mL | INJECTION | INTRAMUSCULAR | Status: DC

## 2018-05-25 NOTE — Progress Notes (Signed)
Patient alert and oriented, Post op day 1.  Provided support and encouragement.  Encouraged pulmonary toilet, ambulation and small sips of liquids. Completed 12 ounces of bari clear fluid and began protein shake this am.   All questions answered.  Will continue to monitor.

## 2018-05-25 NOTE — Progress Notes (Addendum)
[id number]/Florina Glas,BSN,RN3,CCM/[phone]/TCF-Dr. Ezzard StandingNewman patient is code 3744 due to less than 24 hours in house/agrees to change to observation./code 44 obtained. Spoke with patient no home needs present understands to follow diet and dc instructions closely and to report any nausea and vomiting to the office.

## 2018-05-25 NOTE — Discharge Summary (Signed)
Physician Discharge Summary  Patient ID:  Heather EatonCynthia M [name]  MRN: [id number]  DOB/AGE: 66-Aug-1952 66 y.o.  Admit date: 05/24/2018 Discharge date: 05/25/2018  Discharge Diagnoses:  1.  OBESITY, MORBID, BMI 50 OR HIGHER (E66.01)             Weight - 305, BMI - 50  2. 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  She has failed to maintain weight loss with the lap band 3.  OBSTRUCTIVE SLEEP APNEA ON CPAP (G47.33)             With pulmonary hypertension - seen by Dr. Kriste BasqueNadel  4.  HYPOTHYROIDISM, UNSPECIFIED TYPE (E03.9) 5.  ANTICOAGULATED (Z79.01)             Impression: On Eliquis             For A. Flutter 6. Hypertension 7. GERD 8. History of cardiac ablation (the last time was 2014?) [facility]id [facility], is her usual cardiologist.  Dr. Johney FrameAllred did an ablation in 2014. This was her 3rd time, but it did not work 8. Interstitial cystitis 9 Chronic organizing pneumonitis - followed by Dr. Kriste BasqueNadel        On Prednisone for this - though he is trying to decrease her dose. 10. On home O2 - 24/7   Active Problems:   Morbid obesity with BMI of 50.0-59.9, adult (HCC)  Operation: Procedure(s): LAPAROSCOPIC GASTRIC BAND REMOVAL and LAPAROSCOPIC GASTRIC SLEEVE RESECTION, UPPER ENDO [facility]  Discharged Condition: good  Hospital Course: Heather EatonCynthia M [name] is an 67 y.o. female whose primary care physician is Donita BrooksPickard, Warren T, MD and who was admitted 05/24/2018 with a chief complaint of morbid obesity with failed lap band.   She was brought to the operating room on 05/24/2018 and underwent  LAPAROSCOPIC GASTRIC BAND REMOVAL and LAPAROSCOPIC GASTRIC SLEEVE RESECTION, UPPER ENDO.   I put her in the step down ICU the night of surgery because of her history of O2 at home.  She has started clear liquids and tolerated her protein drinks. Her husband is at the  bedside. She is ready for discharge. She will restart her Eliquis tomorrow.  The discharge instructions were reviewed with the patient.  Consults: None  Significant Diagnostic Studies: Results for orders placed or performed during the hospital encounter of 05/24/18  MRSA PCR Screening  Result Value Ref Range   MRSA by PCR NEGATIVE NEGATIVE  Protime-INR  Result Value Ref Range   Prothrombin Time 12.8 11.4 - 15.2 seconds   INR 0.97   Hemoglobin and hematocrit, blood  Result Value Ref Range   Hemoglobin 12.4 12.0 - 15.0 g/dL   HCT 40.938.9 81.136.0 - 91.446.0 %  CBC WITH DIFFERENTIAL  Result Value Ref Range   WBC 8.4 4.0 - 10.5 K/uL   RBC 3.91 3.87 - 5.11 MIL/uL   Hemoglobin 11.5 (L) 12.0 - 15.0 g/dL   HCT 78.236.0 95.636.0 - 21.346.0 %   MCV 92.1 78.0 - 100.0 fL   MCH 29.4 26.0 - 34.0 pg   MCHC 31.9 30.0 - 36.0 g/dL   RDW 08.615.0 57.811.5 - 46.915.5 %   Platelets 224 150 - 400 K/uL   Neutrophils Relative % 89 %   Neutro Abs 7.5 1.7 - 7.7 K/uL   Lymphocytes Relative 7 %   Lymphs Abs 0.6 (L) 0.7 - 4.0 K/uL  Monocytes Relative 4 %   Monocytes Absolute 0.4 0.1 - 1.0 K/uL   Eosinophils Relative 0 %   Eosinophils Absolute 0.0 0.0 - 0.7 K/uL   Basophils Relative 0 %   Basophils Absolute 0.0 0.0 - 0.1 K/uL  Type and screen  Result Value Ref Range   ABO/RH(D) A POS    Antibody Screen NEG    Sample Expiration      05/27/2018 Performed at [facility], [address] [place], [place] [address]     No results found.  Discharge Exam:  Vitals:   05/25/18 0725 05/25/18 0800  BP:  (!) 143/63  Pulse:    Resp:  20  Temp: 98 F (36.7 C)   SpO2:  99%    General: Obese WF who is alert. Lungs: Clear to auscultation and symmetric breath sounds. Heart:  RRR. No murmur or rub. Abdomen: Soft.  Normal bowel sounds. Incisions look good.  Discharge Medications:   Allergies as of 05/25/2018      Reactions   Cephalexin Anaphylaxis, Hives   "throat started closing up"   Rocephin  [ceftriaxone Sodium In Dextrose] Anaphylaxis   Penicillins Rash   Has patient had a PCN reaction causing immediate rash, facial/tongue/throat swelling, SOB or lightheadedness with hypotension: no Has patient had a PCN reaction causing severe rash involving mucus membranes or skin necrosis: no Has patient had a PCN reaction that required hospitalization no Has patient had a PCN reaction occurring within the last 10 years: no If all of the above answers are "NO", then may proceed with Cephalosporin use.   Flecainide Acetate Other (See Comments)   Jittery    Moxifloxacin    Does not remember the reaction   Sulfonamide Derivatives    Does not remember reaction ; "was so young when I had reaction to it"      Medication List    TAKE these medications   albuterol 108 (90 Base) MCG/ACT inhaler Commonly known as:  PROVENTIL HFA;VENTOLIN HFA INHALE 2 PUFFS INTO THE LUNGS EVERY 4 HOURS AS NEEDED FOR WHEEZING/SHORTNESS OF BREATH   amitriptyline 10 MG tablet Commonly known as:  ELAVIL TAKE 1 TABLET (10 MG TOTAL) BY MOUTH AT BEDTIME.   cetirizine 10 MG tablet Commonly known as:  ZYRTEC Take 10 mg by mouth at bedtime.   chlorpheniramine-HYDROcodone 10-8 MG/5ML Suer Commonly known as:  TUSSIONEX Take 5 mLs by mouth every 12 (twelve) hours as needed for cough.   clonazePAM 0.5 MG tablet Commonly known as:  KLONOPIN Take 1/2 to 1 tablet by mouth two times daily What changed:    how much to take  how to take this  when to take this  additional instructions   diclofenac sodium 1 % Gel Commonly known as:  VOLTAREN Apply 2 g topically 4 (four) times daily. What changed:    when to take this  reasons to take this   diltiazem 120 MG 24 hr capsule Commonly known as:  CARDIZEM CD Take 120mg  by mouth daily   dronedarone 400 MG tablet Commonly known as:  MULTAQ Take 400 mg by mouth 2 (two) times daily with a meal.   ELIQUIS 5 MG Tabs tablet Generic drug:  apixaban TAKE 1 TABLET  BY MOUTH TWICE A DAY....NEED OFFICE VISIT BEFORE ANY MORE REFILLS   furosemide 40 MG tablet Commonly known as:  LASIX Take 2 tablets (80 mg total) by mouth every morning. Notes to patient:  Monitor Blood Pressure Daily and keep a log  for primary care physician.  Monitor for symptoms of dehydration.  You may need to make changes to your medications with rapid weight loss.     guaiFENesin 600 MG 12 hr tablet Commonly known as:  MUCINEX Take 600 mg by mouth 2 (two) times daily as needed (allergies).   HYDROcodone-homatropine 5-1.5 MG/5ML syrup Commonly known as:  HYCODAN Take 5 mLs by mouth every 6 (six) hours as needed for cough.   levothyroxine 75 MCG tablet Commonly known as:  SYNTHROID, LEVOTHROID Take 1 tablet (75 mcg total) by mouth daily before breakfast. What changed:  when to take this   losartan 100 MG tablet Commonly known as:  COZAAR TAKE 1 TABLET BY MOUTH  DAILY Notes to patient:  Monitor Blood Pressure Daily and keep a log for primary care physician.  You may need to make changes to your medications with rapid weight loss.     LUBRICATING EYE DROPS OP Place 1 drop into both eyes daily as needed (dry eyes).   Magnesium 250 MG Tabs Take 250 mg by mouth 2 (two) times daily.   metoprolol tartrate 25 MG tablet Commonly known as:  LOPRESSOR Take 25 mg by mouth 2 (two) times daily. Notes to patient:  Monitor Blood Pressure Daily and keep a log for primary care physician.  You may need to make changes to your medications with rapid weight loss.     montelukast 10 MG tablet Commonly known as:  SINGULAIR Take 1 tablet (10 mg total) by mouth at bedtime.   pantoprazole 40 MG tablet Commonly known as:  PROTONIX Take 40 mg by mouth daily.   predniSONE 5 MG tablet Commonly known as:  DELTASONE 5mg  every AM What changed:    how much to take  how to take this  when to take this  additional instructions   traMADol 50 MG tablet Commonly known as:  ULTRAM Take 1  tablet (50 mg total) by mouth 3 (three) times daily. What changed:    when to take this  reasons to take this   Vitamin D 2000 units tablet Take 2,000 Units by mouth daily.       Disposition: Discharge disposition: 01-Home or Self Care       Discharge Instructions    Ambulate hourly while awake   Complete by:  As directed    Call MD for:  difficulty breathing, headache or visual disturbances   Complete by:  As directed    Call MD for:  persistant dizziness or light-headedness   Complete by:  As directed    Call MD for:  persistant nausea and vomiting   Complete by:  As directed    Call MD for:  redness, tenderness, or signs of infection (pain, swelling, redness, odor or green/yellow discharge around incision site)   Complete by:  As directed    Call MD for:  severe uncontrolled pain   Complete by:  As directed    Call MD for:  temperature >101 F   Complete by:  As directed    Diet bariatric full liquid   Complete by:  As directed    Incentive spirometry   Complete by:  As directed    Perform hourly while awake      Follow-up Information    Ovidio Kin, MD. Go on 06/10/2018.   Specialty:  General Surgery Why:  @ [address] information: [address] [place] [address] [phone]        Ovidio Kin,  MD .   Specialty:  General Surgery Contact information: [address] [place] [address] [phone]            Signed: Ovidio Kin, M.D., [facility] Office:  [phone]  05/25/2018, 10:04 AM

## 2018-05-25 NOTE — Plan of Care (Signed)

## 2018-05-25 NOTE — Care Management CC44 (Signed)
Condition Code 44 Documentation Completed  Patient Details  Name: Heather EatonCynthia M [name] MRN: [id number] Date of Birth: [birth year]   Condition Code 44 given:   yes Patient signature on Condition Code 44 notice:   yes Documentation of 2 MD's agreement:   yes Code 44 added to claim:   yes    Golda AcreDavis, Rhonda Lynn, RN 05/25/2018, 10:15 AM

## 2018-05-25 NOTE — Care Management Note (Signed)
Case Management Note  Patient Details  Name: Rebecca EatonCynthia M Bhat MRN: [id number] Date of Birth: [birth year]  Subjective/Objective:    Removal of lap band and then sleeve gastectomy                Action/Plan:  Will follow for any cm needs and progress   Expected Discharge Date:                  Expected Discharge Plan:  Home/Self Care  In-House Referral:     Discharge planning Services  CM Consult  Post Acute Care Choice:    Choice offered to:     DME Arranged:    DME Agency:     HH Arranged:    HH Agency:     Status of Service:  In process, will continue to follow  If discussed at Long Length of Stay Meetings, dates discussed:    Additional Comments:  Golda AcreDavis, Rhonda Lynn, RN 05/25/2018, 9:14 AM

## 2018-05-25 NOTE — Care Management CC44 (Signed)
Condition Code 44 Documentation Completed  Patient Details  Name: Heather [name] MRN: [id number] Date of Birth: 08-Dec-1950   Condition Code 44 given:    Patient signature on Condition Code 44 notice:    Documentation of 2 MD's agreement:    Code 44 added to claim:       Golda Acreavis, Rhonda Lynn, RN 05/25/2018, 10:10 AM

## 2018-05-25 NOTE — Progress Notes (Signed)
Patient alert and oriented, pain is controlled. Patient is tolerating fluids, advanced to protein shake today, patient is tolerating well.  Reviewed Gastric sleeve discharge instructions with patient and patient is able to articulate understanding.  Provided information on BELT program, Support Group and [facility] outpatient pharmacy. All questions answered, will continue to monitor.  Total fluid intake 540 Per dehydration protocol call back Friday 8/16

## 2018-05-25 NOTE — Discharge Instructions (Signed)
° ° ° °GASTRIC BYPASS/SLEEVE ° Home Care Instructions ° ° These instructions are to help you care for yourself when you go home. ° °Call: If you have any problems. °• Call [phone] and ask for the surgeon on call °• If you need immediate help, come to the ER at [facility].  °• Tell the ER staff that you are a new post-op gastric bypass or gastric sleeve patient °  °Signs and symptoms to report: • Severe vomiting or nausea °o If you cannot keep down clear liquids for longer than 1 day, call your surgeon  °• Abdominal pain that does not get better after taking your pain medication °• Fever over 100.4° F with chills °• Heart beating over 100 beats a minute °• Shortness of breath at rest °• Chest pain °•  Redness, swelling, drainage, or foul odor at incision (surgical) sites °•  If your incisions open or pull apart °• Swelling or pain in calf (lower leg) °• Diarrhea (Loose bowel movements that happen often), frequent watery, uncontrolled bowel movements °• Constipation, (no bowel movements for 3 days) if this happens: Pick one °o Milk of Magnesia, 2 tablespoons by mouth, 3 times a day for 2 days if needed °o Stop taking Milk of Magnesia once you have a bowel movement °o Call your doctor if constipation continues °Or °o Miralax  (instead of Milk of Magnesia) following the label instructions °o Stop taking Miralax once you have a bowel movement °o Call your doctor if constipation continues °• Anything you think is not normal °  °Normal side effects after surgery: • Unable to sleep at night or unable to focus °• Irritability or moody °• Being tearful (crying) or depressed °These are common complaints, possibly related to your anesthesia medications that put you to sleep, stress of surgery, and change in lifestyle.  This usually goes away a few weeks after surgery.  If these feelings continue, call your primary care doctor. °  °Wound Care: You may have surgical glue, steri-strips, or staples over your incisions after  surgery °• Surgical glue:  Looks like a clear film over your incisions and will wear off a little at a time °• Steri-strips: Strips of tape over your incisions. You may notice a yellowish color on the skin under the steri-strips. This is used to make the   steri-strips stick better. Do not pull the steri-strips off - let them fall off °• Staples: Staples may be removed before you leave the hospital °o If you go home with staples, call [facility], [phone] at for an appointment with your surgeon’s nurse to have staples removed 10 days after surgery. °• Showering: You may shower two (2) days after your surgery unless your surgeon tells you differently °o Wash gently around incisions with warm soapy water, rinse well, and gently pat dry  °o No tub baths until staples are removed, steri-strips fall off or glue is gone.  °  °Medications: • Medications should be liquid or crushed if larger than the size of a dime °• Extended release pills (medication that release a little bit at a time through the day) should NOT be crushed or cut. (examples include XL, ER, DR, SR) °• Depending on the size and number of medications you take, you may need to space (take a few throughout the day)/change the time you take your medications so that you do not over-fill your pouch (smaller stomach) °• Make sure you follow-up with your primary care doctor to   make medication changes needed during rapid weight loss and life-style changes °• If you have diabetes, follow up with the doctor that orders your diabetes medication(s) within one week after surgery and check your blood sugar regularly. °• Do not drive while taking prescription pain medication  °• It is ok to take Tylenol by the bottle instructions with your pain medicine or instead of your pain medicine as needed.  DO NOT TAKE NSAIDS (EXAMPLES OF NSAIDS:  IBUPROFREN/ NAPROXEN)  °Diet:                    First 2 Weeks ° You will see the dietician t about two (2) weeks  after your surgery. The dietician will increase the types of foods you can eat if you are handling liquids well: °• If you have severe vomiting or nausea and cannot keep down clear liquids lasting longer than 1 day, call your surgeon @ ([phone]) °Protein Shake °• Drink at least 2 ounces of shake 5-6 times per day °• Each serving of protein shakes (usually 8 - 12 ounces) should have: °o 15 grams of protein  °o And no more than 5 grams of carbohydrate  °• Goal for protein each day: °o Men = 80 grams per day °o Women = 60 grams per day °• Protein powder may be added to fluids such as non-fat milk or Lactaid milk or unsweetened Soy/Almond milk (limit to 35 grams added protein powder per serving) ° °Hydration °• Slowly increase the amount of water and other clear liquids as tolerated (See Acceptable Fluids) °• Slowly increase the amount of protein shake as tolerated  °•  Sip fluids slowly and throughout the day.  Do not use straws. °• May use sugar substitutes in small amounts (no more than 6 - 8 packets per day; i.e. Splenda) ° °Fluid Goal °• The first goal is to drink at least 8 ounces of protein shake/drink per day (or as directed by the nutritionist); some examples of protein shakes are Syntrax Nectar, Adkins Advantage, EAS Edge HP, and Unjury. See handout from pre-op Bariatric Education Class: °o Slowly increase the amount of protein shake you drink as tolerated °o You may find it easier to slowly sip shakes throughout the day °o It is important to get your proteins in first °• Your fluid goal is to drink 64 - 100 ounces of fluid daily °o It may take a few weeks to build up to this °• 32 oz (or more) should be clear liquids  °And  °• 32 oz (or more) should be full liquids (see below for examples) °• Liquids should not contain sugar, caffeine, or carbonation ° °Clear Liquids: °• Water or Sugar-free flavored water (i.e. Fruit H2O, Propel) °• Decaffeinated coffee or tea (sugar-free) °• Crystal Lite, Wyler’s Lite,  Minute Maid Lite °• Sugar-free Jell-O °• Bouillon or broth °• Sugar-free Popsicle:   *Less than 20 calories each; Limit 1 per day ° °Full Liquids: °Protein Shakes/Drinks + 2 choices per day of other full liquids °• Full liquids must be: °o No More Than 15 grams of Carbs per serving  °o No More Than 3 grams of Fat per serving °• Strained low-fat cream soup (except Cream of Potato or Tomato) °• Non-Fat milk °• Fat-free Lactaid Milk °• Unsweetened Soy Or Unsweetened Almond Milk °• Low Sugar yogurt (Dannon Lite & Fit, Greek yogurt; Oikos Triple Zero; Chobani Simply 100; Yoplait 100 calorie Greek - No Fruit on the Bottom) ° °  °Vitamins   and Minerals  Start 1 day after surgery unless otherwise directed by your surgeon  2 Chewable Bariatric Specific Multivitamin / Multimineral Supplement with iron (Example: Bariatric Advantage Multi EA)  Chewable Calcium with Vitamin D-3 (Example: 3 Chewable Calcium Plus 600 with Vitamin D-3) o Take 500 mg three (3) times a day for a total of 1500 mg each day o Do not take all 3 doses of calcium at one time as it may cause constipation, and you can only absorb 500 mg  at a time  o Do not mix multivitamins containing iron with calcium supplements; take 2 hours apart  Menstruating women and those with a history of anemia (a blood disease that causes weakness) may need extra iron o Talk with your doctor to see if you need more iron  Do not stop taking or change any vitamins or minerals until you talk to your dietitian or surgeon  Your Dietitian and/or surgeon must approve all vitamin and mineral supplements   Activity and Exercise: Limit your physical activity as instructed by your doctor.  It is important to continue walking at home.  During this time, use these guidelines:  Do not lift anything greater than ten (10) pounds for at least two (2) weeks  Do not go back to work or drive until Designer, industrial/productyour surgeon says you can  You may have sex when you feel comfortable  o It is  VERY important for female patients to use a reliable birth control method; fertility often increases after surgery  o All hormonal birth control will be ineffective for 30 days after surgery due to medications given during surgery a barrier method must be used. o Do not get pregnant for at least 18 months  Start exercising as soon as your doctor tells you that you can o Make sure your doctor approves any physical activity  Start with a simple walking program  Walk 5-15 minutes each day, 7 days per week.   Slowly increase until you are walking 30-45 minutes per day Consider joining our BELT program. [phone] or email [email or web address]@[employer] .[email or web address]   Special Instructions Things to remember:  Use your CPAP when sleeping if this applies to you   [facility] has two free Bariatric Surgery Support Groups that meet monthly o The 3rd Thursday of each month, 6 pm, [facility]  o The 2nd Friday of each month, 11:45 am in the private dining room in the basement of [facility]  It is very important to keep all follow up appointments with your surgeon, dietitian, primary care physician, and behavioral health practitioner  Routine follow up schedule with your surgeon include appointments at 2-3 weeks, 6-8 weeks, 6 months, and 1 year at a minimum.  Your surgeon may request to see you more often.   o After the first year, please follow up with your bariatric surgeon and dietitian at least once a year in order to maintain best weight loss results [facility]: [phone] [facility]: [phone] Bariatric Nurse Coordinator: [phone]      Reviewed and Endorsed  by [facility] Patient Education Committee, June, 2016 Edits Approved: Aug, 2018   CENTRAL  SURGERY - DISCHARGE INSTRUCTIONS TO PATIENT  Activity:  Driving - May drive in 4 or 5 days, if doing well and off pain meds.   Lifting - No lifting more  than 15 pounds for 10 days.  Wound Care:   May shower starting tomorrow  Diet:  Post op sleeve diet  Follow up appointment:  Call Dr. Allene PyoNewman's office [facility]) at [phone] for an appointment in 2 to 3 weeks.  Medications and dosages:  Resume your home medications.            You may resume your Eliquis tomorrow, 05/26/2018.  Call Dr. Ezzard StandingNewman or his office  [phone]) if you have:  Temperature greater than 100.4,  Persistent nausea and vomiting,  Severe uncontrolled pain,  Redness, tenderness, or signs of infection (pain, swelling, redness, odor or green/yellow discharge around the site),  Difficulty breathing, headache or visual disturbances,  Any other questions or concerns you may have after discharge.  In an emergency, call 911 or go to an Emergency Department at a nearby hospital.

## 2018-05-31 ENCOUNTER — Telehealth (HOSPITAL_COMMUNITY): Payer: Self-pay

## 2018-05-31 NOTE — Telephone Encounter (Signed)
Patient called to discuss post bariatric surgery follow up questions.  See below:   1.  Tell me about your pain and pain management?has not had prescription made medicine since POD 3 taking tylenol which has been effective  2.  Let's talk about fluid intake.  How much total fluid are you taking in?37-40 ounces of fluid  3.  How much protein have you taken in the last 2 days?30 grams daily, we discussed options to increase protein including skin milk, unflavored protein, bone broth.  Encouraged to increase protein daily  4.  Have you had nausea?  Tell me about when have experienced nausea and what you did to help?no nausea  5.  Has the frequency or color changed with your urine?frequent urination light in color,  Does take diuretic asked patient to schedule follow up with Dr Kriste BasqueNadel who manages medications today  6.  Tell me what your incisions look like?no problems with incisions  7.  Have you been passing gas? BM?passing gas had bm  8.  If a problem or question were to arise who would you call?  Do you know contact numbers for [facility], [facility], and [facility]?aware of how to contact all services  9.  How has the walking going?walking frequently (hourly)as directed at discharge  10.  How are your vitamins and calcium going?  How are you taking them?taking vitamin and calcium without difficulty

## 2018-06-08 ENCOUNTER — Encounter: Payer: Medicare Other | Attending: Surgery | Admitting: Skilled Nursing Facility1

## 2018-06-08 DIAGNOSIS — Z713 Dietary counseling and surveillance: Secondary | ICD-10-CM | POA: Insufficient documentation

## 2018-06-08 DIAGNOSIS — E669 Obesity, unspecified: Secondary | ICD-10-CM

## 2018-06-10 ENCOUNTER — Encounter: Payer: Self-pay | Admitting: Pulmonary Disease

## 2018-06-10 ENCOUNTER — Ambulatory Visit (INDEPENDENT_AMBULATORY_CARE_PROVIDER_SITE_OTHER): Payer: Medicare Other | Admitting: Pulmonary Disease

## 2018-06-10 VITALS — BP 126/64 | HR 58 | Temp 98.0°F | Ht 65.0 in | Wt 292.6 lb

## 2018-06-10 DIAGNOSIS — I10 Essential (primary) hypertension: Secondary | ICD-10-CM

## 2018-06-10 DIAGNOSIS — I480 Paroxysmal atrial fibrillation: Secondary | ICD-10-CM

## 2018-06-10 DIAGNOSIS — I890 Lymphedema, not elsewhere classified: Secondary | ICD-10-CM

## 2018-06-10 DIAGNOSIS — Z7901 Long term (current) use of anticoagulants: Secondary | ICD-10-CM

## 2018-06-10 DIAGNOSIS — J849 Interstitial pulmonary disease, unspecified: Secondary | ICD-10-CM | POA: Diagnosis not present

## 2018-06-10 DIAGNOSIS — I872 Venous insufficiency (chronic) (peripheral): Secondary | ICD-10-CM

## 2018-06-10 DIAGNOSIS — G4733 Obstructive sleep apnea (adult) (pediatric): Secondary | ICD-10-CM | POA: Diagnosis not present

## 2018-06-10 DIAGNOSIS — I5032 Chronic diastolic (congestive) heart failure: Secondary | ICD-10-CM

## 2018-06-10 DIAGNOSIS — Z9884 Bariatric surgery status: Secondary | ICD-10-CM

## 2018-06-10 DIAGNOSIS — I272 Pulmonary hypertension, unspecified: Secondary | ICD-10-CM

## 2018-06-10 DIAGNOSIS — Z6841 Body Mass Index (BMI) 40.0 and over, adult: Secondary | ICD-10-CM

## 2018-06-10 MED ORDER — TRAMADOL HCL 50 MG PO TABS: 50.0000 mg | ORAL_TABLET | Freq: Three times a day (TID) | ORAL | 0 refills | Status: DC | PRN

## 2018-06-10 NOTE — Patient Instructions (Addendum)
Today we updated your med list in our EPIC system...    Continue your current medications the same...  Congrats on completing your surgery & the initial 17# wt loss...  Keep up the good work w/ the diet & exercise program...  Call for any questions...  Let's plan a follow up visit in 72mo, sooner if needed for problems.Marland Kitchen..Marland Kitchen

## 2018-06-10 NOTE — Progress Notes (Addendum)
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 interstitial changes on CXR; Hi-res CT Chest favored COP & we [name] Pred w/ nice response both symptomatically & by XRayOdette Hornsdecr Sed rate to [name] & resolution of the interstial changes;  She is now on Pred 10-5 Qod & Lasix at 80mg38m.. She denies cough, sputum, or change in her DOE    Nocturnal & exercise hypoxemia>  She desats to 86% on RA w/ min walking & signif O2 desats Qhs despite CPAP=> O2 incr to 3L/min bled into machine & f/u ONO confirms good response w/ resolution of nocturnal hypoxemia.    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    Morbid Obesity> Wt=356 lbs in Jun2016 &  she is around 320 lbs today; DrNewmFranki Monteanning to convert her Lap band to Sleeve gastrectomy soon & we hope that substantial wt loss & improvement in OSA will lead to further improvement in her PA pressures...    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    HBP>  On Metop25Bid, CardizenCD120, Losar100, Lasix80 & BP=132/78... She denies CP, palpit, dizzy, syncope, etc; she has VI, marked LE edema despite the Lasix & we will petition for a compression device...    PAF>  On Eliquis5Bid & Multaq400Bid; she has had numerous ablation procedures; now holding NSR so far...    Ven Insuffic/ Edema (lymphedema)>  On Lasix80/d for the last month & the edema is sl diminished;  She knows to follow a strict low salt diet, support hose, elevation; we are trying to get her a pneumatic compression device...Heather KitchenMarland Kitchen  Hypothyroid>  On Synthroid75 now w/ prev TSH 01/2017 = 5.37 on Synthroid50 at the time (therefore dose increased slightly)...    Medical issues> PCP- DrPickard; HBP, hypothyroid, GERD, IC, s/p bilat THR, DJD- knees, etc...  EXAM shows pear-shaped body habitus, Afeb, VSS, O2sat=97% on O2 at 2L/min pulse; Wt=326#, 5'5"Tall, BMI=54;  HEENT- neg, mallampati2;  Chest- decr BS at bases, w/o w/r/r;  Heart- RR gr1/6 SEM w/o r/g;  Abd- obese, neg;  Ext- VI w/ +edema... IMP/PLAN>>  It seems there is no immed rush to decr her Pred to the '5mg'$ /d level required for surg since she is required to participate in a 38moprogram of supervised wt loss w/ nutritional counseling etc via [facility];  Therefore we will continue the '10mg'$  alt w/ '5mg'$  QOD dose for now;  We wrote for TRAMADOL50 + tylenol for her arthritis pain;  She will continue w/ her O2, continue her meds, and work on weight reduction... For her venous insuffic/ lymphedema in LEs- she has been trying no salt, elevation, support hose, & the Lasix, we will try to get her a pneumatic compression device...  ~  September 30, 2017:   247moOV & CyLatorshaeturns for a 60m49moV on Pred 10-5 qod and states she hasn't required any adjustment in dose over the interval; She remains on CPAP Qhs w/ O2 at 3L/min bleed-in and using 2L/min days; all her home pulse-ox checks are in the 90s & heart reate in the 60s; she denies cough/ sput/ hemoptysis/ CP/ etc; her DOE is the same (OK w/ ADLs and getting about, still not able to exercise significantly- using Tramadol+Tylenol vs Tylenol#3 for arthritis pain), and her edema is without change- on low sodium, taking Lasix80, and still awaiting the pneumatic compression device...    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    Nocturnal & exercise hypoxemia> stable on the 3L/min Qhs and 2L/min daytime use...    OSA> [facility] & CPAP download over the last 30d shows excellent compliance and efficacy (see below face to face note)    Obesity> weight today is 321# (down 5#) and we are awaiting bariatric surg...    VI/ lymphedema> on low sodium & Lasix80, we are awaiting approval for pneumatic compression device... EXAM shows pear-shaped body habitus, Afeb, VSS, O2sat=92% on O2 at 2L/min pulse; Wt=321#, 5'5"Tall, BMI=53;  HEENT- neg, mallampati2;  Chest- decr BS at bases, w/o w/r/r;  Heart- RR gr1/6 SEM w/o r/g;  Abd- obese, neg;  Ext- VI w/ +edema...  LABS 09/30/17>  BMet- stable w/ BS=106, BUN=35, Cr=1.22;  ACE=46 (9-67);  Sed=18 (0-30) IMP/PLAN>>  OK to wean Pred to '5mg'$  daily as we discussed;  Continue same CPAP & face-to-face note below;  Continue diet, nutritional counseling from [facility]- moving toward bariatric surg date in 2019;  Still awaiting pneumatic compression device to aide in edema management so we can cut down on her Lasix dose later...  09/30/17 FACE-to-FACE note for CPAP>> Pt [facility] 60mo10mo[facility] download from 10/30 - 09/09/17 shows excellent  compliance (using it 30/30 days for ave 8 hrs per night), and excellent efficacy (on CPAP 11 w/ AHI<1 and no signif leaks)...  ~  December 29, 2017:  3 month ROV & pulmonary/ medical follow up visit>  Heather [name] to maneuver thru  the [facility]-B[facility]ric program requirements for surg, getting monthly nutrition visits that continue thru ~June then surg can be scheduled;  She finally got the lymphedema leg wrap pneumatic compression device & has been pumping for 1H three times daily- she feels that it is already helping & she is [name]... We reviewed these interval Epic notes>    She saw SURG- DrNewman 11/06/17>  She has lost ~50# from her peak wt of 365# in 2016; on Pred 10-5Qod for her lung condition, she has a lap-band (placed 2009) & they are planning to convert to a sleeve gastrectomy...     She had f/u w/ CAR[facility]- NP for DrFitzgerald in [place]>  HBP, AFib, OSA, PulmHTN; s/p mult ablation procedures, on Eliquis5Bid, Multaq400Bid, CardizemCD120, Metop25Bid, Losar100, Lasix40-2Qam; no change in meds...     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    Hx cryptogenic organizing pneumonia>  She presented w/ incr dyspnea 4/18 & had some interstitial changes on CXR; Hi-res CT Chest favored COP (but ACE was 76- r/o Sarcoid) & we [name] Pred w/ nice response both symptomatically & by Heather Horns w/ decr Sed rate to [name] & resolution of the interstial changes;  She is now on Pred 10-5 Qod & Lasix at 42mQam... She denies cough, sputum, or change in her DOE    Nocturnal & exercise hypoxemia>  She desats to 86% on RA w/ min  walking & signif O2 desats Qhs despite CPAP=> O2 incr to 3L/min bled into machine & f/u ONO confirms good response w/ resolution of nocturnal hypoxemia.    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    Morbid Obesity> Wt=365 lbs in Jun2016 & she is around 320 lbs today; DrFranki Montes planning to convert her Lap band to Sleeve gastrectomy soon & we hope that substantial wt loss & improvement in OSA will lead to further improvement in her PA pressures...    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    HBP>  On Metop25Bid, CardizenCD120, Losar100, Lasix80 & BP=134/72... She denies CP, palpit, dizzy, syncope, etc; she has VI, marked LE edema despite the Lasix & we will petition for a compression device=> finally approved & she is using the device for 1H Tid & feels that it is helping...     PAF>  On Eliquis5Bid & Multaq400Bid; she has had numerous ablation procedures; now holding NSR so far...    Ven Insuffic/ Edema (lymphedema)>  On Lasix80/d for the last month & the edema is sl diminished;  She knows to follow a strict low salt diet, support hose, elevation; we are trying to get her a pneumatic compression  device...    Hypothyroid>  On Synthroid75 now w/ prev TSH 01/2017 = 5.37 on Synthroid50 at the time (therefore dose increased slightly)...    Medical issues> PCP- DrPickard; HBP, hypothyroid, GERD, IC, s/p bilat THR, DJD- knees, etc...  EXAM shows pear-shaped body habitus, Afeb, VSS, O2sat=92% on O2 at 2L/min pulse; Wt=327#,  5'5"Tall, BMI=53;  HEENT- neg, mallampati2;  Chest- decr BS at bases, w/o w/r/r;  Heart- RR gr1/6 SEM w/o r/g;  Abd- obese, neg;  Ext- VI w/ +edema...  CXR 12/29/17>  Cardiomeg & tortuous Ao, lungs are felt to be clear w/o focal abn, lap band seen in LUQ...  CPAP download 2/17-3/18/19>  Excellent compliance (used 30/30 days, 8-9H per night), CPAP 11 cmH2O, AHI=<1, min leak...  2DEcho done 01/06/18>  Norm LVF w/ EF=60-65%, no regional wall motion abn, could not eval LV diastolic function, AoV sclerosis & triv regurg, AscAo is 73m, MV mildly thickened w/ mild regurg, mild RV dil, RA is severely dil, mod TR, elev PAsys=723mg (which is sl worse than 4/18 value of 6556m)... IMP/PLAN>>  Unfortunately the est PA pressure is increased over the last yr- she is using the CPAP regularly, using her oxygen regularly, taking her meds faithfully, etc; hopefully the sleeve gastrectomy planned for ?June will result in the needed wt reduction w/ subseq improvement in her PulmHTN;  We decided to wean the Pred down to 5mg56mm, anxiously awaiting the bariatric surg planned for June...   ~  March 29, 2018:  55mo 48mo& CynthJamarseen by her PCP recently c/o URI w/ cough, chest congestion, & incr SOB;  CXR showed a new RML opacity c/w pneumonia & she was treated w/ Doxy Bid;  She reports [name] & approaching baseline- breathing better, DOE [name], no more f/c/s;  We discussed rechecking f/u CXR today...  CynthPerlennoyed at the slow pace of the [facility]- Bariatric program & currently awaiting final requirements to sched the surg w/ DrNewman=> they indicated that she would need labs including TSH & A1C and we will draw these today to expedite,,, She remains stable on her CPAP; she has continued to use her pneumatic leg pump Tid for 1hr treatments; she has lost another 17# down to 310# today... 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    Hx cryptogenic organizing pneumonia>  She presented w/ incr dyspnea 4/18 & had some interstitial changes on CXR; Hi-res CT Chest favored COP (but ACE was 76- r/o Sarcoid, we decided on no Bx) & we [name] Pred w/ nice response both symptomatically & by XRay w/ decr sed rate to [name] & resolution of the interstial changes;  She is now on Pred5Qd & Lasix 80mgQ89m. She denies cough, sputum, or change in her DOE    Nocturnal & exercise hypoxemia>  She desats to 86% on RA w/ min walking & signif O2 desats Qhs despite CPAP=> O2 incr to 3L/min bled into machine & f/u ONO confirms good response w/ resolution of nocturnal hypoxemia;  Awaiting Bariatric surg (Sleeve gastrectomy) and we will retest pt in the post-op period....    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    OSA>  Initial sleep study by DrPickard & CPAP prescribed & followed by him; CPAP download from 2016 showed AHI=1.5 on CPAP 11;  Similar results on CPAP download 4/18 & subseq...    Morbid Obesity> Wt=365 lbs in Jun2016 & she is around 310 lbs today; DrFranki Montes planning to convert her Lap band to Sleeve gastrectomy soon & we hope that substantial wt loss & improvement in OSA will lead to further improvement in her PA pressures...    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    HBP>  On Metop25Bid, CardizemCD120,  Losar100, Lasix80 & BP=126/70... She denies CP, palpit, dizzy, syncope, etc; she has VI, marked LE edema despite the Lasix & we will petition for a compression device=> finally approved & she is using the device for 1H Tid & feels that it is helping...     PAF>  On Eliquis5Bid & Multaq400Bid; she has had numerous ablation procedures; now holding NSR so far...    Ven Insuffic/ Edema (lymphedema)>  On Lasix80/d for the last month & the edema is sl diminished;  She knows to follow a strict low salt diet, support hose, elevation; we obtained a pneumatic compression device for her use & she's been diligent about doing it 3x/d for 1H sessions and she reports that it is really helping decr the edema...    Hypothyroid>  On Synthroid75 now w/ TSH= 2.64 today;  prev TSH 01/2017 = 5.37 on Synthroid50 at that time and dose incr to 7537md...    Medical issues> PCP- DrPickard; HBP, hypothyroid, GERD, IC, s/p bilat THR, DJD- knees, etc...  EXAM shows pear-shaped body habitus, Afeb, VSS, O2sat=97% on O2 at 2L/min pulse; Wt=310#, 5'5"Tall, BMI=51;  HEENT- neg, mallampati2;  Chest- decr BS at bases, w/o w/r/r;  Heart- RR gr1/6 SEM w/o r/g;  Abd- obese, neg;  Ext- VI w/ +edema...  CXR 03/29/18>  (independently reviewed by me in the PACS system) shows heart size stable, lungs well aerated & clear- no focal opacity or effusion, prev RML infiltrate has resolved, NAD...   LABS 03/29/18>  Chems- ok w/ BS=110, Cr=1.11, A1c=6.0;  CBC- ok w/ Hg=12.9, WBC=9.2;  TSH=2.64 IMP/PLAN>>  CynCharlen ready for her Sleeve gastrectomy & we will refer chart to DrNewman's attn- btw her TSH=2.64 and her A1c=6.0... Heather KitchenMarland [name] is excited to get this done & we will check her after a few wks post op...   ~  June 10, 2018:  [place] [facility] & [facility] gast[facility]; everything went well & she is on the post-op diet as outlined by their nutrition staff, she has lost 17# over  the interval since her last OV down to 293# today...  She reports breathing well, just difficult in the hot/ humid days; she has continued on her O2 at 2-3L/min, Pred5mg71m Singulair10, Mucinex600Bid, & Hycodan prn;  CPAP nightly & no issues reported; on Protonix & antireflux regimen as well...  We reviewed her prob list above- doing satis...    EXAM shows pear-shaped body habitus, Afeb, VSS, O2sat=97% on O2 at 2L/min pulse; Wt=293#, 5'5"Tall, BMI=49;  HEENT- neg, mallampati2;  Chest- decr BS at bases, few rales on right, left is clear;  Heart- RR gr1/6 SEM w/o r/g;  Abd- obese, neg;  Ext- VI w/ 1+edema... IMP/PLAN>>  Continue same meds for now; continue diet/ exercise/ & f/u w/ bariatric team; call for any resp problems and we will plan recheck in 37mo    Past Medical History:  Diagnosis Date  . Anemia    "onc;e; had to take iron for awhile"  . Atrial fibrillation ([place]    Began in 2004.  [facility].  She is off coumadin  now with no noted recurrent atrial fibrillation since 2007. til 04/01/12; s/p initiation of Rhythmol with DCCV June 2013; reports 05-18-18 that she is in aflutter   . Carpal tunnel syndrome   . Degenerative disk disease   . GERD (gastroesophageal reflux disease)    resolved after lap band  . History of blood transfusion    "w/both hip replacements"  . HTN (hypertension)   . Hypothyroidism   . IBS (irritable bowel syndrome)   . Interstitial cystitis   . Morton's neuroma    right foot  . Obesity   . On home oxygen therapy    2L continuous daytime; at night uses 3L with CPAP at nighttime   . Pneumonitis dx 1 year ago   cryptogenic organizing pneumonitis; treated with prednisone   . Pulmonary hypertension ([place]   . Shortness of breath    only without oxygen  . Sleep apnea 04/01/12   "dx'd just last week"  . Sleep apnea    uses CPAP, 11 CWP with residual AHI 6.3    Past Surgical History:  Procedure Laterality Date  . ANTERIOR AND  POSTERIOR REPAIR  10/20/2012   Procedure: ANTERIOR (CYSTOCELE) AND POSTERIOR REPAIR (RECTOCELE);  Surgeon: SAlwyn Pea MD;  [facility];  Service: Gynecology;  Laterality: N/A;  2 hours  . ATRIAL FIBRILLATION ABLATION N/A 12/30/2012   Procedure: ATRIAL FIBRILLATION ABLATION;  Surgeon: JThompson Grayer MD;  [facility];  Service: Cardiovascular;  Laterality: N/A;  . BACK SURGERY    . CARDIAC CATHETERIZATION    . CARDIAC ELECTROPHYSIOLOGY MAPPING AND ABLATION  ~ 2005 and 2007   "[facility]  . CARDIOVERSION  04/02/2012   Procedure: CARDIOVERSION;  Surgeon: DLarey Dresser MD;  [place] [facility]  Service: Cardiovascular;  Laterality: N/A;  . CARDIOVERSION  06/16/2012   Procedure: CARDIOVERSION;  Surgeon: PThayer Headings MD;  [facility] [facility]  Service: Cardiovascular;  Laterality: N/A;  amanda/ebp/[place]( or scheduling)  . CARDIOVERSION  10/29/2012   Procedure: CARDIOVERSION;  Surgeon: DLarey Dresser MD;  [facility];  Service: Cardiovascular;  Laterality: N/A;  . CARDIOVERSION N/A 03/30/2013   Procedure: CARDIOVERSION;  Surgeon: DLarey Dresser MD;  [facility] [facility]  Service: Cardiovascular;  Laterality: N/A;  . CARDIOVERSION N/A 12/23/2013   Procedure: CARDIOVERSION;  Surgeon: KDorothy Spark MD;  [place] [facility]  Service: Cardiovascular;  Laterality: N/A;  9:27 Propofol '70mg'$ , IV   150 joules synched shock by Dr. NMeda Coffee@ 150 joules...SR   post 12 lead ordered.   . CARPAL TUNNEL RELEASE  1990's   bilaterally  . DILATION AND CURETTAGE OF UTERUS  2000  . JOINT REPLACEMENT     bilateral hips  . KNEE ARTHROSCOPY Right 11/18/2016   Procedure: RIGHT KNEE ARTHROSCOPY WITH DEBRIDEMENT;  Surgeon: CMcarthur Rossetti MD;  Location:  [place] [facility];  Service: Orthopedics;  Laterality: Right;  . LAPAROSCOPIC GASTRIC BAND REMOVAL WITH LAPAROSCOPIC GASTRIC SLEEVE RESECTION N/A 05/24/2018   Procedure: LAPAROSCOPIC GASTRIC BAND REMOVAL  WITH LAPAROSCOPIC , GASTRIC SLEEVE RESECTION, UPPER ENDO, ERAS Pathway;  Surgeon: Alphonsa Overall, MD;  Location: [facility];  Service: General;  Laterality: N/A;  . LAPAROSCOPIC GASTRIC BANDING  2009  . LASIK    . POSTERIOR FUSION LUMBAR SPINE  2000's   "nerve problems"  . POSTERIOR FUSION LUMBAR SPINE  2000's  . TEE WITHOUT CARDIOVERSION N/A 12/29/2012   Procedure: TRANSESOPHAGEAL ECHOCARDIOGRAM (TEE);  Surgeon: Peter M Martinique, MD;  Location: [place];  Service: Cardiovascular;  Laterality: N/A;  . [place]  . TOTAL HIP ARTHROPLASTY  ~2009; 2010   right; left  . TUBAL LIGATION  1980  . VAGINAL HYSTERECTOMY  2000    Outpatient Encounter Medications as of 06/10/2018  Medication Sig  . albuterol (PROVENTIL HFA;VENTOLIN HFA) 108 (90 Base) MCG/ACT inhaler INHALE 2 PUFFS INTO THE LUNGS EVERY 4 HOURS AS NEEDED FOR WHEEZING/SHORTNESS OF BREATH  . amitriptyline (ELAVIL) 10 MG tablet TAKE 1 TABLET (10 MG TOTAL) BY MOUTH AT BEDTIME.  . Carboxymethylcellul-Glycerin (LUBRICATING EYE DROPS OP) Place 1 drop into both eyes daily as needed (dry eyes).  . cetirizine (ZYRTEC) 10 MG tablet Take 10 mg by mouth at bedtime.   . chlorpheniramine-HYDROcodone (TUSSIONEX PENNKINETIC ER) 10-8 MG/5ML SUER Take 5 mLs by mouth every 12 (twelve) hours as needed for cough.  . Cholecalciferol (VITAMIN D) 2000 UNITS tablet Take 2,000 Units by mouth daily.  . clonazePAM (KLONOPIN) 0.5 MG tablet Take 1/2 to 1 tablet by mouth two times daily (Patient taking differently: Take 0.25-0.5 mg by mouth at bedtime. )  . diclofenac sodium (VOLTAREN) 1 % GEL Apply 2 g topically 4 (four) times daily. (Patient taking differently: Apply 2 g topically 3 (three) times daily as needed (pain). )  . diltiazem (CARDIZEM CD) 120 MG 24 hr capsule Take 123m by mouth daily  . dronedarone (MULTAQ) 400 MG tablet Take 400 mg by mouth 2 (two) times daily with a meal.  . ELIQUIS 5 MG TABS tablet TAKE 1 TABLET BY MOUTH TWICE A  DAY....NEED OFFICE VISIT BEFORE ANY MORE REFILLS  . furosemide (LASIX) 40 MG tablet Take 2 tablets (80 mg total) by mouth every morning.  .Heather KitchenguaiFENesin (MUCINEX) 600 MG 12 hr tablet Take 600 mg by mouth 2 (two) times daily as needed (allergies).   .Heather Kitchenlevothyroxine (SYNTHROID, LEVOTHROID) 75 MCG tablet Take 1 tablet (75 mcg total) by mouth daily before breakfast. (Patient taking differently: Take 75 mcg by mouth at bedtime. )  . losartan (COZAAR) 100 MG tablet TAKE 1 TABLET BY MOUTH  DAILY  . Magnesium 250 MG TABS Take 250 mg by mouth 2 (two) times daily.  . metoprolol tartrate (LOPRESSOR) 25 MG tablet Take 25 mg by mouth 2 (two) times daily.   . montelukast (SINGULAIR) 10 MG tablet Take 1 tablet (10 mg total) by mouth at bedtime.  . pantoprazole (PROTONIX) 40 MG tablet Take 40 mg by mouth daily.  . predniSONE (DELTASONE) 5 MG tablet 567mevery AM (Patient taking differently: Take 5 mg by mouth daily. )  . traMADol (ULTRAM) 50 MG tablet Take 1 tablet (50 mg total) by mouth 3 (three) times daily as needed for moderate pain.  . [DISCONTINUED] traMADol (ULTRAM) 50 MG tablet Take 1 tablet (50 mg total) by mouth 3 (three) times daily. (Patient taking differently: Take 50  mg by mouth 3 (three) times daily as needed for moderate pain. )  . HYDROcodone-homatropine (HYCODAN) 5-1.5 MG/5ML syrup Take 5 mLs by mouth every 6 (six) hours as needed for cough. (Patient not taking: Reported on 06/10/2018)   No facility-administered encounter medications on file as of 06/10/2018.     Allergies  Allergen Reactions  . Cephalexin Anaphylaxis and Hives    "throat [name] closing up"  . Rocephin [Ceftriaxone Sodium In Dextrose] Anaphylaxis  . Penicillins Rash    Has patient had a PCN reaction causing immediate rash, facial/tongue/throat swelling, SOB or lightheadedness with hypotension: no Has patient had a PCN reaction causing severe rash involving mucus membranes or skin necrosis: no Has patient had a PCN reaction  that required hospitalization no Has patient had a PCN reaction occurring within the last 10 years: no If all of the above answers are "NO", then may proceed with Cephalosporin use.  . Flecainide Acetate Other (See Comments)    J[place]y   . Moxifloxacin     Does not remember the reaction  . Sulfonamide Derivatives     Does not remember reaction ; "was so young when I had reaction to it"    Immunization History  Administered Date(s) Administered  . Influenza Split 07/13/2016  . Influenza,inj,Quad PF,6+ Mos 06/30/2017  . Influenza-Unspecified 11/04/2012, 08/24/2014  . Zoster 03/24/2013    Current Medications, Allergies, Past Medical History, Past Surgical History, Family History, and Social History were reviewed in [employer] record.   Review of Systems            All symptoms NEG except where BOLDED >>  Constitutional:  F/C/S, fatigue, anorexia, unexpected weight change. HEENT:  HA, visual changes, hearing loss, earache, nasal symptoms, sore throat, mouth sores, hoarseness. Resp:  cough, sputum, hemoptysis; SOB, tightness, wheezing. Cardio:  CP, palpit, DOE, orthopnea, edema. GI:  N/V/D/C, blood in stool; reflux, abd pain, distention, gas. GU:  dysuria, freq, urgency, hematuria, flank pain, voiding difficulty. MS:  joint pain, swelling, tenderness, decr ROM; neck pain, back pain, etc. Neuro:  HA, tremors, seizures, dizziness, syncope, weakness, numbness, gait abn. Skin:  suspicious lesions or skin rash. Heme:  adenopathy, bruising, bleeding. Psyche:  confusion, agitation, sleep disturbance, hallucinations, anxiety, depression suicidal.   Objective:   Physical Exam      Vital Signs:  Reviewed...   General:  WD, morbidly obese, 67 y/o WF in NAD; alert & oriented; pleasant & cooperative... HEENT:  [place]/AT; Conjunctiva- pink, Sclera- nonicteric, EOM-wnl, PERRLA, EACs-clear, TMs-wnl; NOSE-clear; THROAT-clear & wnl.  Neck:  Supple w/ decr ROM; no JVD; [name]  carotid impulses w/o bruits; no thyromegaly or nodules palpated; no lymphadenopathy.  Chest:  decr BS bilat at bases, few rales at right base, clear on left w/o wheezing of rhonchi. Heart:  Regular Rhythm; gr1/6SEM without rubs or gallops detected. Abdomen:  Obese, soft & nontender- no guarding or rebound; [name] bowel sounds; no organomegaly or masses palpated. Ext:  decrROM; +arthritic changes; no varicose veins, +venous insuffic, lymphedema changes;  Pulses intact w/o bruits. Neuro:  No focal neuro deficits, +gait abnormality & balance fair... Derm:  No lesions noted; no rash etc. Lymph:  No cervical, supraclavicular, axillary, or inguinal adenopathy palpated.   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    Nocturnal &  exercise hypoxemia>  She desats to 86% on RA w/ min walking & signif O2 desats Qhs despite CPAP=> O2 incr to 3L/min bled into machine.    Pulmonary HTN>  2DEcho in [place] by DrFitagerald 5/16 indicated PAsys~80 (in 2014 it was ~34)...    OSA>  Initial sleep study by DrPickard & CPAP prescribed & followed by him; CPAP download from spring 2016 showed AHI=1.5 on CPAP 11...    Morbid Obesity> Wt=356 lbs in Jun2016 & she is down to 311 lbs prior to surg; DrNewman did sleeve gastrectomy on 05/24/18...    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    HBP>  On Metop25Bid, CardizenCD120, Losar100, Lasix80 & BP=128/74...    PAF>  On Eliquis5Bid & Multaq400; she has had numerous ablation procedures; now holding NSR on the amio...    Ven Insuffic/ lymphedema>  On Lasix80 & the low salt diet.. She has a vasc pump for lymphedema & uses it daily...    Hypothyroid>  On Synthroid100 w/ TSH followed by DrPickard...  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       Now she presents w/ a superimposed acute dyspnea w/ CXR abn thought to suggest early CHF & a BNP=250 + 2DEcho w/ good LVF but Gr2DD, placed on Lasix w/ only min improvement;  Note recent Ortho eval w/ very high inflamm markers- treated w/ synvisc;  Further Pulm eval reveals Hi-res CT Chest suggesting COP & collagen vasc screen w/ a few unexplained random marker abn (eg- neg ANA & ANCA but pos RNP, plus the ACE level is sl elev & inflamm markers remain very high (sed=122);  She was placed on empiric trial of PRED pending some of our studies and she's had a good clinical response w/ improvement in her breathing (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  We will have her ret in 58moto assess response & recheck labs. 03/02/17>   CBrunais [name]- breathing better on the Pred & inflamm markers are much better;  This favors the Dx of COP & we will continue to wean the Pred from 229md now to 20-10 qod for 3wks and then 107m til return in 6wks;  She is admonished to get on a vigorous low carb, low fat diet & get her weight down, again I am in favor of Bariatric surg as a life-saving procedure for her- she still has severe secondary pulmHTN on treatment OSA, morbid obesity, restrictive pulm dis. 04/27/17>   We decided to continue the Pred at 20-10 Qod & incr her Lasix from 70m11mto 80mg29m w/ ROV recheck in 1mont60month/18>   Heather [name] to me that she is hoping to get her sleeve gastrectomy surg in Oct/Nov and needs to be on a low dose of Pred ~5mg/d 70m less for this to happen;  She is [name] today w/ the Pred 20-10 qod & Lasix80/d => we decided to cut the Pred to 10mg/d 56mkeep the Lasix  at 80Qam for now w/ rov 44mofor recheck leading up to her bariatric surg. 06/30/17>   We decided to cut the Pred from 169md to 1069mlt w/ 5mg73mD;  Continue Lasix 80mg88m;  She knows to elim all salt/sodium from her diet & we will add pneumatic compression device for her leg edema;  We will continue to check pt monthly prior to her Bariatric surg. 08/11/17>   It seems there is no immed rush to decr her Pred to the 5mg/d14mvel required for surg since she is required to participate in a 27mo pr99mom of supervised wt loss w/ nutritional counseling etc via [facility];  Therefore we will continue the 10mg al56m 5mg QOD 76me for now;  We wrote for TRAMADOL50 + tylenol for her arthritis pain;  She will continue w/ her O2, continue her meds, and work on weight reduction 09/30/17>   OK to wean Pred to 5mg daily30m we discussed;  Continue same CPAP & face-to-face note below;  Continue diet, nutritional counseling from [facility]- moving toward bariatric surg date in 2019;  Still awaiting pneumatic compression device to aide in edema management so we can cut down on her Lasix dose later 12/29/17>   Unfortunately the est PA pressure is increased over the last yr- she is using the CPAP regularly, using her oxygen regularly, taking her meds faithfully, etc; hopefully the sleeve gastrectomy planned for ?June will result in the needed wt reduction w/ subseq improvement in her PulmHTN;  We decided to wean the Pred down to 5mg Qam, a3mously awaiting the bariatric surg planned for June...  03/29/18>  Fawne is Jaquelinneor her Sleeve gastrectomy & we [facility] and her A1c=6.0... She is eMarland KitchenMarland Kitchenited to get this done & we will check her after a few wks post op. 06/10/18>  Trianna comCaren Griffinsher sleeve gastrectomy 2wks ago & lost 17# from her last visit; continue same  meds for now; continue diet/ exercise/ & f/u w/ bariatric team; call for any resp problems and we will plan recheck in 34mo.   Plan75mo  Patient's Medications  New Prescriptions   No medications on file  Previous Medications   ALBUTEROL (PROVENTIL HFA;VENTOLIN HFA) 108 (90 BASE) MCG/ACT INHALER    INHALE 2 PUFFS INTO THE LUNGS EVERY 4 HOURS AS NEEDED FOR WHEEZING/SHORTNESS OF BREATH   AMITRIPTYLINE (ELAVIL) 10 MG TABLET    TAKE 1 TABLET (10 MG TOTAL) BY MOUTH AT BEDTIME.   CARBOXYMETHYLCELLUL-GLYCERIN (LUBRICATING EYE DROPS OP)    Place 1 drop into both eyes daily as needed (dry eyes).   CETIRIZINE (ZYRTEC) 10 MG TABLET    Take 10 mg by mouth at bedtime.    CHLORPHENIRAMINE-HYDROCODONE (TUSSIONEX PENNKINETIC ER) 10-8 MG/5ML SUER    Take 5 mLs by mouth every 12 (twelve) hours as needed for cough.   CHOLECALCIFEROL (VITAMIN D) 2000 UNITS TABLET    Take 2,000 Units by mouth daily.   CLONAZEPAM (KLONOPIN) 0.5 MG TABLET    Take 1/2 to 1 tablet by mouth two times daily   DICLOFENAC SODIUM (VOLTAREN) 1 % GEL    Apply 2 g topically 4 (four) times daily.   DILTIAZEM (CARDIZEM CD) 120 MG 24 HR CAPSULE    Take 120mg by mout15mily   DRONEDARONE (MULTAQ) 400 MG TABLET    Take 400 mg by mouth 2 (two) times daily with a meal.   ELIQUIS 5 MG TABS TABLET  TAKE 1 TABLET BY MOUTH TWICE A DAY....NEED OFFICE VISIT BEFORE ANY MORE REFILLS   FUROSEMIDE (LASIX) 40 MG TABLET    Take 2 tablets (80 mg total) by mouth every morning.   GUAIFENESIN (MUCINEX) 600 MG 12 HR TABLET    Take 600 mg by mouth 2 (two) times daily as needed (allergies).    HYDROCODONE-HOMATROPINE (HYCODAN) 5-1.5 MG/5ML SYRUP    Take 5 mLs by mouth every 6 (six) hours as needed for cough.   LEVOTHYROXINE (SYNTHROID, LEVOTHROID) 75 MCG TABLET    Take 1 tablet (75 mcg total) by mouth daily before breakfast.   LOSARTAN (COZAAR) 100 MG TABLET    TAKE 1 TABLET BY MOUTH  DAILY   MAGNESIUM 250 MG TABS    Take 250 mg by mouth 2 (two) times daily.    METOPROLOL TARTRATE (LOPRESSOR) 25 MG TABLET    Take 25 mg by mouth 2 (two) times daily.    MONTELUKAST (SINGULAIR) 10 MG TABLET    Take 1 tablet (10 mg total) by mouth at bedtime.   PANTOPRAZOLE (PROTONIX) 40 MG TABLET    Take 40 mg by mouth daily.   PREDNISONE (DELTASONE) 5 MG TABLET    58m every AM  Modified Medications   Modified Medication Previous Medication   TRAMADOL (ULTRAM) 50 MG TABLET traMADol (ULTRAM) 50 MG tablet      Take 1 tablet (50 mg total) by mouth 3 (three) times daily as needed for moderate pain.    Take 1 tablet (50 mg total) by mouth 3 (three) times daily.  Discontinued Medications   No medications on file

## 2018-06-11 ENCOUNTER — Other Ambulatory Visit: Payer: Self-pay | Admitting: Pulmonary Disease

## 2018-06-15 ENCOUNTER — Telehealth: Payer: Self-pay | Admitting: Skilled Nursing Facility1

## 2018-06-15 ENCOUNTER — Encounter: Payer: Self-pay | Admitting: Skilled Nursing Facility1

## 2018-06-15 NOTE — Progress Notes (Signed)
Bariatric Class:  Appt start time: 1530 end time:  1630.  2 Week Post-Operative Nutrition Class  Patient was seen on 06/09/2018 for Post-Operative Nutrition education [facility].   Pt states she has had trouble meeting her protein needs: 45-50 grams but getting in 64 fluid ounces.   Surgery date: 05/24/2018 Surgery type: LAGB to Sleeve Start weight at [facility]: 322.5 Weight today: 291  TANITA  BODY COMP RESULTS  06/09/2018   BMI (kg/m^2) 48.4   Fat Mass (lbs) 145   Fat Free Mass (lbs) 146   Total Body Water (lbs) 107.4   The following the learning objectives were met by the patient during this course:  Identifies Phase 3A (Soft, High Proteins) Dietary Goals and will begin from 2 weeks post-operatively to 2 months post-operatively  Identifies appropriate sources of fluids and proteins   States protein recommendations and appropriate sources post-operatively  Identifies the need for appropriate texture modifications, mastication, and bite sizes when consuming solids  Identifies appropriate multivitamin and calcium sources post-operatively  Describes the need for physical activity post-operatively and will follow MD recommendations  States when to call healthcare provider regarding medication questions or post-operative complications  Handouts given during class include:  Phase 3A: Soft, High Protein Diet Handout  Follow-Up Plan: Patient will follow-up at [facility] in 6 weeks for 2 month post-op nutrition visit for diet advancement per MD.

## 2018-06-15 NOTE — Telephone Encounter (Signed)
Dietitian called pt to assess pts diet progression.   Dietitian LVM.

## 2018-06-21 ENCOUNTER — Other Ambulatory Visit: Payer: Self-pay | Admitting: Pulmonary Disease

## 2018-07-15 ENCOUNTER — Other Ambulatory Visit: Payer: Self-pay | Admitting: Family Medicine

## 2018-07-20 ENCOUNTER — Encounter: Payer: Medicare Other | Attending: Surgery | Admitting: Registered"

## 2018-07-20 ENCOUNTER — Encounter: Payer: Self-pay | Admitting: Registered"

## 2018-07-20 DIAGNOSIS — E669 Obesity, unspecified: Secondary | ICD-10-CM

## 2018-07-20 DIAGNOSIS — Z713 Dietary counseling and surveillance: Secondary | ICD-10-CM | POA: Insufficient documentation

## 2018-07-20 NOTE — Patient Instructions (Addendum)
Goals:  Follow Phase IV: High Protein + Non-Starchy Vegetables  Eat 3-6 small meals/snacks, every 3-5 hrs  Increase lean protein foods to meet 60g goal  Increase fluid intake to 64oz +  Avoid drinking 15 minutes before, during and 30 minutes after eating  Aim for >30 min of physical activity daily  Check serving size on multivitamin bottle to know how many to take in a day.   Take 3 calcium supplements a day.

## 2018-07-20 NOTE — Progress Notes (Signed)
Follow-up visit:  8 Weeks Post-Operative LAGB to Sleeve Gastrectomy Surgery  Medical Nutrition Therapy:  Appt start time: 11:45 end time:  12:24.  Primary concerns today: Post-operative Bariatric Surgery Nutrition Management.  Non scale victories: wearing smaller pants sizes, can go grocery shopping without oxygen, feels good  Pt states she has had trouble meeting her protein needs: 45-50 grams but getting in 64 fluid ounces.   Surgery date: 05/24/2018 Surgery type: LAGB to Sleeve Start weight at [facility]: 322.5 Weight today: 280.8 Weight change: 10.2 lbs from 291 (06/09/2018) Total weight lost: 41.7 lbs Weight loss goal:  to improve health, eliminate being on oxygen 24 hrs a day, goal is to use as needed  TANITA  BODY COMP RESULTS  06/09/2018 07/20/2018   BMI (kg/m^2) 48.4 46.7   Fat Mass (lbs) 145 135.6   Fat Free Mass (lbs) 146 145.2   Total Body Water (lbs) 107.4 106.4   Pt states her daughter had bariatric surgery in June and gets frustrated when comparing weights and seeing how her daughter is losing weight faster than she is. Pt states she drinks 60 ounces of water some days and some days only consumes 30 ounces of water; overall drinks at least 50 ounces of fluid daily. Pt states decaf coffee bothers her stomach sometimes. Pt states she has not thought about BELT program but will look into it. Pt states caffeine triggers her interstitial cystitis. Pt reports history of IBS.   Preferred Learning Style:   No preference indicated   Learning Readiness:   Ready  Change in progress  24-hr recall: B (AM): egg + chicken sausage or 1/2 premier protein (15g) Snk (AM): none  L (PM): deli meat (7g) + cottage cheese (14g) or leftovers (chicken or burger) or hot dog  Snk (PM): sometimes 1/2 greek yogurt (6-7g)  D (PM): chicken/burger/fish or salmon patty (21-28g) Snk (PM): none  Fluid intake: water (30 oz), propel, G2, protein2o, green tea; 50-60 ounces Estimated total protein  intake: 60+ grams  Medications: See list Supplementation: 1 Baritatric Advantage capsule + 2 TUMS  Using straws: no Drinking while eating: no Having you been chewing well: yes Chewing/swallowing difficulties: no Changes in vision: no Changes to mood/headaches: no Hair loss/Changes to skin/Changes to nails: no, no, no Any difficulty focusing or concentrating: no Sweating: no Dizziness/Lightheaded: no Palpitations: no  Carbonated beverages: no N/V/D/C/GAS: no, no, no, sometimes, no Abdominal Pain: no Dumping syndrome: no Last Lap-Band fill: N/A  Recent physical activity:  Walking 15 min, 2x/week  Progress Towards Goal(s):  In progress.  Handouts given during visit include:  Phase IV: High Protein + NS vegetables   Nutritional Diagnosis:  [place]-3.3 Overweight/obesity related to past poor dietary habits and physical inactivity as evidenced by patient w/ recent LAGB conversion to sleeve gastrectomy surgery following dietary guidelines for continued weight loss.  Intervention:  Nutrition education and counseling. Pt was educated and counseled on the next phase of the diet and appropriate vitamin and mineral regimen. Pt was in agreement with goals listed.  Goals:  Follow Phase IV: High Protein + Non-Starchy Vegetables  Eat 3-6 small meals/snacks, every 3-5 hrs  Increase lean protein foods to meet 60g goal  Increase fluid intake to 64oz +  Avoid drinking 15 minutes before, during and 30 minutes after eating  Aim for >30 min of physical activity daily  Check serving size on multivitamin bottle to know how many to take in a day.   Take 3 calcium supplements a day.  Teaching Method Utilized:  Visual Auditory Hands on  Barriers to learning/adherence to lifestyle change: none identified  Demonstrated degree of understanding via:  Teach Back   Monitoring/Evaluation:  Dietary intake, exercise, lap band fills, and body weight. Follow up in 4 months for 6 month post-op  visit.

## 2018-08-11 ENCOUNTER — Ambulatory Visit (INDEPENDENT_AMBULATORY_CARE_PROVIDER_SITE_OTHER): Payer: Medicare Other | Admitting: Pulmonary Disease

## 2018-08-11 ENCOUNTER — Encounter: Payer: Self-pay | Admitting: Pulmonary Disease

## 2018-08-11 ENCOUNTER — Ambulatory Visit (INDEPENDENT_AMBULATORY_CARE_PROVIDER_SITE_OTHER): Payer: Medicare Other

## 2018-08-11 VITALS — BP 130/68 | HR 57 | Temp 97.9°F | Ht 65.0 in | Wt 273.4 lb

## 2018-08-11 DIAGNOSIS — Z0000 Encounter for general adult medical examination without abnormal findings: Secondary | ICD-10-CM

## 2018-08-11 DIAGNOSIS — J849 Interstitial pulmonary disease, unspecified: Secondary | ICD-10-CM

## 2018-08-11 DIAGNOSIS — G4733 Obstructive sleep apnea (adult) (pediatric): Secondary | ICD-10-CM | POA: Diagnosis not present

## 2018-08-11 DIAGNOSIS — I272 Pulmonary hypertension, unspecified: Secondary | ICD-10-CM | POA: Diagnosis not present

## 2018-08-11 DIAGNOSIS — Z7901 Long term (current) use of anticoagulants: Secondary | ICD-10-CM

## 2018-08-11 DIAGNOSIS — Z23 Encounter for immunization: Secondary | ICD-10-CM | POA: Diagnosis not present

## 2018-08-11 DIAGNOSIS — Z9884 Bariatric surgery status: Secondary | ICD-10-CM

## 2018-08-11 DIAGNOSIS — I872 Venous insufficiency (chronic) (peripheral): Secondary | ICD-10-CM

## 2018-08-11 DIAGNOSIS — I480 Paroxysmal atrial fibrillation: Secondary | ICD-10-CM

## 2018-08-11 DIAGNOSIS — I890 Lymphedema, not elsewhere classified: Secondary | ICD-10-CM

## 2018-08-11 DIAGNOSIS — I5032 Chronic diastolic (congestive) heart failure: Secondary | ICD-10-CM | POA: Diagnosis not present

## 2018-08-11 NOTE — Patient Instructions (Signed)
Today we updated your med list in our EPIC system...    Continue your current medications the same...  Congrats on the weight loss-- you look great & glad to hear you are feeling better!  We will request [facility] to change your CPAP mode from CPAP-11 to Auto mode 5-15 (w/ ramp feature)    Let me know if this has the desired effect of diminishing the leak & noise...  We will sched a follow up CT Chest to compare to your last scan & determine if we can decrease the Prednisone & wean off...    We will contact you w/ the results when available...    We discussed my up-coming retirement & we will arrange for a follow up visit w/ my partner- DrSood- in about 3 months...  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    Sending you my best wishes for good health & much happiness in the future!!!

## 2018-08-16 ENCOUNTER — Telehealth: Payer: Self-pay | Admitting: Pulmonary Disease

## 2018-08-16 MED ORDER — TRAMADOL HCL 50 MG PO TABS: 50.0000 mg | ORAL_TABLET | Freq: Three times a day (TID) | ORAL | 3 refills | Status: DC | PRN

## 2018-08-16 NOTE — Telephone Encounter (Signed)
Patient is returning phone call.  Phone number is [phone].

## 2018-08-16 NOTE — Telephone Encounter (Signed)
Called patient unable to reach left message to give us a call back.

## 2018-08-16 NOTE — Telephone Encounter (Signed)
Called and spoke with patient, she is requesting a prescription refill of tramadol.   SN please advise, thank you.

## 2018-08-16 NOTE — Telephone Encounter (Signed)
Per SN- Ok for tramadol refill.  Refill called to Patients preferred pharmacy.  Left detailed message for Patient. Nothing further at this time.

## 2018-08-26 ENCOUNTER — Encounter (INDEPENDENT_AMBULATORY_CARE_PROVIDER_SITE_OTHER): Payer: Self-pay

## 2018-08-26 ENCOUNTER — Ambulatory Visit (INDEPENDENT_AMBULATORY_CARE_PROVIDER_SITE_OTHER)
Admission: RE | Admit: 2018-08-26 | Discharge: 2018-08-26 | Disposition: A | Discharge status: Home / Self Care | Payer: Medicare Other | Source: Ambulatory Visit | Attending: Pulmonary Disease | Admitting: Pulmonary Disease

## 2018-08-26 DIAGNOSIS — I272 Pulmonary hypertension, unspecified: Secondary | ICD-10-CM | POA: Diagnosis not present

## 2018-09-02 ENCOUNTER — Other Ambulatory Visit: Payer: Self-pay | Admitting: Pulmonary Disease

## 2018-09-02 DIAGNOSIS — I272 Pulmonary hypertension, unspecified: Secondary | ICD-10-CM

## 2018-09-13 ENCOUNTER — Other Ambulatory Visit (INDEPENDENT_AMBULATORY_CARE_PROVIDER_SITE_OTHER): Payer: Self-pay | Admitting: Physician Assistant

## 2018-09-13 MED ORDER — DICLOFENAC SODIUM 1 % TD GEL: 4.0000 g | Freq: Four times a day (QID) | TRANSDERMAL | 3 refills | Status: DC

## 2018-09-15 ENCOUNTER — Telehealth: Payer: Self-pay | Admitting: Pulmonary Disease

## 2018-09-15 NOTE — Telephone Encounter (Signed)
Called and spoke with Patient.  Patient was concerned that she needed to something, since she hasn't been called.  Cardiology referral for Dr. Shirlee LatchMcLean, placed 09/02/18, and signed by SN.  Explained order has been sent and approved.  Someone should contact her shortly from cardiology to set up appointment with Dr.McLean.  Understanding stated.  Nothing further at this time.

## 2018-09-28 ENCOUNTER — Other Ambulatory Visit: Payer: Self-pay | Admitting: Pulmonary Disease

## 2018-09-28 ENCOUNTER — Other Ambulatory Visit: Payer: Self-pay | Admitting: Family Medicine

## 2018-09-28 ENCOUNTER — Telehealth: Payer: Self-pay | Admitting: Pulmonary Disease

## 2018-09-28 NOTE — Telephone Encounter (Signed)
Called and spoke with Patient.  Asked if she was told by pharmacy she had any refills.  She stated that she did not check her bottle or pharmacy for refills.  Informed her that Tramadol 50mg , was called in to [facility], 08/16/18, #90, with 3 refills.  Patient was going to call pharmacy for refill.  Noting further at this time.

## 2018-11-05 ENCOUNTER — Encounter: Payer: Self-pay | Admitting: Pulmonary Disease

## 2018-11-05 NOTE — Progress Notes (Signed)
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 interstitial changes on CXR; Hi-res CT Chest favored COP & we started Pred w/ nice response both symptomatically & by XRayOdette Hornsdecr Sed rate to normal & resolution of the interstial changes;  She is now on Pred 10-5 Qod & Lasix at 80mg38m.. She denies cough, sputum, or change in her DOE    Nocturnal & exercise hypoxemia>  She desats to 86% on RA w/ min walking & signif O2 desats Qhs despite CPAP=> O2 incr to 3L/min bled into machine & f/u ONO confirms good response w/ resolution of nocturnal hypoxemia.    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    Morbid Obesity> Wt=356 lbs in Jun2016 &  she is around 320 lbs today; DrNewmFranki Monteanning to convert her Lap band to Sleeve [name] soon & we hope that substantial wt loss & improvement in OSA will lead to further improvement in her PA pressures...    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    HBP>  On Metop25Bid, CardizenCD120, Losar100, Lasix80 & BP=132/78... She denies CP, palpit, dizzy, syncope, etc; she has VI, marked LE edema despite the Lasix & we will petition for a compression device...    PAF>  On Eliquis5Bid & Multaq400Bid; she has had numerous ablation procedures; now holding NSR so far...    Ven Insuffic/ Edema (lymphedema)>  On Lasix80/d for the last month & the edema is sl diminished;  She knows to follow a strict low salt diet, support hose, elevation; we are trying to get her a pneumatic compression device...Marland KitchenMarland Kitchen  Hypothyroid>  On Synthroid75 now w/ prev TSH 01/2017 = 5.37 on Synthroid50 at the time (therefore dose increased slightly)...    Medical issues> PCP- DrPickard; HBP, hypothyroid, GERD, IC, s/p bilat THR, DJD- knees, etc...  EXAM shows pear-shaped body habitus, Afeb, VSS, O2sat=97% on O2 at 2L/min pulse; Wt=326#, 5'5"Tall, BMI=54;  HEENT- neg, mallampati2;  Chest- decr BS at bases, w/o w/r/r;  Heart- RR gr1/6 SEM w/o r/g;  Abd- obese, neg;  Ext- VI w/ +edema... IMP/PLAN>>  It seems there is no immed rush to decr her Pred to the '5mg'$ /d level required for surg since she is required to participate in a 33moprogram of supervised wt loss w/ nutritional counseling etc via [facility];  Therefore we will continue the '10mg'$  alt w/ '5mg'$  QOD dose for now;  We wrote for TRAMADOL50 + tylenol for her arthritis pain;  She will continue w/ her O2, continue her meds, and work on weight reduction... For her venous insuffic/ lymphedema in LEs- she has been trying no salt, elevation, support hose, & the Lasix, we will try to get her a pneumatic compression device...  ~  September 30, 2017:   278moOV & CyAzariaheturns for a 31m41moV on Pred 10-5 qod and states she hasn't required any adjustment in dose over the interval; She remains on CPAP Qhs w/ O2 at 3L/min bleed-in and using 2L/min days; all her home pulse-ox checks are in the 90s & heart reate in the 60s; she denies cough/ sput/ hemoptysis/ CP/ etc; her DOE is the same (OK w/ ADLs and getting about, still not able to exercise significantly- using Tramadol+Tylenol vs Tylenol#3 for arthritis pain), and her edema is without change- on low sodium, taking Lasix80, and still awaiting the pneumatic compression device...    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    Nocturnal & exercise hypoxemia> stable on the 3L/min Qhs and 2L/min daytime use...    OSA> [facility] & CPAP download over the last 30d shows excellent compliance and efficacy (see below face to face note)    Obesity> weight today is 321# (down 5#) and we are awaiting bariatric surg...    VI/ lymphedema> on low sodium & Lasix80, we are awaiting approval for pneumatic compression device... EXAM shows pear-shaped body habitus, Afeb, VSS, O2sat=92% on O2 at 2L/min pulse; Wt=321#, 5'5"Tall, BMI=53;  HEENT- neg, mallampati2;  Chest- decr BS at bases, w/o w/r/r;  Heart- RR gr1/6 SEM w/o r/g;  Abd- obese, neg;  Ext- VI w/ +edema...  LABS 09/30/17>  BMet- stable w/ BS=106, BUN=35, Cr=1.22;  ACE=46 (9-67);  Sed=18 (0-30) IMP/PLAN>>  OK to wean Pred to '5mg'$  daily as we discussed;  Continue same CPAP & face-to-face note below;  Continue diet, nutritional counseling from [facility]- moving toward bariatric surg date in 2019;  Still awaiting pneumatic compression device to aide in edema management so we can cut down on her Lasix dose later...  09/30/17 FACE-to-FACE note for CPAP>> [facility] 31mo3[facility] download from 10/30 - 09/09/17 shows excellent  compliance (using it 30/30 days for ave 8 hrs per night), and excellent efficacy (on CPAP 11 w/ AHI<1 and no signif leaks)...  ~  December 29, 2017:  3 month ROV & pulmonary/ medical follow up visit>  Heather [name] to maneuver thru  the [facility]-Bariatric program requirements for surg, getting monthly nutrition visits that continue thru ~June then surg can be scheduled;  She finally got the lymphedema leg wrap pneumatic compression device & has been pumping for 1H three times daily- she feels that it is already helping & she is [name]... We reviewed these interval Epic notes>    She saw SURG- DrNewman 11/06/17>  She has lost ~50# from her peak wt of 365# in 2016; on Pred 10-5Qod for her lung condition, she has a lap-band (placed 2009) & they are planning to convert to a sleeve [name]...     She had f/u w/ CARDS- NP for DrFitzgerald in [place]>  HBP, AFib, OSA, PulmHTN; s/p mult ablation procedures, on Eliquis5Bid, Multaq400Bid, CardizemCD120, Metop25Bid, Losar100, Lasix40-2Qam; no change in meds...     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    Hx cryptogenic organizing pneumonia>  She presented w/ incr dyspnea 4/18 & had some interstitial changes on CXR; Hi-res CT Chest favored COP (but ACE was 76- r/o Sarcoid) & we started Pred w/ nice response both symptomatically & by Odette Horns w/ decr Sed rate to normal & resolution of the interstial changes;  She is now on Pred 10-5 Qod & Lasix at 42mQam... She denies cough, sputum, or change in her DOE    Nocturnal & exercise hypoxemia>  She desats to 86% on RA w/ min  walking & signif O2 desats Qhs despite CPAP=> O2 incr to 3L/min bled into machine & f/u ONO confirms good response w/ resolution of nocturnal hypoxemia.    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    Morbid Obesity> Wt=365 lbs in Jun2016 & she is around 320 lbs today; DrFranki Montes planning to convert her Lap band to Sleeve [name] soon & we hope that substantial wt loss & improvement in OSA will lead to further improvement in her PA pressures...    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    HBP>  On Metop25Bid, CardizenCD120, Losar100, Lasix80 & BP=134/72... She denies CP, palpit, dizzy, syncope, etc; she has VI, marked LE edema despite the Lasix & we will petition for a compression device=> finally approved & she is using the device for 1H Tid & feels that it is helping...     PAF>  On Eliquis5Bid & Multaq400Bid; she has had numerous ablation procedures; now holding NSR so far...    Ven Insuffic/ Edema (lymphedema)>  On Lasix80/d for the last month & the edema is sl diminished;  She knows to follow a strict low salt diet, support hose, elevation; we are trying to get her a pneumatic compression  device...    Hypothyroid>  On Synthroid75 now w/ prev TSH 01/2017 = 5.37 on Synthroid50 at the time (therefore dose increased slightly)...    Medical issues> PCP- DrPickard; HBP, hypothyroid, GERD, IC, s/p bilat THR, DJD- knees, etc...  EXAM shows pear-shaped body habitus, Afeb, VSS, O2sat=92% on O2 at 2L/min pulse; Wt=327#,  5'5"Tall, BMI=53;  HEENT- neg, mallampati2;  Chest- decr BS at bases, w/o w/r/r;  Heart- RR gr1/6 SEM w/o r/g;  Abd- obese, neg;  Ext- VI w/ +edema...  CXR 12/29/17>  Cardiomeg & tortuous Ao, lungs are felt to be clear w/o focal abn, lap band seen in LU[facility]...  CPAP download 2/17-3/18/19>  Excellent compliance (used 30/30 days, 8-9H per night), CPAP 11 cmH2O, AHI=<1, min leak...  2DEcho done 01/06/18>  Norm LVF w/ EF=60-65%, no regional wall motion abn, could not eval LV diastolic function, AoV sclerosis & triv regurg, AscAo is 73m, MV mildly thickened w/ mild regurg, mild RV dil, RA is severely dil, mod TR, elev PAsys=723mg (which is sl worse than 4/18 value of 6556m)... IMP/PLAN>>  Unfortunately the est PA pressure is increased over the last yr- she is using the CPAP regularly, using her oxygen regularly, taking her meds faithfully, etc; hopefully the sleeve [name] planned for ?June will result in the needed wt reduction w/ subseq improvement in her PulmHTN;  We decided to wean the Pred down to 5mg56mm, anxiously awaiting the bariatric surg planned for June...   ~  March 29, 2018:  55mo 48mo& CynthJamarseen by her PCP recently c/o URI w/ cough, chest congestion, & incr SOB;  CXR showed a new RML opacity c/w pneumonia & she was treated w/ Doxy Bid;  She reports [name] & approaching baseline- breathing better, DOE [name], no more f/c/s;  We discussed rechecking f/u CXR today...  CynthPerlennoyed at the slow pace of the [facility]- Bariatric program & currently awaiting final requirements to sched the surg w/ DrNewman=> they indicated that she would need labs including TSH & A1C and we will draw these today to expedite,,, She remains stable on her CPAP; she has continued to use her pneumatic leg pump Tid for 1hr treatments; she has lost another 17# down to 310# today... 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    Hx cryptogenic organizing pneumonia>  She presented w/ incr dyspnea 4/18 & had some interstitial changes on CXR; Hi-res CT Chest favored COP (but ACE was 76- r/o Sarcoid, we decided on no Bx) & we started Pred w/ nice response both symptomatically & by XRay w/ decr sed rate to normal & resolution of the interstial changes;  She is now on Pred5Qd & Lasix 80mgQ89m. She denies cough, sputum, or change in her DOE    Nocturnal & exercise hypoxemia>  She desats to 86% on RA w/ min walking & signif O2 desats Qhs despite CPAP=> O2 incr to 3L/min bled into machine & f/u ONO confirms good response w/ resolution of nocturnal hypoxemia;  Awaiting Bariatric surg (Sleeve [name]) and we will retest pt in the post-op period....    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    OSA>  Initial sleep study by DrPickard & CPAP prescribed & followed by him; CPAP download from 2016 showed AHI=1.5 on CPAP 11;  Similar results on CPAP download 4/18 & subseq...    Morbid Obesity> Wt=365 lbs in Jun2016 & she is around 310 lbs today; DrFranki Montes planning to convert her Lap band to Sleeve [name] soon & we hope that substantial wt loss & improvement in OSA will lead to further improvement in her PA pressures...    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    HBP>  On Metop25Bid, CardizemCD120,  Losar100, Lasix80 & BP=126/70... She denies CP, palpit, dizzy, syncope, etc; she has VI, marked LE edema despite the Lasix & we will petition for a compression device=> finally approved & she is using the device for 1H Tid & feels that it is helping...     PAF>  On Eliquis5Bid & Multaq400Bid; she has had numerous ablation procedures; now holding NSR so far...    Ven Insuffic/ Edema (lymphedema)>  On Lasix80/d for the last month & the edema is sl diminished;  She knows to follow a strict low salt diet, support hose, elevation; we obtained a pneumatic compression device for her use & she's been diligent about doing it 3x/d for 1H sessions and she reports that it is really helping decr the edema...    Hypothyroid>  On Synthroid75 now w/ TSH= 2.64 today;  prev TSH 01/2017 = 5.37 on Synthroid50 at that time and dose incr to 757md...    Medical issues> PCP- DrPickard; HBP, hypothyroid, GERD, IC, s/p bilat THR, DJD- knees, etc...  EXAM shows pear-shaped body habitus, Afeb, VSS, O2sat=97% on O2 at 2L/min pulse; Wt=310#, 5'5"Tall, BMI=51;  HEENT- neg, mallampati2;  Chest- decr BS at bases, w/o w/r/r;  Heart- RR gr1/6 SEM w/o r/g;  Abd- obese, neg;  Ext- VI w/ +edema...  CXR 03/29/18>  (independently reviewed by me in the PACS system) shows heart size stable, lungs well aerated & clear- no focal opacity or effusion, prev RML infiltrate has resolved, NAD...   LABS 03/29/18>  Chems- ok w/ BS=110, Cr=1.11, A1c=6.0;  CBC- ok w/ Hg=12.9, WBC=9.2;  TSH=2.64 IMP/PLAN>>  CynRamyah ready for her Sleeve [name] & we will refer chart to DrNewman's attn- btw her TSH=2.64 and her A1c=6.0... Marland KitchenMarland Kitchene is excited to get this done & we will check her after a few wks post op...  ~  June 10, 2018:  [place] [facility] & sleeve [name]; everything went well & she is on the post-op diet as outlined by their nutrition staff, she has lost 17# over  the interval since her last OV down to 293# today...  She reports breathing well, just difficult in the hot/ humid days; she has continued on her O2 at 2-3L/min, Pred'5mg'$ Qd, Singulair10, Mucinex600Bid, & Hycodan prn;  CPAP nightly & no issues reported; on Protonix & antireflux regimen as well...  We reviewed her prob list above- doing satis...    EXAM shows pear-shaped body habitus, Afeb, VSS, O2sat=97% on O2 at 2L/min pulse; Wt=293#, 5'5"Tall, BMI=49;  HEENT-  neg, mallampati2;  Chest- decr BS at bases, few rales on right, left is clear;  Heart- RR gr1/6 SEM w/o r/g;  Abd- obese, neg;  Ext- VI w/ 1+edema... IMP/PLAN>>  Continue same meds for now; continue diet/ exercise/ & f/u w/ bariatric team; call for any resp problems and we will plan recheck in 72mo   ~  August 11, 2018:  290moOV &pulm follow up visit> CySaniyaheturns and is feeling "wonderful", 100% she says, & breathing better- shopping w/o her oxygen and does fine by her hx; denies cough/ sput/ hemoptysis, DOE [name], no CP/ palpit, and edema is diminished- still using her LE compression device Bid... she is compliant w/ her CPAP but notes that the pressure seems too high now- we discussed changing her mode instead of CPAP 11 we will switch her to Autoset 5-15 and see how she does w/ CPAP download on return... her weight is down another 20# to 273# today s/p bariatric surg she notes appetite is diminished and she is following diet- eating less than before, [facility] and the [facility]eticians...  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    Hx cryptogenic organizing pneumonia>  She presented w/ incr dyspnea 4/18 & had some interstitial  changes on CXR; Hi-res CT Chest favored COP (but ACE was 76- r/o Sarcoid, we decided on no Bx) & we started Pred w/ nice response both symptomatically & by XRay w/ decr sed rate to normal & resolution of the interstial changes;  She is now on Pred5Qd & Lasix '80mg'$ Qam... She denies cough, sputum, or change in her DOE    Nocturnal & exercise hypoxemia>  She desats to 86% on RA w/ min walking & signif O2 desats Qhs despite CPAP=> O2 incr to 3L/min bled into machine & f/u ONO confirms good response w/ resolution of nocturnal hypoxemia;  Awaiting Bariatric surg (Sleeve [name])=> this was done 05/2018 & she will need recheck of exercise oximetry & ONO once weight loss stabilizes...    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    OSA>  Initial sleep study by DrPickard & CPAP prescribed & followed by him; CPAP download from 2016 showed AHI=1.5 on CPAP 11;  Similar results on CPAP download 4/18 & subseq she c/o the pressure w/ her nasal pillows; we decided to change to Autoset 5-15 & f/u w/ download 10/2018.    Morbid Obesity> Wt=365 lbs in Jun2016 & she lost to around 310 lbs prior to gastric sleeve conversion surg 05/2018 by DrNewman=> we are hoping that substantial wt loss & CPAP therapy will lead to improved PA pressures...    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    HBP>  On CardizemCD120, Losar25Bid, Lasix80 & BP=130/68... She denies CP, palpit, dizzy, syncope, etc; she has VI,  marked LE edema despite the Lasix & we will petition for a compression device=> finally approved & she  is using the device for 1H Tid & feels that it is helping...     PAF>  On Eliquis5Bid & Multaq400Bid; she has had numerous ablation procedures; now holding NSR so far...    Ven Insuffic/ Edema (lymphedema)>  On Lasix80/d & the edema is sl diminished;  She knows to follow a strict low salt diet, support hose, elevation; we obtained a pneumatic compression device for her use & she's been diligent about doing it 2x/d for 1H sessions and she reports that it is really helping decr the edema...    Hypothyroid>  On Synthroid75 now w/ TSH= 2.64 today;  prev TSH 01/2017 = 5.37 on Synthroid50 at that time and dose incr to 40mg/d...    Medical issues> PCP- DrPickard; HBP, hypothyroid, GERD, IC, s/p bilat THR, DJD- knees, etc...  EXAM shows pear-shaped body habitus, Afeb, VSS, O2sat=98% on O2 at 1L/min pulse; Wt=273#, 5'5"Tall, BMI=45;  HEENT- neg, mallampati2;  Chest- decr BS at bases, few rales on right, left is clear;  Heart- RR gr1/6 SEM w/o r/g;  Abd- obese, neg;  Ext- VI w/ 1+edema...  CPAP Download 9/30 - 08/10/18>  Excellent compliance, 8H/night, CPAP11, AHI~1, using nasal pillows and recent air-leak increased-- we discussed switching to Autoset 5-15 & she will f/u w/ drSood in 336mo.  CT Chest w/o contrast 08/27/18>  Borderline cardiomeg, coronary & aortic calcif, dilated pulm truck ~4.2cm; no adenopathy, esoph is ok; diffuse ground glass attenuation bilat most severe in mid & lower lungs favored to represent a background of mild chr interstitial pulm edema, prev LLL nodule has resolved, note: lower lobes contain sl more prominentperibronchovasc consolidative changes suggestive of COP... we decided to continue the Pred at '5mg'$ /d in light of her signif clinical improvement... IMP/PLAN>>  CyMozelas [name] clinically w/ her weight reduction & we are hoping for further improvement down the road, advised to cntinue diet & incr exercise;  We have adjusted her CPAP today due to c/o too much pressure w/ her CPAP11  & he nasal pillpws (incr leak) so we will try Autoset 5-15 & see how she does;  Finally we decided to continue the Pred'5mg'$ /d in light of her f/u CT chest showing persistent bilasilar COP-like ground glass opacities... we discussed my up coming retirement & we will ask her to f/u w/ DrSood in 3 months for ongoing issues of COP, pulm HTN, OSA, Obesity s/p sleeve procedure 05/2018...     Past Medical History:  Diagnosis Date  . Anemia    "onc;e; had to take iron for awhile"  . [place]rial fibrillation ([place]   Began in 2004.  [facility]  She is off coumadin  now with no noted recurrent atrial fibrillation since 2007. til 04/01/12; s/p initiation of Rhythmol with DCCV June 2013; reports 05-18-18 that she is in aflutter   . Carpal tunnel syndrome   . Degenerative disk disease   . GERD (gastroesophageal reflux disease)    resolved after lap band  . History of blood transfusion    "w/both hip replacements"  . HTN (hypertension)   . Hypothyroidism   . IBS (irritable bowel syndrome)   . Interstitial cystitis   . Morton's neuroma    right foot  . Obesity   . On home oxygen therapy    2L continuous daytime; at night uses 3L with CPAP at nighttime   .  Pneumonitis dx 1 year ago   cryptogenic organizing pneumonitis; treated with prednisone   . Pulmonary hypertension ([place])   . Shortness of breath    only without oxygen  . Sleep apnea 04/01/12   "dx'd just last week"  . Sleep apnea    uses CPAP, 11 CWP with residual AHI 6.3    Past Surgical History:  Procedure Laterality Date  . ANTERIOR AND POSTERIOR REPAIR  10/20/2012   Procedure: ANTERIOR (CYSTOCELE) AND POSTERIOR REPAIR (RECTOCELE);  Surgeon: Alwyn Pea, MD;  Location: [place] [facility];  Service: Gynecology;  Laterality: N/A;  2 hours  . ATRIAL FIBRILLATION ABLATION N/A 12/30/2012   Procedure: ATRIAL FIBRILLATION ABLATION;  Surgeon: Thompson Grayer, MD;  Location[facility] LAB;  Service: Cardiovascular;  Laterality: N/A;   . BACK SURGERY    . CARDIAC CATHETERIZATION    . CARDIAC ELECTROPHYSIOLOGY MAPPING AND ABLATION  ~ 2005 and 2007   "did more 2nd time; both at [facility]"  . CARDIOVERSION  04/02/2012   Procedure: CARDIOVERSION;  Surgeon: Larey Dresser, MD;  Location: [place];  Service: Cardiovascular;  Laterality: N/A;  . CARDIOVERSION  06/16/2012   Procedure: CARDIOVERSION;  Surgeon: Thayer Headings, MD;  Location: [place];  Service: Cardiovascular;  Laterality: N/A;  amanda/ebp/[place]( or scheduling)  . CARDIOVERSION  10/29/2012   Procedure: CARDIOVERSION;  Surgeon: Larey Dresser, MD;  Location: [facility] ENDOSCOPY;  Service: Cardiovascular;  Laterality: N/A;  . CARDIOVERSION N/A 03/30/2013   Procedure: CARDIOVERSION;  Surgeon: Larey Dresser, MD;  Location: [place];  Service: Cardiovascular;  Laterality: N/A;  . CARDIOVERSION N/A 12/23/2013   Procedure: CARDIOVERSION;  Surgeon: Dorothy Spark, MD;  Location: [facility];  Service: Cardiovascular;  Laterality: N/A;  9:27 Propofol '70mg'$ , IV   150 joules synched shock by Dr. Meda Coffee @ 150 joules...SR   post 12 lead ordered.   . CARPAL TUNNEL RELEASE  1990's   bilaterally  . DILATION AND CURETTAGE OF UTERUS  2000  . JOINT REPLACEMENT     bilateral hips  . KNEE ARTHROSCOPY Right 11/18/2016   Procedure: RIGHT KNEE ARTHROSCOPY WITH DEBRIDEMENT;  Surgeon: Mcarthur Rossetti, MD;  Location: [facility];  Service: Orthopedics;  Laterality: Right;  . LAPAROSCOPIC GASTRIC BAND REMOVAL WITH LAPAROSCOPIC GASTRIC SLEEVE RESECTION N/A 05/24/2018   Procedure: LAPAROSCOPIC GASTRIC BAND REMOVAL WITH LAPAROSCOPIC , GASTRIC SLEEVE RESECTION, UPPER ENDO, ERAS Pathway;  Surgeon: Alphonsa Overall, MD;  Location: [facility];  Service: General;  Laterality: N/A;  . LAPAROSCOPIC GASTRIC BANDING  2009  . LASIK    . POSTERIOR FUSION LUMBAR SPINE  2000's   "nerve problems"  . POSTERIOR FUSION LUMBAR SPINE  2000's  . TEE WITHOUT CARDIOVERSION N/A 12/29/2012   Procedure:  TRANSESOPHAGEAL ECHOCARDIOGRAM (TEE);  Surgeon: Peter M Martinique, MD;  Location: [facility];  Service: Cardiovascular;  Laterality: N/A;  . [place]  . TOTAL HIP ARTHROPLASTY  ~2009; 2010   right; left  . TUBAL LIGATION  1980  . VAGINAL HYSTERECTOMY  2000    Outpatient Encounter Medications as of 08/11/2018  Medication Sig  . albuterol (PROVENTIL HFA;VENTOLIN HFA) 108 (90 Base) MCG/ACT inhaler INHALE 2 PUFFS INTO THE LUNGS EVERY 4 HOURS AS NEEDED FOR WHEEZING/SHORTNESS OF BREATH  . amitriptyline (ELAVIL) 10 MG tablet TAKE 1 TABLET (10 MG TOTAL) BY MOUTH AT BEDTIME.  . Carboxymethylcellul-Glycerin (LUBRICATING EYE DROPS OP) Place 1 drop into both eyes daily as needed (dry eyes).  . cetirizine (ZYRTEC) 10 MG tablet Take 10 mg  by mouth at bedtime.   . chlorpheniramine-HYDROcodone (TUSSIONEX PENNKINETIC ER) 10-8 MG/5ML SUER Take 5 mLs by mouth every 12 (twelve) hours as needed for cough.  . Cholecalciferol (VITAMIN D) 2000 UNITS tablet Take 2,000 Units by mouth daily.  . clonazePAM (KLONOPIN) 0.5 MG tablet TAKE 1/2 TO 1 TABLET BY MOUTH TWICE A DAY  . diltiazem (CARDIZEM CD) 120 MG 24 hr capsule Take '120mg'$  by mouth daily  . dronedarone (MULTAQ) 400 MG tablet Take 400 mg by mouth 2 (two) times daily with a meal.  . ELIQUIS 5 MG TABS tablet TAKE 1 TABLET BY MOUTH TWICE A DAY....NEED OFFICE VISIT BEFORE ANY MORE REFILLS  . guaiFENesin (MUCINEX) 600 MG 12 hr tablet Take 600 mg by mouth 2 (two) times daily as needed (allergies).   Marland Kitchen HYDROcodone-homatropine (HYCODAN) 5-1.5 MG/5ML syrup Take 5 mLs by mouth every 6 (six) hours as needed for cough.  . levothyroxine (SYNTHROID, LEVOTHROID) 75 MCG tablet Take 1 tablet (75 mcg total) by mouth daily before breakfast.  . losartan (COZAAR) 100 MG tablet TAKE 1 TABLET BY MOUTH  DAILY  . Magnesium 250 MG TABS Take 250 mg by mouth 2 (two) times daily.  . metoprolol tartrate (LOPRESSOR) 25 MG tablet Take 25 mg by mouth 2 (two) times  daily.   . pantoprazole (PROTONIX) 40 MG tablet Take 40 mg by mouth daily.  . [DISCONTINUED] diclofenac sodium (VOLTAREN) 1 % GEL Apply 2 g topically 4 (four) times daily. (Patient taking differently: Apply 2 g topically 3 (three) times daily as needed (pain). )  . [DISCONTINUED] furosemide (LASIX) 40 MG tablet Take 2 tablets (80 mg total) by mouth every morning.  . [DISCONTINUED] montelukast (SINGULAIR) 10 MG tablet TAKE 1 TABLET BY MOUTH EVERYDAY AT BEDTIME  . [DISCONTINUED] predniSONE (DELTASONE) 5 MG tablet '5mg'$  every AM (Patient taking differently: Take 5 mg by mouth daily. )  . [DISCONTINUED] traMADol (ULTRAM) 50 MG tablet Take 1 tablet (50 mg total) by mouth 3 (three) times daily as needed for moderate pain.   No facility-administered encounter medications on file as of 08/11/2018.     Allergies  Allergen Reactions  . Cephalexin Anaphylaxis and Hives    "throat started closing up"  . Rocephin [Ceftriaxone Sodium In Dextrose] Anaphylaxis  . Penicillins Rash    Has patient had a PCN reaction causing immediate rash, facial/tongue/throat swelling, SOB or lightheadedness with hypotension: no Has patient had a PCN reaction causing severe rash involving mucus membranes or skin necrosis: no Has patient had a PCN reaction that required hospitalization no Has patient had a PCN reaction occurring within the last 10 years: no If all of the above answers are "NO", then may proceed with Cephalosporin use.  . Flecainide Acetate Other (See Comments)    [place]   . Moxifloxacin     Does not remember the reaction  . Sulfonamide Derivatives     Does not remember reaction ; "was so young when I had reaction to it"    Immunization History  Administered Date(s) Administered  . Influenza Split 07/13/2016  . Influenza, High Dose Seasonal PF 08/11/2018  . Influenza,inj,Quad PF,6+ Mos 06/30/2017  . Influenza-Unspecified 11/04/2012, 08/24/2014  . Pneumococcal Conjugate-13 08/11/2018  . Zoster  03/24/2013    Current Medications, Allergies, Past Medical History, Past Surgical History, Family History, and Social History were reviewed in [employer] record.   Review of Systems            All symptoms NEG except where BOLDED >>  Constitutional:  F/C/S, fatigue, anorexia, unexpected weight change. HEENT:  HA, visual changes, hearing loss, earache, nasal symptoms, sore throat, mouth sores, hoarseness. Resp:  cough, sputum, hemoptysis; SOB, tightness, wheezing. Cardio:  CP, palpit, DOE, orthopnea, edema. GI:  N/V/D/C, blood in stool; reflux, abd pain, distention, gas. GU:  dysuria, freq, urgency, hematuria, flank pain, voiding difficulty. MS:  joint pain, swelling, tenderness, decr ROM; neck pain, back pain, etc. Neuro:  HA, tremors, seizures, dizziness, syncope, weakness, numbness, gait abn. Skin:  suspicious lesions or skin rash. Heme:  adenopathy, bruising, bleeding. Psyche:  confusion, agitation, sleep disturbance, hallucinations, anxiety, depression suicidal.   Objective:   Physical Exam      Vital Signs:  Reviewed...   General:  WD, morbidly obese, 68 y/o WF in NAD; alert & oriented; pleasant & cooperative... HEENT:  [place]/AT; Conjunctiva- pink, Sclera- nonicteric, EOM-wnl, PERRLA, EACs-clear, TMs-wnl; NOSE-clear; THROAT-clear & wnl.  Neck:  Supple w/ decr ROM; no JVD; normal carotid impulses w/o bruits; no thyromegaly or nodules palpated; no lymphadenopathy.  Chest:  decr BS bilat at bases, few rales at right base, clear on left w/o wheezing of rhonchi. Heart:  Regular Rhythm; gr1/6SEM without rubs or gallops detected. Abdomen:  Obese, soft & nontender- no guarding or rebound; normal bowel sounds; no organomegaly or masses palpated. Ext:  decrROM; +arthritic changes; no varicose veins, +venous insuffic, lymphedema changes;  Pulses intact w/o bruits. Neuro:  No focal neuro deficits, +gait abnormality & balance fair... Derm:  No lesions noted; no rash  etc. Lymph:  No cervical, supraclavicular, axillary, or inguinal adenopathy palpated.   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    Nocturnal & exercise hypoxemia>  She desats to 86% on RA w/ min walking & signif O2 desats Qhs despite CPAP=> O2 incr to 3L/min bled into machine.    Pulmonary HTN>  2DEcho in [place] by DrFitagerald 5/16 indicated PAsys~80 (in 2014 it was ~34)...    OSA>  Initial sleep study by DrPickard & CPAP prescribed & followed by him; CPAP download from spring 2016 showed AHI=1.5 on CPAP 11...    Morbid Obesity> Wt=356 lbs in Jun2016 & she is down to 311 lbs prior to surg; DrNewman did sleeve [name] on 05/24/18...    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    HBP>  On Metop25Bid, CardizenCD120, Losar100, Lasix80 & BP=128/74...    PAF>  On Eliquis5Bid & Multaq400; she has had numerous ablation procedures; now holding NSR on the amio...    Ven Insuffic/ lymphedema>  On Lasix80 & the low salt diet.. She has a vasc pump for lymphedema & uses it daily...    Hypothyroid>  On Synthroid100 w/ TSH followed by DrPickard...  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  Now she presents w/ a superimposed acute dyspnea w/ CXR abn thought to suggest early CHF & a BNP=250 + 2DEcho w/ good LVF but  Gr2DD, placed on Lasix w/ only min improvement;  Note recent Ortho eval w/ very high inflamm markers- treated w/ synvisc;  Further Pulm eval reveals Hi-res CT Chest suggesting COP & collagen vasc screen w/ a few unexplained random marker abn (eg- neg ANA & ANCA but pos RNP, plus the ACE level is sl elev & inflamm markers remain very high (sed=122);  She was placed on empiric trial of PRED pending some of our studies and she's had a good clinical response w/ improvement in her breathing (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  We will have her ret in 30moto assess response & recheck labs. 03/02/17>   CBrigitis [name]- breathing better on the Pred & inflamm markers are much better;  This favors the Dx of COP & we will continue to wean the Pred from '20mg'$ /d now to 20-10 qod for 3wks and then '10mg'$ /d til return in 6wks;  She is admonished to get on a vigorous low carb, low fat diet & get her weight down, again I am in favor of Bariatric surg as a life-saving procedure for her- she still has severe secondary pulmHTN on treatment OSA, morbid obesity, restrictive pulm dis. 04/27/17>   We decided to continue the Pred at 20-10 Qod & incr her Lasix from '40mg'$ /d to '80mg'$  Qam w/ ROV recheck in 14month8/16/18>   CyAhmyahndicates to me that she is hoping to get her sleeve [name] surg in Oct/Nov and needs to be on a low dose of Pred ~'5mg'$ /d or less for this to happen;  She is [name] today w/ the Pred 20-10 qod & Lasix80/d => we decided to cut the Pred to '10mg'$ /d and keep the Lasix at 80Qam for now w/ rov [id number] recheck leading up to her bariatric surg. 06/30/17>   We decided to cut the Pred from '10mg'$ /d to '10mg'$  alt w/ '5mg'$  QOD;  Continue Lasix '80mg'$  Qam;  She knows to elim all salt/sodium from her diet & we will add pneumatic compression device for her leg edema;  We will continue to check pt monthly prior to her Bariatric surg. 08/11/17>   It seems there is no immed rush to decr her Pred to the '5mg'$ /d level required for  surg since she is required to participate in a 47mo33mogram of supervised wt loss w/ nutritional counseling etc via [facility];  Therefore we will continue the '10mg'$  alt w/ '5mg'$  QOD dose for now;  We wrote for TRAMADOL50 + tylenol for her arthritis pain;  She will continue w/ her O2, continue her meds, and work on weight reduction 09/30/17>   OK to wean Pred to '5mg'$  daily as we discussed;  Continue same CPAP & face-to-face note below;  Continue diet, nutritional counseling from [facility]- moving toward bariatric surg date in 2019;  Still awaiting pneumatic compression device to aide in edema management so we can cut down on her Lasix dose later 12/29/17>   Unfortunately the est PA pressure is increased over the last yr- she is using the CPAP regularly, using her oxygen regularly, taking her meds faithfully, etc; hopefully the sleeve [name] planned for ?June will result in the needed wt reduction w/ subseq improvement in her PulmHTN;  We decided to wean the Pred down to '5mg'$  Qam, anxiously awaiting the bariatric surg planned for June...  03/29/18>  Heather [name]  ready for her Sleeve [name] & we will refer chart to DrNewman's attn- btw her TSH=2.64 and her A1c=6.0.Marland KitchenMarland Kitchen She is excited to get this done & we will check her after a few wks post op. 06/10/18>  Heather [name] 2wks ago & lost 17# from her last visit; continue same meds for now; continue diet/ exercise/ & f/u w/ bariatric team; call for any resp problems and we will plan recheck in 58mo 08/11/18>   CAubreahas [name] clinically w/ her weight reduction & we are hoping for further improvement down the road, advised to cntinue diet & incr exercise;  We have adjusted her CPAP today due to c/o too much pressure w/ her CPAP11 & he nasal pillpws (incr leak) so we will try Autoset 5-15 & see how she does;  Finally we decided to continue the Pred'5mg'$ /d in light of her f/u CT chest showing persistent bilasilar COP-like ground glass opacities... we  discussed my up coming retirement & we will ask her to f/u w/ DrSood in 3 months for ongoing issues of COP, pulm HTN, OSA, Obesity s/p sleeve procedure 05/2018   Plan:     Patient's Medications  New Prescriptions   No medications on file  Previous Medications   ALBUTEROL (PROVENTIL HFA;VENTOLIN HFA) 108 (90 BASE) MCG/ACT INHALER    INHALE 2 PUFFS INTO THE LUNGS EVERY 4 HOURS AS NEEDED FOR WHEEZING/SHORTNESS OF BREATH   AMITRIPTYLINE (ELAVIL) 10 MG TABLET    TAKE 1 TABLET (10 MG TOTAL) BY MOUTH AT BEDTIME.   CARBOXYMETHYLCELLUL-GLYCERIN (LUBRICATING EYE DROPS OP)    Place 1 drop into both eyes daily as needed (dry eyes).   CETIRIZINE (ZYRTEC) 10 MG TABLET    Take 10 mg by mouth at bedtime.    CHLORPHENIRAMINE-HYDROCODONE (TUSSIONEX PENNKINETIC ER) 10-8 MG/5ML SUER    Take 5 mLs by mouth every 12 (twelve) hours as needed for cough.   CHOLECALCIFEROL (VITAMIN D) 2000 UNITS TABLET    Take 2,000 Units by mouth daily.   CLONAZEPAM (KLONOPIN) 0.5 MG TABLET    TAKE 1/2 TO 1 TABLET BY MOUTH TWICE A DAY   DILTIAZEM (CARDIZEM CD) 120 MG 24 HR CAPSULE    Take '120mg'$  by mouth daily   DRONEDARONE (MULTAQ) 400 MG TABLET    Take 400 mg by mouth 2 (two) times daily with a meal.   ELIQUIS 5 MG TABS TABLET    TAKE 1 TABLET BY MOUTH TWICE A DAY....NEED OFFICE VISIT BEFORE ANY MORE REFILLS   GUAIFENESIN (MUCINEX) 600 MG 12 HR TABLET    Take 600 mg by mouth 2 (two) times daily as needed (allergies).    HYDROCODONE-HOMATROPINE (HYCODAN) 5-1.5 MG/5ML SYRUP    Take 5 mLs by mouth every 6 (six) hours as needed for cough.   LEVOTHYROXINE (SYNTHROID, LEVOTHROID) 75 MCG TABLET    Take 1 tablet (75 mcg total) by mouth daily before breakfast.   LOSARTAN (COZAAR) 100 MG TABLET    TAKE 1 TABLET BY MOUTH  DAILY   MAGNESIUM 250 MG TABS    Take 250 mg by mouth 2 (two) times daily.   METOPROLOL TARTRATE (LOPRESSOR) 25 MG TABLET    Take 25 mg by mouth 2 (two) times daily.    PANTOPRAZOLE (PROTONIX) 40 MG TABLET    Take 40 mg by  mouth daily.  Modified Medications   Modified Medication Previous Medication   DICLOFENAC SODIUM (VOLTAREN) 1 % GEL diclofenac sodium (VOLTAREN) 1 % GEL      Apply  4 g topically 4 (four) times daily.    Apply 2 g topically 4 (four) times daily.   FUROSEMIDE (LASIX) 40 MG TABLET furosemide (LASIX) 40 MG tablet      TAKE 2 TABLETS BY MOUTH IN THE MORNING    Take 2 tablets (80 mg total) by mouth every morning.   MONTELUKAST (SINGULAIR) 10 MG TABLET montelukast (SINGULAIR) 10 MG tablet      TAKE 1 TABLET BY MOUTH EVERYDAY AT BEDTIME    TAKE 1 TABLET BY MOUTH EVERYDAY AT BEDTIME   PREDNISONE (DELTASONE) 5 MG TABLET predniSONE (DELTASONE) 5 MG tablet      Take 1 tablet (5 mg total) by mouth daily.    '5mg'$  every AM   TRAMADOL (ULTRAM) 50 MG TABLET traMADol (ULTRAM) 50 MG tablet      Take 1 tablet (50 mg total) by mouth 3 (three) times daily as needed for moderate pain.    Take 1 tablet (50 mg total) by mouth 3 (three) times daily as needed for moderate pain.  Discontinued Medications   No medications on file

## 2018-11-15 ENCOUNTER — Encounter: Payer: Self-pay | Admitting: Pulmonary Disease

## 2018-11-15 ENCOUNTER — Ambulatory Visit (INDEPENDENT_AMBULATORY_CARE_PROVIDER_SITE_OTHER): Payer: Medicare Other | Admitting: Pulmonary Disease

## 2018-11-15 VITALS — BP 118/80 | HR 57 | Ht 65.0 in | Wt 246.0 lb

## 2018-11-15 DIAGNOSIS — J849 Interstitial pulmonary disease, unspecified: Secondary | ICD-10-CM

## 2018-11-15 DIAGNOSIS — I272 Pulmonary hypertension, unspecified: Secondary | ICD-10-CM | POA: Diagnosis not present

## 2018-11-15 DIAGNOSIS — G4733 Obstructive sleep apnea (adult) (pediatric): Secondary | ICD-10-CM

## 2018-11-15 MED ORDER — PREDNISONE 5 MG PO TABS: 2.5000 mg | ORAL_TABLET | Freq: Every day | ORAL | Status: DC

## 2018-11-15 NOTE — Progress Notes (Signed)
[facility]  Chief Complaint  Patient presents with  . Follow-up    overall doing well.    Constitutional:  BP 118/80 (BP Location: Left Arm, Cuff Size: Normal)   Pulse (!) 57   Ht 5\' 5"  (1.651 m)   Wt 246 lb (111.6 kg)   SpO2 99%   BMI 40.94 kg/m   Past Medical History:  IBS, Cystitis, Hypothyroidism, HTN, GERD, DJD, Carpal tunnel, A fib, Lymphedema  Brief Summary:  Heather [name] is a 68 y.o. female with cryptogenic organizing pneumonia, nocturnal hypoxemia, pulmonary HTN, OSA, and restrictive lung disease.  She was previously seen by Dr. Kriste Basque.  She has lost about 60 lbs since last June.  She had bariatric surgery last Summer.  She has been on prednisone for about 2 years.  Currently on 5 mg daily.  Getting more anxious from prednisone with weight loss.  Breathing better with weight loss, but still gets winded.  She also will need knee surgery at some point, and this impacts her ability to exercise.  She gets joint stiffness, but no swelling.  Denies skin rash.  Not having fever, sputum, chest pain, difficulty swallowing.  Leg swelling has gotten better with weight loss.  Hasn't had lung sampling, and hasn't seen rheumatology before.  She uses CPAP nightly.  Feels like pressure is running too high.  She isn't sure she still has sleep apnea since she lost weight.  She uses oxygen at home and when asleep with CPAP.  Hasn't been using when she goes out.  Had oxygen desaturation to 81% after walking 1 lap on room air today.   Physical Exam:   Appearance - well kempt   ENMT - clear nasal mucosa, midline nasal  septum, no oral exudates, no LAN, trachea midline  Respiratory - normal chest wall, normal respiratory effort, no accessory muscle use, no wheeze/rales  CV - s1s2 regular rate and rhythm, no murmurs, 2+ peripheral edema, radial pulses symmetric  GI - soft, non tender, no masses  Lymph - no adenopathy noted in neck and  axillary areas  MSK - uses a cane  Ext - no cyanosis, clubbing, or joint inflammation noted  Skin - no rashes, lesions, or ulcers  Neuro - normal strength, oriented x 3  Psych - normal mood and affect   Assessment/Plan:   Interstitial lung disease. - she has serology positive for ACE and RNP - will decrease prednisone to 2.5 mg daily - will reassess status in few weeks - at some point in near future will need to repeat HRCT, PFT, and Serology - might need further assessment with lung tissue sampling at some point - might also need rheumatology assessment  Chronic respiratory failure with hypoxia. - she will need to continue using oxygen with exertion and at night  Obstructive sleep apnea. - will arrange for home sleep study to assess current status of sleep apnea after recent weight loss - continue auto CPAP for now - she is compliant with therapy  WHO group 2 and 3 pulmonary hypertension. - will need to repeat Echo sometime in the near future and then determine is she needs further assessment for pulmonary hypertension   Patient Instructions  Change prednisone to 2.5 mg daily  Will arrange for home sleep study  Follow up in 4 weeks    Coralyn Helling, MD [facility] Pager: [phone] 11/15/2018, 10:35 AM  Flow Sheet     Pulmonary tests:  PFT 12/16/13 >>  FEV1 1.84 (72%), FEV1% 84, DLCO 74% Serology 01/30/17 >> RNP 6.5, ACE 76 postive; SCL 70, ANCA, SSA/SSB, ANA, anti CCP, RF negative CT chest 08/27/18 >> diffuse GGO and interlobular septal thickening most in mid/lower lungs  Sleep tests:  PSG 03/25/12 >> AHI 24.7, SpO2 low 76% Auto CPAP 10/16/18 to 11/14/18 >> used on 26 of 30 nights with average 8 hrs 33 min.  Average AHI 0.6 with median CPAP 9 and 95 th percentile CPAP 12 cm H2O  Cardiac tests:  Echo 01/06/18 >> EF 60 to 65%, mod TR, PAS 76 mmHg  Medications:   Allergies as of 11/15/2018      Reactions   Cephalexin Anaphylaxis, Hives    "throat started closing up"   Rocephin [ceftriaxone Sodium In Dextrose] Anaphylaxis   Penicillins Rash   Has patient had a PCN reaction causing immediate rash, facial/tongue/throat swelling, SOB or lightheadedness with hypotension: no Has patient had a PCN reaction causing severe rash involving mucus membranes or skin necrosis: no Has patient had a PCN reaction that required hospitalization no Has patient had a PCN reaction occurring within the last 10 years: no If all of the above answers are "NO", then may proceed with Cephalosporin use.   Flecainide Acetate Other (See Comments)   Jittery    Moxifloxacin    Does not remember the reaction   Sulfonamide Derivatives    Does not remember reaction ; "was so young when I had reaction to it"      Medication List       Accurate as of November 15, 2018 10:35 AM. Always use your most recent med list.        albuterol 108 (90 Base) MCG/ACT inhaler Commonly known as:  PROVENTIL HFA;VENTOLIN HFA INHALE 2 PUFFS INTO THE LUNGS EVERY 4 HOURS AS NEEDED FOR WHEEZING/SHORTNESS OF BREATH   amitriptyline 10 MG tablet Commonly known as:  ELAVIL TAKE 1 TABLET (10 MG TOTAL) BY MOUTH AT BEDTIME.   cetirizine 10 MG tablet Commonly known as:  ZYRTEC Take 10 mg by mouth at bedtime.   chlorpheniramine-HYDROcodone 10-8 MG/5ML Suer Commonly known as:  TUSSIONEX PENNKINETIC ER Take 5 mLs by mouth every 12 (twelve) hours as needed for cough.   clonazePAM 0.5 MG tablet Commonly known as:  KLONOPIN TAKE 1/2 TO 1 TABLET BY MOUTH TWICE A DAY   diclofenac sodium 1 % Gel Commonly known as:  VOLTAREN Apply 4 g topically 4 (four) times daily.   diltiazem 120 MG 24 hr capsule Commonly known as:  CARDIZEM CD Take 120mg  by mouth daily   dronedarone 400 MG tablet Commonly known as:  MULTAQ Take 400 mg by mouth 2 (two) times daily with a meal.   ELIQUIS 5 MG Tabs tablet Generic drug:  apixaban TAKE 1 TABLET BY MOUTH TWICE A DAY....NEED OFFICE VISIT  BEFORE ANY MORE REFILLS   furosemide 40 MG tablet Commonly known as:  LASIX TAKE 2 TABLETS BY MOUTH IN THE MORNING   guaiFENesin 600 MG 12 hr tablet Commonly known as:  MUCINEX Take 600 mg by mouth 2 (two) times daily as needed (allergies).   HYDROcodone-homatropine 5-1.5 MG/5ML syrup Commonly known as:  HYCODAN Take 5 mLs by mouth every 6 (six) hours as needed for cough.   levothyroxine 75 MCG tablet Commonly known as:  SYNTHROID, LEVOTHROID Take 1 tablet (75 mcg total) by mouth daily before breakfast.   losartan 100 MG tablet Commonly known as:  COZAAR TAKE 1 TABLET BY MOUTH  DAILY  LUBRICATING EYE DROPS OP Place 1 drop into both eyes daily as needed (dry eyes).   Magnesium 250 MG Tabs Take 250 mg by mouth 2 (two) times daily.   metoprolol tartrate 25 MG tablet Commonly known as:  LOPRESSOR Take 25 mg by mouth 2 (two) times daily.   montelukast 10 MG tablet Commonly known as:  SINGULAIR TAKE 1 TABLET BY MOUTH EVERYDAY AT BEDTIME   pantoprazole 40 MG tablet Commonly known as:  PROTONIX Take 40 mg by mouth daily.   predniSONE 5 MG tablet Commonly known as:  DELTASONE Take 0.5 tablets (2.5 mg total) by mouth daily with breakfast.   traMADol 50 MG tablet Commonly known as:  ULTRAM Take 1 tablet (50 mg total) by mouth 3 (three) times daily as needed for moderate pain.   Vitamin D 50 MCG (2000 UT) tablet Take 2,000 Units by mouth daily.       Past Surgical History:  She  has a past surgical history that includes Back surgery; Carpal tunnel release (1990's); LASIK; Cardiac catheterization; Laparoscopic gastric banding (2009); Cardiac electrophysiology mapping and ablation (~ 2005 and 2007); Tonsillectomy and adenoidectomy (1610(1957); Vaginal hysterectomy (2000); Tubal ligation (1980); Dilation and curettage of uterus (2000); Total hip arthroplasty (~2009; 2010); Joint replacement; Posterior fusion lumbar spine (2000's); Posterior fusion lumbar spine (2000's);  Cardioversion (04/02/2012); Cardioversion (06/16/2012); Anterior and posterior repair (10/20/2012); Cardioversion (10/29/2012); TEE without cardioversion (N/A, 12/29/2012); Cardioversion (N/A, 03/30/2013); Cardioversion (N/A, 12/23/2013); atrial fibrillation ablation (N/A, 12/30/2012); Knee arthroscopy (Right, 11/18/2016); and Laparoscopic gastric band removal with laparoscopic gastric sleeve resection (N/A, 05/24/2018).  Family History:  Her family history includes Atrial fibrillation in her mother; Dementia in her father; Diabetes in her brother, father, and sister; Heart attack in an other family member; Heart disease in her maternal grandmother; Hypertension in her father.  Social History:  She  reports that she has never smoked. She has never used smokeless tobacco. She reports that she does not drink alcohol or use drugs.

## 2018-11-15 NOTE — Patient Instructions (Signed)
Change prednisone to 2.5 mg daily  Will arrange for home sleep study  Follow up in 4 weeks

## 2018-11-23 ENCOUNTER — Encounter: Payer: Medicare Other | Attending: Surgery | Admitting: Skilled Nursing Facility1

## 2018-11-23 DIAGNOSIS — E669 Obesity, unspecified: Secondary | ICD-10-CM | POA: Diagnosis not present

## 2018-11-24 ENCOUNTER — Encounter (INDEPENDENT_AMBULATORY_CARE_PROVIDER_SITE_OTHER): Payer: Self-pay | Admitting: Orthopaedic Surgery

## 2018-11-24 ENCOUNTER — Ambulatory Visit (INDEPENDENT_AMBULATORY_CARE_PROVIDER_SITE_OTHER): Payer: Medicare Other | Admitting: Orthopaedic Surgery

## 2018-11-24 ENCOUNTER — Ambulatory Visit (INDEPENDENT_AMBULATORY_CARE_PROVIDER_SITE_OTHER): Payer: Medicare Other

## 2018-11-24 ENCOUNTER — Other Ambulatory Visit (INDEPENDENT_AMBULATORY_CARE_PROVIDER_SITE_OTHER): Payer: Self-pay | Admitting: Orthopaedic Surgery

## 2018-11-24 ENCOUNTER — Encounter: Payer: Self-pay | Admitting: Skilled Nursing Facility1

## 2018-11-24 VITALS — Ht 65.0 in | Wt 235.0 lb

## 2018-11-24 DIAGNOSIS — M1711 Unilateral primary osteoarthritis, right knee: Secondary | ICD-10-CM | POA: Diagnosis not present

## 2018-11-24 DIAGNOSIS — M25561 Pain in right knee: Secondary | ICD-10-CM

## 2018-11-24 DIAGNOSIS — G8929 Other chronic pain: Secondary | ICD-10-CM

## 2018-11-24 MED ORDER — ACETAMINOPHEN-CODEINE #3 300-30 MG PO TABS: 1.0000 | ORAL_TABLET | Freq: Three times a day (TID) | ORAL | 0 refills | Status: DC | PRN

## 2018-11-24 NOTE — Progress Notes (Signed)
Follow-up visit:  Post-Operative 11/24/2018 Surgery  Medical Nutrition Therapy:  Appt start time: 6:00pm end time:  7:00pm  Primary concerns today: Post-operative Bariatric Surgery Nutrition Management 6 Month Post-Op Class  Surgery date: 05/24/2018 Surgery type: LAGB [facility] Sleeve [facility] Weight today:248 Weight change: 32.8  TANITA  BODY COMP RESULTS  11/24/2018   BMI (kg/m^2) 41.3   Fat Mass (lbs) 117.6   Fat Free Mass (lbs) 130.4   Total Body Water (lbs)         94.6     Information Reviewed/ Discussed During Appointment: -Review of composition scale numbers -Fluid requirements (64-100 ounces) -Protein requirements (60-80g) -Strategies for tolerating diet -Advancement of diet to include Starchy vegetables -Barriers to inclusion of new foods -Inclusion of appropriate multivitamin and calcium supplements  -Exercise recommendations   Fluid intake: adequate   Medications: See List Supplementation: appropriate   Using straws: no Drinking while eating: no Having you been chewing well: yes Chewing/swallowing difficulties: no Changes in vision: no Changes to mood/headaches: no Hair loss/Cahnges to skin/Changes to nails: no Any difficulty focusing or concentrating: no Sweating: no Dizziness/Lightheaded: no Palpitations: no  Carbonated beverages: no N/V/D/C/GAS: no Abdominal Pain: no Dumping syndrome: no  Recent physical activity:  ADL's  Progress Towards Goal(s):  In Progress  Handouts given during visit include:  Phase V diet Progression   Goals Sheet  The Benefits of Exercise are endless.....  Support Group Topics  Pt Chosen Goals: Nutrition Goals:  I will eat non-starchy vegetables at least 2 separate times in a day 7 days a week by (specific date) 8 months out_____ Physical Activity Goals: I will (type of movement) ______walk___________________ for (hours/minutes) ____30 min_____, for (how many days a week) __________5  days  Teaching Method Utilized:  Visual Auditory Hands on  Demonstrated degree of understanding via:  Teach Back   Monitoring/Evaluation:  Dietary intake, exercise, and body weight. Follow up in 3 months for 9 month post-op visit.

## 2018-11-24 NOTE — Progress Notes (Signed)
Office Visit Note   Patient: Heather [name]           Date of Birth: 1951/09/16           MRN: [id number] Visit Date: 11/24/2018              Requested by: Susy Frizzle, MD [address] [place], [place] [address] PCP: Susy Frizzle, MD   Assessment & Plan: Visit Diagnoses:  1. Chronic pain of right knee   2. Unilateral primary osteoarthritis, right knee     Plan: She understands that we needed to get her BMI below 40 before proceeding with surgery and mainly due to the fact that the soft tissue envelope around her knee is extensive and this will make surgery difficult for getting good alignment of the knee joint itself.  However I do understand that her knee is subluxed now this point and into her present surgery would be better for her.  She is tried and failed all forms conservative treatment and I feel comfortable with proceeding with a knee replacement in light of her x-ray findings and her clinical exam and the fact that she is lost 120 pounds at this point.  Her pain is daily and is severe and have known her for a long period time to the point that I do feel that we should try to get her through knee replacement surgery totally decrease her pain and improve her quality of life.  We will work on getting scheduled.  Follow-Up Instructions: Return for 2 weeks post-op.   Orders:  Orders Placed This Encounter  Procedures  . XR Knee 1-2 Views Right   No orders of the defined types were placed in this encounter.     Procedures: No procedures performed   Clinical Data: No additional findings.   Subjective: Chief Complaint  Patient presents with  . Right Knee - Pain  The patient is someone that I have known for many years now.  I have actually replaced both of her hips.  She is someone that is been sickly at times.  She is on chronic O2 but now her requirements are less because she is lost so much weight.  She is now down to 235 pounds with a BMI of 39.  She  is actually lost 120 pounds since 2018.  Her right knee has well-documented severe end-stage arthritis.  We have tried for several years to try to temporize things with multiple injections as well as activity modification.  The injections have been with both steroid and hyaluronic acid.  She is now gotten a point where her pain is daily and it is detrimentally affecting her mobility and her quality of life.  She has met the goals of weight loss in order for respiratory hopefully successfully be able to perform knee replacement surgery which she is now highly interested in.  HPI  Review of Systems She currently is not using oxygen today but does have in the car.  She denies any shortness of breath, chest pain, fever, chills, nausea, vomiting  Objective: Vital Signs: Ht '5\' 5"'$  (1.651 m)   Wt 235 lb (106.6 kg)   BMI 39.11 kg/m   Physical Exam She is alert and orient x3 and in no acute distress Ortho Exam Examination of her right knee shows obvious pain with range of motion the knee.  There is grinding of the knee joint itself.  There is varus malalignment and an obvious significant deformity.  Her soft tissue envelope is expansive around the knee but much less than it was before. Specialty Comments:  No specialty comments available.  Imaging: Xr Knee 1-2 Views Right  Result Date: 11/24/2018 2 views of the right knee show end-stage arthritis.  There is actually subluxation of the knee joint itself.  There is complete loss of the compartments in terms of joint space in all 3 compartments.  There are large osteophytes in all 3 compartments.  There is varus malalignment.    PMFS History: Patient Active Problem List   Diagnosis Date Noted  . S/P laparoscopic sleeve gastrectomy 06/10/2018  . Morbid obesity with BMI of 50.0-59.9, adult ([place]) 05/24/2018  . RML pneumonia ([place]) 03/29/2018  . Lymphedema of both lower extremities 08/24/2017  . Venous insufficiency of both lower extremities  04/27/2017  . Edema 04/27/2017  . Interstitial pneumonia ([place]) 03/02/2017  . Chronic pain of right knee 02/23/2017  . Unilateral primary osteoarthritis, right knee 02/23/2017  . Chronic diastolic congestive heart failure ([place]) 01/29/2017  . Status post arthroscopy of right knee 11/25/2016  . Acute medial meniscal tear, right, initial encounter 11/06/2016  . Pulmonary hypertension ([place]) 03/22/2015  . Sleep apnea   . Atypical atrial flutter ([place]) 11/27/2013  . History of laparoscopic adjustable gastric banding, APS, 05/09/2008 12/22/2012  . Fatigue 11/23/2012  . Thyroid disease 09/24/2012  . Degenerative disc disease, lumbar 09/24/2012  . Morbid obesity ([place]) 07/30/2012  . Chronic anticoagulation 04/01/2012  . OSA (obstructive sleep apnea) 04/01/2012  . Atrial fibrillation ([place]) 03/24/2012  . Decreased libido 03/01/2012  . Carpal tunnel syndrome   . Essential hypertension 06/20/2010  . Shortness of breath 12/13/2009   Past Medical History:  Diagnosis Date  . Anemia    "onc;e; had to take iron for awhile"  . Atrial fibrillation ([place])    Began in 2004.  Had ablations at [facility] in 2005 and 2007.  She is off coumadin  now with no noted recurrent atrial fibrillation since 2007. til 04/01/12; s/p initiation of Rhythmol with DCCV June 2013; reports 05-18-18 that she is in aflutter   . Carpal tunnel syndrome   . Degenerative disk disease   . GERD (gastroesophageal reflux disease)    resolved after lap band  . History of blood transfusion    "w/both hip replacements"  . HTN (hypertension)   . Hypothyroidism   . IBS (irritable bowel syndrome)   . Interstitial cystitis   . Morton's neuroma    right foot  . Obesity   . On home oxygen therapy    2L continuous daytime; at night uses 3L with CPAP at nighttime   . Pneumonitis dx 1 year ago   cryptogenic organizing pneumonitis; treated with prednisone   . Pulmonary hypertension ([place])   . Shortness of breath    only without oxygen  .  Sleep apnea 04/01/12   "dx'd just last week"  . Sleep apnea    uses CPAP, 11 CWP with residual AHI 6.3    Family History  Problem Relation Age of Onset  . Hypertension Father   . Diabetes Father   . Dementia Father   . Atrial fibrillation Mother   . Diabetes Sister   . Diabetes Brother   . Heart disease Maternal Grandmother   . Heart attack Other        granfather    Past Surgical History:  Procedure Laterality Date  . ANTERIOR AND POSTERIOR REPAIR  10/20/2012   Procedure: ANTERIOR (CYSTOCELE) AND POSTERIOR REPAIR (  RECTOCELE);  Surgeon: Alwyn Pea, MD;  Location: [facility];  Service: Gynecology;  Laterality: N/A;  2 hours  . ATRIAL FIBRILLATION ABLATION N/A 12/30/2012   Procedure: ATRIAL FIBRILLATION ABLATION;  Surgeon: Thompson Grayer, MD;  Location: [facility] CATH LAB;  Service: Cardiovascular;  Laterality: N/A;  . BACK SURGERY    . CARDIAC CATHETERIZATION    . CARDIAC ELECTROPHYSIOLOGY MAPPING AND ABLATION  ~ 2005 and 2007   "did more 2nd time; both at [facility]"  . CARDIOVERSION  04/02/2012   Procedure: CARDIOVERSION;  Surgeon: Larey Dresser, MD;  Location: [facility];  Service: Cardiovascular;  Laterality: N/A;  . CARDIOVERSION  06/16/2012   Procedure: CARDIOVERSION;  Surgeon: Thayer Headings, MD;  Location: [facility];  Service: Cardiovascular;  Laterality: N/A;  amanda/ebp/[place]( or scheduling)  . CARDIOVERSION  10/29/2012   Procedure: CARDIOVERSION;  Surgeon: Larey Dresser, MD;  Location: [facility] ENDOSCOPY;  Service: Cardiovascular;  Laterality: N/A;  . CARDIOVERSION N/A 03/30/2013   Procedure: CARDIOVERSION;  Surgeon: Larey Dresser, MD;  Location: [facility];  Service: Cardiovascular;  Laterality: N/A;  . CARDIOVERSION N/A 12/23/2013   Procedure: CARDIOVERSION;  Surgeon: Dorothy Spark, MD;  Location: [facility];  Service: Cardiovascular;  Laterality: N/A;  9:27 Propofol 47m, IV   150 joules synched shock by Dr. NMeda Coffee@ 150 joules...SR   post 12 lead ordered.   . CARPAL  TUNNEL RELEASE  1990's   bilaterally  . DILATION AND CURETTAGE OF UTERUS  2000  . JOINT REPLACEMENT     bilateral hips  . KNEE ARTHROSCOPY Right 11/18/2016   Procedure: RIGHT KNEE ARTHROSCOPY WITH DEBRIDEMENT;  Surgeon: CMcarthur Rossetti MD;  [place]  Service: Orthopedics;  Laterality: Right;  . LAPAROSCOPIC GASTRIC BAND REMOVAL WITH LAPAROSCOPIC GASTRIC SLEEVE RESECTION N/A 05/24/2018   Procedure: LAPAROSCOPIC GASTRIC BAND REMOVAL WITH LAPAROSCOPIC , GASTRIC SLEEVE RESECTION, UPPER ENDO, ERAS Pathway;  Surgeon: NAlphonsa Overall MD;  Location: [facility];  Service: General;  Laterality: N/A;  . LAPAROSCOPIC GASTRIC BANDING  2009  . LASIK    . POSTERIOR FUSION LUMBAR SPINE  2000's   "nerve problems"  . POSTERIOR FUSION LUMBAR SPINE  2000's  . TEE WITHOUT CARDIOVERSION N/A 12/29/2012   Procedure: TRANSESOPHAGEAL ECHOCARDIOGRAM (TEE);  Surgeon: Peter M JMartinique MD;  [place]  Service: Cardiovascular;  Laterality: N/A;  . [place] . TOTAL HIP ARTHROPLASTY  ~2009; 2010   right; left  . TUBAL LIGATION  1980  . VAGINAL HYSTERECTOMY  2000   Social History   Occupational History  . Not on file  Tobacco Use  . Smoking status: Never Smoker  . Smokeless tobacco: Never Used  Substance and Sexual Activity  . Alcohol use: No  . Drug use: No  . Sexual activity: Yes    Comment: LAVH

## 2018-11-25 DIAGNOSIS — G4733 Obstructive sleep apnea (adult) (pediatric): Secondary | ICD-10-CM

## 2018-12-03 ENCOUNTER — Other Ambulatory Visit (INDEPENDENT_AMBULATORY_CARE_PROVIDER_SITE_OTHER): Payer: Self-pay | Admitting: Physician Assistant

## 2018-12-09 ENCOUNTER — Telehealth: Payer: Self-pay | Admitting: Pulmonary Disease

## 2018-12-09 NOTE — Telephone Encounter (Signed)
Spoke with the pt and she is requesting her HST results  Please advise, thanks

## 2018-12-10 NOTE — Patient Instructions (Addendum)
Heather [name]  12/10/2018   Your procedure is scheduled on: 12-17-18    Report to [facility] Main  Entrance    Report to Admitting at 7:15 AM    Call this number if you have problems the morning of surgery [phone]    Remember: Do not eat food or drink liquids :After Midnight.    BRUSH YOUR TEETH MORNING OF SURGERY AND RINSE YOUR MOUTH OUT, NO CHEWING GUM CANDY OR MINTS.     Take these medicines the morning of surgery with A SIP OF WATER: Clonazepam (Klonopin), Diltiazem (Cardizem-CD), Levothyroxine (Synthroid), Metoprolol Tartrate (Lopressor), and Montelukast (Singular), Dronedarone                                You may not have any metal on your body including hair pins and              piercings  Do not wear jewelry, make-up, lotions, powders or perfumes, deodorant             Do not wear nail polish.  Do not shave  48 hours prior to surgery.             Do not bring valuables to the hospital. [facility] IS NOT             RESPONSIBLE   FOR VALUABLES.  Contacts, dentures or bridgework may not be worn into surgery.  Leave suitcase in the car. After surgery it may be brought to your room.     Patients discharged the day of surgery will not be allowed to drive home. IF YOU ARE HAVING SURGERY AND GOING HOME THE SAME DAY, YOU MUST HAVE AN ADULT TO DRIVE YOU HOME AND BE WITH YOU FOR 24 HOURS. YOU MAY GO HOME BY TAXI OR UBER OR ORTHERWISE, BUT AN ADULT MUST ACCOMPANY YOU HOME AND STAY WITH YOU FOR 24 HOURS.    Special Instructions: N/A              Please read over the following fact sheets you were given: _____________________________________________________________________             [facility] - Preparing for Surgery Before surgery, you can play an important role.  Because skin is not sterile, your skin needs to be as free of germs as possible.  You can reduce the number of germs on your skin by washing with CHG (chlorahexidine gluconate)  soap before surgery.  CHG is an antiseptic cleaner which kills germs and bonds with the skin to continue killing germs even after washing. Please DO NOT use if you have an allergy to CHG or antibacterial soaps.  If your skin becomes reddened/irritated stop using the CHG and inform your nurse when you arrive at Short Stay. Do not shave (including legs and underarms) for at least 48 hours prior to the first CHG shower.  You may shave your face/neck. Please follow these instructions carefully:  1.  Shower with CHG Soap the night before surgery and the  morning of Surgery.  2.  If you choose to wash your hair, wash your hair first as usual with your  normal  shampoo.  3.  After you shampoo, rinse your hair and body thoroughly to remove the  shampoo.  4.  Use CHG as you would any other liquid soap.  You can apply chg directly  to the skin and wash                       Gently with a scrungie or clean washcloth.  5.  Apply the CHG Soap to your body ONLY FROM THE NECK DOWN.   Do not use on face/ open                           Wound or open sores. Avoid contact with eyes, ears mouth and genitals (private parts).                       Wash face,  Genitals (private parts) with your normal soap.             6.  Wash thoroughly, paying special attention to the area where your surgery  will be performed.  7.  Thoroughly rinse your body with warm water from the neck down.  8.  DO NOT shower/wash with your normal soap after using and rinsing off  the CHG Soap.                9.  Pat yourself dry with a clean towel.            10.  Wear clean pajamas.            11.  Place clean sheets on your bed the night of your first shower and do not  sleep with pets. Day of Surgery : Do not apply any lotions/deodorants the morning of surgery.  Please wear clean clothes to the hospital/surgery center.  FAILURE TO FOLLOW THESE INSTRUCTIONS MAY RESULT IN THE CANCELLATION OF YOUR SURGERY PATIENT  SIGNATURE_________________________________  NURSE SIGNATURE__________________________________  ________________________________________________________________________   Heather [name]  An incentive spirometer is a tool that can help keep your lungs clear and active. This tool measures how well you are filling your lungs with each breath. Taking long deep breaths may help reverse or decrease the chance of developing breathing (pulmonary) problems (especially infection) following:  A long period of time when you are unable to move or be active. BEFORE THE PROCEDURE   If the spirometer includes an indicator to show your best effort, your nurse or respiratory therapist will set it to a desired goal.  If possible, sit up straight or lean slightly forward. Try not to slouch.  Hold the incentive spirometer in an upright position. INSTRUCTIONS FOR USE  1. Sit on the edge of your bed if possible, or sit up as far as you can in bed or on a chair. 2. Hold the incentive spirometer in an upright position. 3. Breathe out normally. 4. Place the mouthpiece in your mouth and seal your lips tightly around it. 5. Breathe in slowly and as deeply as possible, raising the piston or the ball toward the top of the column. 6. Hold your breath for 3-5 seconds or for as long as possible. Allow the piston or ball to fall to the bottom of the column. 7. Remove the mouthpiece from your mouth and breathe out normally. 8. Rest for a few seconds and repeat Steps 1 through 7 at least 10 times every 1-2 hours when you are awake. Take your time and take a few normal breaths between deep breaths. 9. The spirometer may include an indicator to show  your best effort. Use the indicator as a goal to work toward during each repetition. 10. After each set of 10 deep breaths, practice coughing to be sure your lungs are clear. If you have an incision (the cut made at the time of surgery), support your incision when coughing  by placing a pillow or rolled up towels firmly against it. Once you are able to get out of bed, walk around indoors and cough well. You may stop using the incentive spirometer when instructed by your caregiver.  RISKS AND COMPLICATIONS  Take your time so you do not get dizzy or light-headed.  If you are in pain, you may need to take or ask for pain medication before doing incentive spirometry. It is harder to take a deep breath if you are having pain. AFTER USE  Rest and breathe slowly and easily.  It can be helpful to keep track of a log of your progress. Your caregiver can provide you with a simple table to help with this. If you are using the spirometer at home, follow these instructions: [place] IF:   You are having difficultly using the spirometer.  You have trouble using the spirometer as often as instructed.  Your pain medication is not giving enough relief while using the spirometer.  You develop fever of 100.5 F (38.1 C) or higher. SEEK IMMEDIATE MEDICAL CARE IF:   You cough up bloody sputum that had not been present before.  You develop fever of 102 F (38.9 C) or greater.  You develop worsening pain at or near the incision site. MAKE SURE YOU:   Understand these instructions.  Will watch your condition.  Will get help right away if you are not doing well or get worse. Document Released: 02/09/2007 Document Revised: 12/22/2011 Document Reviewed: 04/12/2007 [facility] Patient Information 2014 [place], [place].   ________________________________________________________________________

## 2018-12-10 NOTE — Progress Notes (Addendum)
01-06-18 (Epic) ECHO  08-26-18 (Epic) CT chest w/o Contrast   12-13-18 BMP result routed to Dr. Eliberto Ivory office for review

## 2018-12-13 ENCOUNTER — Telehealth (INDEPENDENT_AMBULATORY_CARE_PROVIDER_SITE_OTHER): Payer: Self-pay | Admitting: Radiology

## 2018-12-13 ENCOUNTER — Other Ambulatory Visit: Payer: Self-pay

## 2018-12-13 ENCOUNTER — Encounter (HOSPITAL_COMMUNITY)
Admission: RE | Admit: 2018-12-13 | Discharge: 2018-12-13 | Disposition: A | Discharge status: Home / Self Care | Payer: Medicare Other | Source: Ambulatory Visit | Attending: Orthopaedic Surgery | Admitting: Orthopaedic Surgery

## 2018-12-13 ENCOUNTER — Encounter (HOSPITAL_COMMUNITY): Payer: Self-pay

## 2018-12-13 DIAGNOSIS — R001 Bradycardia, unspecified: Secondary | ICD-10-CM | POA: Insufficient documentation

## 2018-12-13 DIAGNOSIS — I10 Essential (primary) hypertension: Secondary | ICD-10-CM | POA: Insufficient documentation

## 2018-12-13 DIAGNOSIS — M1711 Unilateral primary osteoarthritis, right knee: Secondary | ICD-10-CM | POA: Insufficient documentation

## 2018-12-13 DIAGNOSIS — Z01818 Encounter for other preprocedural examination: Secondary | ICD-10-CM | POA: Insufficient documentation

## 2018-12-13 DIAGNOSIS — R9431 Abnormal electrocardiogram [ECG] [EKG]: Secondary | ICD-10-CM | POA: Diagnosis not present

## 2018-12-13 LAB — SURGICAL PCR SCREEN
MRSA, PCR: NEGATIVE
Staphylococcus aureus: NEGATIVE

## 2018-12-13 LAB — CBC
HCT: 43.7 % (ref 36.0–46.0)
Hemoglobin: 13.1 g/dL (ref 12.0–15.0)
MCH: 27.9 pg (ref 26.0–34.0)
MCHC: 30 g/dL (ref 30.0–36.0)
MCV: 93.2 fL (ref 80.0–100.0)
NRBC: 0 % (ref 0.0–0.2)
Platelets: 225 10*3/uL (ref 150–400)
RBC: 4.69 MIL/uL (ref 3.87–5.11)
RDW: 15.4 % (ref 11.5–15.5)
WBC: 6.2 10*3/uL (ref 4.0–10.5)

## 2018-12-13 LAB — BASIC METABOLIC PANEL
Anion gap: 9 (ref 5–15)
BUN: 28 mg/dL — ABNORMAL HIGH (ref 8–23)
CO2: 29 mmol/L (ref 22–32)
Calcium: 9.2 mg/dL (ref 8.9–10.3)
Chloride: 102 mmol/L (ref 98–111)
Creatinine, Ser: 0.9 mg/dL (ref 0.44–1.00)
GFR calc non Af Amer: >60 mL/min (ref >60)
Glucose, Bld: 101 mg/dL — ABNORMAL HIGH (ref 70–99)
Potassium: 4.1 mmol/L (ref 3.5–5.1)
Sodium: 140 mmol/L (ref 135–145)

## 2018-12-13 NOTE — Progress Notes (Signed)
PCP: Dr. Lynnea Ferrier  CARDIOLOGIST: Dr. Brooke Pace  INFO IN Epic: 01-06-18 (ECHO), 08-26-18 (CXR)  INFO ON CHART:  BLOOD THINNERS AND LAST DOSES: Eliquis 5mg . Last dose 12/10/18 ____________________________________  PATIENT SYMPTOMS AT TIME OF PREOP:  Hx of HTN, A-fib/fluttter, Pulmonary HTN, CHF, OSA

## 2018-12-13 NOTE — Telephone Encounter (Signed)
Scheryl Marten with [facility] pre surgical testing called and would like to know if cardiac clearance has been obtained for patient due to her cardiac history.  She would like a call back or if you have the clearance, you can fax that to her.  CB [phone] or [phone] Fax [phone]

## 2018-12-14 NOTE — Telephone Encounter (Signed)
lmom for patient to advise that Dr. Craige Cotta is working in the hospital so we would call back once results are in.

## 2018-12-15 DIAGNOSIS — G4733 Obstructive sleep apnea (adult) (pediatric): Secondary | ICD-10-CM | POA: Diagnosis not present

## 2018-12-15 NOTE — Progress Notes (Signed)
Anesthesia Chart Review   Case:  [id number] Date/Time:  12/17/18 0930   Procedure:  RIGHT TOTAL KNEE ARTHROPLASTY (Right Knee)   Anesthesia type:  Spinal   Pre-op diagnosis:  osteoarthritis right knee   Location:  [facility] ROOM 09 / [facility]   Surgeon:  Kathryne Hitch, MD      DISCUSSION: 68 yo never smoker with h/o HTN, A-fib (on Multaq, on Eliquis w/last dose 12/10/18), GERD, pulmonary HTN, OSA w/CPAP, O2 dependent (uses 2L continuous during daytime and 3L along with CPAP at night), PVD, hypothyroidism, s/p gastric sleeve 05/24/18, right knee OA scheduled for above procedure with Dr. Doneen Poisson.   Pt last seen by Cardiologist, Dr. Clydie Braun, 09/27/18.  Stable at this visit.  Pt followed by pulmonologist, Dr. Coralyn Helling for pulmonary HTN, OSA, shortness of breath, use of home O2.  Last seen 11/15/18, stable at this visit.  No complications with anesthesia 05/24/18 with gastric sleeve.  She has since lost 120lbs.  Improvement of sx with weight loss.   Pt can proceed with planned procedure barring acute status change.  VS: BP 127/79 (BP Location: Left Arm)   Pulse (!) 54   Temp 36.7 C (Oral)   Resp 18   Ht 5\' 5"  (1.651 m)   Wt 109.1 kg   SpO2 98%   BMI 40.02 kg/m   PROVIDERS: Donita Brooks, MD is PCP   Clydie Braun, MD is Cardiologist   Coralyn Helling, MD is Pulmonologist  LABS: Labs reviewed: Acceptable for surgery. (all labs ordered are listed, but only abnormal results are displayed)  Labs Reviewed  BASIC METABOLIC PANEL - Abnormal; Notable for the following components:      Result Value   Glucose, Bld 101 (*)    BUN 28 (*)    All other components within normal limits  SURGICAL PCR SCREEN  CBC     IMAGES: CT Chest 08/26/18 IMPRESSION: 1. The appearance of the lungs is again favored to predominantly reflect a background of mild interstitial pulmonary edema in the setting of chronic congestive heart failure. 2. However, there are areas of more  severe peribronchovascular consolidative change in the basal segments of the lower lobes of the lungs bilaterally. These appear slightly increased compared to the prior study. The possibility of a superimposed process such as cryptogenic organizing pneumonia (COP) should be considered. The possibility of acute infection is not excluded, but is not favored given the apparent chronicity of these findings. 3. Mild cardiomegaly. 4. Aortic atherosclerosis, in addition to 2 vessel coronary artery disease. Please note that although the presence of coronary artery calcium documents the presence of coronary artery disease, the severity of this disease and any potential stenosis cannot be assessed on this non-gated CT examination. Assessment for potential risk factor modification, dietary therapy or pharmacologic therapy may be warranted, if clinically indicated. 5. Severe dilatation of the pulmonic trunk (4.2 cm in diameter), concerning for pulmonary arterial hypertension.  EKG: 12/13/2018 Rate 54 bpm Sinus bradycardia with 1st degree AV block with premature atrial complexes T wave abnormality, consider inferior ischemia T wave abnormality, consider anterolateral ischemia  Abnormal ECG   CV: Echo 01/06/18 Study Conclusions  - Left ventricle: The cavity size was normal. Wall thickness was   normal. Systolic function was normal. The estimated ejection   fraction was in the range of 60% to 65%. Wall motion was normal;   there were no regional wall motion abnormalities. The study is   not technically sufficient to allow  evaluation of LV diastolic   function. - Aortic valve: Sclerosis without stenosis. There was trivial   regurgitation. - Aorta: Ascending aortic diameter: 39 mm (S). - Ascending aorta: The ascending aorta was mildly dilated. - Mitral valve: Mildly thickened leaflets . There was mild   regurgitation. - Left atrium: The atrium was normal in size. - Right ventricle: The  cavity size was mildly dilated. Wall   thickness was normal. Systolic function was normal. - Right atrium: Severely dilated. - Tricuspid valve: There was moderate regurgitation. - Pulmonic valve: There was mild regurgitation. - Pulmonary arteries: PA peak pressure: 76 mm Hg (S). - Inferior vena cava: The IVC measures >2.1 cm, but collapses >50%,   suggesting an elevated RA pressure of 8 mmHg.  Impressions:  - Compared to a prior study in 01/2017, the RVSP is higher at 76   mmHg (from 65 mmHg). LVEF is unchanged at 60-65%. Past Medical History:  Diagnosis Date  . Anemia    "onc;e; had to take iron for awhile"  . Atrial fibrillation (HCC)    Began in 2004.  Had ablations at [facility] in 2005 and 2007.  She is off coumadin  now with no noted recurrent atrial fibrillation since 2007. til 04/01/12; s/p initiation of Rhythmol with DCCV June 2013; reports 05-18-18 that she is in aflutter   . Carpal tunnel syndrome   . Degenerative disk disease   . GERD (gastroesophageal reflux disease)    resolved after lap band  . History of blood transfusion    "w/both hip replacements"  . HTN (hypertension)   . Hypothyroidism   . IBS (irritable bowel syndrome)   . Interstitial cystitis   . Morton's neuroma    right foot  . Obesity   . On home oxygen therapy    2L continuous daytime; at night uses 3L with CPAP at nighttime   . Pneumonitis dx 1 year ago   cryptogenic organizing pneumonitis; treated with prednisone   . Pulmonary hypertension (HCC)   . Shortness of breath    only without oxygen  . Sleep apnea 04/01/12   "dx'd just last week"  . Sleep apnea    uses CPAP, 11 CWP with residual AHI 6.3    Past Surgical History:  Procedure Laterality Date  . ANTERIOR AND POSTERIOR REPAIR  10/20/2012   Procedure: ANTERIOR (CYSTOCELE) AND POSTERIOR REPAIR (RECTOCELE);  Surgeon: Esmeralda Arthur, MD;  Location: [facility];  Service: Gynecology;  Laterality: N/A;  2 hours  . ATRIAL FIBRILLATION ABLATION N/A  12/30/2012   Procedure: ATRIAL FIBRILLATION ABLATION;  Surgeon: Hillis Range, MD;  Location: [facility] CATH LAB;  Service: Cardiovascular;  Laterality: N/A;  . BACK SURGERY    . CARDIAC CATHETERIZATION    . CARDIAC ELECTROPHYSIOLOGY MAPPING AND ABLATION  ~ 2005 and 2007   "did more 2nd time; both at [facility]"  . CARDIOVERSION  04/02/2012   Procedure: CARDIOVERSION;  Surgeon: Laurey Morale, MD;  Location[facility];  Service: Cardiovascular;  Laterality: N/A;  . CARDIOVERSION  06/16/2012   Procedure: CARDIOVERSION;  Surgeon: [name], MD;  Location: [facility] ENDOSCOPY;  Service: Cardiovascular;  Laterality: N/A;  amanda/ebp/Beverly( or scheduling)  . CARDIOVERSION  10/29/2012   Procedure: CARDIOVERSION;  Surgeon: Laurey Morale, MD;  Location: [facility] ENDOSCOPY;  Service: Cardiovascular;  Laterality: N/A;  . CARDIOVERSION N/A 03/30/2013   Procedure: CARDIOVERSION;  Surgeon: Laurey Morale, MD;  Location: [facility] ENDOSCOPY;  Service: Cardiovascular;  Laterality: N/A;  . CARDIOVERSION N/A 12/23/2013  Procedure: CARDIOVERSION;  Surgeon: Lars Masson, MD;  Location: [facility] ENDOSCOPY;  Service: Cardiovascular;  Laterality: N/A;  9:27 Propofol , IV   150 joules synched shock by Dr. Delton See @ 150 joules...SR   post 12 lead ordered.   . CARPAL TUNNEL RELEASE  1990's   bilaterally  . DILATION AND CURETTAGE OF UTERUS  2000  . JOINT REPLACEMENT     bilateral hips  . KNEE ARTHROSCOPY Right 11/18/2016   Procedure: RIGHT KNEE ARTHROSCOPY WITH DEBRIDEMENT;  Surgeon: Kathryne Hitch, MD;  Location: [facility] OR;  Service: Orthopedics;  Laterality: Right;  . LAPAROSCOPIC GASTRIC BAND REMOVAL WITH LAPAROSCOPIC GASTRIC SLEEVE RESECTION N/A 05/24/2018   Procedure: LAPAROSCOPIC GASTRIC BAND REMOVAL WITH LAPAROSCOPIC , GASTRIC SLEEVE RESECTION, UPPER ENDO, ERAS Pathway;  Surgeon: Ovidio Kin, MD;  Location: [facility];  Service: General;  Laterality: N/A;  . LAPAROSCOPIC GASTRIC BANDING  2009  . LASIK    . POSTERIOR FUSION  LUMBAR SPINE  2000's   "nerve problems"  . POSTERIOR FUSION LUMBAR SPINE  2000's  . TEE WITHOUT CARDIOVERSION N/A 12/29/2012   Procedure: TRANSESOPHAGEAL ECHOCARDIOGRAM (TEE);  Surgeon: Peter M Swaziland, MD;  Location: [facility] ENDOSCOPY;  Service: Cardiovascular;  Laterality: N/A;  . TONSILLECTOMY AND ADENOIDECTOMY  1957  . TOTAL HIP ARTHROPLASTY  ~2009; 2010   right; left  . TUBAL LIGATION  1980  . VAGINAL HYSTERECTOMY  2000    MEDICATIONS: . acetaminophen-codeine (TYLENOL #3) 300-30 MG tablet  . albuterol (PROVENTIL HFA;VENTOLIN HFA) 108 (90 Base) MCG/ACT inhaler  . amitriptyline (ELAVIL) 10 MG tablet  . Carboxymethylcellul-Glycerin (LUBRICATING EYE DROPS OP)  . cetirizine (ZYRTEC) 10 MG tablet  . chlorpheniramine-HYDROcodone (TUSSIONEX PENNKINETIC ER) 10-8 MG/5ML SUER  . Cholecalciferol (VITAMIN D) 2000 UNITS tablet  . clonazePAM (KLONOPIN) 0.5 MG tablet  . diclofenac sodium (VOLTAREN) 1 % GEL  . diltiazem (CARDIZEM CD) 120 MG 24 hr capsule  . dronedarone (MULTAQ) 400 MG tablet  . ELIQUIS 5 MG TABS tablet  . furosemide (LASIX) 40 MG tablet  . guaiFENesin (MUCINEX) 600 MG 12 hr tablet  . HYDROcodone-homatropine (HYCODAN) 5-1.5 MG/5ML syrup  . levothyroxine (SYNTHROID, LEVOTHROID) 75 MCG tablet  . losartan (COZAAR) 100 MG tablet  . Magnesium 250 MG TABS  . metoprolol tartrate (LOPRESSOR) 25 MG tablet  . montelukast (SINGULAIR) 10 MG tablet  . Multiple Vitamins-Minerals (BARIATRIC MULTIVITAMINS/IRON PO)  . OXYGEN  . predniSONE (DELTASONE) 5 MG tablet  . traMADol (ULTRAM) 50 MG tablet  . vitamin C (ASCORBIC ACID) 500 MG tablet   No current facility-administered medications for this encounter.      Janey Genta [facility]-Surgical Testing [phone] 12/15/18 2:23 PM

## 2018-12-15 NOTE — Telephone Encounter (Signed)
HST 11/25/18 >> AHI 23.7, SpO2 low 80%   Please inform her that her sleep study shows moderate obstructive sleep apnea.  Please arrange for ROV with me or NP to discuss treatment options.

## 2018-12-15 NOTE — Telephone Encounter (Signed)
Awaiting on results to be resulted from VS. Called patient to make her aware unable to reach left message with husband to have her give [place] a call back.

## 2018-12-15 NOTE — Anesthesia Preprocedure Evaluation (Deleted)
Anesthesia Evaluation    Airway        Dental   Pulmonary           Cardiovascular hypertension,      Neuro/Psych    GI/Hepatic   Endo/Other    Renal/GU      Musculoskeletal   Abdominal   Peds  Hematology   Anesthesia Other Findings   Reproductive/Obstetrics                             Anesthesia Physical Anesthesia Plan  ASA:   Anesthesia Plan:    Post-op Pain Management:    Induction:   PONV Risk Score and Plan:   Airway Management Planned:   Additional Equipment:   Intra-op Plan:   Post-operative Plan:   Informed Consent:   Plan Discussed with:   Anesthesia Plan Comments: (See PAT note 12/13/18, Jodell Cipro, PA-C)        Anesthesia Quick Evaluation

## 2018-12-16 NOTE — Telephone Encounter (Signed)
Called spoke with patient to discuss sleep study results/recs as stated by VS below  Patient voiced her understanding and reported that she is due for knee replacement surgery tomorrow but she is scheduled to see VS on 3.25.2020 and asked if this is okay to keep - advised patient that yes, she may discuss these results at that visit.  Nothing further needed at this time, will sign off

## 2018-12-17 ENCOUNTER — Inpatient Hospital Stay (HOSPITAL_COMMUNITY)
Admission: RE | Admit: 2018-12-17 | Discharge: 2018-12-19 | DRG: 470 | Disposition: A | Discharge status: Home Health | Payer: Medicare Other | Attending: Orthopaedic Surgery | Admitting: Orthopaedic Surgery

## 2018-12-17 ENCOUNTER — Encounter (HOSPITAL_COMMUNITY)
Admission: RE | Disposition: A | Discharge status: Home Health | Payer: Self-pay | Source: Home / Self Care | Attending: Orthopaedic Surgery

## 2018-12-17 ENCOUNTER — Inpatient Hospital Stay (HOSPITAL_COMMUNITY): Payer: Medicare Other

## 2018-12-17 ENCOUNTER — Other Ambulatory Visit: Payer: Self-pay

## 2018-12-17 ENCOUNTER — Ambulatory Visit (HOSPITAL_COMMUNITY): Payer: Medicare Other | Admitting: Anesthesiology

## 2018-12-17 ENCOUNTER — Ambulatory Visit (HOSPITAL_COMMUNITY): Payer: Medicare Other | Admitting: Physician Assistant

## 2018-12-17 ENCOUNTER — Encounter (HOSPITAL_COMMUNITY): Payer: Self-pay | Admitting: *Deleted

## 2018-12-17 DIAGNOSIS — I5032 Chronic diastolic (congestive) heart failure: Secondary | ICD-10-CM | POA: Diagnosis present

## 2018-12-17 DIAGNOSIS — Z96643 Presence of artificial hip joint, bilateral: Secondary | ICD-10-CM | POA: Diagnosis present

## 2018-12-17 DIAGNOSIS — Z981 Arthrodesis status: Secondary | ICD-10-CM | POA: Diagnosis not present

## 2018-12-17 DIAGNOSIS — Z881 Allergy status to other antibiotic agents status: Secondary | ICD-10-CM

## 2018-12-17 DIAGNOSIS — I4891 Unspecified atrial fibrillation: Secondary | ICD-10-CM | POA: Diagnosis present

## 2018-12-17 DIAGNOSIS — I110 Hypertensive heart disease with heart failure: Secondary | ICD-10-CM | POA: Diagnosis present

## 2018-12-17 DIAGNOSIS — Z833 Family history of diabetes mellitus: Secondary | ICD-10-CM

## 2018-12-17 DIAGNOSIS — Z9981 Dependence on supplemental oxygen: Secondary | ICD-10-CM

## 2018-12-17 DIAGNOSIS — Z6841 Body Mass Index (BMI) 40.0 and over, adult: Secondary | ICD-10-CM | POA: Diagnosis not present

## 2018-12-17 DIAGNOSIS — M1711 Unilateral primary osteoarthritis, right knee: Secondary | ICD-10-CM | POA: Diagnosis present

## 2018-12-17 DIAGNOSIS — E039 Hypothyroidism, unspecified: Secondary | ICD-10-CM | POA: Diagnosis present

## 2018-12-17 DIAGNOSIS — Z90710 Acquired absence of both cervix and uterus: Secondary | ICD-10-CM

## 2018-12-17 DIAGNOSIS — Z882 Allergy status to sulfonamides status: Secondary | ICD-10-CM | POA: Diagnosis not present

## 2018-12-17 DIAGNOSIS — I272 Pulmonary hypertension, unspecified: Secondary | ICD-10-CM | POA: Diagnosis present

## 2018-12-17 DIAGNOSIS — Z880 Allergy status to penicillin: Secondary | ICD-10-CM

## 2018-12-17 DIAGNOSIS — K589 Irritable bowel syndrome without diarrhea: Secondary | ICD-10-CM | POA: Diagnosis present

## 2018-12-17 DIAGNOSIS — Z8249 Family history of ischemic heart disease and other diseases of the circulatory system: Secondary | ICD-10-CM

## 2018-12-17 DIAGNOSIS — G4733 Obstructive sleep apnea (adult) (pediatric): Secondary | ICD-10-CM | POA: Diagnosis present

## 2018-12-17 DIAGNOSIS — Z96651 Presence of right artificial knee joint: Secondary | ICD-10-CM

## 2018-12-17 HISTORY — PX: TOTAL KNEE ARTHROPLASTY: SHX125

## 2018-12-17 SURGERY — ARTHROPLASTY, KNEE, TOTAL
Anesthesia: Spinal | Site: Knee | Laterality: Right

## 2018-12-17 MED ORDER — ONDANSETRON HCL 4 MG/2ML IJ SOLN: 4.0000 mg | Freq: Four times a day (QID) | INTRAMUSCULAR | Status: DC | PRN

## 2018-12-17 MED ORDER — FUROSEMIDE 40 MG PO TABS
80.0000 mg | ORAL_TABLET | Freq: Every day | ORAL | Status: DC
  Administered 2018-12-18 – 2018-12-19 (×2): 80 mg via ORAL
  Filled 2018-12-17 (×2): qty 2

## 2018-12-17 MED ORDER — MONTELUKAST SODIUM 10 MG PO TABS
10.0000 mg | ORAL_TABLET | Freq: Every day | ORAL | Status: DC
  Administered 2018-12-18 – 2018-12-19 (×2): 10 mg via ORAL
  Filled 2018-12-17 (×2): qty 1

## 2018-12-17 MED ORDER — VITAMIN D 25 MCG (1000 UNIT) PO TABS
2000.0000 [IU] | ORAL_TABLET | Freq: Every day | ORAL | Status: DC
  Administered 2018-12-17 – 2018-12-19 (×3): 2000 [IU] via ORAL
  Filled 2018-12-17 (×3): qty 2

## 2018-12-17 MED ORDER — CLONAZEPAM 0.5 MG PO TABS
0.2500 mg | ORAL_TABLET | Freq: Two times a day (BID) | ORAL | Status: DC
  Administered 2018-12-17 – 2018-12-19 (×4): 0.25 mg via ORAL
  Filled 2018-12-17 (×5): qty 1

## 2018-12-17 MED ORDER — DOCUSATE SODIUM 100 MG PO CAPS
100.0000 mg | ORAL_CAPSULE | Freq: Two times a day (BID) | ORAL | Status: DC
  Administered 2018-12-17 – 2018-12-19 (×4): 100 mg via ORAL
  Filled 2018-12-17 (×5): qty 1

## 2018-12-17 MED ORDER — ACETAMINOPHEN 500 MG PO TABS
1000.0000 mg | ORAL_TABLET | Freq: Once | ORAL | Status: AC
  Administered 2018-12-17: 1000 mg via ORAL
  Filled 2018-12-17: qty 2

## 2018-12-17 MED ORDER — FENTANYL CITRATE (PF) 100 MCG/2ML IJ SOLN
25.0000 ug | INTRAMUSCULAR | Status: DC | PRN
  Administered 2018-12-17: 50 ug via INTRAVENOUS

## 2018-12-17 MED ORDER — FENTANYL CITRATE (PF) 100 MCG/2ML IJ SOLN
INTRAMUSCULAR | Status: DC | PRN
  Administered 2018-12-17: 100 ug via INTRAVENOUS

## 2018-12-17 MED ORDER — PROPOFOL 10 MG/ML IV BOLUS
INTRAVENOUS | Status: AC
  Filled 2018-12-17: qty 60

## 2018-12-17 MED ORDER — BUPIVACAINE-EPINEPHRINE (PF) 0.25% -1:200000 IJ SOLN
INTRAMUSCULAR | Status: AC
  Filled 2018-12-17: qty 30

## 2018-12-17 MED ORDER — MIDAZOLAM HCL 2 MG/2ML IJ SOLN
1.0000 mg | INTRAMUSCULAR | Status: DC
  Administered 2018-12-17: 2 mg via INTRAVENOUS
  Filled 2018-12-17: qty 2

## 2018-12-17 MED ORDER — ALBUMIN HUMAN 5 % IV SOLN
INTRAVENOUS | Status: DC | PRN
  Administered 2018-12-17 (×2): via INTRAVENOUS

## 2018-12-17 MED ORDER — SODIUM CHLORIDE 0.9 % IR SOLN
Status: DC | PRN
  Administered 2018-12-17: 1000 mL

## 2018-12-17 MED ORDER — PROPOFOL 500 MG/50ML IV EMUL
INTRAVENOUS | Status: DC | PRN
  Administered 2018-12-17: 20 mg via INTRAVENOUS
  Administered 2018-12-17: 30 mg via INTRAVENOUS
  Administered 2018-12-17: 20 mg via INTRAVENOUS

## 2018-12-17 MED ORDER — DRONEDARONE HCL 400 MG PO TABS
400.0000 mg | ORAL_TABLET | Freq: Two times a day (BID) | ORAL | Status: DC
  Administered 2018-12-17 – 2018-12-19 (×4): 400 mg via ORAL
  Filled 2018-12-17 (×5): qty 1

## 2018-12-17 MED ORDER — PROPOFOL 10 MG/ML IV BOLUS
INTRAVENOUS | Status: AC
  Filled 2018-12-17: qty 20

## 2018-12-17 MED ORDER — ONDANSETRON HCL 4 MG PO TABS: 4.0000 mg | ORAL_TABLET | Freq: Four times a day (QID) | ORAL | Status: DC | PRN

## 2018-12-17 MED ORDER — FENTANYL CITRATE (PF) 100 MCG/2ML IJ SOLN
50.0000 ug | INTRAMUSCULAR | Status: DC
  Administered 2018-12-17 (×2): 100 ug via INTRAVENOUS
  Filled 2018-12-17: qty 2

## 2018-12-17 MED ORDER — OXYCODONE HCL 5 MG PO TABS
5.0000 mg | ORAL_TABLET | ORAL | Status: DC | PRN
  Administered 2018-12-17: 5 mg via ORAL
  Administered 2018-12-17 – 2018-12-18 (×3): 10 mg via ORAL
  Filled 2018-12-17: qty 1
  Filled 2018-12-17 (×4): qty 2

## 2018-12-17 MED ORDER — MENTHOL 3 MG MT LOZG: 1.0000 | LOZENGE | OROMUCOSAL | Status: DC | PRN

## 2018-12-17 MED ORDER — HYDROMORPHONE HCL 1 MG/ML IJ SOLN
0.5000 mg | INTRAMUSCULAR | Status: DC | PRN
  Administered 2018-12-17: 0.5 mg via INTRAVENOUS
  Filled 2018-12-17: qty 1

## 2018-12-17 MED ORDER — METOCLOPRAMIDE HCL 5 MG/ML IJ SOLN: 5.0000 mg | Freq: Three times a day (TID) | INTRAMUSCULAR | Status: DC | PRN

## 2018-12-17 MED ORDER — ALBUTEROL SULFATE (2.5 MG/3ML) 0.083% IN NEBU: 3.0000 mL | INHALATION_SOLUTION | RESPIRATORY_TRACT | Status: DC | PRN

## 2018-12-17 MED ORDER — BUPIVACAINE-EPINEPHRINE 0.5% -1:200000 IJ SOLN: INTRAMUSCULAR | Status: DC | PRN

## 2018-12-17 MED ORDER — GLYCOPYRROLATE 0.2 MG/ML IJ SOLN
INTRAMUSCULAR | Status: DC | PRN
  Administered 2018-12-17: 0.2 mg via INTRAVENOUS

## 2018-12-17 MED ORDER — FENTANYL CITRATE (PF) 100 MCG/2ML IJ SOLN
INTRAMUSCULAR | Status: AC
  Filled 2018-12-17: qty 2

## 2018-12-17 MED ORDER — PREDNISONE 5 MG PO TABS
2.5000 mg | ORAL_TABLET | Freq: Every day | ORAL | Status: DC
  Administered 2018-12-18 – 2018-12-19 (×2): 2.5 mg via ORAL
  Filled 2018-12-17 (×2): qty 1

## 2018-12-17 MED ORDER — BUPIVACAINE-EPINEPHRINE (PF) 0.25% -1:200000 IJ SOLN
INTRAMUSCULAR | Status: DC | PRN
  Administered 2018-12-17: 30 mL

## 2018-12-17 MED ORDER — TRANEXAMIC ACID-NACL 1000-0.7 MG/100ML-% IV SOLN
1000.0000 mg | INTRAVENOUS | Status: AC
  Administered 2018-12-17: 1000 mg via INTRAVENOUS
  Filled 2018-12-17: qty 100

## 2018-12-17 MED ORDER — PROMETHAZINE HCL 25 MG/ML IJ SOLN: 6.2500 mg | INTRAMUSCULAR | Status: DC | PRN

## 2018-12-17 MED ORDER — PROPOFOL 500 MG/50ML IV EMUL
INTRAVENOUS | Status: DC | PRN
  Administered 2018-12-17: 75 ug/kg/min via INTRAVENOUS

## 2018-12-17 MED ORDER — BUPIVACAINE HCL (PF) 0.75 % IJ SOLN
INTRAMUSCULAR | Status: DC | PRN
  Administered 2018-12-17: 1.6 mL via INTRATHECAL

## 2018-12-17 MED ORDER — METHOCARBAMOL 500 MG PO TABS
500.0000 mg | ORAL_TABLET | Freq: Four times a day (QID) | ORAL | Status: DC | PRN
  Administered 2018-12-17 – 2018-12-19 (×4): 500 mg via ORAL
  Filled 2018-12-17 (×4): qty 1

## 2018-12-17 MED ORDER — METHOCARBAMOL 500 MG IVPB - SIMPLE MED
INTRAVENOUS | Status: AC
  Filled 2018-12-17: qty 50

## 2018-12-17 MED ORDER — METHOCARBAMOL 500 MG IVPB - SIMPLE MED
500.0000 mg | Freq: Four times a day (QID) | INTRAVENOUS | Status: DC | PRN
  Administered 2018-12-17: 500 mg via INTRAVENOUS
  Filled 2018-12-17: qty 50

## 2018-12-17 MED ORDER — METOCLOPRAMIDE HCL 5 MG PO TABS: 5.0000 mg | ORAL_TABLET | Freq: Three times a day (TID) | ORAL | Status: DC | PRN

## 2018-12-17 MED ORDER — LEVOTHYROXINE SODIUM 75 MCG PO TABS
75.0000 ug | ORAL_TABLET | Freq: Every day | ORAL | Status: DC
  Administered 2018-12-18 – 2018-12-19 (×2): 75 ug via ORAL
  Filled 2018-12-17 (×2): qty 1

## 2018-12-17 MED ORDER — ALBUMIN HUMAN 5 % IV SOLN
INTRAVENOUS | Status: AC
  Filled 2018-12-17: qty 750

## 2018-12-17 MED ORDER — CLINDAMYCIN PHOSPHATE 900 MG/50ML IV SOLN
900.0000 mg | INTRAVENOUS | Status: AC
  Administered 2018-12-17: 900 mg via INTRAVENOUS
  Filled 2018-12-17: qty 50

## 2018-12-17 MED ORDER — AMITRIPTYLINE HCL 10 MG PO TABS
10.0000 mg | ORAL_TABLET | Freq: Every day | ORAL | Status: DC
  Administered 2018-12-17 – 2018-12-18 (×2): 10 mg via ORAL
  Filled 2018-12-17 (×2): qty 1

## 2018-12-17 MED ORDER — METOPROLOL TARTRATE 25 MG PO TABS
25.0000 mg | ORAL_TABLET | Freq: Two times a day (BID) | ORAL | Status: DC
  Administered 2018-12-17 – 2018-12-19 (×4): 25 mg via ORAL
  Filled 2018-12-17 (×4): qty 1

## 2018-12-17 MED ORDER — MAGNESIUM OXIDE 400 (241.3 MG) MG PO TABS
400.0000 mg | ORAL_TABLET | Freq: Two times a day (BID) | ORAL | Status: DC
  Administered 2018-12-17 – 2018-12-19 (×4): 400 mg via ORAL
  Filled 2018-12-17 (×4): qty 1

## 2018-12-17 MED ORDER — CHLORHEXIDINE GLUCONATE 4 % EX LIQD: 60.0000 mL | Freq: Once | CUTANEOUS | Status: DC

## 2018-12-17 MED ORDER — LACTATED RINGERS IV SOLN
INTRAVENOUS | Status: DC
  Administered 2018-12-17 (×3): via INTRAVENOUS

## 2018-12-17 MED ORDER — OXYCODONE HCL 5 MG PO TABS
10.0000 mg | ORAL_TABLET | ORAL | Status: DC | PRN
  Administered 2018-12-17: 10 mg via ORAL
  Administered 2018-12-18 – 2018-12-19 (×5): 15 mg via ORAL
  Filled 2018-12-17 (×5): qty 3

## 2018-12-17 MED ORDER — DILTIAZEM HCL ER COATED BEADS 120 MG PO CP24
120.0000 mg | ORAL_CAPSULE | Freq: Every day | ORAL | Status: DC
  Administered 2018-12-18 – 2018-12-19 (×2): 120 mg via ORAL
  Filled 2018-12-17 (×2): qty 1

## 2018-12-17 MED ORDER — APIXABAN 5 MG PO TABS
5.0000 mg | ORAL_TABLET | Freq: Two times a day (BID) | ORAL | Status: DC
  Administered 2018-12-18 – 2018-12-19 (×3): 5 mg via ORAL
  Filled 2018-12-17 (×3): qty 1

## 2018-12-17 MED ORDER — LOSARTAN POTASSIUM 50 MG PO TABS
100.0000 mg | ORAL_TABLET | Freq: Every day | ORAL | Status: DC
  Administered 2018-12-18 – 2018-12-19 (×2): 100 mg via ORAL
  Filled 2018-12-17 (×2): qty 2

## 2018-12-17 MED ORDER — SODIUM CHLORIDE 0.9 % IV SOLN
INTRAVENOUS | Status: DC
  Administered 2018-12-17: 16:00:00 via INTRAVENOUS

## 2018-12-17 MED ORDER — PHENOL 1.4 % MT LIQD
1.0000 | OROMUCOSAL | Status: DC | PRN
  Filled 2018-12-17: qty 177

## 2018-12-17 MED ORDER — STERILE WATER FOR IRRIGATION IR SOLN
Status: DC | PRN
  Administered 2018-12-17: 2000 mL

## 2018-12-17 MED ORDER — 0.9 % SODIUM CHLORIDE (POUR BTL) OPTIME
TOPICAL | Status: DC | PRN
  Administered 2018-12-17: 1000 mL

## 2018-12-17 MED ORDER — ONDANSETRON HCL 4 MG/2ML IJ SOLN
INTRAMUSCULAR | Status: DC | PRN
  Administered 2018-12-17: 4 mg via INTRAVENOUS

## 2018-12-17 MED ORDER — BUPIVACAINE-EPINEPHRINE (PF) 0.5% -1:200000 IJ SOLN
INTRAMUSCULAR | Status: DC | PRN
  Administered 2018-12-17: 30 mL via PERINEURAL

## 2018-12-17 MED ORDER — ALUM & MAG HYDROXIDE-SIMETH 200-200-20 MG/5ML PO SUSP: 30.0000 mL | ORAL | Status: DC | PRN

## 2018-12-17 MED ORDER — CLINDAMYCIN PHOSPHATE 600 MG/50ML IV SOLN
600.0000 mg | Freq: Four times a day (QID) | INTRAVENOUS | Status: AC
  Administered 2018-12-17 – 2018-12-18 (×2): 600 mg via INTRAVENOUS
  Filled 2018-12-17 (×2): qty 50

## 2018-12-17 MED ORDER — PANTOPRAZOLE SODIUM 40 MG PO TBEC
40.0000 mg | DELAYED_RELEASE_TABLET | Freq: Every day | ORAL | Status: DC
  Administered 2018-12-17 – 2018-12-19 (×3): 40 mg via ORAL
  Filled 2018-12-17 (×3): qty 1

## 2018-12-17 MED ORDER — VITAMIN C 500 MG PO TABS
500.0000 mg | ORAL_TABLET | Freq: Every day | ORAL | Status: DC
  Administered 2018-12-18 – 2018-12-19 (×2): 500 mg via ORAL
  Filled 2018-12-17 (×2): qty 1

## 2018-12-17 MED ORDER — DIPHENHYDRAMINE HCL 12.5 MG/5ML PO ELIX: 12.5000 mg | ORAL_SOLUTION | ORAL | Status: DC | PRN

## 2018-12-17 MED ORDER — SODIUM CHLORIDE 0.9 % IV SOLN
INTRAVENOUS | Status: DC | PRN
  Administered 2018-12-17: 30 ug/min via INTRAVENOUS

## 2018-12-17 MED ORDER — ACETAMINOPHEN 325 MG PO TABS
325.0000 mg | ORAL_TABLET | Freq: Four times a day (QID) | ORAL | Status: DC | PRN
  Administered 2018-12-17: 650 mg via ORAL
  Filled 2018-12-17: qty 2

## 2018-12-17 SURGICAL SUPPLY — 58 items
APL SKNCLS STERI-STRIP NONHPOA (GAUZE/BANDAGES/DRESSINGS)
BAG SPEC THK2 15X12 ZIP CLS (MISCELLANEOUS)
BAG ZIPLOCK 12X15 (MISCELLANEOUS) IMPLANT
BANDAGE ACE 6X5 VEL STRL LF (GAUZE/BANDAGES/DRESSINGS) ×3 IMPLANT
BANDAGE ELASTIC 6 VELCRO ST LF (GAUZE/BANDAGES/DRESSINGS) ×4 IMPLANT
BASEPLATE TIBIAL TRIATHALON 3 (Plate) ×2 IMPLANT
BENZOIN TINCTURE PRP APPL 2/3 (GAUZE/BANDAGES/DRESSINGS) IMPLANT
BLADE SAG 18X100X1.27 (BLADE) IMPLANT
BLADE SURG SZ10 CARB STEEL (BLADE) ×6 IMPLANT
BONE CEMENT SIMPLEX TOBRAMYCIN (Cement) ×3 IMPLANT
BOWL SMART MIX CTS (DISPOSABLE) IMPLANT
BSPLAT TIB 3 CMNT PRM STRL KN (Plate) ×1 IMPLANT
CEMENT BONE SIMPLEX SPEEDSET (Cement) ×4 IMPLANT
CEMENT BONE SIMPLEX TOBRAMYCIN (Cement) IMPLANT
CLOSURE WOUND 1/2 X4 (GAUZE/BANDAGES/DRESSINGS)
COVER SURGICAL LIGHT HANDLE (MISCELLANEOUS) ×3 IMPLANT
COVER WAND RF STERILE (DRAPES) IMPLANT
CUFF TOURN SGL QUICK 34 (TOURNIQUET CUFF) ×3
CUFF TRNQT CYL 34X4.125X (TOURNIQUET CUFF) ×1 IMPLANT
DECANTER SPIKE VIAL GLASS SM (MISCELLANEOUS) IMPLANT
DRAPE U-SHAPE 47X51 STRL (DRAPES) ×3 IMPLANT
DRSG PAD ABDOMINAL 8X10 ST (GAUZE/BANDAGES/DRESSINGS) ×5 IMPLANT
DURAPREP 26ML APPLICATOR (WOUND CARE) ×3 IMPLANT
ELECT REM PT RETURN 15FT ADLT (MISCELLANEOUS) ×3 IMPLANT
FEMORAL PEG DISTAL FIXATION (Orthopedic Implant) ×2 IMPLANT
FEMORAL TRIATH POST STAB SZ 4 (Orthopedic Implant) ×2 IMPLANT
GAUZE SPONGE 4X4 12PLY STRL (GAUZE/BANDAGES/DRESSINGS) ×3 IMPLANT
GAUZE XEROFORM 1X8 LF (GAUZE/BANDAGES/DRESSINGS) ×2 IMPLANT
GLOVE BIO SURGEON STRL SZ7.5 (GLOVE) ×3 IMPLANT
GLOVE BIOGEL PI IND STRL 8 (GLOVE) ×2 IMPLANT
GLOVE BIOGEL PI INDICATOR 8 (GLOVE) ×4
GLOVE ECLIPSE 8.0 STRL XLNG CF (GLOVE) ×3 IMPLANT
GOWN STRL REUS W/TWL XL LVL3 (GOWN DISPOSABLE) ×6 IMPLANT
HANDPIECE INTERPULSE COAX TIP (DISPOSABLE) ×3
HOLDER FOLEY CATH W/STRAP (MISCELLANEOUS) IMPLANT
IMMOBILIZER KNEE 20 (SOFTGOODS) ×5 IMPLANT
IMMOBILIZER KNEE 20 THIGH 36 (SOFTGOODS) ×1 IMPLANT
KNEE INSERT TIB 3X16 (Insert) ×2 IMPLANT
NS IRRIG 1000ML POUR BTL (IV SOLUTION) ×3 IMPLANT
PACK TOTAL KNEE CUSTOM (KITS) ×3 IMPLANT
PADDING CAST COTTON 6X4 STRL (CAST SUPPLIES) ×6 IMPLANT
PATELLA TRIATHLON SZ 29 9 MM (Orthopedic Implant) ×2 IMPLANT
PIN FLUTED HEDLESS FIX 3.5X1/8 (Miscellaneous) ×2 IMPLANT
PROTECTOR NERVE ULNAR (MISCELLANEOUS) ×3 IMPLANT
SET HNDPC FAN SPRY TIP SCT (DISPOSABLE) ×1 IMPLANT
SET PAD KNEE POSITIONER (MISCELLANEOUS) ×3 IMPLANT
STAPLER VISISTAT 35W (STAPLE) IMPLANT
STRIP CLOSURE SKIN 1/2X4 (GAUZE/BANDAGES/DRESSINGS) IMPLANT
SUT MNCRL AB 4-0 PS2 18 (SUTURE) IMPLANT
SUT VIC AB 0 CT1 27 (SUTURE) ×3
SUT VIC AB 0 CT1 27XBRD ANTBC (SUTURE) ×1 IMPLANT
SUT VIC AB 1 CT1 36 (SUTURE) ×8 IMPLANT
SUT VIC AB 2-0 CT1 27 (SUTURE) ×6
SUT VIC AB 2-0 CT1 TAPERPNT 27 (SUTURE) ×2 IMPLANT
TRAY FOLEY CATH 14FR (SET/KITS/TRAYS/PACK) ×2 IMPLANT
WATER STERILE IRR 1000ML POUR (IV SOLUTION) ×3 IMPLANT
WRAP KNEE MAXI GEL POST OP (GAUZE/BANDAGES/DRESSINGS) ×3 IMPLANT
YANKAUER SUCT BULB TIP 10FT TU (MISCELLANEOUS) ×3 IMPLANT

## 2018-12-17 NOTE — Anesthesia Procedure Notes (Signed)
Anesthesia Regional Block: Adductor canal block   Pre-Anesthetic Checklist: ,, timeout performed, Correct Patient, Correct Site, Correct Laterality, Correct Procedure, Correct Position, site marked, Risks and benefits discussed,  Surgical consent,  Pre-op evaluation,  At surgeon's request and post-op pain management  Laterality: Right  Prep: chloraprep       Needles:  Injection technique: Single-shot  Needle Type: Stimulator Needle - 80     Needle Length: 10cm  Needle Gauge: 21     Additional Needles:   Narrative:  Start time: 12/17/2018 8:54 AM End time: 12/17/2018 9:04 AM Injection made incrementally with aspirations every 5 mL.  Performed by: Personally

## 2018-12-17 NOTE — Plan of Care (Signed)
Plan of care 

## 2018-12-17 NOTE — Anesthesia Procedure Notes (Signed)
Spinal  Patient location during procedure: OR Start time: 12/17/2018 10:02 AM End time: 12/17/2018 10:18 AM Staffing Anesthesiologist: Heather Roberts, MD Performed: anesthesiologist  Preanesthetic Checklist Completed: patient identified, surgical consent, pre-op evaluation, timeout performed, IV checked, risks and benefits discussed and monitors and equipment checked Spinal Block Patient position: sitting Prep: DuraPrep Patient monitoring: cardiac monitor, continuous pulse ox and blood pressure Approach: midline Location: L2-3 Injection technique: single-shot Needle Needle type: Quincke  Needle gauge: 22 G Needle length: 9 cm Additional Notes Functioning IV was confirmed and monitors were applied. Sterile prep and drape, including hand hygiene and sterile gloves were used. The patient was positioned and the spine was prepped. The skin was anesthetized with lidocaine.  Free flow of clear CSF was obtained prior to injecting local anesthetic into the CSF.  The spinal needle aspirated freely following injection.  The needle was carefully withdrawn.  The patient tolerated the procedure well. Difficult placement requiring multiple attempts by Marletta Lor and myself.

## 2018-12-17 NOTE — Op Note (Signed)
NAME: Heather, [name] MEDICAL RECORD BS:[id number] ACCOUNT [id number] DATE OF BIRTH:March 23, 1951 FACILITY: [facility] LOCATION: [facility] PHYSICIAN:Brienna Bass Aretha Parrot, MD  OPERATIVE REPORT  DATE OF PROCEDURE:  12/17/2018  PREOPERATIVE DIAGNOSES:   1.  Severe end-stage primary and degenerative joint disease, right knee. 2.  Morbid obesity with a body mass index of 40.  POSTOPERATIVE DIAGNOSES:   1.  Severe end-stage primary and degenerative joint disease, right knee. 2.  Morbid obesity with a body mass index of 40.  PROCEDURE:  Right total knee arthroplasty.  IMPLANTS:  Stryker Triathlon cemented knee system with size 4 femur, size 3 tibial tray, 60 mm fixed bearing polyethylene insert, size 29 patellar button.  SURGEON:  Vanita Panda. Magnus Ivan, MD  ASSISTANT:  Richardean Canal, PA-C.  ANESTHESIA: 1.  Right lower extremity adductor canal block. 2.  Spinal. 3.  Local intracapsular injection with 0.25% Marcaine with epinephrine.  ANTIBIOTICS:  900 mg IV clindamycin.  TOURNIQUET TIME:  Just over 1 hour.  ESTIMATED BLOOD LOSS:  Less than 200 mL.  COMPLICATIONS:  None.  INDICATIONS:  The patient is a morbidly obese 68 year old female that I have known for well over a decade now.  I have actually replaced both her hips over time.  Her right knee has been severely arthritic and well documented for many years now.  She has  finally lost a significant amount of weight and she has failed conservative treatment with multiple injections.  She is someone who has multiple medical issues, mainly breathing wise but the last we saw her in the office, her medical issues had  stabilized and she was continuing to lose weight, so we felt comfortable with proceeding with total knee arthroplasty at this point, still understanding that she has a heightened risk of acute blood loss anemia, nerve or vessel injury, fracture,  infection, DVT, and implant failure given her morbid obesity.  She understands our  goals are to decrease pain, improve mobility and overall improve quality of life.  DESCRIPTION OF PROCEDURE:  After informed consent was obtained and appropriate right knee was marked, anesthesia obtained and an adductor canal block in the holding room.  She was then brought to the operating room and sat up on the operating table where  spinal anesthesia was obtained.  She was then laid in supine position on the operating table.  A Foley catheter was placed and a nonsterile tourniquet was placed around her upper right thigh.  Her right thigh, knee, leg, ankle and foot were prepped and  draped with ChloraPrep in sterile drapes.  A time-out was called to identify correct patient, correct right knee.  We then used an Esmarch to wrap that leg and tourniquet was inflated to 300 mm of pressure.  We then made a direct midline incision over  the patella and carried this proximally and distally.  We dissected down the knee joint and carried out a medial parapatellar arthrotomy finding moderate joint effusion and severe osteoarthritis throughout her knee.  There was essentially almost no joint  space remaining.  With the knee in a flexed position, we removed remnants of ACL, PCL, medial and lateral meniscus.  We set her extramedullary cutting guide for taking 9 mm off the high side the tibia and made this cut without difficulty.  We then used  an intramedullary guide for the femur by making our distal femoral cut for a 10 mm distal femoral cut setting our distal femoral cutting guide at 5 degrees externally rotated as well.  We made  that cut without difficulty and brought the knee back down to  full extension and she achieved better extension with 11 mm extension block, but certainly I knew that I was going to be up higher size poly wise due to her significant obesity and the tendency for these types of patients to still require a thicker poly  for stabilization.  We then went back to the femur and put our femoral  sizing guide based off the epicondylar axis.  Based off of this, we chose a size 4 femur, we put a 4-in-1 cutting block for size 4 femur, made our anterior and posterior cuts  followed by our chamfer cuts.  We then made our femoral box cut.  Attention was then turned back to the tibia.  We chose a size 3 tibia tray for coverage of the tibia setting the rotation off the femur and the tibial tubercle.  We made our keel punch off  of this.  With a size 3 tibia and the size 4 femur trials in place, we tried a 13 mm fixed bearing polyethylene insert.  We still felt that she was slightly hyperextended.  We made our patellar cut and drilled 3 holes for size 29 patellar button.  We  then removed all instrumentation from the knee and irrigated the knee with normal saline solution.  I then placed my Marcaine intracapsular injection around the knee.  Our cement was mixed and then we cemented the real Stryker Triathlon size 3 tibial  tray followed by the size 4 femur.  We removed cement debris from the knee and placed our real 16 mm fixed bearing polyethylene insert because I felt like she needed that for stability and we were able to place that in the knee.  We then cemented our  patellar button.  Once the cement had hardened, we let the tourniquet down.  Hemostasis obtained with electrocautery.  I put the knee through a range of motion.  I was pleased with stability.  We then closed the capsule with interrupted #1 Vicryl suture  followed by 0 Vicryl in the deep tissue, 2-0 Vicryl subcutaneous tissue and interrupted staples on the skin.  Xeroform well-padded sterile dressing was applied.  She was taken to recovery room in stable condition.  All final counts were correct.  There  were no complications noted.  Of note, Rexene Edison, PA-C, assisted in the entire case.  His assistance was crucial for facilitating all aspects of this case.  TN/NUANCE  D:12/17/2018 T:12/17/2018 JOB:[id number]

## 2018-12-17 NOTE — Transfer of Care (Signed)
Immediate Anesthesia Transfer of Care Note  Patient: Heather [name] [facility]  Procedure(s) Performed: RIGHT TOTAL KNEE ARTHROPLASTY (Right Knee)  Patient Location: PACU  Anesthesia Type:Spinal  Level of Consciousness: awake, alert  and oriented  Airway & Oxygen Therapy: Patient Spontanous Breathing and Patient connected to face mask oxygen  Post-op Assessment: Report given to RN and Post -op Vital signs reviewed and stable  Post vital signs: Reviewed and stable  Last Vitals:  Vitals Value Taken Time  BP 108/64 12/17/2018 12:35 PM  Temp    Pulse 57 12/17/2018 12:37 PM  Resp 14 12/17/2018 12:37 PM  SpO2 100 % 12/17/2018 12:37 PM  Vitals shown include unvalidated device data.  Last Pain:  Vitals:   12/17/18 0734  TempSrc:   PainSc: 4       Patients Stated Pain Goal: 4 (12/17/18 0734)  Complications: No apparent anesthesia complications

## 2018-12-17 NOTE — Anesthesia Preprocedure Evaluation (Addendum)
Anesthesia Evaluation  Patient identified by MRN, date of birth, ID band Patient awake    Reviewed: Allergy & Precautions, NPO status , Patient's Chart, lab work & pertinent test results, reviewed documented beta blocker date and time   History of Anesthesia Complications Negative for: history of anesthetic complications  Airway Mallampati: II  TM Distance: >3 FB Neck ROM: Full    Dental  (+) Dental Advisory Given, Teeth Intact   Pulmonary shortness of breath and Long-Term Oxygen Therapy, sleep apnea, Continuous Positive Airway Pressure Ventilation and Oxygen sleep apnea ,    breath sounds clear to auscultation       Cardiovascular hypertension, Pt. on home beta blockers and Pt. on medications + Peripheral Vascular Disease and +CHF  + dysrhythmias Atrial Fibrillation  Rhythm:Regular Rate:Bradycardia   Pulmonary HTN  '19 TTE - EF 60% to 65%. Aortic sclerosis, mild AI. The ascending aorta was mildly dilated, 39 mm. Mild MR. RV cavity size was mildly dilated. RA was severely dilated. Moderate TR, mild PR. PASP 76 mm Hg    Neuro/Psych negative neurological ROS  negative psych ROS   GI/Hepatic Neg liver ROS, GERD  Controlled, IBS    Endo/Other  Hypothyroidism Morbid obesity  Renal/GU negative Renal ROS  negative genitourinary   Musculoskeletal  (+) Arthritis ,   Abdominal (+) + obese,   Peds  Hematology negative hematology ROS (+)   Anesthesia Other Findings Anaphylaxis to ceftriaxone  Reproductive/Obstetrics                             Anesthesia Physical  Anesthesia Plan  ASA: III  Anesthesia Plan: Spinal   Post-op Pain Management:    Induction: Intravenous  PONV Risk Score and Plan: 2 and Treatment may vary due to age or medical condition, Ondansetron and Propofol infusion  Airway Management Planned: Natural Airway  Additional Equipment: None  Intra-op Plan:    Post-operative Plan:   Informed Consent: I have reviewed the patients History and Physical, chart, labs and discussed the procedure including the risks, benefits and alternatives for the proposed anesthesia with the patient or authorized representative who has indicated his/her understanding and acceptance.     Dental advisory given  Plan Discussed with: CRNA and Anesthesiologist  Anesthesia Plan Comments:         Anesthesia Quick Evaluation

## 2018-12-17 NOTE — Brief Op Note (Signed)
12/17/2018  11:52 AM  PATIENT:  Heather [name]  68 y.o. female  PRE-OPERATIVE DIAGNOSIS:  osteoarthritis right knee  POST-OPERATIVE DIAGNOSIS:  osteoarthritis right knee  PROCEDURE:  Procedure(s): RIGHT TOTAL KNEE ARTHROPLASTY (Right)  SURGEON:  Surgeon(s) and Role:    Kathryne Hitch, MD - Primary  PHYSICIAN ASSISTANT: Rexene Edison, PA-C  ANESTHESIA:   local, regional and spinal  EBL:  50 mL   COUNTS:  YES  TOURNIQUET:   Total Tourniquet Time Documented: Thigh (Right) - 61 minutes Total: Thigh (Right) - 61 minutes   DICTATION: .Other Dictation: Dictation Number [id number]  PLAN OF CARE: Admit to inpatient   PATIENT DISPOSITION:  PACU - hemodynamically stable.   Delay start of Pharmacological VTE agent (>24hrs) due to surgical blood loss or risk of bleeding: no

## 2018-12-17 NOTE — Progress Notes (Signed)
Assisted Dr. Singer with right, ultrasound guided, adductor canal block. Side rails up, monitors on throughout procedure. See vital signs in flow sheet. Tolerated Procedure well.  

## 2018-12-17 NOTE — Anesthesia Postprocedure Evaluation (Signed)
Anesthesia Post Note  Patient: Heather [name]  Procedure(s) Performed: RIGHT TOTAL KNEE ARTHROPLASTY (Right Knee)     Patient location during evaluation: PACU Anesthesia Type: Spinal Level of consciousness: awake and alert Pain management: pain level controlled Vital Signs Assessment: post-procedure vital signs reviewed and stable Respiratory status: spontaneous breathing and respiratory function stable Cardiovascular status: blood pressure returned to baseline and stable Postop Assessment: spinal receding Anesthetic complications: no    Last Vitals:  Vitals:   12/17/18 1400 12/17/18 1429  BP: 110/79 118/82  Pulse: (!) 53 (!) 53  Resp: 14 18  Temp:  (!) 36.3 C  SpO2: 100% 100%    Last Pain:  Vitals:   12/17/18 1429  TempSrc: Oral  PainSc:                  Heather [name]

## 2018-12-17 NOTE — Care Management Note (Signed)
Case Management Note  Patient Details  Name: ANTIANA VAUSE MRN: [id number] Date of Birth: [birth year]  Subjective/Objective:                  Discharge planning  Action/Plan: See [facility] for hhc  Expected Discharge Date:                  Expected Discharge Plan:  Home w Home Health Services  In-House Referral:     Discharge planning Services  CM Consult  Post Acute Care Choice:    Choice offered to:     DME Arranged:    DME Agency:     HH Arranged:  PT HH Agency:  [facility]  Status of Service:  In process, will continue to follow  If discussed at Long Length of Stay Meetings, dates discussed:    Additional Comments:  Golda Acre, RN 12/17/2018, 10:56 AM

## 2018-12-17 NOTE — H&P (Signed)
TOTAL KNEE ADMISSION H&P  Patient is being admitted for right total knee arthroplasty.  Subjective:  Chief Complaint:right knee pain.  HPI: Heather [name] [facility], 68 y.o. female, has a history of pain and functional disability in the right knee due to arthritis and has failed non-surgical conservative treatments for greater than 12 weeks to includeNSAID's and/or analgesics, corticosteriod injections, viscosupplementation injections, flexibility and strengthening excercises, supervised PT with diminished ADL's post treatment, use of assistive devices, weight reduction as appropriate and activity modification.  Onset of symptoms was gradual, starting 5 years ago with gradually worsening course since that time. The patient noted prior procedures on the knee to include  arthroscopy on the right knee(s).  Patient currently rates pain in the right knee(s) at 10 out of 10 with activity. Patient has night pain, worsening of pain with activity and weight bearing, pain that interferes with activities of daily living, pain with passive range of motion, crepitus and joint swelling.  Patient has evidence of subchondral cysts, subchondral sclerosis, periarticular osteophytes and joint space narrowing by imaging studies. There is no active infection.  Patient Active Problem List   Diagnosis Date Noted  . S/P laparoscopic sleeve gastrectomy 06/10/2018  . Morbid obesity with BMI of 50.0-59.9, adult (HCC) 05/24/2018  . RML pneumonia (HCC) 03/29/2018  . Lymphedema of both lower extremities 08/24/2017  . Venous insufficiency of both lower extremities 04/27/2017  . Edema 04/27/2017  . Interstitial pneumonia (HCC) 03/02/2017  . Chronic pain of right knee 02/23/2017  . Unilateral primary osteoarthritis, right knee 02/23/2017  . Chronic diastolic congestive heart failure (HCC) 01/29/2017  . Status post arthroscopy of right knee 11/25/2016  . Acute medial meniscal tear, right, initial encounter 11/06/2016  . Pulmonary  hypertension (HCC) 03/22/2015  . Sleep apnea   . Atypical atrial flutter (HCC) 11/27/2013  . History of laparoscopic adjustable gastric banding, APS, 05/09/2008 12/22/2012  . Fatigue 11/23/2012  . Thyroid disease 09/24/2012  . Degenerative disc disease, lumbar 09/24/2012  . Morbid obesity (HCC) 07/30/2012  . Chronic anticoagulation 04/01/2012  . OSA (obstructive sleep apnea) 04/01/2012  . Atrial fibrillation (HCC) 03/24/2012  . Decreased libido 03/01/2012  . Carpal tunnel syndrome   . Essential hypertension 06/20/2010  . Shortness of breath 12/13/2009   Past Medical History:  Diagnosis Date  . Anemia    "onc;e; had to take iron for awhile"  . Atrial fibrillation (HCC)    Began in 2004.  Had ablations at [facility] in 2005 and 2007.  She is off coumadin  now with no noted recurrent atrial fibrillation since 2007. til 04/01/12; s/p initiation of Rhythmol with DCCV June 2013; reports 05-18-18 that she is in aflutter   . Carpal tunnel syndrome   . Degenerative disk disease   . GERD (gastroesophageal reflux disease)    resolved after lap band  . History of blood transfusion    "w/both hip replacements"  . HTN (hypertension)   . Hypothyroidism   . IBS (irritable bowel syndrome)   . Interstitial cystitis   . Morton's neuroma    right foot  . Obesity   . On home oxygen therapy    2L continuous daytime; at night uses 3L with CPAP at nighttime   . Pneumonitis dx 1 year ago   cryptogenic organizing pneumonitis; treated with prednisone   . Pulmonary hypertension (HCC)   . Shortness of breath    only without oxygen  . Sleep apnea 04/01/12   "dx'd just last week"  . Sleep apnea  uses CPAP, 11 CWP with residual AHI 6.3    Past Surgical History:  Procedure Laterality Date  . ANTERIOR AND POSTERIOR REPAIR  10/20/2012   Procedure: ANTERIOR (CYSTOCELE) AND POSTERIOR REPAIR (RECTOCELE);  Surgeon: Esmeralda Arthur, MD;  Location: [facility] ORS;  Service: Gynecology;  Laterality: N/A;  2 hours  .  ATRIAL FIBRILLATION ABLATION N/A 12/30/2012   Procedure: ATRIAL FIBRILLATION ABLATION;  Surgeon: Hillis Range, MD;  Location: [facility] CATH LAB;  Service: Cardiovascular;  Laterality: N/A;  . BACK SURGERY    . CARDIAC CATHETERIZATION    . CARDIAC ELECTROPHYSIOLOGY MAPPING AND ABLATION  ~ 2005 and 2007   "did more 2nd time; both at [facility]"  . CARDIOVERSION  04/02/2012   Procedure: CARDIOVERSION;  Surgeon: Laurey Morale, MD;  Location: [facility] OR;  Service: Cardiovascular;  Laterality: N/A;  . CARDIOVERSION  06/16/2012   Procedure: CARDIOVERSION;  Surgeon: [name], MD;  Location: [facility] ENDOSCOPY;  Service: Cardiovascular;  Laterality: N/A;  amanda/ebp/Beverly( or scheduling)  . CARDIOVERSION  10/29/2012   Procedure: CARDIOVERSION;  Surgeon: Laurey Morale, MD;  Location: [facility] ENDOSCOPY;  Service: Cardiovascular;  Laterality: N/A;  . CARDIOVERSION N/A 03/30/2013   Procedure: CARDIOVERSION;  Surgeon: Laurey Morale, MD;  Location: [facility] ENDOSCOPY;  Service: Cardiovascular;  Laterality: N/A;  . CARDIOVERSION N/A 12/23/2013   Procedure: CARDIOVERSION;  Surgeon: Lars Masson, MD;  Location: [facility] ENDOSCOPY;  Service: Cardiovascular;  Laterality: N/A;  9:27 Propofol 70mg , IV   150 joules synched shock by Dr. Delton See @ 150 joules...SR   post 12 lead ordered.   . CARPAL TUNNEL RELEASE  1990's   bilaterally  . DILATION AND CURETTAGE OF UTERUS  2000  . JOINT REPLACEMENT     bilateral hips  . KNEE ARTHROSCOPY Right 11/18/2016   Procedure: RIGHT KNEE ARTHROSCOPY WITH DEBRIDEMENT;  Surgeon: Kathryne Hitch, MD;  Location: [facility] OR;  Service: Orthopedics;  Laterality: Right;  . LAPAROSCOPIC GASTRIC BAND REMOVAL WITH LAPAROSCOPIC GASTRIC SLEEVE RESECTION N/A 05/24/2018   Procedure: LAPAROSCOPIC GASTRIC BAND REMOVAL WITH LAPAROSCOPIC , GASTRIC SLEEVE RESECTION, UPPER ENDO, ERAS Pathway;  Surgeon: Ovidio Kin, MD;  Location: [facility] ORS;  Service: General;  Laterality: N/A;  . LAPAROSCOPIC GASTRIC BANDING  2009  .  LASIK    . POSTERIOR FUSION LUMBAR SPINE  2000's   "nerve problems"  . POSTERIOR FUSION LUMBAR SPINE  2000's  . TEE WITHOUT CARDIOVERSION N/A 12/29/2012   Procedure: TRANSESOPHAGEAL ECHOCARDIOGRAM (TEE);  Surgeon: Peter M Swaziland, MD;  Location: [facility] ENDOSCOPY;  Service: Cardiovascular;  Laterality: N/A;  . TONSILLECTOMY AND ADENOIDECTOMY  1957  . TOTAL HIP ARTHROPLASTY  ~2009; 2010   right; left  . TUBAL LIGATION  1980  . VAGINAL HYSTERECTOMY  2000    No current facility-administered medications for this encounter.    Allergies  Allergen Reactions  . Cephalexin Anaphylaxis and Hives    "throat started closing up"  . Rocephin [Ceftriaxone Sodium In Dextrose] Anaphylaxis  . Penicillins Rash    Has patient had a PCN reaction causing immediate rash, facial/tongue/throat swelling, SOB or lightheadedness with hypotension: no Has patient had a PCN reaction causing severe rash involving mucus membranes or skin necrosis: no Has patient had a PCN reaction that required hospitalization no Has patient had a PCN reaction occurring within the last 10 years: no If all of the above answers are "NO", then may proceed with Cephalosporin use.  . Flecainide Acetate Other (See Comments)    Jittery   . Moxifloxacin  Does not remember the reaction  . Sulfonamide Derivatives     Does not remember reaction ; "was so young when I had reaction to it"    Social History   Tobacco Use  . Smoking status: Never Smoker  . Smokeless tobacco: Never Used  Substance Use Topics  . Alcohol use: No    Family History  Problem Relation Age of Onset  . Hypertension Father   . Diabetes Father   . Dementia Father   . Atrial fibrillation Mother   . Diabetes Sister   . Diabetes Brother   . Heart disease Maternal Grandmother   . Heart attack Other        granfather     Review of Systems  Musculoskeletal: Positive for joint pain.  All other systems reviewed and are negative.   Objective:  Physical Exam   Constitutional: She is oriented to person, place, and time. She appears well-developed and well-nourished.  HENT:  Head: Normocephalic and atraumatic.  Eyes: Pupils are equal, round, and reactive to light.  Neck: Normal range of motion.  Cardiovascular: Normal rate.  Respiratory: Effort normal.  Musculoskeletal:     Right knee: She exhibits decreased range of motion, swelling, effusion, abnormal alignment, bony tenderness and abnormal meniscus. Tenderness found. Medial joint line and lateral joint line tenderness noted.  Neurological: She is alert and oriented to person, place, and time.  Skin: Skin is warm.  Psychiatric: She has a normal mood and affect.    Vital signs in last 24 hours:    Labs:   Estimated body mass index is 40.02 kg/m as calculated from the following:   Height as of 12/13/18:  (1.651 m).   Weight as of 12/13/18: 109.1 kg.   Imaging Review Plain radiographs demonstrate severe degenerative joint disease of the right knee(s). The overall alignment issignificant varus. The bone quality appears to be good for age and reported activity level.      Assessment/Plan:  End stage arthritis, right knee   The patient history, physical examination, clinical judgment of the provider and imaging studies are consistent with end stage degenerative joint disease of the right knee(s) and total knee arthroplasty is deemed medically necessary. The treatment options including medical management, injection therapy arthroscopy and arthroplasty were discussed at length. The risks and benefits of total knee arthroplasty were presented and reviewed. The risks due to aseptic loosening, infection, stiffness, patella tracking problems, thromboembolic complications and other imponderables were discussed. The patient acknowledged the explanation, agreed to proceed with the plan and consent was signed. Patient is being admitted for inpatient treatment for surgery, pain control, PT, OT,  prophylactic antibiotics, VTE prophylaxis, progressive ambulation and ADL's and discharge planning. The patient is planning to be discharged home with home health services

## 2018-12-18 LAB — BASIC METABOLIC PANEL
Anion gap: 7 (ref 5–15)
BUN: 14 mg/dL (ref 8–23)
CHLORIDE: 106 mmol/L (ref 98–111)
CO2: 27 mmol/L (ref 22–32)
Calcium: 8.4 mg/dL — ABNORMAL LOW (ref 8.9–10.3)
Creatinine, Ser: 0.71 mg/dL (ref 0.44–1.00)
GFR calc Af Amer: >60 mL/min (ref >60)
GFR calc non Af Amer: >60 mL/min (ref >60)
Glucose, Bld: 106 mg/dL — ABNORMAL HIGH (ref 70–99)
Potassium: 3.6 mmol/L (ref 3.5–5.1)
Sodium: 140 mmol/L (ref 135–145)

## 2018-12-18 LAB — CBC
HCT: 36.3 % (ref 36.0–46.0)
Hemoglobin: 10.9 g/dL — ABNORMAL LOW (ref 12.0–15.0)
MCH: 28.3 pg (ref 26.0–34.0)
MCHC: 30 g/dL (ref 30.0–36.0)
MCV: 94.3 fL (ref 80.0–100.0)
NRBC: 0 % (ref 0.0–0.2)
Platelets: 166 10*3/uL (ref 150–400)
RBC: 3.85 MIL/uL — ABNORMAL LOW (ref 3.87–5.11)
RDW: 15.4 % (ref 11.5–15.5)
WBC: 8.8 10*3/uL (ref 4.0–10.5)

## 2018-12-18 NOTE — Care Management Note (Signed)
Case Management Note  Patient Details  Name: Heather [name] MRN: [id number] Date of Birth: 03-Jun-1951  Subjective/Objective:   S/p   R TKR               Action/Plan: Spoke to pt and offered choice for HH/Medicare List provided/placed on chart. Pt agreeable to [facility] for home. Pt states she has RW and 3n1 bedside commode at home. Husband will assist at home.   Expected Discharge Date:                Expected Discharge Plan:  Home w Home Health Services  In-House Referral:     Discharge planning Services  CM Consult  Post Acute Care Choice:  Home Health Choice offered to:  Patient  DME Arranged:  N/A DME Agency:  NA  HH Arranged:  PT HH Agency:  [facility]  Status of Service:  Completed, signed off  If discussed at [employer], dates discussed:    Additional Comments:  Elliot Cousin, RN 12/18/2018, 12:51 PM

## 2018-12-18 NOTE — Evaluation (Signed)
Occupational Therapy Evaluation Patient Details Name: Heather [name] MRN: [id number] DOB: [birth year] Today's Date: 12/18/2018    History of Present Illness Pt s/p R TKR and with hx fo bilat THR, DDD, a-fib, and posterior lumbar fusion.     Clinical Impression   OT education complete.  Husband will A as needed. Pt has all needed DME   Follow Up Recommendations  No OT follow up    Equipment Recommendations  None recommended by OT    Recommendations for Other Services       Precautions / Restrictions Precautions Precautions: Knee;Fall Required Braces or Orthoses: Knee Immobilizer - Right Knee Immobilizer - Right: Discontinue once straight leg raise with < 10 degree lag Restrictions Weight Bearing Restrictions: No      Mobility Bed Mobility Overal bed mobility: Needs Assistance Bed Mobility: Supine to Sit     Supine to sit: Min assist        Transfers Overall transfer level: Needs assistance Equipment used: Rolling walker (2 wheeled) Transfers: Sit to/from [employer] Sit to Stand: From elevated surface;Min guard Stand pivot transfers: Min guard       General transfer comment: cues for LE management and use of UEs to self assist    Balance Overall balance assessment: Mild deficits observed, not formally tested                                         ADL either performed or assessed with clinical judgement   ADL Overall ADL's : Needs assistance/impaired Eating/Feeding: Set up;Sitting   Grooming: Set up;Standing   Upper Body Bathing: Set up;Sitting   Lower Body Bathing: Moderate assistance;Sit to/from stand;Cueing for sequencing;Cueing for safety Lower Body Bathing Details (indicate cue type and reason): husband will A and pt has AE Upper Body Dressing : Set up;Sitting   Lower Body Dressing: Moderate assistance Lower Body Dressing Details (indicate cue type and reason): husband will A and pt has AE Toilet Transfer:  Minimal assistance;RW;Ambulation;Comfort height toilet   Toileting- Clothing Manipulation and Hygiene: Minimal assistance;Sit to/from stand;Cueing for sequencing;Cueing for safety     Tub/Shower Transfer Details (indicate cue type and reason): verbalized safety Functional mobility during ADLs: Minimal assistance;Rolling walker;Caregiver able to provide necessary level of assistance       Vision Patient Visual Report: No change from baseline       Perception     Praxis      Pertinent Vitals/Pain Pain Assessment: 0-10 Pain Score: 3  Pain Location: R knee Pain Descriptors / Indicators: Aching;Sore Pain Intervention(s): Limited activity within patient's tolerance;Repositioned;Ice applied     Hand Dominance     Extremity/Trunk Assessment Upper Extremity Assessment Upper Extremity Assessment: Overall WFL for tasks assessed           Communication Communication Communication: No difficulties   Cognition Arousal/Alertness: Awake/alert Behavior During Therapy: WFL for tasks assessed/performed Overall Cognitive Status: Within Functional Limits for tasks assessed                                                Home Living Family/patient expects to be discharged to:: Private residence Living Arrangements: Spouse/significant other Available Help at Discharge: Family Type of Home: House Home Access: Stairs to enter [employer] of Steps: 2 Entrance  Stairs-Rails: Right Home Layout: One level               Home Equipment: Walker - 2 wheels;Cane - single point;Shower seat;Bedside commode          Prior Functioning/Environment Level of Independence: Independent with assistive device(s)                          OT Goals(Current goals can be found in the care plan section) Acute Rehab OT Goals Patient Stated Goal: Regain IND OT Goal Formulation: With patient  OT Frequency:      AM-PAC OT "6 Clicks" Daily Activity      Outcome Measure Help from another person eating meals?: None Help from another person taking care of personal grooming?: None Help from another person toileting, which includes using toliet, bedpan, or urinal?: A Little Help from another person bathing (including washing, rinsing, drying)?: A Little Help from another person to put on and taking off regular upper body clothing?: None Help from another person to put on and taking off regular lower body clothing?: A Little 6 Click Score: 21   End of Session Equipment Utilized During Treatment: Rolling walker Nurse Communication: Mobility status  Activity Tolerance: Patient tolerated treatment well Patient left: in chair;with call bell/phone within reach;with family/visitor present                   Time: 5638-7564 OT Time Calculation (min): 19 min Charges:  OT General Charges $OT Visit: 1 Visit OT Evaluation $OT Eval Low Complexity: 1 Low  Lise Auer, OT Acute Rehabilitation Services [phone] Office- [phone]     Fletcher Ostermiller, Karin Golden D 12/18/2018, 6:04 PM

## 2018-12-18 NOTE — Progress Notes (Signed)
Subjective: 1 Day Post-Op Procedure(s) (LRB): RIGHT TOTAL KNEE ARTHROPLASTY (Right) Patient reports pain as moderate.  No complaints otherwise.   Objective: Vital signs in last 24 hours: Temp:  [97.4 F (36.3 C)-98.9 F (37.2 C)] 98.9 F (37.2 C) (03/07 0407) Pulse Rate:  [48-97] 97 (03/07 0522) Resp:  [10-19] 16 (03/07 0407) BP: (95-133)/(53-97) 133/63 (03/07 0407) SpO2:  [98 %-100 %] 99 % (03/07 0407)  Intake/Output from previous day: 03/06 0701 - 03/07 0700 In: 4295.6 [P.O.:550; I.V.:3140.6; IV Piggyback:605] Out: 1825 [Urine:1775; Blood:50] Intake/Output this shift: Total I/O In: 360 [P.O.:360] Out: -   Recent Labs    12/18/18 0333  HGB 10.9*   Recent Labs    12/18/18 0333  WBC 8.8  RBC 3.85*  HCT 36.3  PLT 166   Recent Labs    12/18/18 0333  NA 140  K 3.6  CL 106  CO2 27  BUN 14  CREATININE 0.71  GLUCOSE 106*  CALCIUM 8.4*   No results for input(s): LABPT, INR in the last 72 hours.  Right lower extremity: Wound well approximated with staples no active bleeding Right calf supple non tender Dorsi/planta flexion intact    Assessment/Plan: 1 Day Post-Op Procedure(s) (LRB): RIGHT TOTAL KNEE ARTHROPLASTY (Right) Up with therapy Plan discharge to home over next few days.      Heather [name] 12/18/2018, 9:03 AM

## 2018-12-18 NOTE — Evaluation (Signed)
Physical Therapy Evaluation Patient Details Name: Heather [name] MRN: [id number] DOB: October 17, 1950 Today's Date: 12/18/2018   History of Present Illness  Pt s/p R TKR and with hx fo bilat THR, DDD, a-fib, and posterior lumbar fusion.    Clinical Impression  Pt s/p R TKR and presents with decreased R LE strength/ROM and post op pain limiting functional mobility.  Pt should progress to dc home with family assist.   Follow Up Recommendations Follow surgeon's recommendation for DC plan and follow-up therapies    Equipment Recommendations  None recommended by PT    Recommendations for Other Services       Precautions / Restrictions Precautions Precautions: Knee;Fall Required Braces or Orthoses: Knee Immobilizer - Right Knee Immobilizer - Right: Discontinue once straight leg raise with < 10 degree lag Restrictions Weight Bearing Restrictions: No      Mobility  Bed Mobility Overal bed mobility: Needs Assistance Bed Mobility: Supine to Sit     Supine to sit: Min assist;Mod assist     General bed mobility comments: cues for seqeunce with assist for R LE  Transfers Overall transfer level: Needs assistance Equipment used: Rolling walker (2 wheeled) Transfers: Sit to/from Stand Sit to Stand: Min assist;From elevated surface         General transfer comment: cues for LE management and use of UEs to self assist  Ambulation/Gait Ambulation/Gait assistance: Min assist Gait Distance (Feet): 50 Feet(and 15' into bathroom) Assistive device: Rolling walker (2 wheeled) Gait Pattern/deviations: Step-to pattern;Decreased step length - right;Decreased step length - left;Shuffle;Trunk flexed Gait velocity: decr   General Gait Details: cues for sequence, posture and position from [employer]            Wheelchair Mobility    Modified Rankin (Stroke Patients Only)       Balance Overall balance assessment: Mild deficits observed, not formally tested                                            Pertinent Vitals/Pain Pain Assessment: 0-10 Pain Score: 5  Pain Location: R knee Pain Descriptors / Indicators: Aching;Sore Pain Intervention(s): Limited activity within patient's tolerance;Monitored during session;Premedicated before session;Ice applied    Home Living Family/patient expects to be discharged to:: Private residence Living Arrangements: Spouse/significant other Available Help at Discharge: Family Type of Home: House Home Access: Stairs to enter Entrance Stairs-Rails: Right Entrance Stairs-Number of Steps: 2 Home Layout: One level Home Equipment: Environmental consultant - 2 wheels;Cane - single point;Shower seat;Bedside commode      Prior Function Level of Independence: Independent with assistive device(s)               Hand Dominance        Extremity/Trunk Assessment   Upper Extremity Assessment Upper Extremity Assessment: Overall WFL for tasks assessed    Lower Extremity Assessment Lower Extremity Assessment: RLE deficits/detail RLE Deficits / Details: 2/5 quads with AAROM at knee -5 - 50       Communication   Communication: No difficulties  Cognition Arousal/Alertness: Awake/alert Behavior During Therapy: WFL for tasks assessed/performed Overall Cognitive Status: Within Functional Limits for tasks assessed                                        General Comments  Exercises Total Joint Exercises Ankle Circles/Pumps: AROM;Both;15 reps;Supine Quad Sets: AROM;10 reps;Supine;Both Heel Slides: AAROM;Right;15 reps;Supine Straight Leg Raises: AAROM;Right;10 reps;Supine   Assessment/Plan    PT Assessment Patient needs continued PT services  PT Problem List Decreased strength;Decreased range of motion;Decreased activity tolerance;Decreased mobility;Decreased knowledge of use of DME;Pain       PT Treatment Interventions DME instruction;Gait training;Stair training;Functional mobility  training;Therapeutic activities;Therapeutic exercise;Patient/family education    PT Goals (Current goals can be found in the Care Plan section)  Acute Rehab PT Goals Patient Stated Goal: Regain IND PT Goal Formulation: With patient Time For Goal Achievement: 12/25/18 Potential to Achieve Goals: Good    Frequency 7X/week   Barriers to discharge        Co-evaluation               AM-PAC PT "6 Clicks" Mobility  Outcome Measure Help needed turning from your back to your side while in a flat bed without using bedrails?: A Little Help needed moving from lying on your back to sitting on the side of a flat bed without using bedrails?: A Little Help needed moving to and from a bed to a chair (including a wheelchair)?: A Little Help needed standing up from a chair using your arms (e.g., wheelchair or bedside chair)?: A Little Help needed to walk in hospital room?: A Little Help needed climbing 3-5 steps with a railing? : A Lot 6 Click Score: 17    End of Session Equipment Utilized During Treatment: Gait belt;Oxygen Activity Tolerance: Patient tolerated treatment well;Patient limited by fatigue Patient left: in chair;with call bell/phone within reach;with family/visitor present Nurse Communication: Mobility status PT Visit Diagnosis: Difficulty in walking, not elsewhere classified (R26.2)    Time: 4944-9675 PT Time Calculation (min) (ACUTE ONLY): 32 min   Charges:   PT Evaluation $PT Eval Low Complexity: 1 Low PT Treatments $Gait Training: 8-22 mins        Mauro Kaufmann PT Acute Rehabilitation Services Pager [phone] Office [phone]   [name] 12/18/2018, 12:37 PM

## 2018-12-18 NOTE — Progress Notes (Signed)
Physical Therapy Treatment Patient Details Name: Heather [name] MRN: [id number] DOB: [birth year] Today's Date: 12/18/2018    History of Present Illness Pt s/p R TKR and with hx fo bilat THR, DDD, a-fib, and posterior lumbar fusion.      PT Comments    Pt continues cooperative but ltd by fatigue following multiple trips to/from bathroom.  Pt agreeable to perform therex program.  Follow Up Recommendations  Follow surgeon's recommendation for DC plan and follow-up therapies     Equipment Recommendations  None recommended by PT    Recommendations for Other Services       Precautions / Restrictions Precautions Precautions: Knee;Fall Required Braces or Orthoses: Knee Immobilizer - Right Knee Immobilizer - Right: Discontinue once straight leg raise with < 10 degree lag Restrictions Weight Bearing Restrictions: No    Mobility  Bed Mobility                  Transfers                    Ambulation/Gait                 Stairs             Wheelchair Mobility    Modified Rankin (Stroke Patients Only)       Balance                                            Cognition Arousal/Alertness: Awake/alert Behavior During Therapy: WFL for tasks assessed/performed Overall Cognitive Status: Within Functional Limits for tasks assessed                                        Exercises Total Joint Exercises Ankle Circles/Pumps: AROM;Both;15 reps;Supine Quad Sets: AROM;10 reps;Supine;Both Heel Slides: AAROM;Right;15 reps;Supine Straight Leg Raises: AAROM;Right;Supine;20 reps    General Comments        Pertinent Vitals/Pain Pain Assessment: 0-10 Pain Score: 5  Pain Location: R knee Pain Descriptors / Indicators: Aching;Sore Pain Intervention(s): Limited activity within patient's tolerance;Monitored during session;Premedicated before session;Ice applied    Home Living                      Prior  Function            PT Goals (current goals can now be found in the care plan section) Acute Rehab PT Goals Patient Stated Goal: Regain IND PT Goal Formulation: With patient Time For Goal Achievement: 12/25/18 Potential to Achieve Goals: Good Progress towards PT goals: Progressing toward goals    Frequency    7X/week      PT Plan Current plan remains appropriate    Co-evaluation              AM-PAC PT "6 Clicks" Mobility   Outcome Measure  Help needed turning from your back to your side while in a flat bed without using bedrails?: A Little Help needed moving from lying on your back to sitting on the side of a flat bed without using bedrails?: A Little Help needed moving to and from a bed to a chair (including a wheelchair)?: A Little Help needed standing up from a chair using your arms (e.g., wheelchair or bedside chair)?: A Little Help needed  to walk in hospital room?: A Little Help needed climbing 3-5 steps with a railing? : A Lot 6 Click Score: 17    End of Session   Activity Tolerance: Patient tolerated treatment well;Patient limited by fatigue Patient left: in bed;with call bell/phone within reach;with family/visitor present Nurse Communication: Mobility status PT Visit Diagnosis: Difficulty in walking, not elsewhere classified (R26.2)     Time: 3220-2542 PT Time Calculation (min) (ACUTE ONLY): 22 min  Charges:  $Therapeutic Exercise: 8-22 mins                     Heather [name] PT Acute Rehabilitation Services Pager [phone] Office [phone]    Heather [name] 12/18/2018, 4:09 PM

## 2018-12-18 NOTE — Plan of Care (Signed)
Pt stable overall though pain and weakness remains. No changes to note overall.

## 2018-12-19 ENCOUNTER — Other Ambulatory Visit (INDEPENDENT_AMBULATORY_CARE_PROVIDER_SITE_OTHER): Payer: Self-pay | Admitting: Physician Assistant

## 2018-12-19 MED ORDER — OXYCODONE HCL 5 MG PO TABS: 5.0000 mg | ORAL_TABLET | ORAL | 0 refills | Status: DC | PRN

## 2018-12-19 MED ORDER — METHOCARBAMOL 500 MG PO TABS: 500.0000 mg | ORAL_TABLET | Freq: Four times a day (QID) | ORAL | 1 refills | Status: DC | PRN

## 2018-12-19 NOTE — Discharge Instructions (Signed)

## 2018-12-19 NOTE — Progress Notes (Signed)
Physical Therapy Treatment Patient Details Name: Heather [name] MRN: [id number] DOB: 1951/05/10 Today's Date: 12/19/2018    History of Present Illness Pt s/p R TKR and with hx fo bilat THR, DDD, a-fib, and posterior lumbar fusion.      PT Comments    Pt progressing well with mobility and reviewed stairs and home therex program with written instruction provided.  Pt requests review of stairs with spouse once he arrives from church.   Follow Up Recommendations  Follow surgeon's recommendation for DC plan and follow-up therapies     Equipment Recommendations  None recommended by PT    Recommendations for Other Services       Precautions / Restrictions Precautions Precautions: Knee;Fall Required Braces or Orthoses: Knee Immobilizer - Right Knee Immobilizer - Right: Discontinue once straight leg raise with < 10 degree lag Restrictions Weight Bearing Restrictions: No Other Position/Activity Restrictions: WBAT    Mobility  Bed Mobility Overal bed mobility: Needs Assistance Bed Mobility: Supine to Sit     Supine to sit: Min assist     General bed mobility comments: min assist for R LE  Transfers Overall transfer level: Needs assistance Equipment used: Rolling walker (2 wheeled) Transfers: Sit to/from Stand Sit to Stand: Supervision         General transfer comment: cues for LE management and use of UEs to self assist; stand from EOB, to/from [facility], and to recliner  Ambulation/Gait Ambulation/Gait assistance: Min guard Gait Distance (Feet): 150 Feet(and 15' back from bathroom) Assistive device: Rolling walker (2 wheeled) Gait Pattern/deviations: Step-to pattern;Decreased step length - right;Decreased step length - left;Shuffle;Trunk flexed Gait velocity: decr   General Gait Details: cues for sequence, posture and position from RW   Stairs Stairs: Yes Stairs assistance: Min assist Stair Management: No rails;Step to pattern;Backwards;With walker Number of  Stairs: 4 General stair comments: 2 steps twice with cues for sequence   Wheelchair Mobility    Modified Rankin (Stroke Patients Only)       Balance Overall balance assessment: Mild deficits observed, not formally tested                                          Cognition Arousal/Alertness: Awake/alert Behavior During Therapy: WFL for tasks assessed/performed Overall Cognitive Status: Within Functional Limits for tasks assessed                                        Exercises Total Joint Exercises Ankle Circles/Pumps: AROM;Both;15 reps;Supine Quad Sets: AROM;10 reps;Supine;Both Heel Slides: AAROM;Right;Supine;20 reps Straight Leg Raises: AAROM;Right;Supine;20 reps Goniometric ROM: AAROM R knee -5 - 65    General Comments        Pertinent Vitals/Pain Pain Assessment: 0-10 Pain Score: 4  Pain Location: R knee Pain Descriptors / Indicators: Aching;Sore Pain Intervention(s): Limited activity within patient's tolerance;Monitored during session;Premedicated before session;Ice applied    Home Living                      Prior Function            PT Goals (current goals can now be found in the care plan section) Acute Rehab PT Goals Patient Stated Goal: Regain IND PT Goal Formulation: With patient Time For Goal Achievement: 12/25/18 Potential to Achieve Goals: Good Progress  towards PT goals: Progressing toward goals    Frequency    7X/week      PT Plan Current plan remains appropriate    Co-evaluation              AM-PAC PT "6 Clicks" Mobility   Outcome Measure  Help needed turning from your back to your side while in a flat bed without using bedrails?: A Little Help needed moving from lying on your back to sitting on the side of a flat bed without using bedrails?: A Little Help needed moving to and from a bed to a chair (including a wheelchair)?: A Little Help needed standing up from a chair using your  arms (e.g., wheelchair or bedside chair)?: A Little Help needed to walk in hospital room?: A Little Help needed climbing 3-5 steps with a railing? : A Little 6 Click Score: 18    End of Session Equipment Utilized During Treatment: Gait belt;Oxygen Activity Tolerance: Patient tolerated treatment well Patient left: in chair;with call bell/phone within reach Nurse Communication: Mobility status PT Visit Diagnosis: Difficulty in walking, not elsewhere classified (R26.2)     Time: 0920-1010 PT Time Calculation (min) (ACUTE ONLY): 50 min  Charges:  $Gait Training: 8-22 mins $Therapeutic Exercise: 8-22 mins $Therapeutic Activity: 8-22 mins                     Mauro Kaufmann PT Acute Rehabilitation Services Pager [phone] Office [phone]    Jecenia Leamer 12/19/2018, 12:07 PM

## 2018-12-19 NOTE — Progress Notes (Signed)
Physical Therapy Treatment Patient Details Name: Heather [name] MRN: [id number] DOB: [birth year] Today's Date: 12/19/2018    History of Present Illness Pt s/p R TKR and with hx fo bilat THR, DDD, a-fib, and posterior lumbar fusion.      PT Comments    Pt ambulated short distance and negotiated stairs with min assist and spouse present to review.   Follow Up Recommendations  Follow surgeon's recommendation for DC plan and follow-up therapies     Equipment Recommendations  None recommended by PT    Recommendations for Other Services       Precautions / Restrictions Precautions Precautions: Knee;Fall Required Braces or Orthoses: Knee Immobilizer - Right Knee Immobilizer - Right: Discontinue once straight leg raise with < 10 degree lag Restrictions Weight Bearing Restrictions: No Other Position/Activity Restrictions: WBAT    Mobility  Bed Mobility Overal bed mobility: Needs Assistance Bed Mobility: Supine to Sit     Supine to sit: Min assist     General bed mobility comments: min assist for R LE - spouse assisting  Transfers Overall transfer level: Needs assistance Equipment used: Rolling walker (2 wheeled) Transfers: Sit to/from Stand Sit to Stand: Supervision         General transfer comment: cues for LE management and use of UEs to self assist; stand from EOB, to/from [facility], and to recliner  Ambulation/Gait Ambulation/Gait assistance: Min guard;Supervision Gait Distance (Feet): 45 Feet Assistive device: Rolling walker (2 wheeled) Gait Pattern/deviations: Step-to pattern;Decreased step length - right;Decreased step length - left;Shuffle;Trunk flexed Gait velocity: decr   General Gait Details: cues for sequence, posture and position from RW   Stairs Stairs: Yes Stairs assistance: Min assist Stair Management: No rails;Step to pattern;Backwards;With walker Number of Stairs: 2 General stair comments: cues for sequence, spouse present to observe, written  instruction provided   Wheelchair Mobility    Modified Rankin (Stroke Patients Only)       Balance Overall balance assessment: Mild deficits observed, not formally tested                                          Cognition Arousal/Alertness: Awake/alert Behavior During Therapy: WFL for tasks assessed/performed Overall Cognitive Status: Within Functional Limits for tasks assessed                                        Exercises Total Joint Exercises Ankle Circles/Pumps: AROM;Both;15 reps;Supine Quad Sets: AROM;10 reps;Supine;Both Heel Slides: AAROM;Right;Supine;20 reps Straight Leg Raises: AAROM;Right;Supine;20 reps Goniometric ROM: AAROM R knee -5 - 65    General Comments        Pertinent Vitals/Pain Pain Assessment: 0-10 Pain Score: 6  Pain Location: R knee Pain Descriptors / Indicators: Aching;Sore Pain Intervention(s): Limited activity within patient's tolerance;Monitored during session;Premedicated before session;Patient requesting pain meds-RN notified    Home Living                      Prior Function            PT Goals (current goals can now be found in the care plan section) Acute Rehab PT Goals Patient Stated Goal: Regain IND PT Goal Formulation: With patient Time For Goal Achievement: 12/25/18 Potential to Achieve Goals: Good Progress towards PT goals: Progressing toward goals  Frequency    7X/week      PT Plan Current plan remains appropriate    Co-evaluation              AM-PAC PT "6 Clicks" Mobility   Outcome Measure  Help needed turning from your back to your side while in a flat bed without using bedrails?: A Little Help needed moving from lying on your back to sitting on the side of a flat bed without using bedrails?: A Little Help needed moving to and from a bed to a chair (including a wheelchair)?: A Little Help needed standing up from a chair using your arms (e.g., wheelchair  or bedside chair)?: A Little Help needed to walk in hospital room?: A Little Help needed climbing 3-5 steps with a railing? : A Little 6 Click Score: 18    End of Session Equipment Utilized During Treatment: Gait belt;Oxygen Activity Tolerance: Patient tolerated treatment well Patient left: Other (comment)(EOB sitting) Nurse Communication: Mobility status PT Visit Diagnosis: Difficulty in walking, not elsewhere classified (R26.2)     Time: 7124-5809 PT Time Calculation (min) (ACUTE ONLY): 16 min  Charges:  $Gait Training: 8-22 mins $Therapeutic Exercise: 8-22 mins $Therapeutic Activity: 8-22 mins                     Mauro Kaufmann PT Acute Rehabilitation Services Pager [phone] Office [phone]    Daqwan Dougal 12/19/2018, 12:12 PM

## 2018-12-19 NOTE — Progress Notes (Signed)
Subjective: 2 Days Post-Op Procedure(s) (LRB): RIGHT TOTAL KNEE ARTHROPLASTY (Right) Patient reports pain as moderate.  Doing well wants to work with PT and possibly discharge to home later today.   Objective: Vital signs in last 24 hours: Temp:  [97.8 F (36.6 C)-99.5 F (37.5 C)] 99 F (37.2 C) (03/08 0500) Pulse Rate:  [69-79] 79 (03/08 0500) Resp:  [14-18] 16 (03/08 0500) BP: (93-129)/(67-85) 129/68 (03/08 0500) SpO2:  [83 %-98 %] 97 % (03/08 0500)  Intake/Output from previous day: 03/07 0701 - 03/08 0700 In: 1740.7 [P.O.:1560; I.V.:180.7] Out: 250 [Urine:250] Intake/Output this shift: Total I/O In: 360 [P.O.:360] Out: -   Recent Labs    12/18/18 0333  HGB 10.9*   Recent Labs    12/18/18 0333  WBC 8.8  RBC 3.85*  HCT 36.3  PLT 166   Recent Labs    12/18/18 0333  NA 140  K 3.6  CL 106  CO2 27  BUN 14  CREATININE 0.71  GLUCOSE 106*  CALCIUM 8.4*   No results for input(s): LABPT, INR in the last 72 hours.  Right lower extremity: Dorsiflexion/Plantar flexion intact Incision: dressing C/D/I Compartment soft   Assessment/Plan: 2 Days Post-Op Procedure(s) (LRB): RIGHT TOTAL KNEE ARTHROPLASTY (Right) Up with therapy Discharge home with home health later today if does well with PT.       Heather [name] 12/19/2018, 9:19 AM

## 2018-12-19 NOTE — Plan of Care (Signed)
Pt to discharge home today. No needs at this time. Family at bedside.

## 2018-12-19 NOTE — Progress Notes (Signed)
Pt stable to d/c home with family. No needs at this time. No questions or concerns on d/c instructions and education given. Pt has all home equipment needed.

## 2018-12-19 NOTE — Discharge Summary (Signed)
Patient ID: Heather [name] MRN: [id number] DOB/AGE: 1951-01-28 68 y.o.  Admit date: 12/17/2018 Discharge date: 12/19/2018  Admission Diagnoses:  Principal Problem:   Unilateral primary osteoarthritis, right knee Active Problems:   Status post total right knee replacement   Discharge Diagnoses:  Same  Past Medical History:  Diagnosis Date  . Anemia    "onc;e; had to take iron for awhile"  . Atrial fibrillation (HCC)    Began in 2004.  Had ablations at [facility] in 2005 and 2007.  She is off coumadin  now with no noted recurrent atrial fibrillation since 2007. til 04/01/12; s/p initiation of Rhythmol with DCCV June 2013; reports 05-18-18 that she is in aflutter   . Carpal tunnel syndrome   . Degenerative disk disease   . GERD (gastroesophageal reflux disease)    resolved after lap band  . History of blood transfusion    "w/both hip replacements"  . HTN (hypertension)   . Hypothyroidism   . IBS (irritable bowel syndrome)   . Interstitial cystitis   . Morton's neuroma    right foot  . Obesity   . On home oxygen therapy    2L continuous daytime; at night uses 3L with CPAP at nighttime   . Pneumonitis dx 1 year ago   cryptogenic organizing pneumonitis; treated with prednisone   . Pulmonary hypertension (HCC)   . Shortness of breath    only without oxygen  . Sleep apnea 04/01/12   "dx'd just last week"  . Sleep apnea    uses CPAP, 11 CWP with residual AHI 6.3    Surgeries: Procedure(s): RIGHT TOTAL KNEE ARTHROPLASTY on 12/17/2018   Consultants:   Discharged Condition: Improved  Hospital Course: Heather [name] is an 68 y.o. female who was admitted 12/17/2018 for operative treatment ofUnilateral primary osteoarthritis, right knee. Patient has severe unremitting pain that affects sleep, daily activities, and work/hobbies. After pre-op clearance the patient was taken to the operating room on 12/17/2018 and underwent  Procedure(s): RIGHT TOTAL KNEE ARTHROPLASTY.    Patient was  given perioperative antibiotics:  Anti-infectives (From admission, onward)   Start     Dose/Rate Route Frequency Ordered Stop   12/17/18 1800  clindamycin (CLEOCIN) IVPB 600 mg     600 mg 100 mL/hr over 30 Minutes Intravenous Every 6 hours 12/17/18 1437 12/18/18 0103   12/17/18 0730  clindamycin (CLEOCIN) IVPB 900 mg     900 mg 100 mL/hr over 30 Minutes Intravenous On call to O.R. 12/17/18 0724 12/17/18 1056       Patient was given sequential compression devices, early ambulation, and chemoprophylaxis to prevent DVT.  Patient benefited maximally from hospital stay and there were no complications.    Recent vital signs:  Patient Vitals for the past 24 hrs:  BP Temp Temp src Pulse Resp SpO2  12/19/18 0500 129/68 99 F (37.2 C) Oral 79 16 97 %  12/18/18 2211 93/76 97.8 F (36.6 C) Oral 76 14 98 %  12/18/18 2130 - - - - 18 -  12/18/18 1432 115/85 98 F (36.7 C) - 69 16 97 %  12/18/18 1009 119/67 99.5 F (37.5 C) Oral 70 14 (!) 83 %     Recent laboratory studies:  Recent Labs    12/18/18 0333  WBC 8.8  HGB 10.9*  HCT 36.3  PLT 166  NA 140  K 3.6  CL 106  CO2 27  BUN 14  CREATININE 0.71  GLUCOSE 106*  CALCIUM 8.4*  Discharge Medications:   Allergies as of 12/19/2018      Reactions   Cephalexin Anaphylaxis, Hives   "throat started closing up"   Rocephin [ceftriaxone Sodium In Dextrose] Anaphylaxis   Penicillins Rash   Has patient had a PCN reaction causing immediate rash, facial/tongue/throat swelling, SOB or lightheadedness with hypotension: no Has patient had a PCN reaction causing severe rash involving mucus membranes or skin necrosis: no Has patient had a PCN reaction that required hospitalization no Has patient had a PCN reaction occurring within the last 10 years: no If all of the above answers are "NO", then may proceed with Cephalosporin use.   Flecainide Acetate Other (See Comments)   Jittery    Moxifloxacin    Does not remember the reaction    Sulfonamide Derivatives    Does not remember reaction ; "was so young when I had reaction to it"      Medication List    STOP taking these medications   traMADol 50 MG tablet Commonly known as:  ULTRAM     TAKE these medications   albuterol 108 (90 Base) MCG/ACT inhaler Commonly known as:  PROVENTIL HFA;VENTOLIN HFA INHALE 2 PUFFS INTO THE LUNGS EVERY 4 HOURS AS NEEDED FOR WHEEZING/SHORTNESS OF BREATH What changed:  See the new instructions.   amitriptyline 10 MG tablet Commonly known as:  ELAVIL TAKE 1 TABLET (10 MG TOTAL) BY MOUTH AT BEDTIME.   BARIATRIC MULTIVITAMINS/IRON PO Take 1 tablet by mouth daily.   cetirizine 10 MG tablet Commonly known as:  ZYRTEC Take 10 mg by mouth at bedtime.   clonazePAM 0.5 MG tablet Commonly known as:  KLONOPIN TAKE 1/2 TO 1 TABLET BY MOUTH TWICE A DAY What changed:    how much to take  how to take this  when to take this   diclofenac sodium 1 % Gel Commonly known as:  Voltaren Apply 4 g topically 4 (four) times daily. What changed:    how much to take  when to take this   diltiazem 120 MG 24 hr capsule Commonly known as:  CARDIZEM CD Take 120 mg by mouth daily.   dronedarone 400 MG tablet Commonly known as:  MULTAQ Take 400 mg by mouth 2 (two) times daily with a meal.   Eliquis 5 MG Tabs tablet Generic drug:  apixaban TAKE 1 TABLET BY MOUTH TWICE A DAY....NEED OFFICE VISIT BEFORE ANY MORE REFILLS What changed:  See the new instructions.   furosemide 40 MG tablet Commonly known as:  LASIX TAKE 2 TABLETS BY MOUTH IN THE MORNING What changed:  See the new instructions.   guaiFENesin 600 MG 12 hr tablet Commonly known as:  MUCINEX Take 600 mg by mouth 2 (two) times daily as needed (allergies).   levothyroxine 75 MCG tablet Commonly known as:  SYNTHROID, LEVOTHROID Take 1 tablet (75 mcg total) by mouth daily before breakfast.   losartan 100 MG tablet Commonly known as:  COZAAR TAKE 1 TABLET BY MOUTH  DAILY    LUBRICATING EYE DROPS OP Place 1 drop into both eyes daily as needed (dry eyes).   Magnesium 250 MG Tabs Take 250 mg by mouth 2 (two) times daily.   methocarbamol 500 MG tablet Commonly known as:  ROBAXIN Take 1 tablet (500 mg total) by mouth every 6 (six) hours as needed for muscle spasms.   metoprolol tartrate 25 MG tablet Commonly known as:  LOPRESSOR Take 25 mg by mouth 2 (two) times daily.   montelukast 10 MG  tablet Commonly known as:  SINGULAIR TAKE 1 TABLET BY MOUTH EVERYDAY AT BEDTIME What changed:  See the new instructions.   oxyCODONE 5 MG immediate release tablet Commonly known as:  Oxy IR/ROXICODONE Take 1-2 tablets (5-10 mg total) by mouth every 4 (four) hours as needed for moderate pain (pain score 4-6).   OXYGEN Inhale 2 L into the lungs continuous.   predniSONE 5 MG tablet Commonly known as:  DELTASONE Take 0.5 tablets (2.5 mg total) by mouth daily with breakfast.   vitamin C 500 MG tablet Commonly known as:  ASCORBIC ACID Take 500 mg by mouth daily.   Vitamin D 50 MCG (2000 UT) tablet Take 2,000 Units by mouth daily.            Durable Medical Equipment  (From admission, onward)         Start     Ordered   12/17/18 1438  DME Walker rolling  Once    Question:  Patient needs a walker to treat with the following condition  Answer:  Status post total right knee replacement   12/17/18 1437   12/17/18 1438  DME 3 n 1  Once     12/17/18 1437          Diagnostic Studies: Dg Knee Right Port  Result Date: 12/17/2018 CLINICAL DATA:  Post RIGHT total knee arthroplasty EXAM: PORTABLE RIGHT KNEE - 1-2 VIEW COMPARISON:  Portable exam 1259 hours compared to 11/24/2018 FINDINGS: Osseous demineralization. New components of a RIGHT knee prosthesis are identified in expected positions. No acute fracture, dislocation, bone destruction or periprosthetic lucency. Anterior skin clips and postsurgical soft tissue changes. IMPRESSION: RIGHT knee prosthesis without  acute complication. Electronically Signed   By: Ulyses Southward M.D.   On: 12/17/2018 14:01   Xr Knee 1-2 Views Right  Result Date: 11/24/2018 2 views of the right knee show end-stage arthritis.  There is actually subluxation of the knee joint itself.  There is complete loss of the compartments in terms of joint space in all 3 compartments.  There are large osteophytes in all 3 compartments.  There is varus malalignment.   Disposition:     Follow-up Information    [facility] Follow up.   Specialty:  Home Health Services Why:  Home Health Physical Therapy-agency will call to arrange initial visit Contact information: [address] [place] [address] [place] [address] [phone]        Kathryne Hitch, MD. Schedule an appointment as soon as possible for a visit in 2 week(s).   Specialty:  Orthopedic Surgery Contact information: [address] [place] [address] [phone]            Signed: Richardean Canal 12/19/2018, 9:26 AM

## 2018-12-21 ENCOUNTER — Telehealth (INDEPENDENT_AMBULATORY_CARE_PROVIDER_SITE_OTHER): Payer: Self-pay | Admitting: Orthopaedic Surgery

## 2018-12-21 ENCOUNTER — Encounter (HOSPITAL_COMMUNITY): Payer: Self-pay | Admitting: Orthopaedic Surgery

## 2018-12-21 ENCOUNTER — Ambulatory Visit: Payer: Self-pay | Admitting: Pulmonary Disease

## 2018-12-21 MED ORDER — OXYCODONE HCL 5 MG PO TABS: 5.0000 mg | ORAL_TABLET | ORAL | 0 refills | Status: DC | PRN

## 2018-12-21 NOTE — Telephone Encounter (Signed)
Please advise 

## 2018-12-21 NOTE — Telephone Encounter (Signed)
Patient called needing Rx refilled (Oxycodone) The number to contact patient is [phone] °

## 2018-12-22 ENCOUNTER — Telehealth (INDEPENDENT_AMBULATORY_CARE_PROVIDER_SITE_OTHER): Payer: Self-pay

## 2018-12-22 NOTE — Telephone Encounter (Signed)
Patient advised.

## 2018-12-22 NOTE — Telephone Encounter (Signed)
I sent some in yesterday 

## 2018-12-22 NOTE — Telephone Encounter (Signed)
Patient called stating that she will be out of her oxycodone today. States she called yesterday but never heard anything. Asking for it to be sent to [facility] on [address]

## 2018-12-24 ENCOUNTER — Telehealth (INDEPENDENT_AMBULATORY_CARE_PROVIDER_SITE_OTHER): Payer: Self-pay | Admitting: Orthopaedic Surgery

## 2018-12-24 MED ORDER — OXYCODONE HCL 5 MG PO TABS: 5.0000 mg | ORAL_TABLET | ORAL | 0 refills | Status: DC | PRN

## 2018-12-24 NOTE — Telephone Encounter (Signed)
Please advise 

## 2018-12-24 NOTE — Telephone Encounter (Signed)
Patient called requesting an RX refill on her Oxycodone.  Patient thinks she has enough for the weekend, but is not sure.  Patient uses [facility] on [address]  [phone].  Thank you.

## 2018-12-24 NOTE — Telephone Encounter (Signed)
I'll send in some more. 

## 2018-12-29 ENCOUNTER — Other Ambulatory Visit (INDEPENDENT_AMBULATORY_CARE_PROVIDER_SITE_OTHER): Payer: Self-pay | Admitting: Physician Assistant

## 2018-12-29 ENCOUNTER — Telehealth (INDEPENDENT_AMBULATORY_CARE_PROVIDER_SITE_OTHER): Payer: Self-pay | Admitting: Orthopaedic Surgery

## 2018-12-29 MED ORDER — OXYCODONE HCL 5 MG PO TABS: 5.0000 mg | ORAL_TABLET | ORAL | 0 refills | Status: DC | PRN

## 2018-12-29 NOTE — Telephone Encounter (Signed)
done

## 2018-12-29 NOTE — Telephone Encounter (Signed)
Patient called needing Rx refilled (Oxycodone) The number to contact patient is [phone]

## 2018-12-29 NOTE — Telephone Encounter (Signed)
Please advise 

## 2018-12-30 ENCOUNTER — Telehealth (INDEPENDENT_AMBULATORY_CARE_PROVIDER_SITE_OTHER): Payer: Self-pay | Admitting: Orthopaedic Surgery

## 2018-12-30 NOTE — Telephone Encounter (Signed)
Patient called asked if an order can be faxed to [facility] (PT) in [place] so she can start her (PT)   The phone number is [phone]   Patient said she do not have there fax number. The number to contact patient is [phone]

## 2018-12-31 ENCOUNTER — Telehealth (INDEPENDENT_AMBULATORY_CARE_PROVIDER_SITE_OTHER): Payer: Self-pay

## 2018-12-31 NOTE — Telephone Encounter (Signed)
Called patient and asked the screening questions.  Do you have now or have you had in the past 7 days a fever and/or chills? NO  Do you have now or have you had in the past 7 days a cough? NO  Do you have now or have you had in the last 7 days nausea, vomiting or abdominal pain? NO  Have you been exposed to anyone who has tested positive for COVID-19? NO  Have you or anyone who lives with you traveled within the last month? NO 

## 2019-01-03 ENCOUNTER — Ambulatory Visit (INDEPENDENT_AMBULATORY_CARE_PROVIDER_SITE_OTHER): Payer: Medicare Other | Admitting: Orthopaedic Surgery

## 2019-01-03 ENCOUNTER — Encounter (INDEPENDENT_AMBULATORY_CARE_PROVIDER_SITE_OTHER): Payer: Self-pay | Admitting: Orthopaedic Surgery

## 2019-01-03 ENCOUNTER — Other Ambulatory Visit: Payer: Self-pay

## 2019-01-03 DIAGNOSIS — Z96651 Presence of right artificial knee joint: Secondary | ICD-10-CM

## 2019-01-03 MED ORDER — HYDROCODONE-ACETAMINOPHEN 5-325 MG PO TABS: 1.0000 | ORAL_TABLET | Freq: Four times a day (QID) | ORAL | 0 refills | Status: DC | PRN

## 2019-01-03 NOTE — Telephone Encounter (Signed)
Patients HHPT has been extended instead

## 2019-01-03 NOTE — Progress Notes (Signed)
HPI: [name] returns today 2-week status post right total knee arthroplasty.  She is overall doing well.  Ambulating with a walker.  She is on Eliquis.  Is been taking oxycodone for pain stating that this is making her too sleepy is wanting something less strong.  Denies any chest pain shortness breath fevers chills  Physical exam: Right knee surgical incisions well approximated with staples.  Calf supple nontender.  She has full extension and flexion to 90 degrees.  Impression: Status post right total knee arthroplasty 2 weeks  Plan: She will continue to work on range of motion of the knee.  She would like to continue home physical therapy due to her chronic oxygen use in her home.  Was given a prescription for hydrocodone.  Follow-up with [place] in 1 month sooner if there is any questions concerns.  Staples removed Steri-Strips applied.  Questions encouraged and answered.

## 2019-01-04 ENCOUNTER — Telehealth: Payer: Self-pay | Admitting: Nurse Practitioner

## 2019-01-05 ENCOUNTER — Ambulatory Visit: Payer: Self-pay | Admitting: Pulmonary Disease

## 2019-01-05 ENCOUNTER — Telehealth (INDEPENDENT_AMBULATORY_CARE_PROVIDER_SITE_OTHER): Payer: Medicare Other | Admitting: Nurse Practitioner

## 2019-01-05 ENCOUNTER — Other Ambulatory Visit: Payer: Self-pay

## 2019-01-05 DIAGNOSIS — J849 Interstitial pulmonary disease, unspecified: Secondary | ICD-10-CM

## 2019-01-05 DIAGNOSIS — G4733 Obstructive sleep apnea (adult) (pediatric): Secondary | ICD-10-CM

## 2019-01-05 DIAGNOSIS — I272 Pulmonary hypertension, unspecified: Secondary | ICD-10-CM | POA: Diagnosis not present

## 2019-01-05 NOTE — Progress Notes (Signed)
I have reviewed and agree with assessment/plan.  Caden Fukushima, MD [facility] 01/05/2019, 12:19 PM   

## 2019-01-05 NOTE — Patient Instructions (Signed)
ILD: - will decrease prednisone to 2.5 mg Monday, Wednesday, Friday - will reassess status in few weeks - will order repeat HRCT and PFT - might need further assessment with serology and lung tissue sampling at some point - might also need rheumatology assessment   OSA Patient continues to benefit from CPAP with good compliance and control documented Continue CPAP at current settings Continue current medications Goal of 4 hours or more usage per night Maintain healthy weight Do not drive if drowsy   Chronic respiratory failure with hypoxia. - she will need to continue using oxygen with exertion and at night   WHO group 2 and 3 pulmonary hypertension. - will need to repeat Echo sometime in the near future and then determine is she needs further assessment for pulmonary hypertension  Follow up with Dr. Craige Cotta in 4-5 months with PFT the same day  Televisit with NP in 8 weeks

## 2019-01-05 NOTE — Progress Notes (Signed)
Virtual Visit via Telephone Note  I connected with Heather [name] on 01/05/19 at 11:00 AM EDT by telephone and verified that I am speaking with the correct person using two identifiers.   I discussed the limitations, risks, security and privacy concerns of performing an evaluation and management service by telephone and the availability of in person appointments. I also discussed with the patient that there may be a patient responsible charge related to this service. The patient expressed understanding and agreed to proceed.   History of Present Illness: 68 year old female with OSA and restrictive lung disease who is followed by Dr. Craige Cotta.   Patient is a tele-visit today for follow-up on OSA and ILD.  At her last visit with Dr. Craige Cotta on 11/15/2018 her prednisone was decreased to 2.5 mg daily and patient states that she has been doing well with this. She does not have any breathing issues at this time.  She is hoping to be able to completely wean off of the prednisone.  She states that she has been doing well with her CPAP but feels that the pressure may be a little too high.  She is compliant with her CPAP and tries to wear it nightly.  She feels much less drowsy during the day after using her CPAP. Denies f/c/s, n/v/d, hemoptysis, PND, leg swelling.   Observations/Objective: HST 11/25/18 - AHI:23.7, SpO2 low 80% PSG 03/25/12 >> AHI 24.7, SpO2 low 76%   Echo 01/06/18 >> EF 60 to 65%, mod TR, PAS 76 mmHg  Assessment and Plan: ILD: stable - will decrease prednisone to 2.5 mg Monday, Wednesday, Friday - will reassess status in few weeks - will order repeat HRCT and PFT - might need further assessment with serology and lung tissue sampling at some point - might also need rheumatology assessment   OSA: stable Patient continues to benefit from CPAP with good compliance and control documented Continue CPAP at current settings Continue current medications Goal of 4 hours or more usage per  night Maintain healthy weight Do not drive if drowsy   Chronic respiratory failure with hypoxia. - she will need to continue using oxygen with exertion and at night   WHO group 2 and 3 pulmonary hypertension. - will need to repeat Echo sometime in the near future and then determine is she needs further assessment for pulmonary hypertension   Follow Up Instructions:  Follow up with Dr. Craige Cotta in 4-5 months with PFT the same day  Televisit with NP in 8 weeks    I discussed the assessment and treatment plan with the patient. The patient was provided an opportunity to ask questions and all were answered. The patient agreed with the plan and demonstrated an understanding of the instructions.   The patient was advised to call back or seek an in-person evaluation if the symptoms worsen or if the condition fails to improve as anticipated.  I provided 23 minutes of non-face-to-face time during this encounter.   Ivonne Andrew, NP

## 2019-01-05 NOTE — Assessment & Plan Note (Signed)
-   will need to repeat Echo sometime in the near future and then determine is she needs further assessment for pulmonary hypertension

## 2019-01-05 NOTE — Assessment & Plan Note (Signed)
Patient is a tele-visit today for follow-up on OSA and ILD.  At her last visit with Dr. Craige Cotta on 11/15/2018 her prednisone was decreased to 2.5 mg daily and patient states that she has been doing well with this. She does not have any breathing issues at this time.  She is hoping to be able to completely wean off of the prednisone.  She states that she has been doing well with her CPAP but feels that the pressure may be a little too high.  She is compliant with her CPAP and tries to wear it nightly.  She feels much less drowsy during the day after using her CPAP.

## 2019-01-10 ENCOUNTER — Telehealth (INDEPENDENT_AMBULATORY_CARE_PROVIDER_SITE_OTHER): Payer: Self-pay | Admitting: Orthopaedic Surgery

## 2019-01-10 ENCOUNTER — Inpatient Hospital Stay (INDEPENDENT_AMBULATORY_CARE_PROVIDER_SITE_OTHER): Payer: Medicare Other | Admitting: Orthopaedic Surgery

## 2019-01-10 MED ORDER — METHOCARBAMOL 500 MG PO TABS: 500.0000 mg | ORAL_TABLET | Freq: Four times a day (QID) | ORAL | 1 refills | Status: DC | PRN

## 2019-01-10 MED ORDER — HYDROCODONE-ACETAMINOPHEN 5-325 MG PO TABS: 1.0000 | ORAL_TABLET | Freq: Four times a day (QID) | ORAL | 0 refills | Status: DC | PRN

## 2019-01-10 NOTE — Telephone Encounter (Signed)
I sent some in 

## 2019-01-10 NOTE — Telephone Encounter (Signed)
Please advise 

## 2019-01-10 NOTE — Telephone Encounter (Signed)
Patient called needing Rx refilled  (Hydrocodone and Robaxin) The number to contact patient is [phone]

## 2019-01-18 ENCOUNTER — Telehealth: Payer: Self-pay | Admitting: Pulmonary Disease

## 2019-01-18 NOTE — Telephone Encounter (Signed)
Patient states that after decreasing prednisone to three days a week she has had increased SOB. Patient would like to go back to Taking Prednisone  Daily.  TN please advise

## 2019-01-19 NOTE — Telephone Encounter (Signed)
Called and spoke with pt stating to her that she needs to check with PCP in regards to a refill of clonazepam. Pt expressed understanding. Nothing further needed.

## 2019-01-19 NOTE — Telephone Encounter (Signed)
OK that would be fine. Thanks.

## 2019-01-19 NOTE — Telephone Encounter (Signed)
Called and spoke with pt letting her know that Archie Patten said it is fine for her to go back to taking prednisone daily. Pt expressed understanding. Asked pt if she needed a refill on her prednisone and she stated she was fine with that but stated she does need a refill of her clonazepam. Med last filled for pt 06/22/18 by Dr. Kriste Basque, #60 tabs with 3 RF.  Tonya, please advise if it is okay for [place] to refill pt's clonazepam. Thanks!

## 2019-01-19 NOTE — Telephone Encounter (Signed)
That should be refilled through her PCP. Thanks.

## 2019-01-31 ENCOUNTER — Encounter (INDEPENDENT_AMBULATORY_CARE_PROVIDER_SITE_OTHER): Payer: Self-pay | Admitting: Orthopaedic Surgery

## 2019-01-31 ENCOUNTER — Ambulatory Visit (INDEPENDENT_AMBULATORY_CARE_PROVIDER_SITE_OTHER): Payer: Medicare Other | Admitting: Orthopaedic Surgery

## 2019-01-31 ENCOUNTER — Other Ambulatory Visit: Payer: Self-pay | Admitting: Family Medicine

## 2019-01-31 ENCOUNTER — Other Ambulatory Visit: Payer: Self-pay

## 2019-01-31 DIAGNOSIS — Z96651 Presence of right artificial knee joint: Secondary | ICD-10-CM

## 2019-01-31 MED ORDER — AMITRIPTYLINE HCL 10 MG PO TABS: 10.0000 mg | ORAL_TABLET | Freq: Every day | ORAL | 0 refills | Status: DC

## 2019-01-31 MED ORDER — METHOCARBAMOL 500 MG PO TABS: 500.0000 mg | ORAL_TABLET | Freq: Four times a day (QID) | ORAL | 1 refills | Status: DC | PRN

## 2019-01-31 NOTE — Progress Notes (Signed)
HPI: [name] returns today status post right total knee arthroplasty 45 days.  States he still having no pain particularly at night.  She states she is having to take some NSAIDs which she is not supposed to take due to her being on a blood thinner and also history of gastric bypass.  She really does not want to be on narcotics for any prolonged period of time she is taking hydrocodone mainly at night and trying to take Tylenol during the day which she states just is not helping.  Physical exam: Right knee full extension flexion to approximately 100 510 degrees.  Surgical incisions healing well no signs of infection calf supple nontender.  Impression: Status post right total knee arthroplasty 12/17/2018  Plan: She has tramadol at home we will have her take this to see if this helps with her pain along with Tylenol.  Line.:  Robaxin for her.  She finds that the tramadol is not strong enough that she can call and we will send in a prescription for Norco.  Otherwise she will follow-up with [place] in 1 month.  Continue to work on scar tissue mobilization.  Questions encouraged and answered at length.

## 2019-02-02 ENCOUNTER — Telehealth (INDEPENDENT_AMBULATORY_CARE_PROVIDER_SITE_OTHER): Payer: Self-pay | Admitting: Physician Assistant

## 2019-02-02 MED ORDER — TRAMADOL HCL 50 MG PO TABS: 50.0000 mg | ORAL_TABLET | Freq: Four times a day (QID) | ORAL | 0 refills | Status: DC | PRN

## 2019-02-02 NOTE — Telephone Encounter (Signed)
Please advise 

## 2019-02-02 NOTE — Telephone Encounter (Signed)
Rx Tramadol [facility] @ [address]

## 2019-02-11 ENCOUNTER — Other Ambulatory Visit: Payer: Self-pay

## 2019-02-11 ENCOUNTER — Ambulatory Visit (INDEPENDENT_AMBULATORY_CARE_PROVIDER_SITE_OTHER): Payer: Medicare Other | Admitting: Family Medicine

## 2019-02-11 VITALS — BP 120/68 | HR 75 | Temp 98.3°F | Resp 18 | Ht 65.5 in | Wt 238.0 lb

## 2019-02-11 DIAGNOSIS — R3989 Other symptoms and signs involving the genitourinary system: Secondary | ICD-10-CM

## 2019-02-11 LAB — URINALYSIS, ROUTINE W REFLEX MICROSCOPIC
Bilirubin Urine: NEGATIVE
Glucose, UA: NEGATIVE
Hgb urine dipstick: NEGATIVE
Hyaline Cast: NONE SEEN /LPF
Ketones, ur: NEGATIVE
Nitrite: NEGATIVE
Protein, ur: NEGATIVE
Specific Gravity, Urine: 1.015 (ref 1.001–1.03)
pH: 7 (ref 5.0–8.0)

## 2019-02-11 LAB — MICROSCOPIC MESSAGE

## 2019-02-11 MED ORDER — CIPROFLOXACIN HCL 500 MG PO TABS: 500.0000 mg | ORAL_TABLET | Freq: Two times a day (BID) | ORAL | 0 refills | Status: DC

## 2019-02-11 NOTE — Progress Notes (Signed)
Subjective:    Patient ID: Heather EatonCynthia M Sleep, female    DOB: 1951-09-10, 68 y.o.   MRN: [id number]  HPI Over the last few days, the patient has developed low back pain.  The pain is located around the level of L5-S1.  However it is more in the flank area above the gluteus on either side.  It is a deep pain.  Nothing makes it better.  Nothing really makes it worse.  Occasionally if she massages that area will feel slightly better.  Around the same time she is developed a foul-smelling odor to her urine.  Is extremely strong and musty smelling.  She does have some mild frequency.  She denies any dysuria.  She denies any urgency.  She denies any hesitancy.  She denies any CVA tenderness.  She denies any pelvic pain up until this morning.  She has now developed some mild pelvic discomfort.  She had a knee surgery done in the end of March and had a catheter in place for a day or so.  She is concerned that she may have developed a urinary tract infection that is now starting to become symptomatic. Past Medical History:  Diagnosis Date  . Anemia    "onc;e; had to take iron for awhile"  . Atrial fibrillation (HCC)    Began in 2004.  [facility].  She is off coumadin  now with no noted recurrent atrial fibrillation since 2007. til 04/01/12; s/p initiation of Rhythmol with DCCV June 2013; reports 05-18-18 that she is in aflutter   . Carpal tunnel syndrome   . Degenerative disk disease   . GERD (gastroesophageal reflux disease)    resolved after lap band  . History of blood transfusion    "w/both hip replacements"  . HTN (hypertension)   . Hypothyroidism   . IBS (irritable bowel syndrome)   . Interstitial cystitis   . Morton's neuroma    right foot  . Obesity   . On home oxygen therapy    2L continuous daytime; at night uses 3L with CPAP at nighttime   . Pneumonitis dx 1 year ago   cryptogenic organizing pneumonitis; treated with prednisone   . Pulmonary hypertension  (HCC)   . Shortness of breath    only without oxygen  . Sleep apnea 04/01/12   "dx'd just last week"  . Sleep apnea    uses CPAP, 11 CWP with residual AHI 6.3   Past Surgical History:  Procedure Laterality Date  . ANTERIOR AND POSTERIOR REPAIR  10/20/2012   Procedure: ANTERIOR (CYSTOCELE) AND POSTERIOR REPAIR (RECTOCELE);  Surgeon: Esmeralda ArthurSandra A Rivard, MD;  Location: [facility] ORS;  Service: Gynecology;  Laterality: N/A;  2 hours  . ATRIAL FIBRILLATION ABLATION N/A 12/30/2012   Procedure: ATRIAL FIBRILLATION ABLATION;  Surgeon: Hillis RangeJames Allred, MD;  [facility];  Service: Cardiovascular;  Laterality: N/A;  . BACK SURGERY    . CARDIAC CATHETERIZATION    . CARDIAC ELECTROPHYSIOLOGY MAPPING AND ABLATION  ~ 2005 and 2007   "did more 2nd time; [facility]"  . CARDIOVERSION  04/02/2012   Procedure: CARDIOVERSION;  Surgeon: Laurey Moralealton S McLean, MD;  [facility];  Service: Cardiovascular;  Laterality: N/A;  . CARDIOVERSION  06/16/2012   Procedure: CARDIOVERSION;  Surgeon: Vesta MixerPhilip J Nahser, MD;  [facility];  Service: Cardiovascular;  Laterality: N/A;  amanda/ebp/Beverly( or scheduling)  . CARDIOVERSION  10/29/2012   Procedure: CARDIOVERSION;  Surgeon: Eliot Fordalton S  Shirlee Latch, MD;  Location: [facility] ENDOSCOPY;  Service: Cardiovascular;  Laterality: N/A;  . CARDIOVERSION N/A 03/30/2013   Procedure: CARDIOVERSION;  Surgeon: Laurey Morale, MD;  Location: [facility] ENDOSCOPY;  Service: Cardiovascular;  Laterality: N/A;  . CARDIOVERSION N/A 12/23/2013   Procedure: CARDIOVERSION;  Surgeon: Lars Masson, MD;  Location: [facility] ENDOSCOPY;  Service: Cardiovascular;  Laterality: N/A;  9:27 Propofol , IV   150 joules synched shock by Dr. Delton See @ 150 joules...SR   post 12 lead ordered.   . CARPAL TUNNEL RELEASE  1990's   bilaterally  . DILATION AND CURETTAGE OF UTERUS  2000  . JOINT REPLACEMENT     bilateral hips  . KNEE ARTHROSCOPY Right 11/18/2016   Procedure: RIGHT KNEE ARTHROSCOPY WITH DEBRIDEMENT;   Surgeon: Kathryne Hitch, MD;  Location: [facility] OR;  Service: Orthopedics;  Laterality: Right;  . LAPAROSCOPIC GASTRIC BAND REMOVAL WITH LAPAROSCOPIC GASTRIC SLEEVE RESECTION N/A 05/24/2018   Procedure: LAPAROSCOPIC GASTRIC BAND REMOVAL WITH LAPAROSCOPIC , GASTRIC SLEEVE RESECTION, UPPER ENDO, ERAS Pathway;  Surgeon: Ovidio Kin, MD;  Location: [facility] ORS;  Service: General;  Laterality: N/A;  . LAPAROSCOPIC GASTRIC BANDING  2009  . LASIK    . POSTERIOR FUSION LUMBAR SPINE  2000's   "nerve problems"  . POSTERIOR FUSION LUMBAR SPINE  2000's  . TEE WITHOUT CARDIOVERSION N/A 12/29/2012   Procedure: TRANSESOPHAGEAL ECHOCARDIOGRAM (TEE);  Surgeon: Peter M Swaziland, MD;  Location: [facility] ENDOSCOPY;  Service: Cardiovascular;  Laterality: N/A;  . TONSILLECTOMY AND ADENOIDECTOMY  1957  . TOTAL HIP ARTHROPLASTY  ~2009; 2010   right; left  . TOTAL KNEE ARTHROPLASTY Right 12/17/2018   Procedure: RIGHT TOTAL KNEE ARTHROPLASTY;  Surgeon: Kathryne Hitch, MD;  Location: [facility] ORS;  Service: Orthopedics;  Laterality: Right;  . TUBAL LIGATION  1980  . VAGINAL HYSTERECTOMY  2000   Current Outpatient Medications on File Prior to Visit  Medication Sig Dispense Refill  . albuterol (PROVENTIL HFA;VENTOLIN HFA) 108 (90 Base) MCG/ACT inhaler INHALE 2 PUFFS INTO THE LUNGS EVERY 4 HOURS AS NEEDED FOR WHEEZING/SHORTNESS OF BREATH (Patient taking differently: Inhale 2 puffs into the lungs every 4 (four) hours as needed for wheezing or shortness of breath. ) 8.5 Inhaler 0  . amitriptyline (ELAVIL) 10 MG tablet Take 1 tablet (10 mg total) by mouth at bedtime. 90 tablet 0  . Carboxymethylcellul-Glycerin (LUBRICATING EYE DROPS OP) Place 1 drop into both eyes daily as needed (dry eyes).    . cetirizine (ZYRTEC) 10 MG tablet Take 10 mg by mouth at bedtime.     . Cholecalciferol (VITAMIN D) 2000 UNITS tablet Take 2,000 Units by mouth daily.    . clonazePAM (KLONOPIN) 0.5 MG tablet TAKE 1/2 TO 1 TABLET BY MOUTH TWICE A DAY  (Patient taking differently: Take 0.25 mg by mouth 2 (two) times daily. TAKE 1/2 TO 1 TABLET BY MOUTH TWICE A DAY) 60 tablet 3  . diclofenac sodium (VOLTAREN) 1 % GEL Apply 2 g topically 2 (two) times daily. 2 Tube 2  . diltiazem (CARDIZEM CD) 120 MG 24 hr capsule Take 120 mg by mouth daily.     Marland Kitchen dronedarone (MULTAQ) 400 MG tablet Take 400 mg by mouth 2 (two) times daily with a meal.    . ELIQUIS 5 MG TABS tablet TAKE 1 TABLET BY MOUTH TWICE A DAY....NEED OFFICE VISIT BEFORE ANY MORE REFILLS (Patient taking differently: Take 5 mg by mouth 2 (two) times daily. ) 60 tablet 6  . furosemide (LASIX) 40 MG tablet TAKE 2  TABLETS BY MOUTH IN THE MORNING (Patient taking differently: Take 80 mg by mouth daily. ) 180 tablet 2  . guaiFENesin (MUCINEX) 600 MG 12 hr tablet Take 600 mg by mouth 2 (two) times daily as needed (allergies).     Marland Kitchen HYDROcodone-acetaminophen (NORCO) 5-325 MG tablet Take 1-2 tablets by mouth every 6 (six) hours as needed for moderate pain. One to two tabs every 4-6 hours for pain 40 tablet 0  . levothyroxine (SYNTHROID, LEVOTHROID) 75 MCG tablet Take 1 tablet (75 mcg total) by mouth daily before breakfast. 30 tablet 11  . losartan (COZAAR) 100 MG tablet TAKE 1 TABLET BY MOUTH  DAILY (Patient taking differently: Take 100 mg by mouth daily. ) 90 tablet 1  . Magnesium 250 MG TABS Take 250 mg by mouth 2 (two) times daily.    . methocarbamol (ROBAXIN) 500 MG tablet Take 1 tablet (500 mg total) by mouth every 6 (six) hours as needed for muscle spasms. 40 tablet 1  . metoprolol tartrate (LOPRESSOR) 25 MG tablet Take 25 mg by mouth 2 (two) times daily.   11  . montelukast (SINGULAIR) 10 MG tablet TAKE 1 TABLET BY MOUTH EVERYDAY AT BEDTIME (Patient taking differently: Take 10 mg by mouth daily. ) 90 tablet 1  . Multiple Vitamins-Minerals (BARIATRIC MULTIVITAMINS/IRON PO) Take 1 tablet by mouth daily.    . OXYGEN Inhale 2 L into the lungs continuous.    . predniSONE (DELTASONE) 5 MG tablet Take 0.5  tablets (2.5 mg total) by mouth daily with breakfast.    . traMADol (ULTRAM) 50 MG tablet Take 1-2 tablets (50-100 mg total) by mouth every 6 (six) hours as needed. 60 tablet 0  . vitamin C (ASCORBIC ACID) 500 MG tablet Take 500 mg by mouth daily.     No current facility-administered medications on file prior to visit.    Allergies  Allergen Reactions  . Cephalexin Anaphylaxis and Hives    "throat started closing up"  . Rocephin [Ceftriaxone Sodium In Dextrose] Anaphylaxis  . Penicillins Rash    Has patient had a PCN reaction causing immediate rash, facial/tongue/throat swelling, SOB or lightheadedness with hypotension: no Has patient had a PCN reaction causing severe rash involving mucus membranes or skin necrosis: no Has patient had a PCN reaction that required hospitalization no Has patient had a PCN reaction occurring within the last 10 years: no If all of the above answers are "NO", then may proceed with Cephalosporin use.  . Flecainide Acetate Other (See Comments)    Jittery   . Moxifloxacin     Does not remember the reaction  . Sulfonamide Derivatives     Does not remember reaction ; "was so young when I had reaction to it"   Social History   Socioeconomic History  . Marital status: Married    Spouse name: Not on file  . Number of children: Not on file  . Years of education: Not on file  . Highest education level: Not on file  Occupational History  . Not on file  Social Needs  . Financial resource strain: Not hard at all  . Food insecurity:    Worry: Never true    Inability: Never true  . Transportation needs:    Medical: Not on file    Non-medical: Not on file  Tobacco Use  . Smoking status: Never Smoker  . Smokeless tobacco: Never Used  Substance and Sexual Activity  . Alcohol use: No  . Drug use: No  . Sexual  activity: Yes    Comment: LAVH  Lifestyle  . Physical activity:    Days per week: Not on file    Minutes per session: Not on file  . Stress: Not  on file  Relationships  . Social connections:    Talks on phone: Not on file    Gets together: Not on file    Attends religious service: Not on file    Active member of club or organization: Not on file    Attends meetings of clubs or organizations: Not on file    Relationship status: Not on file  . Intimate partner violence:    Fear of current or ex partner: Not on file    Emotionally abused: Not on file    Physically abused: Not on file    Forced sexual activity: Not on file  Other Topics Concern  . Not on file  Social History Narrative   Works in data entry in [employer]   Married, lives in [place]   Tobacco Use - No.    Alcohol Use - no      Review of Systems  All other systems reviewed and are negative.      Objective:   Physical Exam Vitals signs reviewed.  Constitutional:      Appearance: Normal appearance. She is obese.  Cardiovascular:     Rate and Rhythm: Normal rate. Rhythm irregular.     Heart sounds: No murmur. No friction rub. No gallop.   Pulmonary:     Effort: Pulmonary effort is normal. No respiratory distress.     Breath sounds: Normal breath sounds. No stridor. No wheezing, rhonchi or rales.  Chest:     Chest wall: No tenderness.  Abdominal:     General: Bowel sounds are normal. There is no distension.     Palpations: Abdomen is soft.     Tenderness: There is no abdominal tenderness. There is no guarding or rebound.     Hernia: No hernia is present.  Neurological:     Mental Status: She is alert.           Assessment & Plan:  Urinary problem - Plan: Urinalysis, Routine w reflex microscopic, Urine Culture  Urinalysis only shows trace leukocyte esterase.  Symptoms and urinalysis are not consistent with a severe urinary tract infection.  This could be due to her interstitial cystitis or perhaps even dietary given the fact she eats a ketogenic diet.  I recommended flushing her bladder with cranberry juice over the weekend and drinking  more fluids.  If symptoms persist she can start taking Cipro 500 mg twice daily for 3 days.  Although she has documented allergy to Avelox, the patient states that she has taken Cipro in the past with no trouble.  I will send a urine culture and verify this with her on Monday.  She will try symptomatic relief with cranberry juice over the weekend first prior to starting antibiotics

## 2019-02-14 ENCOUNTER — Other Ambulatory Visit: Payer: Self-pay | Admitting: Family Medicine

## 2019-02-14 LAB — URINE CULTURE
MICRO NUMBER:: 438704
SPECIMEN QUALITY:: ADEQUATE

## 2019-02-14 MED ORDER — NITROFURANTOIN MONOHYD MACRO 100 MG PO CAPS: 100.0000 mg | ORAL_CAPSULE | Freq: Two times a day (BID) | ORAL | 0 refills | Status: DC

## 2019-02-21 ENCOUNTER — Ambulatory Visit: Payer: Self-pay | Admitting: Dietician

## 2019-02-28 ENCOUNTER — Ambulatory Visit (INDEPENDENT_AMBULATORY_CARE_PROVIDER_SITE_OTHER): Payer: Medicare Other | Admitting: Orthopaedic Surgery

## 2019-02-28 ENCOUNTER — Other Ambulatory Visit: Payer: Self-pay

## 2019-02-28 ENCOUNTER — Encounter: Payer: Self-pay | Admitting: Orthopaedic Surgery

## 2019-02-28 DIAGNOSIS — Z96651 Presence of right artificial knee joint: Secondary | ICD-10-CM

## 2019-02-28 MED ORDER — TRAMADOL HCL 50 MG PO TABS: 50.0000 mg | ORAL_TABLET | Freq: Four times a day (QID) | ORAL | 0 refills | Status: DC | PRN

## 2019-02-28 NOTE — Progress Notes (Signed)
The patient is well-known to me.  She is 73 days status post a right total knee arthroplasty.  I had followed her for many years and in light of her morbid obesity we felt comfortable with proceeding with a right knee replacement.  We replaced her hips before as well.  Her right knee was extensively deformed.  She is ambulating with just a cane.  She is doing much better overall and is only taking tramadol for pain.  On exam her extension is full on her right knee and I can flex her this past 110 degrees.  It feels stable overall.  She is very pleased thus far.  At this point I do not to see her back for 3 months.  We will need an AP and lateral of her right knee at that visit.  All question concerns were answered and addressed.

## 2019-03-01 ENCOUNTER — Encounter: Payer: Self-pay | Admitting: Dietician

## 2019-03-01 ENCOUNTER — Encounter: Payer: Medicare Other | Attending: Surgery | Admitting: Dietician

## 2019-03-01 VITALS — Wt 236.3 lb

## 2019-03-01 DIAGNOSIS — Z9884 Bariatric surgery status: Secondary | ICD-10-CM | POA: Insufficient documentation

## 2019-03-01 DIAGNOSIS — E669 Obesity, unspecified: Secondary | ICD-10-CM

## 2019-03-01 NOTE — Progress Notes (Signed)
Bariatric Follow-Up Visit 9 Months Post-Operative LAGB to Sleeve Surgery Appt Start Time: 11:30am  End Time: 12:00pm  Primary concerns today: Post-Operative Bariatric Surgery Nutrition Management  NUTRITION ASSESSMENT Surgery date: 05/24/2018 Surgery type: LAGB to Sleeve Start weight at [facility]: 322.5 (date: 09/07/2017) Weight today: 236.3 lbs Weight change: -11.7 lbs (since 11/23/2018) Total weight loss: -86.2 lbs BMI: 38.72  Pt states her ultimate weight goal is to weigh less than 200 lbs.   TANITA  BODY COMP 11/24/2018   BMI (kg/m^2) 41.3   Fat Mass (lbs) 117.6   Fat Free Mass (lbs) 130.4   Total Body Water (lbs)         94.6    Body Composition Scale 03/01/2019  Total Body Fat % 45.8  Visceral Fat 18  Fat-Free Mass % 54.1   Total Body Water % 41.56   Muscle-Mass lbs 29.4  Body Fat Displacement          Torso  lbs 67.1         Left Leg  lbs 13.4         Right Leg  lbs 13.4         Left Arm  lbs 6.7         Right Arm   lbs 6.7   24 Hr Recall B: egg + meat  S: low carb protein bar L: protein + non-starchy vegetable S: sugar-free popsicles  D: protein + non-starchy vegetable  B: water, flavored water   Pt states that proteins usually include chicken, beef, fish, and any meat. Only eating starchy vegetable (peas, pintos) about 1x/week, states she tries to avoid carbohydrates. Believes low-carb is best for her. States she notices stalls in weight loss when she eats more carbs, but likes now having starchy vegetables and fruits as options.   Fluid intake: 64+ oz  Medications: see list  Supplementation: bariatric MVI, calcium   Using straws: yes  Drinking while eating: no Chewing/swallowing difficulties: no Changes in vision: no Changes to mood/headaches: no Hair loss/Changes to skin/Changes to nails: no Any difficulty focusing or concentrating: no Sweating: no Dizziness/Lightheaded: no Palpitations: no  Carbonated beverages: no N/V/D/C/GAS: no Abdominal Pain:  no Dumping syndrome: no  Recent physical activity:  ADL's, physical therapy for knee, recumbent bike, walking up and down driveway. Activity has drastically increased since bariatric surgery and knee surgery 2 months ago.     NUTRITION INTERVENTION  Information Reviewed/ Discussed During Appointment: -Review of composition scale numbers -Fluid requirements (64-100 ounces) -Protein requirements (60-80g) -Strategies for tolerating diet -Advancement of diet to include fruit -Barriers to inclusion of new foods -Inclusion of appropriate multivitamin and calcium supplements  -Exercise recommendations   Progress Towards Goal(s):  In Progress  Pt is eating non-starchy vegetables daily and would like to continue this.   Pt is still working towards 150 minutes of weekly exercise, but has made much progress since last appointment 3 months ago.   Handouts given during visit include:  Phase VI: Protein + All Vegetables + Fruit    Teaching Method Utilized:  Visual Auditory Hands on  Demonstrated degree of understanding via:  Teach Back   MONITORING / EVALUATION Dietary intake, exercise, body weight, and goals in 3 months.   Patient is to follow up at [facility] in 3 months for 12 Month Bariatric Class.

## 2019-03-01 NOTE — Patient Instructions (Signed)
.   Continue to aim for a minimum of 64 fluid ounces 7 days a week with at least 30 ounces being plain water . Eat non-starchy vegetables 2 times a day 7 days a week . Per meal/snack, eat 3 ounces of protein first then start on non-starchy vegetables; once you understand how much of your meal leads to satisfaction (and not full) while still eating 3 ounces of protein and non-starchy vegetables, you can eat them in any order (figure out how much you can eat at a time to get enough but not too much)  . Incorporate starchy vegetables and fruit as desired. Make sure to get adequate protein and non-starchy vegetables first.  . Continue to aim for 30 minutes of physical activity at least 5 times a week  Keep up the GREAT work! Lots of progress made thus far, you have a lot to be proud of.  See you in 3 months!

## 2019-03-26 ENCOUNTER — Other Ambulatory Visit: Payer: Self-pay | Admitting: Pulmonary Disease

## 2019-04-01 ENCOUNTER — Other Ambulatory Visit: Payer: Self-pay | Admitting: Pulmonary Disease

## 2019-04-13 ENCOUNTER — Other Ambulatory Visit: Payer: Self-pay | Admitting: Family Medicine

## 2019-04-13 MED ORDER — MONTELUKAST SODIUM 10 MG PO TABS: ORAL_TABLET | ORAL | 1 refills | Status: DC

## 2019-04-26 ENCOUNTER — Other Ambulatory Visit: Payer: Self-pay | Admitting: *Deleted

## 2019-04-26 MED ORDER — AMITRIPTYLINE HCL 10 MG PO TABS: 10.0000 mg | ORAL_TABLET | Freq: Every day | ORAL | 0 refills | Status: DC

## 2019-04-29 ENCOUNTER — Telehealth: Payer: Self-pay | Admitting: *Deleted

## 2019-04-29 NOTE — Telephone Encounter (Signed)

## 2019-05-02 ENCOUNTER — Ambulatory Visit (INDEPENDENT_AMBULATORY_CARE_PROVIDER_SITE_OTHER)
Admission: RE | Admit: 2019-05-02 | Discharge: 2019-05-02 | Disposition: A | Discharge status: Home / Self Care | Payer: Medicare Other | Source: Ambulatory Visit | Attending: Pulmonary Disease | Admitting: Pulmonary Disease

## 2019-05-02 ENCOUNTER — Other Ambulatory Visit: Payer: Self-pay

## 2019-05-02 DIAGNOSIS — J849 Interstitial pulmonary disease, unspecified: Secondary | ICD-10-CM

## 2019-05-03 ENCOUNTER — Telehealth: Payer: Self-pay | Admitting: Pulmonary Disease

## 2019-05-03 ENCOUNTER — Telehealth: Payer: Self-pay | Admitting: Family Medicine

## 2019-05-03 MED ORDER — AMITRIPTYLINE HCL 10 MG PO TABS: 10.0000 mg | ORAL_TABLET | Freq: Every day | ORAL | 1 refills | Status: DC

## 2019-05-03 NOTE — Telephone Encounter (Signed)
Please resend to amitriptyline to [address]. They said they did not get the first request sent on 04/26/2019

## 2019-05-03 NOTE — Telephone Encounter (Signed)
Dr. Halford Chessman, I viewed your schedule. You do not have any openings until August 12th. Are you ok with [place] double booking the patient for a sooner appointment? Please advise. Thanks!

## 2019-05-03 NOTE — Telephone Encounter (Signed)
Medication called/sent to requested pharmacy  

## 2019-05-03 NOTE — Telephone Encounter (Signed)
Okay to double book appointment. 

## 2019-05-03 NOTE — Telephone Encounter (Signed)
HRCT chest 05/02/19 >> mild atherosclerosis, CM, PA 4.1 cm, lungs clear   Please schedule ROV with me to review CT chest results.

## 2019-05-03 NOTE — Telephone Encounter (Signed)
Called and spoke with Patient.  CT results given. Understanding stated.  OV to review results scheduled for 05/06/19, 1200.  Nothing further at this time.

## 2019-05-06 ENCOUNTER — Encounter: Payer: Self-pay | Admitting: Pulmonary Disease

## 2019-05-06 ENCOUNTER — Ambulatory Visit (INDEPENDENT_AMBULATORY_CARE_PROVIDER_SITE_OTHER): Payer: Medicare Other | Admitting: Pulmonary Disease

## 2019-05-06 ENCOUNTER — Other Ambulatory Visit: Payer: Self-pay

## 2019-05-06 VITALS — BP 110/62 | HR 62 | Temp 97.9°F | Ht 65.5 in | Wt 228.6 lb

## 2019-05-06 DIAGNOSIS — I272 Pulmonary hypertension, unspecified: Secondary | ICD-10-CM

## 2019-05-06 DIAGNOSIS — G4733 Obstructive sleep apnea (adult) (pediatric): Secondary | ICD-10-CM

## 2019-05-06 DIAGNOSIS — F19982 Other psychoactive substance use, unspecified with psychoactive substance-induced sleep disorder: Secondary | ICD-10-CM | POA: Diagnosis not present

## 2019-05-06 DIAGNOSIS — J849 Interstitial pulmonary disease, unspecified: Secondary | ICD-10-CM | POA: Diagnosis not present

## 2019-05-06 MED ORDER — PREDNISONE 1 MG PO TABS: ORAL_TABLET | ORAL | 0 refills | Status: DC

## 2019-05-06 MED ORDER — TRAZODONE HCL 50 MG PO TABS: 50.0000 mg | ORAL_TABLET | Freq: Every evening | ORAL | 1 refills | Status: DC | PRN

## 2019-05-06 NOTE — Patient Instructions (Signed)
Trazodone 50 mg pill nightly as needed to help sleep  Will continue to slowly decrease your dose of prednisone  Follow up in 2 months

## 2019-05-06 NOTE — Progress Notes (Signed)
[facility]  Chief Complaint  Patient presents with  . Follow-up    Patient reports that her breathing is doing great. She reports that she is doing well with her CPAP machine sinc she changed to a fiull face mask.     Constitutional:  BP 110/62 (BP Location: Left Arm, Cuff Size: Normal)   Pulse 62   Temp 97.9 F (36.6 C) (Oral)   Ht 5' 5.5" (1.664 m)   Wt 228 lb 9.6 oz (103.7 kg)   SpO2 96%   BMI 37.46 kg/m   Past Medical History:  IBS, Cystitis, Hypothyroidism, HTN, GERD, DJD, Carpal tunnel, A fib, Lymphedema  Brief Summary:  Heather [name] is a 68 y.o. female with cryptogenic organizing pneumonia, nocturnal hypoxemia, pulmonary HTN, OSA, and restrictive lung disease.  Home sleep study showed moderate sleep apnea.  Using CPAP nightly.  No issue with mask fit.  Feels pressure setting is okay.  Not having dry mouth, sinus congestion, sore throat.  Breathing okay.  Not having cough, wheeze, sputum, or chest pain.  CT chest from 05/02/19 (reviewed by me) showed increased PA and CM, but no residual of ILD.  She had knee surgery.  Now off tramadol.  Having trouble falling asleep and staying asleep since.  Has tried melatonin.  Goes to bed at 1130 pm, wakes up at 7 am.  Feels tired all the time.  Tries not to nap.  Avoids bright light around bedtime.  Physical Exam:   Appearance - well kempt   ENMT - no sinus tenderness, no nasal discharge, no oral exudate  Neck - no masses, trachea midline, no thyromegaly, no elevation in JVP  Respiratory - normal appearance of chest wall, normal respiratory effort w/o accessory muscle use, no dullness on percussion, no wheezing or rales  CV - s1s2 regular rate and rhythm, no murmurs, 2+ peripheral edema, radial pulses symmetric  GI - soft, non tender  Lymph - no adenopathy noted in neck and axillary areas  MSK - normal gait  Ext - no cyanosis, clubbing, or joint inflammation noted  Skin - no  rashes, lesions, or ulcers  Neuro - normal strength, oriented x 3  Psych - normal mood and affect   Assessment/Plan:   Interstitial lung disease. - she has serology positive for ACE and RNP - most recent CT chest looked good - will continue to gradually taper her dose of prednisone  Chronic respiratory failure with hypoxia. - continue supplemental oxygen with exertion and at night for now  Obstructive sleep apnea. - she is compliant with CPAP and reports benefit - continue auto CPAP  WHO group 2 and 3 pulmonary hypertension. - will repeat Echo and forward copy to her cardiologist  Insomnia after recently discontinuing tramadol. - discussed stimulus control, sleep restriction, and relaxation techniques - continue melatonin prn >> discussed timing of dose for this - she can continue elavil at night - will try her on trazodone qhs prn   Patient Instructions  Trazodone 50 mg pill nightly as needed to help sleep  Will continue to slowly decrease your dose of prednisone  Follow up in 2 months    Chesley Mires, MD [facility] Pager: [phone] 05/06/2019, 12:43 PM  Flow Sheet     Pulmonary tests:  PFT 12/16/13 >> FEV1 1.84 (72%), FEV1% 84, DLCO 74% Serology 01/30/17 >> RNP 6.5, ACE 76 postive; SCL 70, ANCA, SSA/SSB, ANA, anti CCP, RF negative  Chest imaging:  CT chest  08/27/18 >> diffuse GGO and interlobular septal thickening most in mid/lower lungs HRCT chest 05/02/19 >> mild atherosclerosis, CM, PA 4.1 cm, lungs clear  Sleep tests:  PSG 03/25/12 >> AHI 24.7, SpO2 low 76% Auto CPAP 10/16/18 to 11/14/18 >> used on 26 of 30 nights with average 8 hrs 33 min.  Average AHI 0.6 with median CPAP 9 and 95 th percentile CPAP 12 cm H2O HST 11/25/18 >> AHI 23.7, SpO2 low 80% Auto CPAP 04/06/19 to 05/05/19 >> used on 30 of 30 nights with average 6 hrs 40 min.  Average AHI 1.1 with median CPAP 10 and 95 th percentile CPAP 12 cm H2O  Cardiac tests:  Echo 01/06/18  >> EF 60 to 65%, mod TR, PAS 76 mmHg  Medications:   Allergies as of 05/06/2019      Reactions   Cephalexin Anaphylaxis, Hives   "throat started closing up"   Rocephin [ceftriaxone Sodium In Dextrose] Anaphylaxis   Penicillins Rash   Has patient had a PCN reaction causing immediate rash, facial/tongue/throat swelling, SOB or lightheadedness with hypotension: no Has patient had a PCN reaction causing severe rash involving mucus membranes or skin necrosis: no Has patient had a PCN reaction that required hospitalization no Has patient had a PCN reaction occurring within the last 10 years: no If all of the above answers are "NO", then may proceed with Cephalosporin use.   Flecainide Acetate Other (See Comments)   Jittery    Moxifloxacin    Does not remember the reaction   Sulfonamide Derivatives    Does not remember reaction ; "was so young when I had reaction to it"      Medication List       Accurate as of May 06, 2019 12:43 PM. If you have any questions, ask your nurse or doctor.        STOP taking these medications   ciprofloxacin 500 MG tablet Commonly known as: Cipro Stopped by: Coralyn HellingVineet Florencia Zaccaro, MD   HYDROcodone-acetaminophen 5-325 MG tablet Commonly known as: Norco Stopped by: Coralyn HellingVineet Lachlan Pelto, MD   methocarbamol 500 MG tablet Commonly known as: ROBAXIN Stopped by: Coralyn HellingVineet Finola Rosal, MD   nitrofurantoin (macrocrystal-monohydrate) 100 MG capsule Commonly known as: Macrobid Stopped by: Coralyn HellingVineet Cassiopeia Florentino, MD   traMADol 50 MG tablet Commonly known as: ULTRAM Stopped by: Coralyn HellingVineet Johnathyn Viscomi, MD     TAKE these medications   albuterol 108 (90 Base) MCG/ACT inhaler Commonly known as: VENTOLIN HFA INHALE 2 PUFFS INTO THE LUNGS EVERY 4 HOURS AS NEEDED FOR WHEEZING/SHORTNESS OF BREATH What changed: See the new instructions.   amitriptyline 10 MG tablet Commonly known as: ELAVIL Take 1 tablet (10 mg total) by mouth at bedtime.   BARIATRIC MULTIVITAMINS/IRON PO Take 1 tablet by mouth daily.    cetirizine 10 MG tablet Commonly known as: ZYRTEC Take 10 mg by mouth at bedtime.   clonazePAM 0.5 MG tablet Commonly known as: KLONOPIN TAKE 1/2 TO 1 TABLET BY MOUTH TWICE A DAY What changed:   how much to take  how to take this  when to take this   diclofenac sodium 1 % Gel Commonly known as: VOLTAREN Apply 2 g topically 2 (two) times daily.   diltiazem 120 MG 24 hr capsule Commonly known as: CARDIZEM CD Take 120 mg by mouth daily.   dronedarone 400 MG tablet Commonly known as: MULTAQ Take 400 mg by mouth 2 (two) times daily with a meal.   Eliquis 5 MG Tabs tablet Generic drug: apixaban TAKE 1 TABLET  BY MOUTH TWICE A DAY....NEED OFFICE VISIT BEFORE ANY MORE REFILLS What changed: See the new instructions.   furosemide 40 MG tablet Commonly known as: LASIX TAKE 2 TABLETS BY MOUTH IN THE MORNING What changed: See the new instructions.   guaiFENesin 600 MG 12 hr tablet Commonly known as: MUCINEX Take 600 mg by mouth 2 (two) times daily as needed (allergies).   levothyroxine 75 MCG tablet Commonly known as: SYNTHROID Take 1 tablet (75 mcg total) by mouth daily before breakfast.   losartan 100 MG tablet Commonly known as: COZAAR TAKE 1 TABLET BY MOUTH  DAILY   LUBRICATING EYE DROPS OP Place 1 drop into both eyes daily as needed (dry eyes).   Magnesium 250 MG Tabs Take 250 mg by mouth 2 (two) times daily.   metoprolol tartrate 25 MG tablet Commonly known as: LOPRESSOR Take 12.5 mg by mouth 2 (two) times daily.   montelukast 10 MG tablet Commonly known as: SINGULAIR TAKE 1 TABLET BY MOUTH EVERYDAY AT BEDTIME   OXYGEN Inhale 2 L into the lungs continuous.   predniSONE 1 MG tablet Commonly known as: DELTASONE Take 2 tablets (2 mg total) by mouth daily with breakfast for 21 days, THEN 1 tablet (1 mg total) daily with breakfast for 21 days. Start taking on: May 06, 2019 What changed:   medication strength  See the new instructions. Changed by:  Coralyn HellingVineet Frida Wahlstrom, MD   traZODone 50 MG tablet Commonly known as: DESYREL Take 1 tablet (50 mg total) by mouth at bedtime as needed for sleep. Started by: Coralyn HellingVineet Kolby Schara, MD   vitamin C 500 MG tablet Commonly known as: ASCORBIC ACID Take 500 mg by mouth daily.   Vitamin D 50 MCG (2000 UT) tablet Take 2,000 Units by mouth daily.       Past Surgical History:  She  has a past surgical history that includes Back surgery; Carpal tunnel release (1990's); LASIK; Cardiac catheterization; Laparoscopic gastric banding (2009); Cardiac electrophysiology mapping and ablation (~ 2005 and 2007); Tonsillectomy and adenoidectomy (1324(1957); Vaginal hysterectomy (2000); Tubal ligation (1980); Dilation and curettage of uterus (2000); Total hip arthroplasty (~2009; 2010); Joint replacement; Posterior fusion lumbar spine (2000's); Posterior fusion lumbar spine (2000's); Cardioversion (04/02/2012); Cardioversion (06/16/2012); Anterior and posterior repair (10/20/2012); Cardioversion (10/29/2012); TEE without cardioversion (N/A, 12/29/2012); Cardioversion (N/A, 03/30/2013); Cardioversion (N/A, 12/23/2013); atrial fibrillation ablation (N/A, 12/30/2012); Knee arthroscopy (Right, 11/18/2016); Laparoscopic gastric band removal with laparoscopic gastric sleeve resection (N/A, 05/24/2018); and Total knee arthroplasty (Right, 12/17/2018).  Family History:  Her family history includes Atrial fibrillation in her mother; Dementia in her father; Diabetes in her brother, father, and sister; Heart attack in an other family member; Heart disease in her maternal grandmother; Hypertension in her father.  Social History:  She  reports that she has never smoked. She has never used smokeless tobacco. She reports that she does not drink alcohol or use drugs.

## 2019-05-18 ENCOUNTER — Other Ambulatory Visit: Payer: Self-pay | Admitting: Pulmonary Disease

## 2019-05-18 ENCOUNTER — Other Ambulatory Visit: Payer: Self-pay | Admitting: Family Medicine

## 2019-05-28 ENCOUNTER — Other Ambulatory Visit: Payer: Self-pay | Admitting: Pulmonary Disease

## 2019-05-31 ENCOUNTER — Ambulatory Visit: Payer: Self-pay

## 2019-05-31 ENCOUNTER — Other Ambulatory Visit: Payer: Self-pay

## 2019-05-31 ENCOUNTER — Encounter: Payer: Self-pay | Admitting: Orthopaedic Surgery

## 2019-05-31 ENCOUNTER — Ambulatory Visit (INDEPENDENT_AMBULATORY_CARE_PROVIDER_SITE_OTHER): Payer: Medicare Other | Admitting: Orthopaedic Surgery

## 2019-05-31 ENCOUNTER — Encounter: Payer: Medicare Other | Attending: Surgery | Admitting: Skilled Nursing Facility1

## 2019-05-31 DIAGNOSIS — Z888 Allergy status to other drugs, medicaments and biological substances status: Secondary | ICD-10-CM | POA: Diagnosis not present

## 2019-05-31 DIAGNOSIS — Z881 Allergy status to other antibiotic agents status: Secondary | ICD-10-CM | POA: Insufficient documentation

## 2019-05-31 DIAGNOSIS — Z713 Dietary counseling and surveillance: Secondary | ICD-10-CM | POA: Insufficient documentation

## 2019-05-31 DIAGNOSIS — Z96651 Presence of right artificial knee joint: Secondary | ICD-10-CM

## 2019-05-31 DIAGNOSIS — E669 Obesity, unspecified: Secondary | ICD-10-CM

## 2019-05-31 DIAGNOSIS — Z79899 Other long term (current) drug therapy: Secondary | ICD-10-CM | POA: Insufficient documentation

## 2019-05-31 DIAGNOSIS — Z880 Allergy status to penicillin: Secondary | ICD-10-CM | POA: Insufficient documentation

## 2019-05-31 DIAGNOSIS — Z7901 Long term (current) use of anticoagulants: Secondary | ICD-10-CM | POA: Insufficient documentation

## 2019-05-31 DIAGNOSIS — Z7952 Long term (current) use of systemic steroids: Secondary | ICD-10-CM | POA: Diagnosis not present

## 2019-05-31 DIAGNOSIS — Z6837 Body mass index (BMI) 37.0-37.9, adult: Secondary | ICD-10-CM | POA: Insufficient documentation

## 2019-05-31 DIAGNOSIS — K219 Gastro-esophageal reflux disease without esophagitis: Secondary | ICD-10-CM | POA: Diagnosis not present

## 2019-05-31 DIAGNOSIS — G4733 Obstructive sleep apnea (adult) (pediatric): Secondary | ICD-10-CM | POA: Insufficient documentation

## 2019-05-31 DIAGNOSIS — E039 Hypothyroidism, unspecified: Secondary | ICD-10-CM | POA: Diagnosis not present

## 2019-05-31 DIAGNOSIS — Z882 Allergy status to sulfonamides status: Secondary | ICD-10-CM | POA: Diagnosis not present

## 2019-05-31 DIAGNOSIS — I10 Essential (primary) hypertension: Secondary | ICD-10-CM | POA: Diagnosis not present

## 2019-05-31 DIAGNOSIS — Z79890 Hormone replacement therapy (postmenopausal): Secondary | ICD-10-CM | POA: Diagnosis not present

## 2019-05-31 DIAGNOSIS — Z9884 Bariatric surgery status: Secondary | ICD-10-CM | POA: Diagnosis not present

## 2019-05-31 NOTE — Telephone Encounter (Signed)
yes

## 2019-05-31 NOTE — Progress Notes (Signed)
HPI: Mrs. Cogar returns today 6 months status post right total knee arthroplasty.  She states the knee is doing well.  She feels she has good motion and strength.  She continues to work on strengthening the knee.  She has no complaints in regards to the knee.  Review of systems: No fevers chills shortness breath chest pain  Physical exam: General well-developed well-nourished female no acute distress mood and affect appropriate Psych: Alert and oriented x3  Right knee: Full extension flexion to approximately 110 degrees.  No instability valgus varus stressing.  Surgical incisions well-healed.  Calf supple nontender.  Radiographs: Right knee 2 views: Knee is well located.  Arthroplasty components well-seated.  No evidence of fracture or bony abnormalities.  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  Follow-up with [place] in 6 months at that time we will obtain AP and lateral views of the right knee.  Questions encouraged and answered at length today.

## 2019-05-31 NOTE — Telephone Encounter (Signed)
Is this appropriate for refill ? 

## 2019-06-01 NOTE — Progress Notes (Signed)
Bariatric Class:  Appt start time: 6:00 end time: 7:00  12 Month Post-Operative Nutrition Class  Patient was seen on 06/01/2019 for Post-Operative Nutrition education at [facility].   Pt was upset she is not under 200 pounds. Dietitian educated the pt on her excess skin not being metabolically active but adding to the weight and pt felt a little better.   Surgery date: 05/24/2018 Surgery type: LAGB to Sleeve Start weight at [facility]: 322.5 (date: 09/07/2017) Weight today:229.8  Body Composition Scale 06/01/2019  Total Body Fat % 44.9  Visceral Fat 16  Fat-Free Mass % 55   Total Body Water % 42   Muscle-Mass lbs 29.6  Body Fat Displacement          Torso  lbs 63.9         Left Leg  lbs 12.7         Right Leg  lbs 12.7         Left Arm  lbs 6.3         Right Arm   lbs 6.3    The following the learning objectives were met by the patient during this course:  Review of TANITA scale information  Share and discuss bariatric surgery successes and non-scale victories  Identifies Phase VII (Maintenance Phase) Dietary Goals which will be lifelong  Identifies appropriate sources of fluids, proteins, non-starchy vegetables, and complex carbohydrates  Identifies well-balanced meals  Identifies portion control   Identifies appropriate multivitamin and calcium sources post-operatively  Describes the need for physical activity post-operatively and will follow MD recommendations  Identifies and describes SMART goals   Creates at least 2 SMART goals to begin immediately  States when to call healthcare provider regarding medication questions or post-operative complications  Handouts given during class include:  Phase VII: Maintenance Phase-Lifelong  Follow-Up Plan: Patient will follow-up at [facility] for on-going post-op nutrition visits.

## 2019-06-10 ENCOUNTER — Other Ambulatory Visit: Payer: Self-pay | Admitting: Pulmonary Disease

## 2019-07-06 ENCOUNTER — Encounter: Payer: Self-pay | Admitting: Pulmonary Disease

## 2019-07-06 ENCOUNTER — Ambulatory Visit (INDEPENDENT_AMBULATORY_CARE_PROVIDER_SITE_OTHER): Payer: Medicare Other | Admitting: Pulmonary Disease

## 2019-07-06 ENCOUNTER — Other Ambulatory Visit: Payer: Self-pay

## 2019-07-06 VITALS — BP 130/74 | HR 66 | Temp 97.4°F | Ht 65.5 in | Wt 232.0 lb

## 2019-07-06 DIAGNOSIS — G4733 Obstructive sleep apnea (adult) (pediatric): Secondary | ICD-10-CM

## 2019-07-06 DIAGNOSIS — I272 Pulmonary hypertension, unspecified: Secondary | ICD-10-CM

## 2019-07-06 DIAGNOSIS — Z23 Encounter for immunization: Secondary | ICD-10-CM

## 2019-07-06 DIAGNOSIS — J849 Interstitial pulmonary disease, unspecified: Secondary | ICD-10-CM

## 2019-07-06 DIAGNOSIS — E273 Drug-induced adrenocortical insufficiency: Secondary | ICD-10-CM

## 2019-07-06 DIAGNOSIS — T380X5A Adverse effect of glucocorticoids and synthetic analogues, initial encounter: Secondary | ICD-10-CM

## 2019-07-06 LAB — CBC WITH DIFFERENTIAL/PLATELET
Basophils Absolute: 0 10*3/uL (ref 0.0–0.1)
Basophils Relative: 0.9 % (ref 0.0–3.0)
Eosinophils Absolute: 0.1 10*3/uL (ref 0.0–0.7)
Eosinophils Relative: 2.2 % (ref 0.0–5.0)
HCT: 39.8 % (ref 36.0–46.0)
Hemoglobin: 13 g/dL (ref 12.0–15.0)
Lymphocytes Relative: 25.9 % (ref 12.0–46.0)
Lymphs Abs: 1.3 10*3/uL (ref 0.7–4.0)
MCHC: 32.8 g/dL (ref 30.0–36.0)
MCV: 87.8 fl (ref 78.0–100.0)
Monocytes Absolute: 0.3 10*3/uL (ref 0.1–1.0)
Monocytes Relative: 6.7 % (ref 3.0–12.0)
Neutro Abs: 3.3 10*3/uL (ref 1.4–7.7)
Neutrophils Relative %: 64.3 % (ref 43.0–77.0)
Platelets: 232 10*3/uL (ref 150.0–400.0)
RBC: 4.53 Mil/uL (ref 3.87–5.11)
RDW: 15.4 % (ref 11.5–15.5)
WBC: 5.2 10*3/uL (ref 4.0–10.5)

## 2019-07-06 LAB — COMPREHENSIVE METABOLIC PANEL
ALT: 17 U/L (ref 0–35)
AST: 21 U/L (ref 0–37)
Albumin: 4.2 g/dL (ref 3.5–5.2)
Alkaline Phosphatase: 137 U/L — ABNORMAL HIGH (ref 39–117)
BUN: 17 mg/dL (ref 6–23)
CO2: 31 mEq/L (ref 19–32)
Calcium: 9.7 mg/dL (ref 8.4–10.5)
Chloride: 99 mEq/L (ref 96–112)
Creatinine, Ser: 0.76 mg/dL (ref 0.40–1.20)
GFR: 75.91 mL/min (ref >60.00)
Glucose, Bld: 96 mg/dL (ref 70–99)
Potassium: 3.7 mEq/L (ref 3.5–5.1)
Sodium: 140 mEq/L (ref 135–145)
Total Bilirubin: 0.6 mg/dL (ref 0.2–1.2)
Total Protein: 7.1 g/dL (ref 6.0–8.3)

## 2019-07-06 LAB — TSH: TSH: 1.21 u[IU]/mL (ref 0.35–4.50)

## 2019-07-06 LAB — CORTISOL: Cortisol, Plasma: 7.1 ug/dL

## 2019-07-06 NOTE — Progress Notes (Signed)
[facility]  Chief Complaint  Patient presents with  . Sleep Apnea    Does get short of breath if walking her driveway which is uphill. Also when singing at church.    Constitutional:  BP 130/74 (BP Location: Right Arm, Patient Position: Sitting, Cuff Size: Large)   Pulse 66   Temp (!) 97.4 F (36.3 C)   Ht 5' 5.5" (1.664 m)   Wt 232 lb (105.2 kg)   SpO2 97%   BMI 38.02 kg/m   Past Medical History:  IBS, Cystitis, Hypothyroidism, HTN, GERD, DJD, Carpal tunnel, A fib, Lymphedema  Brief Summary:  Heather [name] is a 68 y.o. female with cryptogenic organizing pneumonia, nocturnal hypoxemia, pulmonary HTN, OSA, and restrictive lung disease.  She has been off prednisone since Labor day.  Still gets winded when walking up a hill.  Feels some fatigue.  Not having cough, wheeze, chest congestion, fever, sweats, or chest pain.  Still struggling with her weight.  Uses CPAP nightly.  No issues with mask fit.  Not having sinus congestion, sore throat, dry mouth, or aerophagia.  Uses 2 liters oxygen at night with CPAP and prn with exertion.  Hasn't needed to use albuterol recently.  Never got Echocardiogram schedule.    Has been using trazodone nightly.  This helps her sleep.   Physical Exam:   Appearance - well kempt   ENMT - no sinus tenderness, no nasal discharge, no oral exudate  Neck - no masses, trachea midline, no thyromegaly, no elevation in JVP  Respiratory - normal appearance of chest wall, normal respiratory effort w/o accessory muscle use, no dullness on percussion, no wheezing or rales  CV - s1s2 regular rate and rhythm, no murmurs, 2+ peripheral edema, radial pulses symmetric  GI - soft, non tender  Lymph - no adenopathy noted in neck and axillary areas  MSK - normal gait  Ext - no cyanosis, clubbing, or joint inflammation noted  Skin - no rashes, lesions, or ulcers  Neuro - normal strength, oriented x 3  Psych  - normal mood and affect  Assessment/Plan:   Interstitial lung disease. - she has serology positive for ACE and RNP - no residual ILD on CT chest from July 2020 - has been off prednisone since September 2020 - monitor clinically - high dose flu shot and pneumovax today  History of steroid use. - concern for adrenal insufficiency - check TSH, random cortisol, CMET, CBC with diff  Chronic respiratory failure with hypoxia. - uses 2 liters prn with exertion and with CPAP at night - will arrange for ONO with CPAP and then determine if she needs adjustment to nocturnal oxygen set up  Obstructive sleep apnea. - she is compliant with CPAP and reports benefit - continue auto CPAP with 2 liters at night pending ONO results  WHO group 2 and 3 pulmonary hypertension. - will make sure Echo is scheduled and forward results to her cardiologist  Insomnia after recently discontinuing tramadol. - continue elavil and trazodone at night    Patient Instructions  High dose flu shot and pneumonia 23 shots today.  Lab work today.  Will arrange for overnight oxygen test while using CPAP; do not use supplemental oxygen on the night you do overnight oxygen test.  Will arrange for echocardiogram.  Follow up in 6 months.    Coralyn Helling, MD [facility] Pager: [phone] 07/06/2019, 11:23 AM  Flow Sheet     Pulmonary tests:  PFT  12/16/13 >> FEV1 1.84 (72%), FEV1% 84, DLCO 74% Serology 01/30/17 >> RNP 6.5, ACE 76 postive; SCL 70, ANCA, SSA/SSB, ANA, anti CCP, RF negative  Chest imaging:  CT chest 08/27/18 >> diffuse GGO and interlobular septal thickening most in mid/lower lungs HRCT chest 05/02/19 >> mild atherosclerosis, CM, PA 4.1 cm, lungs clear  Sleep tests:  PSG 03/25/12 >> AHI 24.7, SpO2 low 76% Auto CPAP 10/16/18 to 11/14/18 >> used on 26 of 30 nights with average 8 hrs 33 min.  Average AHI 0.6 with median CPAP 9 and 95 th percentile CPAP 12 cm H2O HST 11/25/18 >>  AHI 23.7, SpO2 low 80% Auto CPAP 06/05/19 to 07/04/19 >> used on 30 of 30 nights with average 7 hrs 45 min.  Average AHI 1.6 with median CPAP 10 and 95 th percentile CPAP 12 cm H2O.  Cardiac tests:  Echo 01/06/18 >> EF 60 to 65%, mod TR, PAS 76 mmHg  Medications:   Allergies as of 07/06/2019      Reactions   Cephalexin Anaphylaxis, Hives   "throat started closing up"   Rocephin [ceftriaxone Sodium In Dextrose] Anaphylaxis   Penicillins Rash   Has patient had a PCN reaction causing immediate rash, facial/tongue/throat swelling, SOB or lightheadedness with hypotension: no Has patient had a PCN reaction causing severe rash involving mucus membranes or skin necrosis: no Has patient had a PCN reaction that required hospitalization no Has patient had a PCN reaction occurring within the last 10 years: no If all of the above answers are "NO", then may proceed with Cephalosporin use.   Flecainide Acetate Other (See Comments)   Jittery    Moxifloxacin    Does not remember the reaction   Sulfonamide Derivatives    Does not remember reaction ; "was so young when I had reaction to it"      Medication List       Accurate as of July 06, 2019 11:23 AM. If you have any questions, ask your nurse or doctor.        STOP taking these medications   predniSONE 1 MG tablet Commonly known as: DELTASONE Stopped by: Chesley Mires, MD     TAKE these medications   albuterol 108 (90 Base) MCG/ACT inhaler Commonly known as: VENTOLIN HFA INHALE 2 PUFFS INTO THE LUNGS EVERY 4 HOURS AS NEEDED FOR WHEEZING/SHORTNESS OF BREATH What changed: See the new instructions.   amitriptyline 10 MG tablet Commonly known as: ELAVIL Take 1 tablet (10 mg total) by mouth at bedtime.   BARIATRIC MULTIVITAMINS/IRON PO Take 1 tablet by mouth daily.   cetirizine 10 MG tablet Commonly known as: ZYRTEC Take 10 mg by mouth at bedtime.   clonazePAM 0.5 MG tablet Commonly known as: KLONOPIN TAKE 1/2 TO 1 TABLET BY  MOUTH TWICE A DAY What changed:   how much to take  how to take this  when to take this   diclofenac sodium 1 % Gel Commonly known as: VOLTAREN Apply 2 g topically 2 (two) times daily.   diltiazem 120 MG 24 hr capsule Commonly known as: CARDIZEM CD Take 120 mg by mouth daily.   dronedarone 400 MG tablet Commonly known as: MULTAQ Take 400 mg by mouth 2 (two) times daily with a meal.   Eliquis 5 MG Tabs tablet Generic drug: apixaban TAKE 1 TABLET BY MOUTH TWICE A DAY....NEED OFFICE VISIT BEFORE ANY MORE REFILLS What changed: See the new instructions.   furosemide 40 MG tablet Commonly known as: LASIX TAKE 2  TABLETS BY MOUTH EVERY MORNING   guaiFENesin 600 MG 12 hr tablet Commonly known as: MUCINEX Take 600 mg by mouth 2 (two) times daily as needed (allergies).   levothyroxine 75 MCG tablet Commonly known as: SYNTHROID Take 1 tablet (75 mcg total) by mouth daily before breakfast.   losartan 100 MG tablet Commonly known as: COZAAR TAKE 1 TABLET BY MOUTH  DAILY   LUBRICATING EYE DROPS OP Place 1 drop into both eyes daily as needed (dry eyes).   Magnesium 250 MG Tabs Take 250 mg by mouth 2 (two) times daily.   metoprolol tartrate 25 MG tablet Commonly known as: LOPRESSOR Take 12.5 mg by mouth 2 (two) times daily.   montelukast 10 MG tablet Commonly known as: SINGULAIR TAKE 1 TABLET BY MOUTH EVERYDAY AT BEDTIME   OXYGEN Inhale 2 L into the lungs continuous.   traZODone 50 MG tablet Commonly known as: DESYREL TAKE 1 TABLET (50 MG TOTAL) BY MOUTH AT BEDTIME AS NEEDED FOR SLEEP.   vitamin C 500 MG tablet Commonly known as: ASCORBIC ACID Take 500 mg by mouth daily.   Vitamin D 50 MCG (2000 UT) tablet Take 2,000 Units by mouth daily.       Past Surgical History:  She  has a past surgical history that includes Back surgery; Carpal tunnel release (1990's); LASIK; Cardiac catheterization; Laparoscopic gastric banding (2009); Cardiac electrophysiology mapping  and ablation (~ 2005 and 2007); Tonsillectomy and adenoidectomy (4935); Vaginal hysterectomy (2000); Tubal ligation (1980); Dilation and curettage of uterus (2000); Total hip arthroplasty (~2009; 2010); Joint replacement; Posterior fusion lumbar spine (2000's); Posterior fusion lumbar spine (2000's); Cardioversion (04/02/2012); Cardioversion (06/16/2012); Anterior and posterior repair (10/20/2012); Cardioversion (10/29/2012); TEE without cardioversion (N/A, 12/29/2012); Cardioversion (N/A, 03/30/2013); Cardioversion (N/A, 12/23/2013); atrial fibrillation ablation (N/A, 12/30/2012); Knee arthroscopy (Right, 11/18/2016); Laparoscopic gastric band removal with laparoscopic gastric sleeve resection (N/A, 05/24/2018); and Total knee arthroplasty (Right, 12/17/2018).  Family History:  Her family history includes Atrial fibrillation in her mother; Dementia in her father; Diabetes in her brother, father, and sister; Heart attack in an other family member; Heart disease in her maternal grandmother; Hypertension in her father.  Social History:  She  reports that she has never smoked. She has never used smokeless tobacco. She reports that she does not drink alcohol or use drugs.

## 2019-07-06 NOTE — Addendum Note (Signed)
Addended by: Suzzanne Cloud E on: 07/06/2019 11:28 AM   Modules accepted: Orders

## 2019-07-06 NOTE — Patient Instructions (Signed)
High dose flu shot and pneumonia 23 shots today.  Lab work today.  Will arrange for overnight oxygen test while using CPAP; do not use supplemental oxygen on the night you do overnight oxygen test.  Will arrange for echocardiogram.  Follow up in 6 months.

## 2019-07-11 ENCOUNTER — Telehealth: Payer: Self-pay | Admitting: Pulmonary Disease

## 2019-07-11 NOTE — Telephone Encounter (Signed)
Call returned to patient, she is requesting to know the results of her lab, TSH specifically. She is also inquiring as to whether her synthroid needs to be adjusted.   VS please advise of lab work. Also she is wanted to know if you will be managing her thyroid medication in the future. Thanks.

## 2019-07-11 NOTE — Telephone Encounter (Signed)
Called and spoke with Patient.  Dr. Halford Chessman results and recommendations given.  Understanding stated. Patient stated she is scheduled for ONO 07/12/19, and echo 07/13/19.   Patient requested Dr. Halford Chessman  refill levothyroxine since Dr. Lenna Gilford took care of that for her instead of her PCP.  Message routed to Dr. Halford Chessman to advise on levothyroxine refill.

## 2019-07-11 NOTE — Telephone Encounter (Signed)
Call returned to patient, made aware Dr. Halford Chessman is pulmonary only. Aware he is deferring to PCP for refills. Voiced understanding. Nothing further needed at his time.

## 2019-07-11 NOTE — Telephone Encounter (Signed)
She was to have ONO with CPAP and Echocardiogram.  I have not received ONO results yet, and doesn't look like Echo was completed yet.  TSH was normal, and remainder of her other lab tests were normal also.

## 2019-07-11 NOTE — Telephone Encounter (Signed)
Dr. Lenna Gilford did both primary care and pulmonary medicine.  I only specialize in pulmonary and sleep medicine.  It would be best to have her thyroid medication refilled by her PCP from now on since her PCP will the provider monitoring her thyroid disease clinically.

## 2019-07-13 ENCOUNTER — Telehealth: Payer: Self-pay | Admitting: Family Medicine

## 2019-07-13 ENCOUNTER — Telehealth: Payer: Self-pay | Admitting: Pulmonary Disease

## 2019-07-13 ENCOUNTER — Other Ambulatory Visit: Payer: Self-pay

## 2019-07-13 ENCOUNTER — Ambulatory Visit (HOSPITAL_COMMUNITY): Payer: Medicare Other | Attending: Cardiovascular Disease

## 2019-07-13 DIAGNOSIS — I272 Pulmonary hypertension, unspecified: Secondary | ICD-10-CM | POA: Diagnosis not present

## 2019-07-13 NOTE — Telephone Encounter (Signed)
Pt thinks office closes at 4.

## 2019-07-13 NOTE — Telephone Encounter (Signed)
Called patient's PCP office to have someone to get in touch with her in regards to this refill since Dr. Halford Chessman is not managing this. They will have someone to call her.   Spoke with patient. Advised her that I have called Dr. Samella Parr office for her for the refill. Also advised her that if she needs any refills outside of pulmonary, she would need to contact his office herself. She verbalized understanding. Nothing further needed.

## 2019-07-13 NOTE — Telephone Encounter (Signed)
Heather [name] from [place] called stating patient needs a refill on her synthroid called into [facility] on [address]. She states that they have advised patient that Dr. Dennard Schaumann would need to refill this medication.

## 2019-07-14 MED ORDER — LEVOTHYROXINE SODIUM 75 MCG PO TABS: 75.0000 ug | ORAL_TABLET | Freq: Every day | ORAL | 11 refills | Status: DC

## 2019-07-14 NOTE — Telephone Encounter (Signed)
Medication called/sent to requested pharmacy  

## 2019-07-14 NOTE — Telephone Encounter (Signed)
Tsh was normal. Ok to refill current dose.

## 2019-07-14 NOTE — Telephone Encounter (Signed)
OK to refill medication? - Dr. Halford Chessman drew a TSH but pt has not been seen for thyroid since 2016.

## 2019-07-15 ENCOUNTER — Telehealth: Payer: Self-pay | Admitting: Pulmonary Disease

## 2019-07-15 NOTE — Telephone Encounter (Signed)
ATC Patient.  LM for Patient to cal back for results.

## 2019-07-15 NOTE — Telephone Encounter (Signed)
ONO with CPAP 07/12/19 >> test time 5 hrs 6 min.  Baseline SpO2 92%, low SpO2 87%.  Spent 1 min 8 sec with SpO2 < 88%.  Echo 07/13/19 >> EF 60 to 65%, severe LA dilation, mod RA dilation, mod elevated PASP   Please let her know overnight oximetry looked good with CPAP.  She should continue CPAP at night, but doesn't need supplemental oxygen at night.  Please let her know her Echo results show improvement in pulmonary artery pressure readings compared to Echo from 01/06/18.

## 2019-07-18 NOTE — Telephone Encounter (Signed)
Pt returned call for test results 

## 2019-07-18 NOTE — Telephone Encounter (Signed)
Spoke with the pt and notified of results/recs per Dr Sood  She verbalized understanding  Nothing further needed 

## 2019-10-19 ENCOUNTER — Other Ambulatory Visit: Payer: Self-pay | Admitting: Family Medicine

## 2019-10-31 ENCOUNTER — Other Ambulatory Visit: Payer: Self-pay | Admitting: Family Medicine

## 2019-10-31 ENCOUNTER — Other Ambulatory Visit: Payer: Self-pay

## 2019-10-31 ENCOUNTER — Telehealth: Payer: Self-pay | Admitting: Pulmonary Disease

## 2019-10-31 ENCOUNTER — Telehealth (INDEPENDENT_AMBULATORY_CARE_PROVIDER_SITE_OTHER): Payer: Medicare Other | Admitting: Pulmonary Disease

## 2019-10-31 ENCOUNTER — Encounter: Payer: Self-pay | Admitting: Pulmonary Disease

## 2019-10-31 DIAGNOSIS — G4733 Obstructive sleep apnea (adult) (pediatric): Secondary | ICD-10-CM

## 2019-10-31 DIAGNOSIS — Z20822 Contact with and (suspected) exposure to covid-19: Secondary | ICD-10-CM | POA: Diagnosis not present

## 2019-10-31 DIAGNOSIS — I272 Pulmonary hypertension, unspecified: Secondary | ICD-10-CM

## 2019-10-31 NOTE — Telephone Encounter (Signed)
Called and spoke with pt who stated she has symptoms of cold with a cough and achiness. Pt does not have any fever as she said her temp was 97 last checked.  Pt states that she is not coughing up any mucus. Pt denies any complaints of loss of taste or loss of smell.  Stated to pt due to the mult family members that have tested positive and due to the symptoms she is having, we needed to schedule her for an appt. Pt verbalized understanding and has been scheduled for visit with Heather [name] at 12pm. Nothing further needed.

## 2019-10-31 NOTE — Patient Instructions (Addendum)
You were seen today by Coral Ceo, NP  For:   1. Close exposure to COVID-19 virus  - MYCHART COVID-19 HOME MONITORING PROGRAM; Future - Temperature monitoring; Future - Novel Coronavirus, NAA ([facility])  Will route to community [facility] testing pool to coordinate outpatient Covid testing for patient immediately  2. Pulmonary hypertension (HCC)  We will monitor clinically  3. OSA (obstructive sleep apnea)  We recommend that you continue using your CPAP daily >>>Keep up the hard work using your device >>> Goal should be wearing this for the entire night that you are sleeping, at least 4 to 6 hours  Remember:  . Do not drive or operate heavy machinery if tired or drowsy.  . Please notify the supply company and office if you are unable to use your device regularly due to missing supplies or machine being broken.  . Work on maintaining a healthy weight and following your recommended nutrition plan  . Maintain proper daily exercise and movement  . Maintaining proper use of your device can also help improve management of other chronic illnesses such as: Blood pressure, blood sugars, and weight management.   BiPAP/ CPAP Cleaning:  >>>Clean weekly, with Dawn soap, and bottle brush.  Set up to air dry. >>> Wipe mask out daily with wet wipe or towelette     We recommend today:  Orders Placed This Encounter  Procedures  . Novel Coronavirus, NAA ([facility])    Order Specific Question:   Is this test for diagnosis or screening    Answer:   Diagnosis of ill patient    Order Specific Question:   Symptomatic for COVID-19 as defined by CDC    Answer:   Yes    Order Specific Question:   Date of Symptom Onset    Answer:   10/29/2019    Order Specific Question:   Hospitalized for COVID-19    Answer:   No    Order Specific Question:   Admitted to ICU for COVID-19    Answer:   No    Order Specific Question:   Previously tested for COVID-19    Answer:   No    Order Specific Question:   Resident in  a congregate (group) care setting    Answer:   No    Order Specific Question:   Is the patient student?    Answer:   No    Order Specific Question:   Employed in healthcare setting    Answer:   No    Order Specific Question:   Pregnant    Answer:   No    Order Specific Question:   Release to patient    Answer:   Immediate   Orders Placed This Encounter  Procedures  . Novel Coronavirus, NAA ([facility])   No orders of the defined types were placed in this encounter.   Follow Up:    No follow-ups on file.   Please do your part to reduce the spread of COVID-19:      Reduce your risk of any infection  and COVID19 by using the similar precautions used for avoiding the common cold or flu:  Marland Kitchen Wash your hands often with soap and warm water for at least 20 seconds.  If soap and water are not readily available, use an alcohol-based hand sanitizer with at least 60% alcohol.  . If coughing or sneezing, cover your mouth and nose by coughing or sneezing into the elbow areas of your shirt or coat, into  a tissue or into your sleeve (not your hands). Langley Gauss A MASK when in public  . Avoid shaking hands with others and consider head nods or verbal greetings only. . Avoid touching your eyes, nose, or mouth with unwashed hands.  . Avoid close contact with people who are sick. . Avoid places or events with large numbers of people in one location, like concerts or sporting events. . If you have some symptoms but not all symptoms, continue to monitor at home and seek medical attention if your symptoms worsen. . If you are having a medical emergency, call 911.   [place] / e-Visit: [email or web address]         [facility]: [facility]: [phone]                   [facility]: [phone]     It is flu season:   >>> Best ways to protect  herself from the flu: Receive the yearly flu vaccine, practice good hand hygiene washing with soap and also using hand sanitizer when available, eat a nutritious meals, get adequate rest, hydrate appropriately   Please contact the office if your symptoms worsen or you have concerns that you are not improving.   Thank you for choosing [facility] for your healthcare, and for allowing [place] to partner with you on your healthcare journey. I am thankful to be able to provide care to you today.   Wyn Quaker FNP-C

## 2019-10-31 NOTE — Assessment & Plan Note (Signed)
Plan: We will continue to monitor clinically We will have 1 week follow-up Recommending outpatient Covid testing

## 2019-10-31 NOTE — Assessment & Plan Note (Signed)
Plan: Continue CPAP therapy 

## 2019-10-31 NOTE — Progress Notes (Addendum)
Virtual Visit via Video Note  I connected with Heather [name] on 10/31/19 at 12:00 PM EST by a video enabled telemedicine application and verified that I am speaking with the correct person using two identifiers.  Location: Patient: Home Provider: Office - [facility] - [address] [place], [address], [place], [place] [address]  I discussed the limitations of evaluation and management by telemedicine and the availability of in person appointments. The patient expressed understanding and agreed to proceed. I also discussed with the patient that there may be a patient responsible charge related to this service. The patient expressed understanding and agreed to proceed.  Patient consented to consult via telephone: Yes People present and their role in pt care: Pt   History of Present Illness:  69 year old female never smoker followed in our office for obstructive sleep apnea, dyspnea on exertion, hx of ild  Past medical history: Hypertension, A. fib, obesity, thyroid disease Smoking history: Never smoker Maintenance: Patient of Dr. Craige Cotta  Chief complaint: Concern   69 year old female never smoker followed in our office for history of interstitial lung disease, pulmonary hypertension, obstructive sleep apnea and dyspnea on exertion.  Patient contacted our office on 10/31/2019 reporting that her husband who she lives with is positive for Covid.  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  Husband and grandson were both tested on 10/27/2019.  Patient reports that on 10/29/2019 she started develop symptoms of sinus congestion, cough and body aches.  She denies any fevers.  Oxygen levels are stable and at baseline.  Observations/Objective:  Pulmonary tests:  PFT 12/16/13 >> FEV1 1.84 (72%), FEV1% 84, DLCO 74% Serology 01/30/17 >> RNP 6.5, ACE 76 postive; SCL 70, ANCA, SSA/SSB, ANA, anti CCP, RF negative  Chest imaging:  CT chest 08/27/18 >> diffuse GGO and interlobular septal  thickening most in mid/lower lungs HRCT chest 05/02/19 >> mild atherosclerosis, CM, PA 4.1 cm, lungs clear  Sleep tests:  PSG 03/25/12 >> AHI 24.7, SpO2 low 76% Auto CPAP 10/16/18 to 11/14/18 >> used on 26 of 30 nights with average 8 hrs 33 min.  Average AHI 0.6 with median CPAP 9 and 95 th percentile CPAP 12 cm H2O HST 11/25/18 >> AHI 23.7, SpO2 low 80% Auto CPAP 06/05/19 to 07/04/19 >> used on 30 of 30 nights with average 7 hrs 45 min.  Average AHI 1.6 with median CPAP 10 and 95 th percentile CPAP 12 cm H2O.  Cardiac tests:  Echo 01/06/18 >> EF 60 to 65%, mod TR, PAS 76 mmHg  Social History   Tobacco Use  Smoking Status Never Smoker  Smokeless Tobacco Never Used   Immunization History  Administered Date(s) Administered  . Fluad Quad(high Dose 65+) 07/06/2019  . Influenza Split 07/13/2016  . Influenza, High Dose Seasonal PF 08/11/2018  . Influenza,inj,Quad PF,6+ Mos 06/30/2017  . Influenza-Unspecified 11/04/2012, 08/24/2014  . Pneumococcal Conjugate-13 08/11/2018  . Pneumococcal Polysaccharide-23 07/06/2019  . 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 Pt aware.     Follow Up  Instructions:  Return in about 1 week (around 11/07/2019), or if symptoms worsen or fail to improve, for Follow up with Dr. Halford Chessman, Follow up with Wyn Quaker FNP-C.    I discussed the assessment and treatment plan with the patient. The patient was provided an opportunity to ask questions and all were answered. The patient agreed with the plan and demonstrated an understanding of the instructions.   The patient was advised to call back or seek an in-person evaluation if the symptoms worsen or if the condition fails to improve as anticipated.  I provided 24 minutes of non-face-to-face time during this encounter.   Lauraine Rinne, NP

## 2019-10-31 NOTE — Progress Notes (Signed)
Reviewed and agree with assessment/plan.   Livianna Petraglia, MD [facility] 10/08/2016, 12:24 PM Pager:  [phone]  

## 2019-10-31 NOTE — Assessment & Plan Note (Signed)
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

## 2019-11-01 ENCOUNTER — Ambulatory Visit: Payer: Medicare Other | Attending: Internal Medicine

## 2019-11-01 ENCOUNTER — Other Ambulatory Visit: Payer: Self-pay

## 2019-11-01 DIAGNOSIS — Z20822 Contact with and (suspected) exposure to covid-19: Secondary | ICD-10-CM

## 2019-11-02 LAB — NOVEL CORONAVIRUS, NAA: SARS-CoV-2, NAA: DETECTED — AB

## 2019-11-03 ENCOUNTER — Other Ambulatory Visit: Payer: Self-pay | Admitting: Pulmonary Disease

## 2019-11-03 ENCOUNTER — Telehealth: Payer: Self-pay | Admitting: Pulmonary Disease

## 2019-11-03 DIAGNOSIS — I272 Pulmonary hypertension, unspecified: Secondary | ICD-10-CM

## 2019-11-03 DIAGNOSIS — G4733 Obstructive sleep apnea (adult) (pediatric): Secondary | ICD-10-CM

## 2019-11-03 DIAGNOSIS — J849 Interstitial pulmonary disease, unspecified: Secondary | ICD-10-CM

## 2019-11-03 DIAGNOSIS — U071 COVID-19: Secondary | ICD-10-CM

## 2019-11-03 NOTE — Telephone Encounter (Signed)
11/03/19  I connected by phone with Heather [name] on 11/03/2019 at 8:30 AM to discuss the potential use of an new treatment for mild to moderate COVID-19 viral infection in non-hospitalized patients.  This patient is a 69 y.o. female that meets the FDA criteria for Emergency Use Authorization of bamlanivimab or casirivimab\imdevimab.  Has a (+) direct SARS-CoV-2 viral test result  Has mild or moderate COVID-19   Is ? 69 years of age and weighs ? 40 kg  Is NOT hospitalized due to COVID-19  Is NOT requiring oxygen therapy or requiring an increase in baseline oxygen flow rate due to COVID-19  Is within 10 days of symptom onset  Has at least one of the high risk factor(s) for progression to severe COVID-19 and/or hospitalization as defined in EUA.  Specific high risk criteria : >/= 68 yo   I have spoken and communicated the following to the patient or parent/caregiver:  1. FDA has authorized the emergency use of bamlanivimab and casirivimab\imdevimab for the treatment of mild to moderate COVID-19 in adults and pediatric patients with positive results of direct SARS-CoV-2 viral testing who are 41 years of age and older weighing at least 40 kg, and who are at high risk for progressing to severe COVID-19 and/or hospitalization.  2. The significant known and potential risks and benefits of bamlanivimab and casirivimab\imdevimab, and the extent to which such potential risks and benefits are unknown.  3. Information on available alternative treatments and the risks and benefits of those alternatives, including clinical trials.  4. Patients treated with bamlanivimab and casirivimab\imdevimab should continue to self-isolate and use infection control measures (e.g., wear mask, isolate, social distance, avoid sharing personal items, clean and disinfect "high touch" surfaces, and frequent handwashing) according to CDC guidelines.   5. The patient or parent/caregiver has the option to accept or  refuse bamlanivimab or casirivimab\imdevimab .  After reviewing this information with the patient, The patient agreed to proceed with receiving the bamlanimivab infusion and will be provided a copy of the Fact sheet prior to receiving the infusion..  Pt scheduled for 11/06/19 at 0830 for infusion. Directions given.   Note routed to Dr. Craige Cotta and PCP as Lorain Childes.  Coral Ceo 11/03/2019 8:30 AM

## 2019-11-03 NOTE — Telephone Encounter (Signed)
Noted.  Coralyn Helling, MD [facility] 11/03/2019, 1:08 PM

## 2019-11-03 NOTE — Progress Notes (Signed)
11/03/19  I connected by phone with Heather [name] Full on 11/03/2019 at 8:30 AM to discuss the potential use of an new treatment for mild to moderate COVID-19 viral infection in non-hospitalized patients.  This patient is a 69 y.o. female that meets the FDA criteria for Emergency Use Authorization of bamlanivimab or casirivimab\imdevimab.  Has a (+) direct SARS-CoV-2 viral test result  Has mild or moderate COVID-19   Is ? 69 years of age and weighs ? 40 kg  Is NOT hospitalized due to COVID-19  Is NOT requiring oxygen therapy or requiring an increase in baseline oxygen flow rate due to COVID-19  Is within 10 days of symptom onset  Has at least one of the high risk factor(s) for progression to severe COVID-19 and/or hospitalization as defined in EUA.  Specific high risk criteria : >/= 68 yo   I have spoken and communicated the following to the patient or parent/caregiver:  1. FDA has authorized the emergency use of bamlanivimab and casirivimab\imdevimab for the treatment of mild to moderate COVID-19 in adults and pediatric patients with positive results of direct SARS-CoV-2 viral testing who are 50 years of age and older weighing at least 40 kg, and who are at high risk for progressing to severe COVID-19 and/or hospitalization.  2. The significant known and potential risks and benefits of bamlanivimab and casirivimab\imdevimab, and the extent to which such potential risks and benefits are unknown.  3. Information on available alternative treatments and the risks and benefits of those alternatives, including clinical trials.  4. Patients treated with bamlanivimab and casirivimab\imdevimab should continue to self-isolate and use infection control measures (e.g., wear mask, isolate, social distance, avoid sharing personal items, clean and disinfect "high touch" surfaces, and frequent handwashing) according to CDC guidelines.   5. The patient or parent/caregiver has the option to accept or  refuse bamlanivimab or casirivimab\imdevimab .  After reviewing this information with the patient, The patient agreed to proceed with receiving the bamlanimivab infusion and will be provided a copy of the Fact sheet prior to receiving the infusion..  Pt scheduled for 11/06/19 at 0830 for infusion. Directions given.   Coral Ceo 11/03/2019 8:30 AM

## 2019-11-06 ENCOUNTER — Other Ambulatory Visit: Payer: Self-pay

## 2019-11-06 ENCOUNTER — Ambulatory Visit (HOSPITAL_COMMUNITY)
Admission: RE | Admit: 2019-11-06 | Discharge: 2019-11-06 | Disposition: A | Discharge status: Home / Self Care | Payer: Medicare Other | Source: Ambulatory Visit | Attending: Pulmonary Disease | Admitting: Pulmonary Disease

## 2019-11-06 DIAGNOSIS — Z23 Encounter for immunization: Secondary | ICD-10-CM | POA: Insufficient documentation

## 2019-11-06 DIAGNOSIS — U071 COVID-19: Secondary | ICD-10-CM

## 2019-11-06 MED ORDER — SODIUM CHLORIDE 0.9 % IV SOLN
700.0000 mg | Freq: Once | INTRAVENOUS | Status: AC
  Administered 2019-11-06: 700 mg via INTRAVENOUS
  Filled 2019-11-06: qty 20

## 2019-11-06 MED ORDER — EPINEPHRINE 0.3 MG/0.3ML IJ SOAJ: 0.3000 mg | Freq: Once | INTRAMUSCULAR | Status: DC | PRN

## 2019-11-06 MED ORDER — ALBUTEROL SULFATE HFA 108 (90 BASE) MCG/ACT IN AERS: 2.0000 | INHALATION_SPRAY | Freq: Once | RESPIRATORY_TRACT | Status: DC | PRN

## 2019-11-06 MED ORDER — FAMOTIDINE IN NACL 20-0.9 MG/50ML-% IV SOLN: 20.0000 mg | Freq: Once | INTRAVENOUS | Status: DC | PRN

## 2019-11-06 MED ORDER — DIPHENHYDRAMINE HCL 50 MG/ML IJ SOLN: 50.0000 mg | Freq: Once | INTRAMUSCULAR | Status: DC | PRN

## 2019-11-06 MED ORDER — SODIUM CHLORIDE 0.9 % IV SOLN
INTRAVENOUS | Status: DC | PRN
  Administered 2019-11-06: 09:00:00 250 mL via INTRAVENOUS

## 2019-11-06 MED ORDER — METHYLPREDNISOLONE SODIUM SUCC 125 MG IJ SOLR: 125.0000 mg | Freq: Once | INTRAMUSCULAR | Status: DC | PRN

## 2019-11-06 NOTE — Progress Notes (Signed)
  Diagnosis: COVID-19  Physician: DR. Delford Field   Procedure: Covid Infusion Clinic Med: bamlanivimab infusion - Provided patient with bamlanimivab fact sheet for patients, parents and caregivers prior to infusion.  Complications: No immediate complications noted.  Discharge: Discharged home   Heather [name] 11/06/2019

## 2019-11-06 NOTE — Discharge Instructions (Signed)

## 2019-11-17 ENCOUNTER — Other Ambulatory Visit: Payer: Self-pay | Admitting: Primary Care

## 2019-12-25 IMAGING — DX DG KNEE 1-2V PORT*R*
2 series · 2 of 2 positions shown · non-contrast
Comparison: Portable exam 9583 hours compared to 11/24/2018

CLINICAL DATA: Post RIGHT total knee arthroplasty

EXAM:
PORTABLE RIGHT KNEE - 1-2 VIEW

[knee ap]
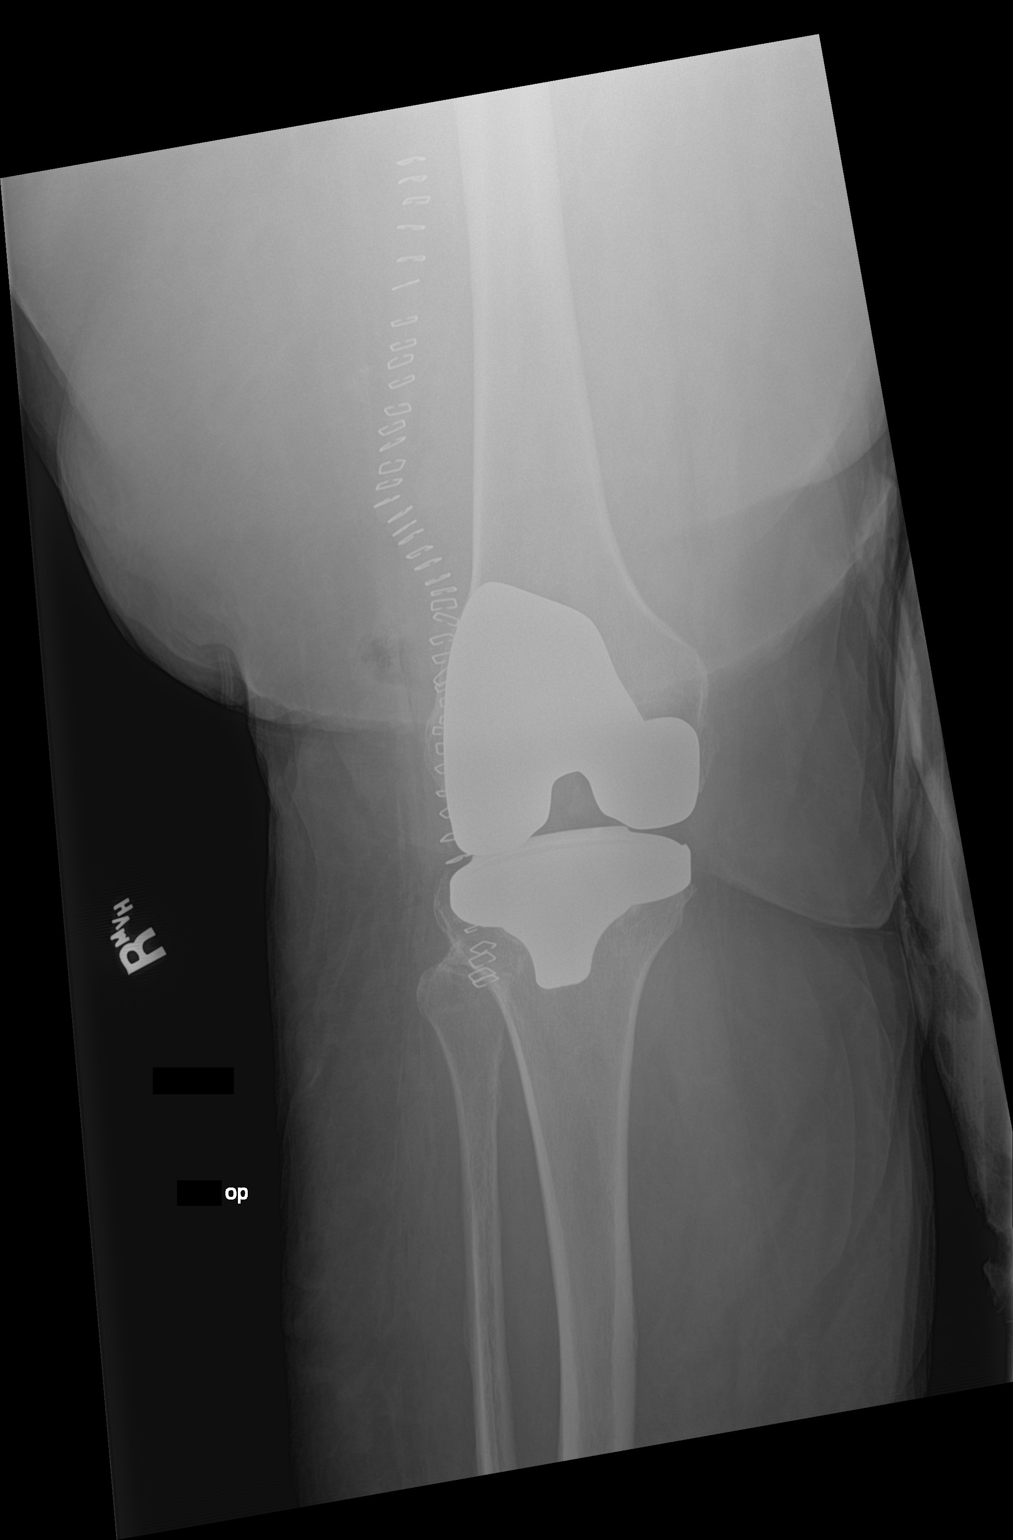

[knee lat]
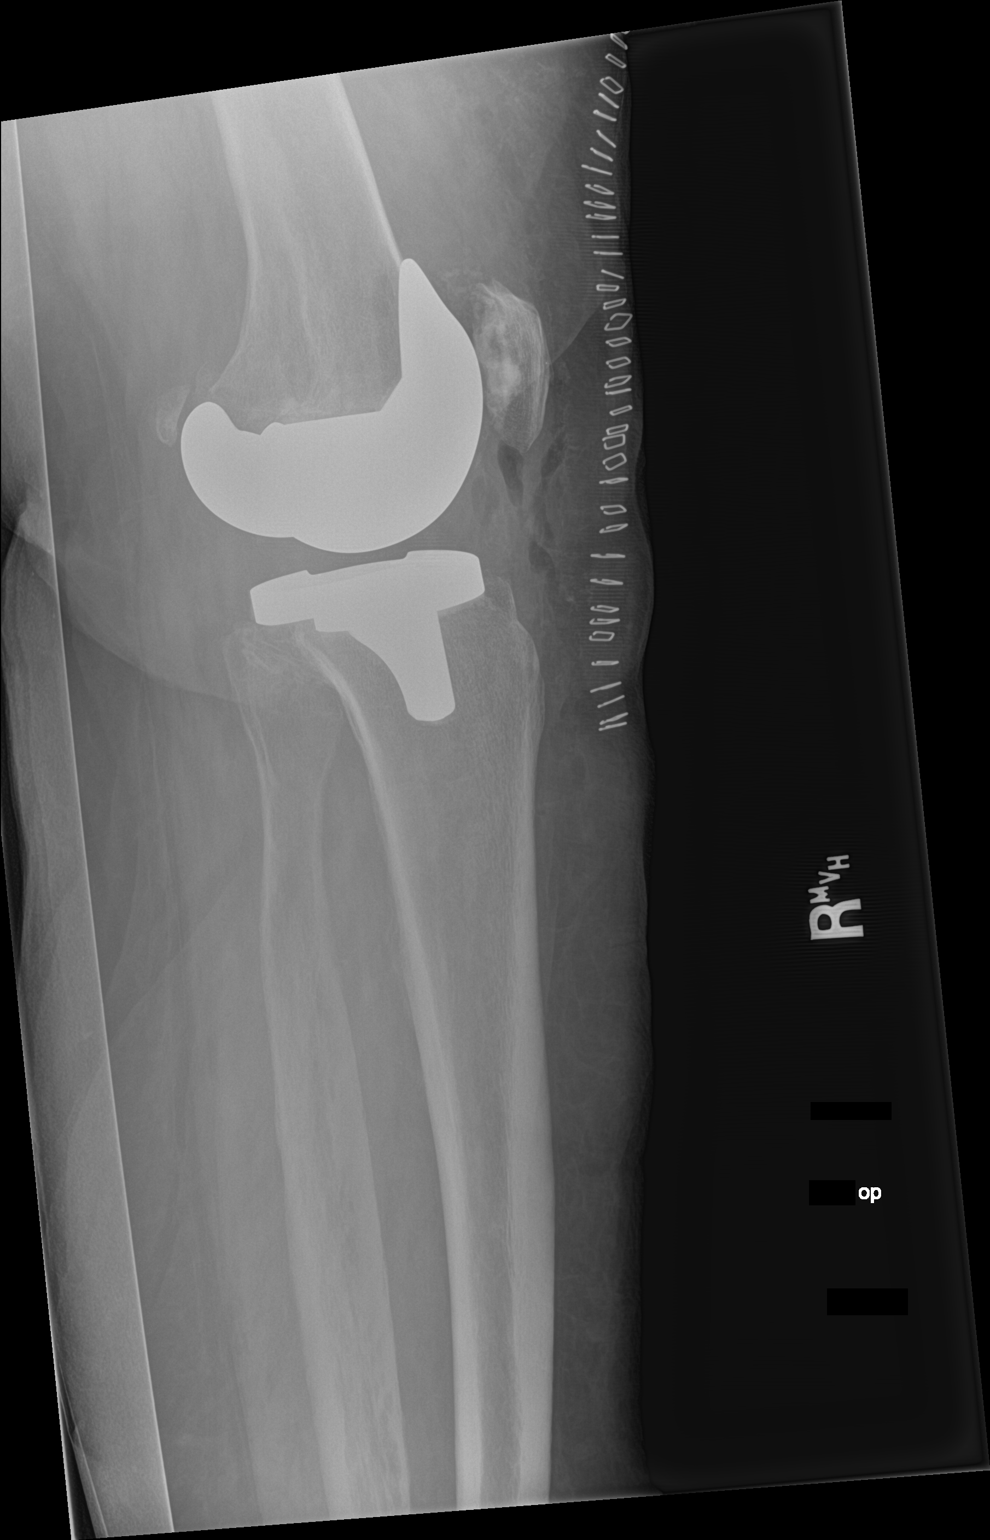

[2 of 2 positions shown; findings below may reference images not displayed]

FINDINGS: Osseous demineralization.

New components of a RIGHT knee prosthesis are identified in expected
positions.

No acute fracture, dislocation, bone destruction or periprosthetic
lucency.

Anterior skin clips and postsurgical soft tissue changes.
IMPRESSION: RIGHT knee prosthesis without acute complication.

## 2019-12-29 ENCOUNTER — Ambulatory Visit: Payer: Medicare Other | Admitting: Orthopaedic Surgery

## 2020-01-02 ENCOUNTER — Ambulatory Visit (INDEPENDENT_AMBULATORY_CARE_PROVIDER_SITE_OTHER): Payer: Medicare Other | Admitting: Orthopaedic Surgery

## 2020-01-02 ENCOUNTER — Encounter: Payer: Self-pay | Admitting: Orthopaedic Surgery

## 2020-01-02 ENCOUNTER — Other Ambulatory Visit: Payer: Self-pay

## 2020-01-02 ENCOUNTER — Ambulatory Visit: Payer: Self-pay

## 2020-01-02 DIAGNOSIS — Z96651 Presence of right artificial knee joint: Secondary | ICD-10-CM

## 2020-01-02 NOTE — Progress Notes (Signed)
The patient is a very pleasant and active 69 year old female that I have known for years.  We have replaced both her hips and more recently replaced her right knee.  The knee was replaced 1 year ago.  She has no issues with it at all.  She used to be on chronic oxygen and she is not on oxygen at all.  Her weight is down to 232 pounds.  She only really has big thighs but everything else is small.  She has no complaints with her right knee or left knee or her hips.  On examination of her right operative knee her range of motion is full.  Her knee is ligamentously stable.  2 views of the right knee are obtained and show well-seated total knee arthroplasty with no complicating features or malalignment.  There is no evidence of loosening.  A long thorough discussion about her joints.  If she has any issues with these at any time she knows to come see [place].  All questions concerns were answered and addressed.  Follow-up as otherwise as needed.

## 2020-02-09 ENCOUNTER — Other Ambulatory Visit: Payer: Self-pay | Admitting: Pulmonary Disease

## 2020-02-20 ENCOUNTER — Encounter: Payer: Self-pay | Admitting: Physician Assistant

## 2020-02-20 ENCOUNTER — Other Ambulatory Visit: Payer: Self-pay

## 2020-02-20 ENCOUNTER — Ambulatory Visit (INDEPENDENT_AMBULATORY_CARE_PROVIDER_SITE_OTHER): Payer: Medicare Other | Admitting: Physician Assistant

## 2020-02-20 DIAGNOSIS — M65312 Trigger thumb, left thumb: Secondary | ICD-10-CM

## 2020-02-20 MED ORDER — METHYLPREDNISOLONE ACETATE 40 MG/ML IJ SUSP
20.0000 mg | INTRAMUSCULAR | Status: AC | PRN
  Administered 2020-02-20: 20 mg

## 2020-02-20 MED ORDER — LIDOCAINE HCL 1 % IJ SOLN
0.5000 mL | INTRAMUSCULAR | Status: AC | PRN
  Administered 2020-02-20: .5 mL

## 2020-02-20 NOTE — Progress Notes (Signed)
   Procedure Note  Patient: Heather [name]             Date of Birth: Apr 01, 1951           MRN: [id number]             Visit Date: 02/20/2020 HPI: [name] comes in today due to left thumb triggering.  Is been ongoing for the past 3 weeks no injury.  No previous triggering.  Worse in the mornings.  She is left-hand dominant. Patient is nondiabetic. Physical exam: General well-developed well-nourished female no acute distress mood affect appropriate. Left thumb tenderness over the A1 pulley with palpable nodule.  Active triggering of the left thumb.  Remainder fingers left hand without triggering.  Procedures: Visit Diagnoses:  1. Trigger thumb, left thumb     Hand/UE Inj: L thumb A1 for trigger finger on 02/20/2020 10:41 AM Details: volar approach Medications: 0.5 mL lidocaine 1 %; 20 mg methylPREDNISolone acetate 40 MG/ML Consent was given by the patient. Immediately prior to procedure a time out was called to verify the correct patient, procedure, equipment, support staff and site/side marked as required. Patient was prepped and draped in the usual sterile fashion.     Plan: We will have her apply Voltaren gel up to 3 times daily to her thumb.  Follow-up with [place] as needed.  Questions were encouraged and answered

## 2020-03-06 ENCOUNTER — Other Ambulatory Visit: Payer: Self-pay | Admitting: Pulmonary Disease

## 2020-03-06 NOTE — Telephone Encounter (Signed)
Dr. Sood, please advise. 

## 2020-03-26 ENCOUNTER — Other Ambulatory Visit: Payer: Self-pay | Admitting: Family Medicine

## 2020-04-09 ENCOUNTER — Telehealth: Payer: Self-pay | Admitting: Family Medicine

## 2020-04-09 ENCOUNTER — Other Ambulatory Visit: Payer: Self-pay

## 2020-04-09 NOTE — Telephone Encounter (Signed)
Patient is requesting a refill on her hyoscyamine sulf she states this is for irritable bowel. [facility] uses [facility] [address]  CB# [phone]

## 2020-04-10 NOTE — Telephone Encounter (Signed)
Pt does not have this medication in her meds chart that she's asking for.

## 2020-04-13 ENCOUNTER — Other Ambulatory Visit: Payer: Self-pay | Admitting: Family Medicine

## 2020-04-20 ENCOUNTER — Other Ambulatory Visit: Payer: Self-pay | Admitting: Family Medicine

## 2020-04-24 ENCOUNTER — Other Ambulatory Visit: Payer: Self-pay | Admitting: Family Medicine

## 2020-04-30 ENCOUNTER — Other Ambulatory Visit: Payer: Self-pay

## 2020-04-30 ENCOUNTER — Ambulatory Visit (INDEPENDENT_AMBULATORY_CARE_PROVIDER_SITE_OTHER): Payer: Medicare Other | Admitting: Family Medicine

## 2020-04-30 VITALS — BP 102/60 | HR 55 | Temp 97.7°F | Ht 65.0 in | Wt 250.0 lb

## 2020-04-30 DIAGNOSIS — Z0000 Encounter for general adult medical examination without abnormal findings: Secondary | ICD-10-CM

## 2020-04-30 DIAGNOSIS — I10 Essential (primary) hypertension: Secondary | ICD-10-CM

## 2020-04-30 DIAGNOSIS — I480 Paroxysmal atrial fibrillation: Secondary | ICD-10-CM | POA: Diagnosis not present

## 2020-04-30 DIAGNOSIS — Z0001 Encounter for general adult medical examination with abnormal findings: Secondary | ICD-10-CM | POA: Diagnosis not present

## 2020-04-30 DIAGNOSIS — I2729 Other secondary pulmonary hypertension: Secondary | ICD-10-CM | POA: Diagnosis not present

## 2020-04-30 DIAGNOSIS — E038 Other specified hypothyroidism: Secondary | ICD-10-CM

## 2020-04-30 DIAGNOSIS — J4540 Moderate persistent asthma, uncomplicated: Secondary | ICD-10-CM

## 2020-04-30 DIAGNOSIS — J849 Interstitial pulmonary disease, unspecified: Secondary | ICD-10-CM

## 2020-04-30 LAB — TSH: TSH: 2.58 mIU/L (ref 0.40–4.50)

## 2020-04-30 MED ORDER — HYOSCYAMINE SULFATE 0.125 MG PO TABS: 0.1250 mg | ORAL_TABLET | ORAL | 0 refills | Status: DC | PRN

## 2020-04-30 NOTE — Progress Notes (Signed)
Subjective:    Patient ID: Heather [name], female    DOB: 07-06-1951, 69 y.o.   MRN: [id number]  HPI  Patient is here today for complete physical exam.  She states that her last colonoscopy was less than 10 years ago.  Is not due again for 7 years per her report.  She has a history of a hysterectomy and therefore does not require Pap smear.  She is overdue for mammogram.  Her last bone density test was performed 2 years ago at her gynecologist office and is therefore up-to-date.  Her immunization records are included below.  She is due for the Covid vaccine.  She is also due for Shingrix.  She politely declines a tetanus shot.  She is due for a flu shot this fall.  She denies any falls or depression or memory loss.  She does report severe fatigue.  She attributes this to problems with her thyroid medication however her blood pressure today is very low.  With substantial weight loss I question if she may be hypotensive.  Past Medical History:  Diagnosis Date  . Anemia    "onc;e; had to take iron for awhile"  . Atrial fibrillation (HCC)    Began in 2004.  Had ablations at [facility] in 2005 and 2007.  She is off coumadin  now with no noted recurrent atrial fibrillation since 2007. til 04/01/12; s/p initiation of Rhythmol with DCCV June 2013; reports 05-18-18 that she is in aflutter   . Carpal tunnel syndrome   . Degenerative disk disease   . GERD (gastroesophageal reflux disease)    resolved after lap band  . History of blood transfusion    "w/both hip replacements"  . HTN (hypertension)   . Hypothyroidism   . IBS (irritable bowel syndrome)   . Interstitial cystitis   . Morton's neuroma    right foot  . Obesity   . On home oxygen therapy    2L continuous daytime; at night uses 3L with CPAP at nighttime   . Pneumonitis dx 1 year ago   cryptogenic organizing pneumonitis; treated with prednisone   . Pulmonary hypertension (HCC)   . Shortness of breath    only without oxygen  . Sleep  apnea 04/01/12   "dx'd just last week"  . Sleep apnea    uses CPAP, 11 CWP with residual AHI 6.3   Past Surgical History:  Procedure Laterality Date  . ANTERIOR AND POSTERIOR REPAIR  10/20/2012   Procedure: ANTERIOR (CYSTOCELE) AND POSTERIOR REPAIR (RECTOCELE);  Surgeon: Esmeralda Arthur, MD;  Location: [facility] ORS;  Service: Gynecology;  Laterality: N/A;  2 hours  . ATRIAL FIBRILLATION ABLATION N/A 12/30/2012   Procedure: ATRIAL FIBRILLATION ABLATION;  Surgeon: Hillis Range, MD;  Location: [facility] CATH LAB;  Service: Cardiovascular;  Laterality: N/A;  . BACK SURGERY    . CARDIAC CATHETERIZATION    . CARDIAC ELECTROPHYSIOLOGY MAPPING AND ABLATION  ~ 2005 and 2007   "did more 2nd time; both at [facility]"  . CARDIOVERSION  04/02/2012   Procedure: CARDIOVERSION;  Surgeon: Laurey Morale, MD;  Location: [facility] OR;  Service: Cardiovascular;  Laterality: N/A;  . CARDIOVERSION  06/16/2012   Procedure: CARDIOVERSION;  Surgeon: [name], MD;  Location: [facility] ENDOSCOPY;  Service: Cardiovascular;  Laterality: N/A;  amanda/ebp/Beverly( or scheduling)  . CARDIOVERSION  10/29/2012   Procedure: CARDIOVERSION;  Surgeon: Laurey Morale, MD;  Location: [facility] ENDOSCOPY;  Service: Cardiovascular;  Laterality: N/A;  . CARDIOVERSION N/A 03/30/2013  Procedure: CARDIOVERSION;  Surgeon: Laurey Morale, MD;  Location: [facility] ENDOSCOPY;  Service: Cardiovascular;  Laterality: N/A;  . CARDIOVERSION N/A 12/23/2013   Procedure: CARDIOVERSION;  Surgeon: Lars Masson, MD;  Location: [facility] ENDOSCOPY;  Service: Cardiovascular;  Laterality: N/A;  9:27 Propofol 70mg , IV   150 joules synched shock by Dr. @ 150 joules...SR   post 12 lead ordered.   . CARPAL TUNNEL RELEASE  1990's   bilaterally  . DILATION AND CURETTAGE OF UTERUS  2000  . JOINT REPLACEMENT     bilateral hips  . KNEE ARTHROSCOPY Right 11/18/2016   Procedure: RIGHT KNEE ARTHROSCOPY WITH DEBRIDEMENT;  Surgeon: 01/16/2017, MD;  Location: [facility] OR;  Service:  Orthopedics;  Laterality: Right;  . LAPAROSCOPIC GASTRIC BAND REMOVAL WITH LAPAROSCOPIC GASTRIC SLEEVE RESECTION N/A 05/24/2018   Procedure: LAPAROSCOPIC GASTRIC BAND REMOVAL WITH LAPAROSCOPIC , GASTRIC SLEEVE RESECTION, UPPER ENDO, ERAS Pathway;  Surgeon: 07/24/2018, MD;  Location: [facility] ORS;  Service: General;  Laterality: N/A;  . LAPAROSCOPIC GASTRIC BANDING  2009  . LASIK    . POSTERIOR FUSION LUMBAR SPINE  2000's   "nerve problems"  . POSTERIOR FUSION LUMBAR SPINE  2000's  . TEE WITHOUT CARDIOVERSION N/A 12/29/2012   Procedure: TRANSESOPHAGEAL ECHOCARDIOGRAM (TEE);  Surgeon: Peter M 12/31/2012, MD;  Location: [facility] ENDOSCOPY;  Service: Cardiovascular;  Laterality: N/A;  . TONSILLECTOMY AND ADENOIDECTOMY  1957  . TOTAL HIP ARTHROPLASTY  ~2009; 2010   right; left  . TOTAL KNEE ARTHROPLASTY Right 12/17/2018   Procedure: RIGHT TOTAL KNEE ARTHROPLASTY;  Surgeon: 02/16/2019, MD;  Location: [facility] ORS;  Service: Orthopedics;  Laterality: Right;  . TUBAL LIGATION  1980  . VAGINAL HYSTERECTOMY  2000   Current Outpatient Medications on File Prior to Visit  Medication Sig Dispense Refill  . albuterol (PROVENTIL HFA;VENTOLIN HFA) 108 (90 Base) MCG/ACT inhaler INHALE 2 PUFFS INTO THE LUNGS EVERY 4 HOURS AS NEEDED FOR WHEEZING/SHORTNESS OF BREATH (Patient taking differently: Inhale 2 puffs into the lungs every 4 (four) hours as needed for wheezing or shortness of breath. ) 8.5 Inhaler 0  . amitriptyline (ELAVIL) 10 MG tablet TAKE 1 TABLET BY MOUTH EVERYDAY AT BEDTIME 90 tablet 1  . Carboxymethylcellul-Glycerin (LUBRICATING EYE DROPS OP) Place 1 drop into both eyes daily as needed (dry eyes).    . cetirizine (ZYRTEC) 10 MG tablet Take 10 mg by mouth at bedtime.     . Cholecalciferol (VITAMIN D) 2000 UNITS tablet Take 2,000 Units by mouth daily.    . clonazePAM (KLONOPIN) 0.5 MG tablet TAKE 1/2 TO 1 TABLET BY MOUTH TWICE A DAY (Patient taking differently: Take 0.25 mg by mouth 2 (two) times daily. TAKE  1/2 TO 1 TABLET BY MOUTH TWICE A DAY) 60 tablet 3  . diclofenac sodium (VOLTAREN) 1 % GEL Apply 2 g topically 2 (two) times daily. 2 Tube 2  . diltiazem (CARDIZEM CD) 120 MG 24 hr capsule Take 120 mg by mouth daily.     2001 dronedarone (MULTAQ) 400 MG tablet Take 400 mg by mouth 2 (two) times daily with a meal.    . ELIQUIS 5 MG TABS tablet TAKE 1 TABLET BY MOUTH TWICE A DAY....NEED OFFICE VISIT BEFORE ANY MORE REFILLS (Patient taking differently: Take 5 mg by mouth 2 (two) times daily. ) 60 tablet 6  . furosemide (LASIX) 40 MG tablet TAKE 2 TABLETS BY MOUTH EVERY DAY IN THE MORNING 180 tablet 1  . guaiFENesin (MUCINEX) 600 MG 12 hr tablet Take  600 mg by mouth 2 (two) times daily as needed (allergies).     Marland Kitchen levothyroxine (SYNTHROID) 75 MCG tablet TAKE 1 TABLET BY MOUTH EVERY DAY BEFORE BREAKFAST 90 tablet 3  . losartan (COZAAR) 100 MG tablet TAKE 1 TABLET BY MOUTH  DAILY (Patient taking differently: Take 100 mg by mouth daily. ) 90 tablet 1  . Magnesium 250 MG TABS Take 250 mg by mouth 2 (two) times daily.    . metoprolol tartrate (LOPRESSOR) 25 MG tablet Take 12.5 mg by mouth 2 (two) times daily.   11  . montelukast (SINGULAIR) 10 MG tablet TAKE 1 TABLET BY MOUTH EVERYDAY AT BEDTIME 90 tablet 0  . Multiple Vitamins-Minerals (BARIATRIC MULTIVITAMINS/IRON PO) Take 1 tablet by mouth daily.    . OXYGEN Inhale 2 L into the lungs continuous.    . traZODone (DESYREL) 50 MG tablet TAKE 1 TABLET BY MOUTH AT BEDTIME AS NEEDED FOR SLEEP 90 tablet 1  . vitamin C (ASCORBIC ACID) 500 MG tablet Take 500 mg by mouth daily.     No current facility-administered medications on file prior to visit.   Allergies  Allergen Reactions  . Cephalexin Anaphylaxis and Hives    "throat started closing up"  . Rocephin [Ceftriaxone Sodium In Dextrose] Anaphylaxis  . Penicillins Rash    Has patient had a PCN reaction causing immediate rash, facial/tongue/throat swelling, SOB or lightheadedness with hypotension: no Has  patient had a PCN reaction causing severe rash involving mucus membranes or skin necrosis: no Has patient had a PCN reaction that required hospitalization no Has patient had a PCN reaction occurring within the last 10 years: no If all of the above answers are "NO", then may proceed with Cephalosporin use.  . Flecainide Acetate Other (See Comments)    Jittery   . Moxifloxacin     Does not remember the reaction  . Sulfonamide Derivatives     Does not remember reaction ; "was so young when I had reaction to it"   Social History   Socioeconomic History  . Marital status: Married    Spouse name: Not on file  . Number of children: Not on file  . Years of education: Not on file  . Highest education level: Not on file  Occupational History  . Not on file  Tobacco Use  . Smoking status: Never Smoker  . Smokeless tobacco: Never Used  Vaping Use  . Vaping Use: Never used  Substance and Sexual Activity  . Alcohol use: No  . Drug use: No  . Sexual activity: Yes    Comment: LAVH  Other Topics Concern  . Not on file  Social History Narrative   Works in data entry in [employer]   Married, lives in [place]   Tobacco Use - No.    Alcohol Use - no   Social Determinants of Health   Financial Resource Strain:   . Difficulty of Paying Living Expenses:   Food Insecurity:   . Worried About Programme researcher, broadcasting/film/video in the Last Year:   . Barista in the Last Year:   Transportation Needs:   . Freight forwarder (Medical):   Marland Kitchen Lack of Transportation (Non-Medical):   Physical Activity:   . Days of Exercise per Week:   . Minutes of Exercise per Session:   Stress:   . Feeling of Stress :   Social Connections:   . Frequency of Communication with Friends and Family:   . Frequency of  Social Gatherings with Friends and Family:   . Attends Religious Services:   . Active Member of Clubs or Organizations:   . Attends BankerClub or Organization Meetings:   Marland Kitchen. Marital Status:   Intimate  Partner Violence:   . Fear of Current or Ex-Partner:   . Emotionally Abused:   Marland Kitchen. Physically Abused:   . Sexually Abused:       Review of Systems  All other systems reviewed and are negative.      Objective:   Physical Exam Vitals reviewed.  Constitutional:      General: She is not in acute distress.    Appearance: She is well-developed. She is obese. She is not ill-appearing, toxic-appearing or diaphoretic.  HENT:     Head: Normocephalic and atraumatic.     Right Ear: Tympanic membrane, ear canal and external ear normal. There is no impacted cerumen.     Left Ear: Tympanic membrane, ear canal and external ear normal. There is no impacted cerumen.     Nose: Mucosal edema present. No congestion or rhinorrhea.     Mouth/Throat:     Mouth: Mucous membranes are moist.     Pharynx: No oropharyngeal exudate or posterior oropharyngeal erythema.  Eyes:     Extraocular Movements: Extraocular movements intact.     Conjunctiva/sclera: Conjunctivae normal.     Pupils: Pupils are equal, round, and reactive to light.  Neck:     Vascular: No carotid bruit.  Cardiovascular:     Rate and Rhythm: Normal rate. Rhythm irregular.     Heart sounds: Normal heart sounds. No murmur heard.  No friction rub. No gallop.   Pulmonary:     Effort: Pulmonary effort is normal. No respiratory distress.     Breath sounds: Normal breath sounds. No wheezing, rhonchi or rales.  Chest:     Chest wall: No tenderness.  Abdominal:     General: Abdomen is flat. Bowel sounds are normal. There is no distension.     Palpations: Abdomen is soft.     Tenderness: There is no abdominal tenderness. There is no right CVA tenderness, left CVA tenderness, guarding or rebound.     Hernia: No hernia is present.  Musculoskeletal:     Cervical back: Neck supple. No rigidity.     Right lower leg: No edema.     Left lower leg: No edema.  Lymphadenopathy:     Cervical: No cervical adenopathy.  Skin:    General: Skin is  warm.     Coloration: Skin is not jaundiced.     Findings: No bruising, erythema, lesion or rash.  Neurological:     General: No focal deficit present.     Mental Status: She is alert and oriented to person, place, and time. Mental status is at baseline.     Cranial Nerves: No cranial nerve deficit.     Sensory: No sensory deficit.     Motor: No weakness.     Coordination: Coordination normal.     Gait: Gait normal.     Deep Tendon Reflexes: Reflexes normal.  Psychiatric:        Mood and Affect: Mood normal.        Behavior: Behavior normal.        Thought Content: Thought content normal.        Judgment: Judgment normal.           Assessment & Plan:  General medical exam - Plan: CBC with Differential/Platelet, COMPLETE METABOLIC PANEL WITH GFR,  Lipid panel, MM Digital Screening  PAF (paroxysmal atrial fibrillation) (HCC) - Plan: CBC with Differential/Platelet, COMPLETE METABOLIC PANEL WITH GFR, Lipid panel  Pulmonary hypertension associated with unclear multi-factorial mechanisms (HCC) - Plan: CBC with Differential/Platelet, COMPLETE METABOLIC PANEL WITH GFR, Lipid panel  Benign essential HTN - Plan: CBC with Differential/Platelet, COMPLETE METABOLIC PANEL WITH GFR, Lipid panel  Moderate persistent asthma, unspecified whether complicated  Interstitial lung disease (HCC)  Other specified hypothyroidism - Plan: TSH  I will schedule the patient for mammogram.  Colonoscopy, Pap smear up-to-date.  Bone density test is up-to-date.  I recommended the shingles vaccine.  Also recommended strongly the Covid vaccine.  I will check a CBC, CMP, fasting lipid panel.  I will also check a TSH.  Meanwhile I have recommended that she decrease her dose of losartan to 50 mg a day to see if this will help with the fatigue.  She denies any falls, depression, or memory loss.

## 2020-05-01 LAB — CBC WITH DIFFERENTIAL/PLATELET
Absolute Monocytes: 424 cells/uL (ref 200–950)
Basophils Absolute: 50 cells/uL (ref 0–200)
Basophils Relative: 0.9 %
Eosinophils Absolute: 88 cells/uL (ref 15–500)
Eosinophils Relative: 1.6 %
HCT: 40 % (ref 35.0–45.0)
Hemoglobin: 13.1 g/dL (ref 11.7–15.5)
Lymphs Abs: 1480 cells/uL (ref 850–3900)
MCH: 29.4 pg (ref 27.0–33.0)
MCHC: 32.8 g/dL (ref 32.0–36.0)
MCV: 89.7 fL (ref 80.0–100.0)
MPV: 11.3 fL (ref 7.5–12.5)
Monocytes Relative: 7.7 %
Neutro Abs: 3460 cells/uL (ref 1500–7800)
Neutrophils Relative %: 62.9 %
Platelets: 221 10*3/uL (ref 140–400)
RBC: 4.46 10*6/uL (ref 3.80–5.10)
RDW: 12.7 % (ref 11.0–15.0)
Total Lymphocyte: 26.9 %
WBC: 5.5 10*3/uL (ref 3.8–10.8)

## 2020-05-01 LAB — COMPLETE METABOLIC PANEL WITH GFR
AG Ratio: 1.7 (calc) (ref 1.0–2.5)
ALT: 19 U/L (ref 6–29)
AST: 21 U/L (ref 10–35)
Albumin: 4.4 g/dL (ref 3.6–5.1)
Alkaline phosphatase (APISO): 115 U/L (ref 37–153)
BUN/Creatinine Ratio: 32 (calc) — ABNORMAL HIGH (ref 6–22)
BUN: 29 mg/dL — ABNORMAL HIGH (ref 7–25)
CO2: 31 mmol/L (ref 20–32)
Calcium: 9.6 mg/dL (ref 8.6–10.4)
Chloride: 99 mmol/L (ref 98–110)
Creat: 0.91 mg/dL (ref 0.50–0.99)
GFR, Est African American: 76 mL/min/{1.73_m2} (ref >=60)
GFR, Est Non African American: 65 mL/min/{1.73_m2} (ref >=60)
Globulin: 2.6 g/dL (calc) (ref 1.9–3.7)
Glucose, Bld: 85 mg/dL (ref 65–99)
Potassium: 3.9 mmol/L (ref 3.5–5.3)
Sodium: 140 mmol/L (ref 135–146)
Total Bilirubin: 0.8 mg/dL (ref 0.2–1.2)
Total Protein: 7 g/dL (ref 6.1–8.1)

## 2020-05-01 LAB — LIPID PANEL
Cholesterol: 270 mg/dL — ABNORMAL HIGH (ref <200)
HDL: 84 mg/dL (ref >=50)
LDL Cholesterol (Calc): 164 mg/dL (calc) — ABNORMAL HIGH
Non-HDL Cholesterol (Calc): 186 mg/dL (calc) — ABNORMAL HIGH (ref <130)
Total CHOL/HDL Ratio: 3.2 (calc) (ref <5.0)
Triglycerides: 108 mg/dL (ref <150)

## 2020-05-04 ENCOUNTER — Encounter: Payer: Self-pay | Admitting: *Deleted

## 2020-05-09 ENCOUNTER — Other Ambulatory Visit: Payer: Self-pay | Admitting: Family Medicine

## 2020-05-16 ENCOUNTER — Ambulatory Visit
Admission: RE | Admit: 2020-05-16 | Discharge: 2020-05-16 | Disposition: A | Discharge status: Home / Self Care | Payer: Medicare Other | Source: Ambulatory Visit | Attending: Family Medicine | Admitting: Family Medicine

## 2020-05-16 ENCOUNTER — Other Ambulatory Visit: Payer: Self-pay

## 2020-05-16 DIAGNOSIS — Z0000 Encounter for general adult medical examination without abnormal findings: Secondary | ICD-10-CM

## 2020-07-08 ENCOUNTER — Other Ambulatory Visit: Payer: Self-pay | Admitting: Family Medicine

## 2020-09-15 ENCOUNTER — Other Ambulatory Visit: Payer: Self-pay | Admitting: Family Medicine

## 2020-10-02 ENCOUNTER — Other Ambulatory Visit: Payer: Self-pay | Admitting: Family Medicine

## 2020-10-17 ENCOUNTER — Other Ambulatory Visit: Payer: Self-pay | Admitting: Family Medicine

## 2020-12-12 ENCOUNTER — Encounter (HOSPITAL_COMMUNITY): Payer: Self-pay | Admitting: *Deleted

## 2020-12-31 ENCOUNTER — Other Ambulatory Visit: Payer: Self-pay | Admitting: Family Medicine

## 2021-03-16 ENCOUNTER — Other Ambulatory Visit: Payer: Self-pay | Admitting: Family Medicine

## 2021-04-25 ENCOUNTER — Other Ambulatory Visit: Payer: Self-pay | Admitting: Family Medicine

## 2021-05-21 ENCOUNTER — Telehealth: Payer: Self-pay | Admitting: Family Medicine

## 2021-05-21 MED ORDER — HYOSCYAMINE SULFATE 0.125 MG PO TABS: ORAL_TABLET | ORAL | 0 refills | Status: DC

## 2021-05-21 MED ORDER — LEVOTHYROXINE SODIUM 75 MCG PO TABS: ORAL_TABLET | ORAL | 0 refills | Status: DC

## 2021-05-21 NOTE — Telephone Encounter (Signed)
Medication filled x1 with no refills.   Requires office visit before any further refills can be given.   Letter sent.  

## 2021-05-21 NOTE — Telephone Encounter (Signed)
Patient called to follow up on refill request made by pharmacy for  hyoscyamine (LEVSIN) 0.125 MG tablet [[id number]]   levothyroxine (SYNTHROID) 75 MCG tablet [[id number]]    Pharmacy confirmed as   [id number] [place], [place] - [address]  [address] [place] [address]  Phone:  [phone]  Fax:  [phone]  DEA #:  [id number]  Please advise at [phone].

## 2021-06-19 ENCOUNTER — Other Ambulatory Visit: Payer: Self-pay

## 2021-06-19 ENCOUNTER — Encounter (INDEPENDENT_AMBULATORY_CARE_PROVIDER_SITE_OTHER): Payer: Self-pay | Admitting: Bariatrics

## 2021-06-19 ENCOUNTER — Ambulatory Visit (INDEPENDENT_AMBULATORY_CARE_PROVIDER_SITE_OTHER): Payer: Medicare Other | Admitting: Bariatrics

## 2021-06-19 VITALS — BP 124/63 | HR 59 | Temp 97.7°F | Ht 64.0 in | Wt 275.0 lb

## 2021-06-19 DIAGNOSIS — I4891 Unspecified atrial fibrillation: Secondary | ICD-10-CM | POA: Diagnosis not present

## 2021-06-19 DIAGNOSIS — Z1331 Encounter for screening for depression: Secondary | ICD-10-CM | POA: Diagnosis not present

## 2021-06-19 DIAGNOSIS — R5383 Other fatigue: Secondary | ICD-10-CM | POA: Diagnosis not present

## 2021-06-19 DIAGNOSIS — E038 Other specified hypothyroidism: Secondary | ICD-10-CM

## 2021-06-19 DIAGNOSIS — R0602 Shortness of breath: Secondary | ICD-10-CM | POA: Diagnosis not present

## 2021-06-19 DIAGNOSIS — E559 Vitamin D deficiency, unspecified: Secondary | ICD-10-CM

## 2021-06-19 DIAGNOSIS — Z6841 Body Mass Index (BMI) 40.0 and over, adult: Secondary | ICD-10-CM

## 2021-06-19 DIAGNOSIS — I5032 Chronic diastolic (congestive) heart failure: Secondary | ICD-10-CM

## 2021-06-19 DIAGNOSIS — Z0289 Encounter for other administrative examinations: Secondary | ICD-10-CM

## 2021-06-19 DIAGNOSIS — I10 Essential (primary) hypertension: Secondary | ICD-10-CM | POA: Diagnosis not present

## 2021-06-19 DIAGNOSIS — E039 Hypothyroidism, unspecified: Secondary | ICD-10-CM

## 2021-06-19 DIAGNOSIS — Z9884 Bariatric surgery status: Secondary | ICD-10-CM

## 2021-06-19 DIAGNOSIS — G4733 Obstructive sleep apnea (adult) (pediatric): Secondary | ICD-10-CM

## 2021-06-20 ENCOUNTER — Encounter (INDEPENDENT_AMBULATORY_CARE_PROVIDER_SITE_OTHER): Payer: Self-pay | Admitting: Bariatrics

## 2021-06-20 DIAGNOSIS — R7303 Prediabetes: Secondary | ICD-10-CM | POA: Insufficient documentation

## 2021-06-20 LAB — COMPREHENSIVE METABOLIC PANEL
ALT: 14 IU/L (ref 0–32)
AST: 21 IU/L (ref 0–40)
Albumin/Globulin Ratio: 2 (ref 1.2–2.2)
Albumin: 4.5 g/dL (ref 3.8–4.8)
Alkaline Phosphatase: 126 IU/L — ABNORMAL HIGH (ref 44–121)
BUN/Creatinine Ratio: 27 (ref 12–28)
BUN: 25 mg/dL (ref 8–27)
Bilirubin Total: 0.3 mg/dL (ref 0.0–1.2)
CO2: 24 mmol/L (ref 20–29)
Calcium: 9.5 mg/dL (ref 8.7–10.3)
Chloride: 102 mmol/L (ref 96–106)
Creatinine, Ser: 0.94 mg/dL (ref 0.57–1.00)
Globulin, Total: 2.3 g/dL (ref 1.5–4.5)
Glucose: 84 mg/dL (ref 65–99)
Potassium: 4.6 mmol/L (ref 3.5–5.2)
Sodium: 144 mmol/L (ref 134–144)
Total Protein: 6.8 g/dL (ref 6.0–8.5)
eGFR: 66 mL/min/{1.73_m2} (ref >59)

## 2021-06-20 LAB — LIPID PANEL WITH LDL/HDL RATIO
Cholesterol, Total: 266 mg/dL — ABNORMAL HIGH (ref 100–199)
HDL: 81 mg/dL (ref >39)
LDL Chol Calc (NIH): 167 mg/dL — ABNORMAL HIGH (ref 0–99)
LDL/HDL Ratio: 2.1 ratio (ref 0.0–3.2)
Triglycerides: 105 mg/dL (ref 0–149)
VLDL Cholesterol Cal: 18 mg/dL (ref 5–40)

## 2021-06-20 LAB — T4, FREE: Free T4: 1.37 ng/dL (ref 0.82–1.77)

## 2021-06-20 LAB — VITAMIN D 25 HYDROXY (VIT D DEFICIENCY, FRACTURES): Vit D, 25-Hydroxy: 37.3 ng/mL (ref 30.0–100.0)

## 2021-06-20 LAB — HEMOGLOBIN A1C
Est. average glucose Bld gHb Est-mCnc: 126 mg/dL
Hgb A1c MFr Bld: 6 % — ABNORMAL HIGH (ref 4.8–5.6)

## 2021-06-20 LAB — TSH: TSH: 2.17 u[IU]/mL (ref 0.450–4.500)

## 2021-06-20 LAB — INSULIN, RANDOM: INSULIN: 4.9 u[IU]/mL (ref 2.6–24.9)

## 2021-06-20 LAB — T3: T3, Total: 63 ng/dL — ABNORMAL LOW (ref 71–180)

## 2021-06-20 NOTE — Progress Notes (Signed)
Dear Heather Slade, PA,   Thank you for referring Heather [name] to our clinic. The following note includes my evaluation and treatment recommendations.  Chief Complaint:   OBESITY Heather [name] (MR# [id number]) is a 70 y.o. female who presents for evaluation and treatment of obesity and related comorbidities. Current BMI is Body mass index is 47.2 kg/m. [name] has been struggling with her weight for many years and has been unsuccessful in either losing weight, maintaining weight loss, or reaching her healthy weight goal.  Heather [name] is currently in the action stage of change and ready to dedicate time achieving and maintaining a healthier weight. Heather [name] is interested in becoming our patient and working on intensive lifestyle modifications including (but not limited to) diet and exercise for weight loss.  Heather [name] states that she is lactose intolerant.  She states that she likes to cook.  She states that she craves salty carbohydrates and sweets.  Heather [name]'s habits were reviewed today and are as follows: Her family eats meals together, her desired weight loss is 92 pounds, she has been heavy most of her life, she started gaining weight after pregnancy, her heaviest weight ever was 373 pounds, she craves salty carbs and sweets, she snacks frequently in the evenings, she frequently makes poor food choices, she has problems with excessive hunger, she frequently eats larger portions than normal, and she struggles with emotional eating.  Depression Screen [name]'s Food and Mood (modified PHQ-9) score was 15.  Depression screen PHQ 2/9 06/19/2021  Decreased Interest 2  Down, Depressed, Hopeless 2  PHQ - 2 Score 4  Altered sleeping 1  Tired, decreased energy 2  Change in appetite 1  Feeling bad or failure about yourself  3  Trouble concentrating 1  Moving slowly or fidgety/restless 3  Suicidal thoughts 0  PHQ-9 Score 15  Difficult doing work/chores Not difficult at all  Some  recent data might be hidden   Subjective:   1. Other fatigue Heather [name] reports daytime somnolence and admits to waking up still tired. Patent has a history of symptoms of daytime fatigue and morning fatigue. Heather [name] generally gets 8 hours of sleep per night, and states that she has generally restful sleep. Snoring is not present. Apneic episodes are not present. Epworth Sleepiness Score is 6.  Occurs with certain activities.  2. SOB (shortness of breath) on exertion Heather [name] notes increasing shortness of breath with exercising and seems to be worsening over time with weight gain. She notes getting out of breath sooner with activity than she used to. This has gotten worse recently. Heather [name] denies shortness of breath at rest or orthopnea.  Occurs with certain activities.  3. Essential hypertension Controlled.  Review: taking medications as instructed, no medication side effects noted, no chest pain on exertion, no dyspnea on exertion, no swelling of ankles.    BP Readings from Last 3 Encounters:  06/19/21 124/63  04/30/20 102/60  11/06/19 101/73   4. Atrial fibrillation, unspecified type (HCC) Long-standing.  She is on Eliquis 5 mg twice daily.  5. Chronic diastolic congestive heart failure (HCC) She had an echo this year.  Followed by Cardiology.   6. Obstructive sleep apnea syndrome Heather [name] has a diagnosis of sleep apnea. She reports that she is using a CPAP regularly.   7. S/P laparoscopic sleeve gastrectomy In 2019.  No complications.  Lowest weight 225 pounds, highest weight 380 pounds.  Limited restrictions.  8. Hypothyroidism, unspecified type She is taking Synthroid 75 mcg daily.  Lab Results  Component Value Date   TSH 2.170 06/19/2021    9. Vitamin D deficiency She is currently taking OTC vitamin D 2,000 IU each day. She denies nausea, vomiting or muscle weakness.  Lab Results  Component Value Date   VD25OH 37.3 06/19/2021   10. Depression screen [name] was  screened for depression as part of her new patient workup today.  PHQ-9 is 15.  Assessment/Plan:   1. Other fatigue Heather [name] does feel that her weight is causing her energy to be lower than it should be. Fatigue may be related to obesity, depression or many other causes. Labs will be ordered, and in the meanwhile, Heather [name] will focus on self care including making healthy food choices, increasing physical activity and focusing on stress reduction.  Gradually increase activities.  Will check labs today.  - EKG 12-Lead - Hemoglobin A1c - Insulin, random - T3 - T4, free - TSH - VITAMIN D 25 Hydroxy (Vit-D Deficiency, Fractures)  2. SOB (shortness of breath) on exertion Heather [name] does feel that she gets out of breath more easily that she used to when she exercises. Heather [name]'s shortness of breath appears to be obesity related and exercise induced. She has agreed to work on weight loss and gradually increase exercise to treat her exercise induced shortness of breath. Will continue to monitor closely.  Gradually increase activities.  3. Essential hypertension Heather [name] is working on healthy weight loss and exercise to improve blood pressure control. We will watch for signs of hypotension as she continues her lifestyle modifications.  Continue medications.  4. Atrial fibrillation, unspecified type (HCC) Heather [name] will follow-up with her cardiologist.  5. Chronic diastolic congestive heart failure (HCC) Heather [name] will follow-up with Cardiology.  We will continue to monitor symptoms as they relate to her weight loss journey.  Check labs today.  - Lipid Panel With LDL/HDL Ratio - Comprehensive metabolic panel  6. Obstructive sleep apnea syndrome Intensive lifestyle modifications are the first line treatment for this issue. We discussed several lifestyle modifications today and she will continue to work on diet, exercise and weight loss efforts. We will continue to monitor. Orders and follow up as documented  in patient record.  She will continue to use her CPAP nightly.   7. S/P laparoscopic sleeve gastrectomy Heather [name] will eat smaller, more frequent meals.  She will also increase her protein intake.  Heather [name] is at risk for malnutrition due to her previous bariatric surgery.   Counseling You may need to eat 3 meals and 2 snacks, or 5 small meals each day in order to reach your protein and calorie goals.  Allow at least 15 minutes for each meal so that you can eat mindfully. Listen to your body so that you do not overeat. For most people, your sleeve or pouch will comfortably hold 4-6 ounces. Eat foods from all food groups. This includes fruits and vegetables, grains, dairy, and meat and other proteins. Include a protein-rich food at every meal and snack, and eat the protein food first.  You should be taking a Bariatric Multivitamin as well as calcium.   8. Hypothyroidism, unspecified type Patient with long-standing hypothyroidism, on levothyroxine therapy. She appears euthyroid. Orders and follow up as documented in patient record.  Continue Synthroid.  Check thyroid panel today.  Counseling Good thyroid control is important for overall health. Supratherapeutic thyroid levels are dangerous and will not improve weight loss results. The correct way to take levothyroxine is fasting, with water, separated by at least 30 minutes  from breakfast, and separated by more than 4 hours from calcium, iron, multivitamins, acid reflux medications (PPIs).   - T3 - T4, free - TSH  9. Vitamin D deficiency Continue OTC vitamin D.  Will check vitamin D level today.  - VITAMIN D 25 Hydroxy (Vit-D Deficiency, Fractures)  10. Depression screen Andie had a positive depression screening. Depression is commonly associated with obesity and often results in emotional eating behaviors. We will monitor this closely and work on CBT to help improve the non-hunger eating patterns. Referral to Psychology may be required if  no improvement is seen as she continues in our clinic.  11. Class 3 severe obesity with serious comorbidity and body mass index (BMI) of 45.0 to 49.9 in adult, unspecified obesity type (HCC)  Tienna is currently in the action stage of change and her goal is to continue with weight loss efforts. I recommend Earlisha begin the structured treatment plan as follows:  She has agreed to the Category 1 Plan.  She will work on meal planning.  Reviewed labs from 04/30/2021, including CMP, lipids, CBC, and TSH.  Exercise goals: No exercise has been prescribed at this time.   Behavioral modification strategies: increasing lean protein intake, decreasing simple carbohydrates, increasing vegetables, increasing water intake, decreasing eating out, no skipping meals, meal planning and cooking strategies, keeping healthy foods in the home, and planning for success.  She was informed of the importance of frequent follow-up visits to maximize her success with intensive lifestyle modifications for her multiple health conditions. She was informed we would discuss her lab results at her next visit unless there is a critical issue that needs to be addressed sooner. Antinette agreed to keep her next visit at the agreed upon time to discuss these results.  Objective:   Blood pressure 124/63, pulse (!) 59, temperature 97.7 F (36.5 C), height 5\' 4"  (1.626 m), weight 275 lb (124.7 kg), SpO2 99 %. Body mass index is 47.2 kg/m.  EKG:  Atrial  Bradycardia ( long-standing atrial rhythm).  First degree A-V block.  P:QRS - 1:1, Abnormal P axis, H Rate 50.  PRi = 240.  BORDERLINE RHYTHM  Indirect Calorimeter completed today shows a VO2 of 196 and a REE of 1354.  Her calculated basal metabolic rate is thus her basal metabolic rate is worse than expected.  General: Cooperative, alert, well developed, in no acute distress. HEENT: Conjunctivae and lids unremarkable. Cardiovascular: Regular rhythm.  Lungs: Normal work of  breathing. Neurologic: No focal deficits.  Using a cane with ambulation.  Lab Results  Component Value Date   CREATININE 0.94 06/19/2021   BUN 25 06/19/2021   NA 144 06/19/2021   K 4.6 06/19/2021   CL 102 06/19/2021   CO2 24 06/19/2021   Lab Results  Component Value Date   ALT 14 06/19/2021   AST 21 06/19/2021   ALKPHOS 126 (H) 06/19/2021   BILITOT 0.3 06/19/2021   Lab Results  Component Value Date   HGBA1C 6.0 (H) 06/19/2021   HGBA1C 6.0 03/29/2018   HGBA1C 6.1 (H) 03/05/2015   Lab Results  Component Value Date   INSULIN WILL FOLLOW 06/19/2021   Lab Results  Component Value Date   TSH 2.170 06/19/2021   Lab Results  Component Value Date   CHOL 266 (H) 06/19/2021   HDL 81 06/19/2021   LDLCALC 167 (H) 06/19/2021   TRIG 105 06/19/2021   CHOLHDL 3.2 04/30/2020   Lab Results  Component Value Date   WBC  5.5 04/30/2020   HGB 13.1 04/30/2020   HCT 40.0 04/30/2020   MCV 89.7 04/30/2020   PLT 221 04/30/2020   Obesity Behavioral Intervention:   Approximately 15 minutes were spent on the discussion below.  ASK: We discussed the diagnosis of obesity with Aram BeechamCynthia today and Aram BeechamCynthia agreed to give us permission to discuss obesity behavioral modification therapy today.  ASSESS: Aram BeechamCynthia has the diagnosis of obesity and her BMI today is 47.2. Aram BeechamCynthia is in the action stage of change.   ADVISE: Aram BeechamCynthia was educated on the multiple health risks of obesity as well as the benefit of weight loss to improve her health. She was advised of the need for long term treatment and the importance of lifestyle modifications to improve her current health and to decrease her risk of future health problems.  AGREE: Multiple dietary modification options and treatment options were discussed and Aram BeechamCynthia agreed to follow the recommendations documented in the above note.  ARRANGE: Aram BeechamCynthia was educated on the importance of frequent visits to treat obesity as outlined per CMS and USPSTF  guidelines and agreed to schedule her next follow up appointment today.  Attestation Statements:   Reviewed by clinician on day of visit: allergies, medications, problem list, medical history, surgical history, family history, social history, and previous encounter notes.  I, Insurance claims handlerAmber Agner, CMA, am acting as Energy managertranscriptionist for [employer] Haruo Stepanek, DO  I have reviewed the above documentation for accuracy and completeness, and I agree with the above. Corinna CapraAngel Suprena Travaglini, DO

## 2021-06-23 ENCOUNTER — Encounter (INDEPENDENT_AMBULATORY_CARE_PROVIDER_SITE_OTHER): Payer: Self-pay | Admitting: Bariatrics

## 2021-07-03 ENCOUNTER — Encounter (INDEPENDENT_AMBULATORY_CARE_PROVIDER_SITE_OTHER): Payer: Self-pay | Admitting: Bariatrics

## 2021-07-03 ENCOUNTER — Other Ambulatory Visit: Payer: Self-pay

## 2021-07-03 ENCOUNTER — Ambulatory Visit (INDEPENDENT_AMBULATORY_CARE_PROVIDER_SITE_OTHER): Payer: Medicare Other | Admitting: Bariatrics

## 2021-07-03 VITALS — BP 102/69 | HR 60 | Temp 98.1°F | Ht 64.0 in | Wt 268.0 lb

## 2021-07-03 DIAGNOSIS — E559 Vitamin D deficiency, unspecified: Secondary | ICD-10-CM

## 2021-07-03 DIAGNOSIS — E7849 Other hyperlipidemia: Secondary | ICD-10-CM

## 2021-07-03 DIAGNOSIS — Z6841 Body Mass Index (BMI) 40.0 and over, adult: Secondary | ICD-10-CM

## 2021-07-03 DIAGNOSIS — R7303 Prediabetes: Secondary | ICD-10-CM | POA: Diagnosis not present

## 2021-07-03 MED ORDER — VITAMIN D (ERGOCALCIFEROL) 1.25 MG (50000 UNIT) PO CAPS: 50000.0000 [IU] | ORAL_CAPSULE | ORAL | 0 refills | Status: DC

## 2021-07-03 NOTE — Progress Notes (Signed)
Chief Complaint:   OBESITY Heather [name] is here to discuss her progress with her obesity treatment plan along with follow-up of her obesity related diagnoses. Heather [name] is on the Category 1 Plan and states she is following her eating plan approximately 99% of the time. Heather [name] states she is walking for 20 minutes 2-3 times per week.  Today's visit was #: 3 Starting weight: 275 lbs Starting date: 06/19/2021 Today's weight: 268 lbs Today's date: 07/03/2021 Total lbs lost to date: 7 lbs Total lbs lost since last in-office visit: 5 lbs  Interim History: Heather [name] is down 5 lbs since her first visit. She states that the first few days that she was hungry, but it's better now.  Subjective:   1. Prediabetes Mikesha has a diagnosis of prediabetes based on her elevated HgA1c and was informed this puts her at greater risk of developing diabetes. Her last A1C level was 6.0. Her insulin resistance level was 4.9. She continues to work on diet and exercise to decrease her risk of diabetes. She denies nausea or hypoglycemia.  2. Other hyperlipidemia Heather [name]'s level has slightly improved from 7/21. Heather [name] is statin intolerance due to memory issues.   3. Vitamin D insufficiency Heather [name] is currently taking Vitamin OTC.   Assessment/Plan:   1. Prediabetes Heather [name] will continue to work on weight loss, activities, exercise, and decreasing simple carbohydrates to help decrease the risk of diabetes. She will increase healthy fats and protein.  2. Other hyperlipidemia We will recheck 3-4 months. Heather [name] will be followed by cardiologist 6 months. Cardiovascular risk and specific lipid/LDL goals reviewed.  We discussed several lifestyle modifications today and Heather [name] will continue to work on diet, exercise and weight loss efforts. Orders and follow up as documented in patient record.   Counseling Intensive lifestyle modifications are the first line treatment for this issue. Dietary changes: Increase soluble  fiber. Decrease simple carbohydrates. Exercise changes: Moderate to vigorous-intensity aerobic activity 150 minutes per week if tolerated. Lipid-lowering medications: see documented in medical record.   3. Vitamin D insufficiency Low Vitamin D level contributes to fatigue and are associated with obesity, breast, and colon cancer. Heather [name] agrees to start prescription Vitamin D 50,000 IU every week for 1 month with no refills and Heather [name] will follow-up for routine testing of Vitamin D, at least 2-3 times per year to avoid over-replacement.  - Vitamin D, Ergocalciferol, (DRISDOL) 1.25 MG (50000 UNIT) CAPS capsule; Take 1 capsule (50,000 Units total) by mouth every 7 (seven) days.  Dispense: 4 capsule; Refill: 0  4. Obesity, current BMI 46.1 Heather [name] is currently in the action stage of change. As such, her goal is to continue with weight loss efforts. She has agreed to the Category 1 Plan.   Heather [name] will continue meal planning and intentional eating. We will review labs from 06/19/2021.  Exercise goals:  As is.  Behavioral modification strategies: increasing lean protein intake, decreasing simple carbohydrates, increasing vegetables, increasing water intake, decreasing eating out, no skipping meals, meal planning and cooking strategies, keeping healthy foods in the home, and planning for success.  Heather [name] has agreed to follow-up with our clinic in 2 weeks. She was informed of the importance of frequent follow-up visits to maximize her success with intensive lifestyle modifications for her multiple health conditions.   Objective:   Blood pressure 102/69, pulse 60, temperature 98.1 F (36.7 C), height 5\' 4"  (1.626 m), weight 268 lb (121.6 kg), SpO2 99 %. Body mass index is 46 kg/m.  General: Cooperative, alert, well  developed, in no acute distress. HEENT: Conjunctivae and lids unremarkable. Cardiovascular: Regular rhythm.  Lungs: Normal work of breathing. Neurologic: No focal deficits.   Lab  Results  Component Value Date   CREATININE 0.94 06/19/2021   BUN 25 06/19/2021   NA 144 06/19/2021   K 4.6 06/19/2021   CL 102 06/19/2021   CO2 24 06/19/2021   Lab Results  Component Value Date   ALT 14 06/19/2021   AST 21 06/19/2021   ALKPHOS 126 (H) 06/19/2021   BILITOT 0.3 06/19/2021   Lab Results  Component Value Date   HGBA1C 6.0 (H) 06/19/2021   HGBA1C 6.0 03/29/2018   HGBA1C 6.1 (H) 03/05/2015   Lab Results  Component Value Date   INSULIN 4.9 06/19/2021   Lab Results  Component Value Date   TSH 2.170 06/19/2021   Lab Results  Component Value Date   CHOL 266 (H) 06/19/2021   HDL 81 06/19/2021   LDLCALC 167 (H) 06/19/2021   TRIG 105 06/19/2021   CHOLHDL 3.2 04/30/2020   Lab Results  Component Value Date   VD25OH 37.3 06/19/2021   Lab Results  Component Value Date   WBC 5.5 04/30/2020   HGB 13.1 04/30/2020   HCT 40.0 04/30/2020   MCV 89.7 04/30/2020   PLT 221 04/30/2020   No results found for: IRON, TIBC, FERRITIN  Obesity Behavioral Intervention:   Approximately 15 minutes were spent on the discussion below.  ASK: We discussed the diagnosis of obesity with Veneta today and Ymani agreed to give [place] permission to discuss obesity behavioral modification therapy today.  ASSESS: Azarria has the diagnosis of obesity and her BMI today is 46.1. Delayni is in the action stage of change.   ADVISE: Ieesha was educated on the multiple health risks of obesity as well as the benefit of weight loss to improve her health. She was advised of the need for long term treatment and the importance of lifestyle modifications to improve her current health and to decrease her risk of future health problems.  AGREE: Multiple dietary modification options and treatment options were discussed and Calais agreed to follow the recommendations documented in the above note.  ARRANGE: Jeanna was educated on the importance of frequent visits to treat obesity as outlined per  CMS and USPSTF guidelines and agreed to schedule her next follow up appointment today.  Attestation Statements:   Reviewed by clinician on day of visit: allergies, medications, problem list, medical history, surgical history, family history, social history, and previous encounter notes.  I, Jackson Latino, RMA, am acting as Energy manager for [employer], DO.   I have reviewed the above documentation for accuracy and completeness, and I agree with the above. Corinna Capra, DO

## 2021-07-04 ENCOUNTER — Encounter (INDEPENDENT_AMBULATORY_CARE_PROVIDER_SITE_OTHER): Payer: Self-pay | Admitting: Bariatrics

## 2021-07-17 ENCOUNTER — Other Ambulatory Visit: Payer: Self-pay

## 2021-07-17 ENCOUNTER — Ambulatory Visit (INDEPENDENT_AMBULATORY_CARE_PROVIDER_SITE_OTHER): Payer: Medicare Other | Admitting: Bariatrics

## 2021-07-17 ENCOUNTER — Encounter (INDEPENDENT_AMBULATORY_CARE_PROVIDER_SITE_OTHER): Payer: Self-pay | Admitting: Bariatrics

## 2021-07-17 VITALS — BP 102/68 | HR 63 | Temp 98.1°F | Ht 60.0 in | Wt 266.0 lb

## 2021-07-17 DIAGNOSIS — F5089 Other specified eating disorder: Secondary | ICD-10-CM

## 2021-07-17 DIAGNOSIS — Z6841 Body Mass Index (BMI) 40.0 and over, adult: Secondary | ICD-10-CM

## 2021-07-17 DIAGNOSIS — E559 Vitamin D deficiency, unspecified: Secondary | ICD-10-CM | POA: Diagnosis not present

## 2021-07-17 DIAGNOSIS — I10 Essential (primary) hypertension: Secondary | ICD-10-CM

## 2021-07-17 MED ORDER — BUPROPION HCL ER (SR) 150 MG PO TB12: 150.0000 mg | ORAL_TABLET | Freq: Every day | ORAL | 0 refills | Status: DC

## 2021-07-17 MED ORDER — VITAMIN D (ERGOCALCIFEROL) 1.25 MG (50000 UNIT) PO CAPS: 50000.0000 [IU] | ORAL_CAPSULE | ORAL | 0 refills | Status: DC

## 2021-07-17 NOTE — Progress Notes (Signed)
Chief Complaint:   OBESITY Chardonnay is here to discuss her progress with her obesity treatment plan along with follow-up of her obesity related diagnoses. [name] is on the Category 1 Plan and states she is following her eating plan approximately 100% of the time. Markeeta states she is doing 0 minutes 0 times per week.  Today's visit was #: 4 Starting weight: 275 lbs Starting date: 06/19/2021 Today's weight: 266 lbs Today's date: 07/17/2021 Total lbs lost to date: 9 lbs Total lbs lost since last in-office visit: 2 lbs  Interim History: Jaliyah is down another 2 lbs since her last visit. She struggles with cravings at night (salty or sweets).  Subjective:   1. Vitamin D insufficiency  Jazline is taking Vitamin D currently. She denies nausea, vomiting or muscle weakness.  2. Essential hypertension Kaylanie's blood pressure is controlled.  3. Other disorder of eating Ahana is struggling with emotional eating and using food for comfort to the extent that it is negatively impacting her health.   Assessment/Plan:   1. Vitamin D insufficiency Low Vitamin D level contributes to fatigue and are associated with obesity, breast, and colon cancer. We will refill prescription Vitamin D 50,000 IU every week for 1 month with no refills and Dametra will follow-up for routine testing of Vitamin D, at least 2-3 times per year to avoid over-replacement.  - Vitamin D, Ergocalciferol, (DRISDOL) 1.25 MG (50000 UNIT) CAPS capsule; Take 1 capsule (50,000 Units total) by mouth every 7 (seven) days.  Dispense: 4 capsule; Refill: 0  2. Essential hypertension Deryn will continue medications. She is working on healthy weight loss and exercise to improve blood pressure control. We will watch for signs of hypotension as she continues her lifestyle modifications.  3. Other disorder of eating Behavior modification techniques were discussed today to help Paiten deal with her emotional/non-hunger eating  behaviors.  We will refill Wellbutrin 150 mg for 1 month with no refills. Orders and follow up as documented in patient record.    - buPROPion (WELLBUTRIN SR) 150 MG 12 hr tablet; Take 1 tablet (150 mg total) by mouth daily.  Dispense: 30 tablet; Refill: 0  4. Obesity, current BMI 45.7 Kinsie is currently in the action stage of change. As such, her goal is to continue with weight loss efforts. She has agreed to the Category 1 Plan.   Braylee will continue meal planning and intentional eating.   Exercise goals: No exercise has been prescribed at this time.  Behavioral modification strategies: increasing lean protein intake, decreasing simple carbohydrates, increasing vegetables, increasing water intake, decreasing eating out, no skipping meals, meal planning and cooking strategies, keeping healthy foods in the home, and planning for success.  Madigan has agreed to follow-up with our clinic in 2 weeks. She was informed of the importance of frequent follow-up visits to maximize her success with intensive lifestyle modifications for her multiple health conditions.   Objective:   Blood pressure 102/68, pulse 63, temperature 98.1 F (36.7 C), height 5' (1.524 m), weight 266 lb (120.7 kg), SpO2 98 %. Body mass index is 51.95 kg/m.  General: Cooperative, alert, well developed, in no acute distress. HEENT: Conjunctivae and lids unremarkable. Cardiovascular: Regular rhythm.  Lungs: Normal work of breathing. Neurologic: No focal deficits.   Lab Results  Component Value Date   CREATININE 0.94 06/19/2021   BUN 25 06/19/2021   NA 144 06/19/2021   K 4.6 06/19/2021   CL 102 06/19/2021   CO2 24 06/19/2021  Lab Results  Component Value Date   ALT 14 06/19/2021   AST 21 06/19/2021   ALKPHOS 126 (H) 06/19/2021   BILITOT 0.3 06/19/2021   Lab Results  Component Value Date   HGBA1C 6.0 (H) 06/19/2021   HGBA1C 6.0 03/29/2018   HGBA1C 6.1 (H) 03/05/2015   Lab Results  Component Value Date    INSULIN 4.9 06/19/2021   Lab Results  Component Value Date   TSH 2.170 06/19/2021   Lab Results  Component Value Date   CHOL 266 (H) 06/19/2021   HDL 81 06/19/2021   LDLCALC 167 (H) 06/19/2021   TRIG 105 06/19/2021   CHOLHDL 3.2 04/30/2020   Lab Results  Component Value Date   VD25OH 37.3 06/19/2021   Lab Results  Component Value Date   WBC 5.5 04/30/2020   HGB 13.1 04/30/2020   HCT 40.0 04/30/2020   MCV 89.7 04/30/2020   PLT 221 04/30/2020   No results found for: IRON, TIBC, FERRITIN  Obesity Behavioral Intervention:   Approximately 15 minutes were spent on the discussion below.  ASK: We discussed the diagnosis of obesity with Indya today and Steffi agreed to give [place] permission to discuss obesity behavioral modification therapy today.  ASSESS: Elodia has the diagnosis of obesity and her BMI today is 45.7. Anah is in the action stage of change.   ADVISE: Leasha was educated on the multiple health risks of obesity as well as the benefit of weight loss to improve her health. She was advised of the need for long term treatment and the importance of lifestyle modifications to improve her current health and to decrease her risk of future health problems.  AGREE: Multiple dietary modification options and treatment options were discussed and Ryelle agreed to follow the recommendations documented in the above note.  ARRANGE: Arrayah was educated on the importance of frequent visits to treat obesity as outlined per CMS and USPSTF guidelines and agreed to schedule her next follow up appointment today.  Attestation Statements:   Reviewed by clinician on day of visit: allergies, medications, problem list, medical history, surgical history, family history, social history, and previous encounter notes.  I, Jackson Latino, RMA, am acting as Energy manager for [employer], DO.   I have reviewed the above documentation for accuracy and completeness, and I agree with  the above. Corinna Capra, DO

## 2021-07-22 ENCOUNTER — Encounter (INDEPENDENT_AMBULATORY_CARE_PROVIDER_SITE_OTHER): Payer: Self-pay | Admitting: Bariatrics

## 2021-08-01 ENCOUNTER — Other Ambulatory Visit: Payer: Self-pay

## 2021-08-01 ENCOUNTER — Ambulatory Visit (INDEPENDENT_AMBULATORY_CARE_PROVIDER_SITE_OTHER): Payer: Medicare Other | Admitting: Bariatrics

## 2021-08-01 VITALS — BP 115/50 | HR 51 | Temp 97.8°F | Ht 60.0 in | Wt 266.0 lb

## 2021-08-01 DIAGNOSIS — Z6841 Body Mass Index (BMI) 40.0 and over, adult: Secondary | ICD-10-CM | POA: Diagnosis not present

## 2021-08-01 DIAGNOSIS — F5089 Other specified eating disorder: Secondary | ICD-10-CM | POA: Diagnosis not present

## 2021-08-01 DIAGNOSIS — E559 Vitamin D deficiency, unspecified: Secondary | ICD-10-CM | POA: Diagnosis not present

## 2021-08-01 MED ORDER — BUPROPION HCL ER (SR) 150 MG PO TB12: 150.0000 mg | ORAL_TABLET | Freq: Every day | ORAL | 0 refills | Status: DC

## 2021-08-01 MED ORDER — VITAMIN D (ERGOCALCIFEROL) 1.25 MG (50000 UNIT) PO CAPS: 50000.0000 [IU] | ORAL_CAPSULE | ORAL | 0 refills | Status: DC

## 2021-08-01 NOTE — Progress Notes (Signed)
Chief Complaint:   OBESITY Heather [name] is here to discuss her progress with her obesity treatment plan along with follow-up of her obesity related diagnoses. Heather [name] is on the Category 1 Plan and states she is following her eating plan approximately 60% of the time. Heather [name] states she is walking for 15-20 minutes 4 times per week.  Today's visit was #: 5 Starting weight: 275 lbs Starting date: 06/19/2021 Today's weight: 266 lbs Today's date: 08/01/2021 Total lbs lost to date: 9 lbs Total lbs lost since last in-office visit: 0  Interim History: Heather [name]'s weight remains the same. She had an unexpected trip out of town.  Subjective:   1. Vitamin D insufficiency Heather [name] is taking Vitamin D as directed.  2. Other disorder of eating Heather [name] is taking Wellbutrin. She states Wellbutrin is helping for craving.   Assessment/Plan:   1. Vitamin D insufficiency Low Vitamin D level contributes to fatigue and are associated with obesity, breast, and colon cancer. We will refill prescription Vitamin D 50,000 IU every week for 1 month with no refills and Heather [name] will follow-up for routine testing of Vitamin D, at least 2-3 times per year to avoid over-replacement.  - Vitamin D, Ergocalciferol, (DRISDOL) 1.25 MG (50000 UNIT) CAPS capsule; Take 1 capsule (50,000 Units total) by mouth every 7 (seven) days.  Dispense: 4 capsule; Refill: 0  2. Other disorder of eating Behavior modification techniques were discussed today to help Heather [name] deal with her emotional/non-hunger eating behaviors.  We will refill Wellbutrin SR 150 mg for 1 month with no refills. Orders and follow up as documented in patient record.    - buPROPion (WELLBUTRIN SR) 150 MG 12 hr tablet; Take 1 tablet (150 mg total) by mouth daily.  Dispense: 30 tablet; Refill: 0  3. Obesity, current BMI 51.9 Heather [name] is currently in the action stage of change. As such, her goal is to continue with weight loss efforts. She has agreed to the  Category 1 Plan.   Heather [name] will continue meal planning. She will adhere to the plan at 80-90%. Handout on protein sheet was given today.   Exercise goals:  As is.  Behavioral modification strategies: increasing lean protein intake, decreasing simple carbohydrates, increasing vegetables, increasing water intake, decreasing eating out, no skipping meals, meal planning and cooking strategies, keeping healthy foods in the home, ways to avoid boredom eating, and planning for success.  Heather [name] has agreed to follow-up with our clinic in 2 weeks. She was informed of the importance of frequent follow-up visits to maximize her success with intensive lifestyle modifications for her multiple health conditions.   Objective:   Blood pressure (!) 115/50, pulse (!) 51, temperature 97.8 F (36.6 C), height 5' (1.524 m), weight 266 lb (120.7 kg), SpO2 97 %. Body mass index is 51.95 kg/m.  General: Cooperative, alert, well developed, in no acute distress. HEENT: Conjunctivae and lids unremarkable. Cardiovascular: Regular rhythm.  Lungs: Normal work of breathing. Neurologic: No focal deficits.   Lab Results  Component Value Date   CREATININE 0.94 06/19/2021   BUN 25 06/19/2021   NA 144 06/19/2021   K 4.6 06/19/2021   CL 102 06/19/2021   CO2 24 06/19/2021   Lab Results  Component Value Date   ALT 14 06/19/2021   AST 21 06/19/2021   ALKPHOS 126 (H) 06/19/2021   BILITOT 0.3 06/19/2021   Lab Results  Component Value Date   HGBA1C 6.0 (H) 06/19/2021   HGBA1C 6.0 03/29/2018   HGBA1C 6.1 (H) 03/05/2015  Lab Results  Component Value Date   INSULIN 4.9 06/19/2021   Lab Results  Component Value Date   TSH 2.170 06/19/2021   Lab Results  Component Value Date   CHOL 266 (H) 06/19/2021   HDL 81 06/19/2021   LDLCALC 167 (H) 06/19/2021   TRIG 105 06/19/2021   CHOLHDL 3.2 04/30/2020   Lab Results  Component Value Date   VD25OH 37.3 06/19/2021   Lab Results  Component Value Date   WBC  5.5 04/30/2020   HGB 13.1 04/30/2020   HCT 40.0 04/30/2020   MCV 89.7 04/30/2020   PLT 221 04/30/2020   No results found for: IRON, TIBC, FERRITIN  Attestation Statements:   Reviewed by clinician on day of visit: allergies, medications, problem list, medical history, surgical history, family history, social history, and previous encounter notes.  I, Jackson Latino, RMA, am acting as Energy manager for [employer], DO.   I have reviewed the above documentation for accuracy and completeness, and I agree with the above. Corinna Capra, DO

## 2021-08-05 ENCOUNTER — Encounter (INDEPENDENT_AMBULATORY_CARE_PROVIDER_SITE_OTHER): Payer: Self-pay | Admitting: Bariatrics

## 2021-08-09 ENCOUNTER — Other Ambulatory Visit: Payer: Self-pay | Admitting: Family Medicine

## 2021-08-09 NOTE — Telephone Encounter (Signed)
Medication filled x1 with no refills.   Requires office visit before any further refills can be given.   Letter sent.  

## 2021-08-19 ENCOUNTER — Ambulatory Visit (INDEPENDENT_AMBULATORY_CARE_PROVIDER_SITE_OTHER): Payer: Medicare Other | Admitting: Bariatrics

## 2021-08-19 ENCOUNTER — Encounter (INDEPENDENT_AMBULATORY_CARE_PROVIDER_SITE_OTHER): Payer: Self-pay | Admitting: Bariatrics

## 2021-08-19 ENCOUNTER — Other Ambulatory Visit: Payer: Self-pay

## 2021-08-19 VITALS — BP 112/54 | HR 58 | Temp 97.9°F | Ht 60.0 in | Wt 261.0 lb

## 2021-08-19 DIAGNOSIS — Z6841 Body Mass Index (BMI) 40.0 and over, adult: Secondary | ICD-10-CM

## 2021-08-19 DIAGNOSIS — F5089 Other specified eating disorder: Secondary | ICD-10-CM

## 2021-08-19 DIAGNOSIS — E559 Vitamin D deficiency, unspecified: Secondary | ICD-10-CM | POA: Diagnosis not present

## 2021-08-19 MED ORDER — BUPROPION HCL ER (SR) 150 MG PO TB12: 150.0000 mg | ORAL_TABLET | Freq: Every day | ORAL | 0 refills | Status: DC

## 2021-08-19 MED ORDER — VITAMIN D (ERGOCALCIFEROL) 1.25 MG (50000 UNIT) PO CAPS: 50000.0000 [IU] | ORAL_CAPSULE | ORAL | 0 refills | Status: DC

## 2021-08-20 ENCOUNTER — Encounter (INDEPENDENT_AMBULATORY_CARE_PROVIDER_SITE_OTHER): Payer: Self-pay | Admitting: Bariatrics

## 2021-08-20 NOTE — Progress Notes (Signed)
Chief Complaint:   OBESITY Heather [name] is here to discuss her progress with her obesity treatment plan along with follow-up of her obesity related diagnoses. Heather [name] is on the Category 1 Plan and states she is following her eating plan approximately 100% of the time. Heather [name] states she is walking for 15-20 minutes 3 times per week.  Today's visit was #: 6 Starting weight: 275 lbs Starting date: 06/19/2021 Today's weight: 261 lbs Today's date: 08/19/2021 Total lbs lost to date: 14 lbs Total lbs lost since last in-office visit: 5 lbs  Interim History: Heather [name] is down an additional 5 lbs. She states that her last few weeks have not been bad.   Subjective:   1. Vitamin D insufficiency Heather [name] is currently taking Vitamin D.   2. Other disorder of eating Heather [name] is taking Wellbutrin as directed.   Assessment/Plan:   1. Vitamin D insufficiency Low Vitamin D level contributes to fatigue and are associated with obesity, breast, and colon cancer. We will refill prescription Vitamin D 50,000 IU every week for 1 month with no refills and Heather [name] will follow-up for routine testing of Vitamin D, at least 2-3 times per year to avoid over-replacement.  - Vitamin D, Ergocalciferol, (DRISDOL) 1.25 MG (50000 UNIT) CAPS capsule; Take 1 capsule (50,000 Units total) by mouth every 7 (seven) days.  Dispense: 4 capsule; Refill: 0  2. Other disorder of eating Behavior modification techniques were discussed today to help Heather [name] deal with her emotional/non-hunger eating behaviors.  We will refill Wellbutrin 150 mg for 1 month with no refills. Orders and follow up as documented in patient record.    - buPROPion (WELLBUTRIN SR) 150 MG 12 hr tablet; Take 1 tablet (150 mg total) by mouth daily.  Dispense: 30 tablet; Refill: 0  3. Class 3 severe obesity with serious comorbidity and body mass index (BMI) of 45.0 to 49.9 in adult, unspecified obesity type (HCC) Heather [name] is currently in the action stage of change.  As such, her goal is to continue with weight loss efforts. She has agreed to the Category 1 Plan.   Heather [name] will continue to follow the plan 80-100%. She will continue meal planning. She will continue to increase protein and water intake.   Exercise goals:  As is.  Behavioral modification strategies: increasing lean protein intake, decreasing simple carbohydrates, increasing vegetables, increasing water intake, decreasing eating out, no skipping meals, meal planning and cooking strategies, keeping healthy foods in the home, and planning for success.  Heather [name] has agreed to follow-up with our clinic in 2-3 weeks. She was informed of the importance of frequent follow-up visits to maximize her success with intensive lifestyle modifications for her multiple health conditions.   Objective:   Blood pressure (!) 112/54, pulse (!) 58, temperature 97.9 F (36.6 C), height 5' (1.524 m), weight 261 lb (118.4 kg), SpO2 99 %. Body mass index is 50.97 kg/m. Heather [name] is walking with a cane.  General: Cooperative, alert, well developed, in no acute distress. HEENT: Conjunctivae and lids unremarkable. Cardiovascular: Regular rhythm.  Lungs: Normal work of breathing. Neurologic: No focal deficits.   Lab Results  Component Value Date   CREATININE 0.94 06/19/2021   BUN 25 06/19/2021   NA 144 06/19/2021   K 4.6 06/19/2021   CL 102 06/19/2021   CO2 24 06/19/2021   Lab Results  Component Value Date   ALT 14 06/19/2021   AST 21 06/19/2021   ALKPHOS 126 (H) 06/19/2021   BILITOT 0.3 06/19/2021   Lab  Results  Component Value Date   HGBA1C 6.0 (H) 06/19/2021   HGBA1C 6.0 03/29/2018   HGBA1C 6.1 (H) 03/05/2015   Lab Results  Component Value Date   INSULIN 4.9 06/19/2021   Lab Results  Component Value Date   TSH 2.170 06/19/2021   Lab Results  Component Value Date   CHOL 266 (H) 06/19/2021   HDL 81 06/19/2021   LDLCALC 167 (H) 06/19/2021   TRIG 105 06/19/2021   CHOLHDL 3.2 04/30/2020    Lab Results  Component Value Date   VD25OH 37.3 06/19/2021   Lab Results  Component Value Date   WBC 5.5 04/30/2020   HGB 13.1 04/30/2020   HCT 40.0 04/30/2020   MCV 89.7 04/30/2020   PLT 221 04/30/2020   No results found for: IRON, TIBC, FERRITIN  Attestation Statements:   Reviewed by clinician on day of visit: allergies, medications, problem list, medical history, surgical history, family history, social history, and previous encounter notes.  I, Jackson Latino, RMA, am acting as Energy manager for [employer], DO.   I have reviewed the above documentation for accuracy and completeness, and I agree with the above. Corinna Capra, DO

## 2021-09-06 ENCOUNTER — Other Ambulatory Visit: Payer: Self-pay | Admitting: Family Medicine

## 2021-09-11 ENCOUNTER — Ambulatory Visit (INDEPENDENT_AMBULATORY_CARE_PROVIDER_SITE_OTHER): Payer: Medicare Other | Admitting: Bariatrics

## 2021-09-11 ENCOUNTER — Other Ambulatory Visit: Payer: Self-pay

## 2021-09-11 VITALS — BP 130/76 | HR 58 | Temp 97.8°F | Ht 60.0 in | Wt 265.0 lb

## 2021-09-11 DIAGNOSIS — E559 Vitamin D deficiency, unspecified: Secondary | ICD-10-CM

## 2021-09-11 DIAGNOSIS — Z6841 Body Mass Index (BMI) 40.0 and over, adult: Secondary | ICD-10-CM | POA: Diagnosis not present

## 2021-09-11 DIAGNOSIS — F5089 Other specified eating disorder: Secondary | ICD-10-CM

## 2021-09-11 MED ORDER — VITAMIN D (ERGOCALCIFEROL) 1.25 MG (50000 UNIT) PO CAPS: 50000.0000 [IU] | ORAL_CAPSULE | ORAL | 0 refills | Status: DC

## 2021-09-11 MED ORDER — BUPROPION HCL ER (SR) 200 MG PO TB12: 200.0000 mg | ORAL_TABLET | Freq: Every day | ORAL | 1 refills | Status: DC

## 2021-09-11 NOTE — Progress Notes (Signed)
Chief Complaint:   OBESITY Catheryne is here to discuss her progress with her obesity treatment plan along with follow-up of her obesity related diagnoses. Francenia is on the Category 1 Plan and states she is following her eating plan approximately 50% of the time. Aryia states she is walking for 20 minutes 3 times per week.  Today's visit was #: 7 Starting weight: 275 lbs Starting date: 06/19/2021 Today's weight: 265 lbs Today's date: 09/11/2021 Total lbs lost to date: 10 lbs Total lbs lost since last in-office visit: 0  Interim History: Yarelly is up 4 lbs since her last visit. She has gotten back on track.  Subjective:   1. Vitamin D insufficiency Shany is taking her medications as directed.  2. Other disorder of eating Shelsy is taking Wellbutrin as directed and she states Wellbutrin is helping some.  Assessment/Plan:   1. Vitamin D insufficiency Low Vitamin D level contributes to fatigue and are associated with obesity, breast, and colon cancer. We will refill prescription Vitamin D 50,000 IU every week for 1 month with no refills and Raea will follow-up for routine testing of Vitamin D, at least 2-3 times per year to avoid over-replacement.  - Vitamin D, Ergocalciferol, (DRISDOL) 1.25 MG (50000 UNIT) CAPS capsule; Take 1 capsule (50,000 Units total) by mouth every 7 (seven) days.  Dispense: 8 capsule; Refill: 0  2. Other disorder of eating Behavior modification techniques were discussed today to help Titania deal with her emotional/non-hunger eating behaviors.  We will refill Wellbutrin 200 mg for 1 month with no refills. Orders and follow up as documented in patient record.    - buPROPion (WELLBUTRIN SR) 200 MG 12 hr tablet; Take 1 tablet (200 mg total) by mouth daily.  Dispense: 30 tablet; Refill: 1  3. Obesity, current BMI 51.8 Jeneen is currently in the action stage of change. As such, her goal is to continue with weight loss efforts. She has agreed to the  Category 1 Plan.   Tavia will continue meal planning and she will continue intentional eating.  Exercise goals:  Janyiah will be as active as possible.  Behavioral modification strategies: increasing lean protein intake, decreasing simple carbohydrates, increasing vegetables, increasing water intake, decreasing eating out, no skipping meals, meal planning and cooking strategies, keeping healthy foods in the home, and planning for success.  Rian has agreed to follow-up with our clinic in 4 weeks with Adah Salvage, FNP or William Hamburger, NP and 8 weeks with myself . She was informed of the importance of frequent follow-up visits to maximize her success with intensive lifestyle modifications for her multiple health conditions.   Objective:   Blood pressure 130/76, pulse (!) 58, temperature 97.8 F (36.6 C), height 5' (1.524 m), weight 265 lb (120.2 kg), SpO2 98 %. Body mass index is 51.75 kg/m. Takenya is using a cane for ambulation.  General: Cooperative, alert, well developed, in no acute distress. HEENT: Conjunctivae and lids unremarkable. Cardiovascular: Regular rhythm.  Lungs: Normal work of breathing. Neurologic: No focal deficits.   Lab Results  Component Value Date   CREATININE 0.94 06/19/2021   BUN 25 06/19/2021   NA 144 06/19/2021   K 4.6 06/19/2021   CL 102 06/19/2021   CO2 24 06/19/2021   Lab Results  Component Value Date   ALT 14 06/19/2021   AST 21 06/19/2021   ALKPHOS 126 (H) 06/19/2021   BILITOT 0.3 06/19/2021   Lab Results  Component Value Date   HGBA1C 6.0 (H)  06/19/2021   HGBA1C 6.0 03/29/2018   HGBA1C 6.1 (H) 03/05/2015   Lab Results  Component Value Date   INSULIN 4.9 06/19/2021   Lab Results  Component Value Date   TSH 2.170 06/19/2021   Lab Results  Component Value Date   CHOL 266 (H) 06/19/2021   HDL 81 06/19/2021   LDLCALC 167 (H) 06/19/2021   TRIG 105 06/19/2021   CHOLHDL 3.2 04/30/2020   Lab Results  Component Value Date    VD25OH 37.3 06/19/2021   Lab Results  Component Value Date   WBC 5.5 04/30/2020   HGB 13.1 04/30/2020   HCT 40.0 04/30/2020   MCV 89.7 04/30/2020   PLT 221 04/30/2020   No results found for: IRON, TIBC, FERRITIN  Attestation Statements:   Reviewed by clinician on day of visit: allergies, medications, problem list, medical history, surgical history, family history, social history, and previous encounter notes.  I, Jackson Latino, RMA, am acting as Energy manager for [employer], DO.   I have reviewed the above documentation for accuracy and completeness, and I agree with the above. Corinna Capra, DO

## 2021-10-01 ENCOUNTER — Encounter (INDEPENDENT_AMBULATORY_CARE_PROVIDER_SITE_OTHER): Payer: Self-pay | Admitting: Bariatrics

## 2021-10-03 ENCOUNTER — Other Ambulatory Visit: Payer: Self-pay

## 2021-10-03 ENCOUNTER — Other Ambulatory Visit: Payer: Self-pay | Admitting: Family Medicine

## 2021-10-03 ENCOUNTER — Ambulatory Visit (INDEPENDENT_AMBULATORY_CARE_PROVIDER_SITE_OTHER): Payer: Medicare Other | Admitting: Adult Health

## 2021-10-03 ENCOUNTER — Encounter (INDEPENDENT_AMBULATORY_CARE_PROVIDER_SITE_OTHER): Payer: Self-pay | Admitting: Adult Health

## 2021-10-03 VITALS — BP 100/64 | HR 57 | Temp 97.5°F | Ht 65.0 in | Wt 258.0 lb

## 2021-10-03 DIAGNOSIS — Z6841 Body Mass Index (BMI) 40.0 and over, adult: Secondary | ICD-10-CM

## 2021-10-03 DIAGNOSIS — F5089 Other specified eating disorder: Secondary | ICD-10-CM

## 2021-10-03 DIAGNOSIS — I10 Essential (primary) hypertension: Secondary | ICD-10-CM

## 2021-10-03 MED ORDER — LOSARTAN POTASSIUM 50 MG PO TABS: 50.0000 mg | ORAL_TABLET | Freq: Every day | ORAL | 0 refills | Status: DC

## 2021-10-03 NOTE — Progress Notes (Signed)
Chief Complaint:   OBESITY Heather [name] is here to discuss her progress with her obesity treatment plan along with follow-up of her obesity related diagnoses. Heather [name] is on the Category 1 Plan and states she is following her eating plan approximately 90% of the time. Heather [name] states she is doing strength training for 15-20 minutes 3 times per week.  Today's visit was #: 8 Starting weight: 275 lbs Starting date: 06/19/2021 Today's weight: 258 lbs Today's date: 10/03/2021 Total lbs lost to date: 17 lbs Total lbs lost since last in-office visit: 7 lbs  Interim History:  Heather [name] has been doing strength training - free weights at home, came with stationary bike.   She has not been riding bike, but would like to get back into a riding routine. 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 Part of home school group- [address] based out of [place], [place]. He is dropped off at 0620-1600 pickup- by his father. Part of home school group.  Subjective:   1. Essential hypertension BP soft at office visit - 100/64. She is on  Cardizem 120mg  QD and Lopressor 25mg  1/2 tab QD- Managed by Cardiology.   Lasix 40mg  QD (recently decreased from 80mg  QD) and Cozaar 100mg  - PCP. She is experiencing dizziness with position changes. Formerly pressure readings were SBP 120-130, DBP 70-80  2. Other disorder of eating She is on bupropion SR 200 mg daily - denies SI/HI. She reports decreased cravings. She denies history of seizure disorder.  Assessment/Plan:   1. Essential hypertension Remain on all BP prescriptions -with one change - decrease losartan from 100 mg to 50 mg daily.    - Decrease and refill losartan (COZAAR) 50 MG tablet; Take 1 tablet (50 mg total) by mouth daily.  Dispense: 30 tablet; Refill: 0  2. Other disorder of eating Continue bupropion SR 200 mg.  No need for refill today.  3. Obesity, current BMI 43.0  Heather [name] is currently in the  action stage of change. As such, her goal is to continue with weight loss efforts. She has agreed to the Category 1 Plan.   Exercise goals:  As is.  Behavioral modification strategies: increasing lean protein intake, decreasing simple carbohydrates, meal planning and cooking strategies, keeping healthy foods in the home, and planning for success.  Heather [name] has agreed to follow-up with our clinic in 3 weeks. She was informed of the importance of frequent follow-up visits to maximize her success with intensive lifestyle modifications for her multiple health conditions.   Objective:   Blood pressure 100/64, pulse (!) 57, temperature (!) 97.5 F (36.4 C), height 5\' 5"  (1.651 m), weight 258 lb (117 kg), SpO2 97 %. Body mass index is 42.93 kg/m.  General: Cooperative, alert, well developed, in no acute distress. HEENT: Conjunctivae and lids unremarkable. Cardiovascular: Regular rhythm.  Lungs: Normal work of breathing. Neurologic: No focal deficits.   Lab Results  Component Value Date   CREATININE 0.94 06/19/2021   BUN 25 06/19/2021   NA 144 06/19/2021   K 4.6 06/19/2021   CL 102 06/19/2021   CO2 24 06/19/2021   Lab Results  Component Value Date   ALT 14 06/19/2021   AST 21 06/19/2021   ALKPHOS 126 (H) 06/19/2021   BILITOT 0.3 06/19/2021   Lab Results  Component Value Date   HGBA1C 6.0 (H) 06/19/2021   HGBA1C 6.0 03/29/2018   HGBA1C 6.1 (H) 03/05/2015   Lab Results  Component Value Date   INSULIN 4.9 06/19/2021   Lab Results  Component Value Date   TSH 2.170 06/19/2021   Lab Results  Component Value Date   CHOL 266 (H) 06/19/2021   HDL 81 06/19/2021   LDLCALC 167 (H) 06/19/2021   TRIG 105 06/19/2021   CHOLHDL 3.2 04/30/2020   Lab Results  Component Value Date   VD25OH 37.3 06/19/2021   Lab Results  Component Value Date   WBC 5.5 04/30/2020   HGB 13.1 04/30/2020   HCT 40.0 04/30/2020   MCV 89.7 04/30/2020   PLT 221 04/30/2020   Obesity Behavioral  Intervention:   Approximately 15 minutes were spent on the discussion below.  ASK: We discussed the diagnosis of obesity with Heather [name] today and Heather [name] agreed to give [place] permission to discuss obesity behavioral modification therapy today.  ASSESS: Heather [name] has the diagnosis of obesity and her BMI today is 43.0. Heather [name] is in the action stage of change.   ADVISE: Heather [name] was educated on the multiple health risks of obesity as well as the benefit of weight loss to improve her health. She was advised of the need for long term treatment and the importance of lifestyle modifications to improve her current health and to decrease her risk of future health problems.  AGREE: Multiple dietary modification options and treatment options were discussed and Heather [name] agreed to follow the recommendations documented in the above note.  ARRANGE: Heather [name] was educated on the importance of frequent visits to treat obesity as outlined per CMS and USPSTF guidelines and agreed to schedule her next follow up appointment today.  Attestation Statements:   Reviewed by clinician on day of visit: allergies, medications, problem list, medical history, surgical history, family history, social history, and previous encounter notes.  I, Insurance claims handler, CMA, am acting as Energy manager for William Hamburger, NP.  I have reviewed the above documentation for accuracy and completeness, and I agree with the above. -  Tachina Spoonemore d. Shaton Lore, NP-C

## 2021-10-10 ENCOUNTER — Other Ambulatory Visit (INDEPENDENT_AMBULATORY_CARE_PROVIDER_SITE_OTHER): Payer: Self-pay | Admitting: Bariatrics

## 2021-10-10 DIAGNOSIS — F5089 Other specified eating disorder: Secondary | ICD-10-CM

## 2021-10-31 ENCOUNTER — Ambulatory Visit (INDEPENDENT_AMBULATORY_CARE_PROVIDER_SITE_OTHER): Payer: Medicare Other | Admitting: Adult Health

## 2021-10-31 ENCOUNTER — Other Ambulatory Visit (INDEPENDENT_AMBULATORY_CARE_PROVIDER_SITE_OTHER): Payer: Self-pay | Admitting: Adult Health

## 2021-10-31 ENCOUNTER — Encounter (INDEPENDENT_AMBULATORY_CARE_PROVIDER_SITE_OTHER): Payer: Self-pay | Admitting: Adult Health

## 2021-10-31 ENCOUNTER — Other Ambulatory Visit: Payer: Self-pay

## 2021-10-31 ENCOUNTER — Other Ambulatory Visit: Payer: Self-pay | Admitting: Family Medicine

## 2021-10-31 VITALS — BP 125/74 | HR 61 | Temp 98.0°F | Ht 65.0 in | Wt 259.0 lb

## 2021-10-31 DIAGNOSIS — E559 Vitamin D deficiency, unspecified: Secondary | ICD-10-CM | POA: Diagnosis not present

## 2021-10-31 DIAGNOSIS — I10 Essential (primary) hypertension: Secondary | ICD-10-CM

## 2021-10-31 DIAGNOSIS — R7303 Prediabetes: Secondary | ICD-10-CM

## 2021-10-31 DIAGNOSIS — E669 Obesity, unspecified: Secondary | ICD-10-CM | POA: Diagnosis not present

## 2021-10-31 DIAGNOSIS — Z6841 Body Mass Index (BMI) 40.0 and over, adult: Secondary | ICD-10-CM | POA: Diagnosis not present

## 2021-10-31 NOTE — Telephone Encounter (Signed)
LOV Katy

## 2021-11-04 DIAGNOSIS — E559 Vitamin D deficiency, unspecified: Secondary | ICD-10-CM | POA: Insufficient documentation

## 2021-11-04 NOTE — Progress Notes (Signed)
Chief Complaint:   OBESITY Heather [name] is here to discuss her progress with her obesity treatment plan along with follow-up of her obesity related diagnoses. Heather [name] is on the Category 1 Plan and states she is following her eating plan approximately 95% of the time. Heather [name] states she is not exercising regularly at this time.  Today's visit was #: 9 Starting weight: 275 lbs Starting date: 06/19/2021 Today's weight: 259 lbs Today's date: 10/31/2021 Total lbs lost to date: 16 lbs Total lbs lost since last in-office visit: 0  Interim History:  Heather [name]'s 2023 Health/Wellness Goals: 1) Increase energy levels.  2) Establish a new relationship with food, so as "it doesn't matter so much to me".  Of note:  Last time, when she followed low carbohydrate eating plan after bariatric surgery, she lost 150 pounds.  Of note:  Orthopedic Surgical Hx: Bilateral hip replacements.  Right knee replacement.  Subjective:   1. Prediabetes Family history of T2D - father, brother, sister. Father passed from complications of Alzheimer's - in his 41s. She reports increased polyphagia.   She has never been on a medication to lower BG.  2. Vitamin D insufficiency Vitamin D level on 06/19/2021 - 37.3 - below goal of 50. She is currently taking prescription ergocalciferol 50,000 IU each week. She denies nausea, vomiting or muscle weakness.  Assessment/Plan:   1. Prediabetes Check labs at next office visit. Change to low carbohydrate plan.  2. Vitamin D insufficiency Check labs at next office visit.  3. Obesity, current BMI 43.1  Heather [name] is currently in the action stage of change. As such, her goal is to continue with weight loss efforts. She has agreed to following a lower carbohydrate, vegetable and lean protein rich diet plan.   Check fasting labs at next office visit.  Exercise goals: No exercise has been prescribed at this time.  Behavioral modification strategies: increasing lean protein  intake, decreasing simple carbohydrates, meal planning and cooking strategies, keeping healthy foods in the home, better snacking choices, and planning for success.  Heather [name] has agreed to follow-up with our clinic in 2-3 weeks. She was informed of the importance of frequent follow-up visits to maximize her success with intensive lifestyle modifications for her multiple health conditions.   Objective:   Blood pressure 125/74, pulse 61, temperature 98 F (36.7 C), height 5\' 5"  (1.651 m), weight 259 lb (117.5 kg), SpO2 96 %. Body mass index is 43.1 kg/m.  General: Cooperative, alert, well developed, in no acute distress. HEENT: Conjunctivae and lids unremarkable. Cardiovascular: Regular rhythm.  Lungs: Normal work of breathing. Neurologic: No focal deficits.   Lab Results  Component Value Date   CREATININE 0.94 06/19/2021   BUN 25 06/19/2021   NA 144 06/19/2021   K 4.6 06/19/2021   CL 102 06/19/2021   CO2 24 06/19/2021   Lab Results  Component Value Date   ALT 14 06/19/2021   AST 21 06/19/2021   ALKPHOS 126 (H) 06/19/2021   BILITOT 0.3 06/19/2021   Lab Results  Component Value Date   HGBA1C 6.0 (H) 06/19/2021   HGBA1C 6.0 03/29/2018   HGBA1C 6.1 (H) 03/05/2015   Lab Results  Component Value Date   INSULIN 4.9 06/19/2021   Lab Results  Component Value Date   TSH 2.170 06/19/2021   Lab Results  Component Value Date   CHOL 266 (H) 06/19/2021   HDL 81 06/19/2021   LDLCALC 167 (H) 06/19/2021   TRIG 105 06/19/2021   CHOLHDL 3.2 04/30/2020  Lab Results  Component Value Date   VD25OH 37.3 06/19/2021   Lab Results  Component Value Date   WBC 5.5 04/30/2020   HGB 13.1 04/30/2020   HCT 40.0 04/30/2020   MCV 89.7 04/30/2020   PLT 221 04/30/2020   Attestation Statements:   Reviewed by clinician on day of visit: allergies, medications, problem list, medical history, surgical history, family history, social history, and previous encounter notes.  Time spent on  visit including pre-visit chart review and post-visit care and charting was 28 minutes.   I, Insurance claims handler, CMA, am acting as Energy manager for William Hamburger, NP.  I have reviewed the above documentation for accuracy and completeness, and I agree with the above. -  Jeremie Giangrande d. Kerston Landeck, NP-C

## 2021-11-07 ENCOUNTER — Telehealth: Payer: Self-pay

## 2021-11-07 MED ORDER — AMITRIPTYLINE HCL 10 MG PO TABS: ORAL_TABLET | ORAL | 0 refills | Status: DC

## 2021-11-07 NOTE — Telephone Encounter (Signed)
Pt called in to schedule an appt, and would like to know if she could get a refill of amitriptyline (ELAVIL) 10 MG tablet  just enough to last until her appt on 01/30 with pcp.  Cb#: [phone]

## 2021-11-07 NOTE — Telephone Encounter (Signed)
Per chart pt had last refill of Amitriptyline on 08/09/21, #30, 0 refills. Per pt she not run out of medication. Pt states she must have had some "extra refills" at the pharmacy. Discontinued meds do show refill on 04/25/21, #90, 1 refill. It is possible pt may still have meds from the previous rx. Advised pt I will send in #30 to get her to her upcoming appt. Pt voiced understanding.

## 2021-11-08 ENCOUNTER — Other Ambulatory Visit (INDEPENDENT_AMBULATORY_CARE_PROVIDER_SITE_OTHER): Payer: Self-pay | Admitting: Bariatrics

## 2021-11-08 DIAGNOSIS — F5089 Other specified eating disorder: Secondary | ICD-10-CM

## 2021-11-11 ENCOUNTER — Ambulatory Visit: Payer: Medicare Other | Admitting: Family Medicine

## 2021-11-11 ENCOUNTER — Other Ambulatory Visit (INDEPENDENT_AMBULATORY_CARE_PROVIDER_SITE_OTHER): Payer: Self-pay | Admitting: Adult Health

## 2021-11-11 DIAGNOSIS — F5089 Other specified eating disorder: Secondary | ICD-10-CM

## 2021-11-11 MED ORDER — BUPROPION HCL ER (SR) 200 MG PO TB12: 200.0000 mg | ORAL_TABLET | Freq: Every day | ORAL | 0 refills | Status: DC

## 2021-11-11 NOTE — Telephone Encounter (Signed)
LAST APPOINTMENT DATE: 10/31/21 NEXT APPOINTMENT DATE: 11/21/21   [id number] [place], [place] - [address] [place] [address] Phone: [phone] Fax: [phone]  [facility] - [place], [place] - [address] [place] [address] [place] [address] Phone: [phone] Fax: [phone]  Patient is requesting a refill of the following medications: Pending Prescriptions:                       Disp   Refills   buPROPion (WELLBUTRIN SR) 200 MG 12 hr tab*90 tab*1       Sig: TAKE 1 TABLET BY MOUTH EVERY DAY   Date last filled: 09/11/21 Previously prescribed by Dr.Brown  Lab Results      Component                Value               Date                      HGBA1C                   6.0 (H)             06/19/2021                HGBA1C                   6.0                 03/29/2018                HGBA1C                   6.1 (H)             03/05/2015           Lab Results      Component                Value               Date                      LDLCALC                  167 (H)             06/19/2021                CREATININE               0.94                06/19/2021           Lab Results      Component                Value               Date                      VD25OH                   37.3                06/19/2021  BP Readings from Last 3 Encounters: 10/31/21 : 125/74 10/03/21 : 100/64 09/11/21 : 130/76

## 2021-11-13 ENCOUNTER — Telehealth (INDEPENDENT_AMBULATORY_CARE_PROVIDER_SITE_OTHER): Payer: Medicare Other | Admitting: Nurse Practitioner

## 2021-11-13 ENCOUNTER — Other Ambulatory Visit: Payer: Self-pay

## 2021-11-13 DIAGNOSIS — Z20822 Contact with and (suspected) exposure to covid-19: Secondary | ICD-10-CM

## 2021-11-13 DIAGNOSIS — J069 Acute upper respiratory infection, unspecified: Secondary | ICD-10-CM

## 2021-11-13 DIAGNOSIS — R051 Acute cough: Secondary | ICD-10-CM

## 2021-11-13 DIAGNOSIS — R062 Wheezing: Secondary | ICD-10-CM

## 2021-11-13 MED ORDER — ALBUTEROL SULFATE HFA 108 (90 BASE) MCG/ACT IN AERS: 2.0000 | INHALATION_SPRAY | Freq: Four times a day (QID) | RESPIRATORY_TRACT | 0 refills | Status: DC | PRN

## 2021-11-13 MED ORDER — MOLNUPIRAVIR EUA 200MG CAPSULE: 4.0000 | ORAL_CAPSULE | Freq: Two times a day (BID) | ORAL | 0 refills | Status: AC

## 2021-11-13 MED ORDER — HYDROCOD POLI-CHLORPHE POLI ER 10-8 MG/5ML PO SUER: 5.0000 mL | Freq: Two times a day (BID) | ORAL | 0 refills | Status: AC | PRN

## 2021-11-13 NOTE — Progress Notes (Signed)
Subjective:    Patient ID: Heather [name], female    DOB: 1951/07/14, 71 y.o.   MRN: [id number]  HPI: Heather [name] is a 70 y.o. female presenting virtually for exposure to COVID-19.  Chief Complaint  Patient presents with   Covid Exposure   UPPER RESPIRATORY TRACT INFECTION Onset: Tuesday COVID-19 testing history: negative today at home Fever: no Chills: yes Body aches: yes Cough: yes; congested & nonproductive Shortness of breath: no Wheezing: yes; was wheezing this morning Chest pain: no Chest tightness: no Chest congestion: yes Nasal congestion: yes Runny nose: yes Post nasal drip: yes Sneezing: no Sore throat: no Swollen glands: yes Sinus pressure: yes Headache: no Face pain: no Toothache: no Ear pain: no  Ear pressure: no  Eyes red/itching:no Eye drainage/crusting: no  Nausea: no  Vomiting: no Diarrhea: no  Change in appetite: no  Loss of taste/smell:  yes; decreased   Rash: no Fatigue:  yes; decreased Sick contacts: yes; husband has COVID this week Strep contacts:  no Context: worse Recurrent sinusitis: no Treatments attempted: Vicks, Mucinex Relief with OTC medications: no  Allergies  Allergen Reactions   Cephalexin Anaphylaxis and Hives    "throat started closing up"   Rocephin [Ceftriaxone Sodium In Dextrose] Anaphylaxis   Penicillins Rash    Has patient had a PCN reaction causing immediate rash, facial/tongue/throat swelling, SOB or lightheadedness with hypotension: no Has patient had a PCN reaction causing severe rash involving mucus membranes or skin necrosis: no Has patient had a PCN reaction that required hospitalization no Has patient had a PCN reaction occurring within the last 10 years: no If all of the above answers are "NO", then may proceed with Cephalosporin use.   Flecainide Acetate Other (See Comments)    Jittery    Moxifloxacin     Does not remember the reaction   Statins     Memory issues   Sulfonamide  Derivatives     Does not remember reaction ; "was so young when I had reaction to it"    Outpatient Encounter Medications as of 11/13/2021  Medication Sig   albuterol (VENTOLIN HFA) 108 (90 Base) MCG/ACT inhaler Inhale 2 puffs into the lungs every 6 (six) hours as needed for wheezing or shortness of breath.   amitriptyline (ELAVIL) 10 MG tablet TAKE 1 TABLET BY MOUTH EVERYDAY AT BEDTIME   buPROPion (WELLBUTRIN SR) 200 MG 12 hr tablet Take 1 tablet (200 mg total) by mouth daily.   chlorpheniramine-HYDROcodone (TUSSIONEX PENNKINETIC ER) 10-8 MG/5ML Take 5 mLs by mouth every 12 (twelve) hours as needed for cough.   Cholecalciferol (VITAMIN D) 2000 UNITS tablet Take 2,000 Units by mouth daily.   diltiazem (CARDIZEM CD) 120 MG 24 hr capsule Take 120 mg by mouth daily.    ELIQUIS 5 MG TABS tablet TAKE 1 TABLET BY MOUTH TWICE A DAY....NEED OFFICE VISIT BEFORE ANY MORE REFILLS (Patient taking differently: Take 5 mg by mouth 2 (two) times daily.)   furosemide (LASIX) 40 MG tablet TAKE 2 TABLETS BY MOUTH EVERY DAY IN THE MORNING   hyoscyamine (LEVSIN) 0.125 MG tablet TAKE 1 TABLET BY MOUTH EVERY 4 HOURS AS NEEDED   levothyroxine (SYNTHROID) 75 MCG tablet TAKE 1 TABLET BY MOUTH EVERY DAY BEFORE BREAKFAST   losartan (COZAAR) 50 MG tablet Take 1 tablet (50 mg total) by mouth daily.   Magnesium 250 MG TABS Take 250 mg by mouth 2 (two) times daily.   metoprolol tartrate (LOPRESSOR) 25 MG tablet Take 12.5  mg by mouth 2 (two) times daily.    molnupiravir EUA (LAGEVRIO) 200 mg CAPS capsule Take 4 capsules (800 mg total) by mouth 2 (two) times daily for 5 days.   MULTAQ 400 MG tablet Take 400 mg by mouth 2 (two) times daily.   vitamin C (ASCORBIC ACID) 500 MG tablet Take 500 mg by mouth daily.   Vitamin D, Ergocalciferol, (DRISDOL) 1.25 MG (50000 UNIT) CAPS capsule Take 1 capsule (50,000 Units total) by mouth every 7 (seven) days.   [DISCONTINUED] montelukast (SINGULAIR) 10 MG tablet TAKE 1 TABLET BY MOUTH  EVERYDAY AT BEDTIME   [DISCONTINUED] Multiple Vitamins-Minerals (BARIATRIC MULTIVITAMINS/IRON PO) Take 1 tablet by mouth daily. (Patient not taking: Reported on 11/13/2021)   No facility-administered encounter medications on file as of 11/13/2021.    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   Morbid obesity with BMI of 50.0-59.9, adult (HCC) 05/24/2018   RML pneumonia 03/29/2018   Lymphedema of both lower extremities 08/24/2017   Venous insufficiency of both lower extremities 04/27/2017   Edema 04/27/2017   Interstitial pneumonia (HCC) 03/02/2017   Chronic pain of right knee 02/23/2017   Unilateral primary osteoarthritis, right knee 02/23/2017   Chronic diastolic congestive heart failure (HCC) 01/29/2017   Status post arthroscopy of right knee 11/25/2016   Acute medial meniscal tear, right, initial encounter 11/06/2016   Pulmonary hypertension (HCC) 03/22/2015   Sleep apnea    Atypical atrial flutter (HCC) 11/27/2013   History of laparoscopic adjustable gastric banding, APS, 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  [facility].  Heather [name] is off coumadin  now with no noted recurrent atrial fibrillation since 2007. til  04/01/12; s/p initiation of Rhythmol with DCCV June 2013; reports 05-18-18 that Heather [name] is in aflutter    Back pain    Carpal tunnel syndrome    Degenerative disk disease    Edema, lower extremity    GERD (gastroesophageal reflux disease)    resolved after lap band   History of blood transfusion    "w/both hip replacements"   HTN (hypertension)    Hypothyroidism    IBS (irritable bowel syndrome)    Interstitial cystitis    Joint pain    Lactose intolerance    Morton's neuroma    right foot   Obesity    On home oxygen therapy    2L continuous daytime; at night uses 3L with CPAP at nighttime    Palpitations    Pneumonitis dx 1 year ago   cryptogenic organizing pneumonitis; treated with prednisone    Posterior tibial tendinitis    Pulmonary hypertension (HCC)    Shortness of breath    only without oxygen   Sleep apnea 04/01/2012   "  dx'd just last week"   Sleep apnea    uses CPAP, 11 CWP with residual AHI 6.3   Vitamin D deficiency     Relevant past medical, surgical, family and social history reviewed and updated as indicated. Interim medical history since our last visit reviewed.  Review of Systems Per HPI unless specifically indicated above     Objective:    There were no vitals taken for this visit.  Wt Readings from Last 3 Encounters:  10/31/21 259 lb (117.5 kg)  10/03/21 258 lb (117 kg)  09/11/21 265 lb (120.2 kg)    Physical Exam Vitals and nursing note reviewed.  Constitutional:      General: Heather [name] is not in acute distress.    Appearance: Normal appearance. Heather [name] is not ill-appearing, toxic-appearing or diaphoretic.  HENT:     Head: Normocephalic and atraumatic.     Nose: Congestion present. No rhinorrhea.     Mouth/Throat:     Mouth: Mucous membranes are moist.     Pharynx: Oropharynx is clear.  Eyes:     General: No scleral icterus.       Right eye: No discharge.        Left eye: No discharge.     Extraocular Movements: Extraocular movements intact.   Cardiovascular:     Comments: Unable to assess heart sounds via virtual visit. Pulmonary:     Effort: Pulmonary effort is normal. No respiratory distress.     Comments: Unable to assess breath sounds via virtual visit; patient talking in complete sentences during telemedicine visit without accessory muscle use. Skin:    Coloration: Skin is not jaundiced or pale.     Findings: No erythema.  Neurological:     Mental Status: Heather [name] is alert and oriented to person, place, and time.  Psychiatric:        Mood and Affect: Mood normal.        Behavior: Behavior normal.        Thought Content: Thought content normal.        Judgment: Judgment normal.      Assessment & Plan:  1. Exposure to COVID-19 virus Acute.  Given close exposure and symptoms present, treat with molnupiravir.  Discussed isolation guidelines.  Push fluids.  Start Coricidin for symptom management.  Also consider nasal saline rinses, humidifier.  Symptoms should gradually improve over next 1-2 weeks.  Notify us if they do not.  With any sudden onset new chest pain, dizziness, sweating, or shortness of breath, go to ED.  - molnupiravir EUA (LAGEVRIO) 200 mg CAPS capsule; Take 4 capsules (800 mg total) by mouth 2 (two) times daily for 5 days.  Dispense: 40 capsule; Refill: 0  2. Viral upper respiratory tract infection Acute.  Suspect COVID-19 with recent exposure.  Reassured patient that symptoms and exam findings are most consistent with a viral upper respiratory infection and explained lack of efficacy of antibiotics against viruses.  Discussed expected course and features suggestive of secondary bacterial infection.  Continue supportive care. Increase fluid intake with water or electrolyte solution like pedialyte. Encouraged acetaminophen as needed for fever/pain. Encouraged salt water gargling, chloraseptic spray and throat lozenges. Encouraged OTC guaifenesin. Encouraged saline sinus flushes and/or neti with humidified air.    -  molnupiravir EUA (LAGEVRIO) 200 mg CAPS capsule; Take 4 capsules (800 mg total) by mouth 2 (two) times daily for 5 days.  Dispense: 40 capsule; Refill: 0  3. Acute cough Acute.  Can use tussionex for cough.  PDMP  reviewed and patient not on other high risk medication.  Advised close monitoring while taking medication.  Do not drive with medication.  - chlorpheniramine-HYDROcodone (TUSSIONEX PENNKINETIC ER) 10-8 MG/5ML; Take 5 mLs by mouth every 12 (twelve) hours as needed for cough.  Dispense: 60 mL; Refill: 0  4. Wheezing Give refill for albuterol.  Notify us if wheezing persists.   - albuterol (VENTOLIN HFA) 108 (90 Base) MCG/ACT inhaler; Inhale 2 puffs into the lungs every 6 (six) hours as needed for wheezing or shortness of breath.  Dispense: 8 g; Refill: 0    Follow up plan: Return if symptoms worsen or fail to improve.  Due to the catastrophic nature of the COVID-19 pandemic, this video visit was completed soley via audio and visual contact via Care[facility] due to the restrictions of the COVID-19 pandemic.  All issues as above were discussed and addressed. Physical exam was done as above through visual confirmation on Caregility. If it was felt that the patient should be evaluated in the office, they were directed there. The patient verbally consented to this visit. Location of the patient: home Location of the provider: work Those involved with this call:  Provider: Cathlean Marseilles, DNP, FNP-C CMA: Darrol Angel, CMA Front Desk/Registration: Claudine Mouton  Time spent on call:  6 minutes with patient face to face via video conference. More than 50% of this time was spent in counseling and coordination of care. 13 minutes total spent in review of patient's record and preparation of their chart. I verified patient identity using two factors (patient name and date of birth). Patient consents verbally to being seen via telemedicine visit today.

## 2021-11-18 ENCOUNTER — Encounter: Payer: Self-pay | Admitting: Family Medicine

## 2021-11-18 ENCOUNTER — Ambulatory Visit (INDEPENDENT_AMBULATORY_CARE_PROVIDER_SITE_OTHER): Payer: Medicare Other | Admitting: Family Medicine

## 2021-11-18 ENCOUNTER — Other Ambulatory Visit: Payer: Self-pay

## 2021-11-18 VITALS — BP 112/72 | HR 71 | Temp 97.6°F | Resp 18 | Ht 65.0 in | Wt 250.0 lb

## 2021-11-18 DIAGNOSIS — I10 Essential (primary) hypertension: Secondary | ICD-10-CM

## 2021-11-18 DIAGNOSIS — Z1231 Encounter for screening mammogram for malignant neoplasm of breast: Secondary | ICD-10-CM | POA: Diagnosis not present

## 2021-11-18 DIAGNOSIS — E039 Hypothyroidism, unspecified: Secondary | ICD-10-CM | POA: Diagnosis not present

## 2021-11-18 DIAGNOSIS — R7303 Prediabetes: Secondary | ICD-10-CM | POA: Diagnosis not present

## 2021-11-18 MED ORDER — EZETIMIBE 10 MG PO TABS: 10.0000 mg | ORAL_TABLET | Freq: Every day | ORAL | 3 refills | Status: AC

## 2021-11-18 NOTE — Progress Notes (Signed)
Subjective:    Patient ID: Heather [name], female    DOB: 1951/08/10, 71 y.o.   MRN: [id number]  HPI  Patient is a 71 year old Caucasian female who has a history of atrial fibrillation, pulmonary hypertension, hypertension, prediabetes, and hypothyroidism.  I have not seen her since 2021.  She is overdue for mammogram.  She agrees to allow me to schedule her for this.  She is uncertain when she last had a colonoscopy she believes that [facility] perform the colonoscopy.  She is overdue for a bone density test.  However she gets her bone density test with her gynecologist.  Her blood pressure today is excellent at 112/72.  She had fasting lab work in September that showed prediabetes as well as hyperlipidemia with an LDL cholesterol greater than 160.  She is unable to tolerate statins.  She has never tried Zetia Past Medical History:  Diagnosis Date   Anemia    "onc;e; had to take iron for awhile"   Atrial fibrillation ([place])    Began in 2004.  Had ablations at [facility] in 2005 and 2007.  She is off coumadin  now with no noted recurrent atrial fibrillation since 2007. til 04/01/12; s/p initiation of Rhythmol with DCCV June 2013; reports 05-18-18 that she is in aflutter    Back pain    Carpal tunnel syndrome    Degenerative disk disease    Edema, lower extremity    GERD (gastroesophageal reflux disease)    resolved after lap band   History of blood transfusion    "w/both hip replacements"   HTN (hypertension)    Hypothyroidism    IBS (irritable bowel syndrome)    Interstitial cystitis    Joint pain    Lactose intolerance    Morton's neuroma    right foot   Obesity    On home oxygen therapy    2L continuous daytime; at night uses 3L with CPAP at nighttime    Palpitations    Pneumonitis dx 1 year ago   cryptogenic organizing pneumonitis; treated with prednisone    Posterior tibial tendinitis    Pulmonary hypertension ([place])    Shortness of breath    only without oxygen   Sleep  apnea 04/01/2012   "dx'd just last week"   Sleep apnea    uses CPAP, 11 CWP with residual AHI 6.3   Vitamin D deficiency    Past Surgical History:  Procedure Laterality Date   ANTERIOR AND POSTERIOR REPAIR  10/20/2012   Procedure: ANTERIOR (CYSTOCELE) AND POSTERIOR REPAIR (RECTOCELE);  Surgeon: Alwyn Pea, MD;  Location: [facility];  Service: Gynecology;  Laterality: N/A;  2 hours   ATRIAL FIBRILLATION ABLATION N/A 12/30/2012   Procedure: ATRIAL FIBRILLATION ABLATION;  Surgeon: Thompson Grayer, MD;  [facility];  Service: Cardiovascular;  Laterality: N/A;   BACK SURGERY     CARDIAC CATHETERIZATION     CARDIAC ELECTROPHYSIOLOGY MAPPING AND ABLATION  ~ 2005 and 2007   "did more 2nd time; both at [facility]"   CARDIOVERSION  04/02/2012   Procedure: CARDIOVERSION;  Surgeon: Larey Dresser, MD;  Location: [place];  Service: Cardiovascular;  Laterality: N/A;   CARDIOVERSION  06/16/2012   Procedure: CARDIOVERSION;  Surgeon: Thayer Headings, MD;  Location: [place];  Service: Cardiovascular;  Laterality: N/A;  amanda/ebp/[place]( or scheduling)   CARDIOVERSION  10/29/2012   Procedure: CARDIOVERSION;  Surgeon: Larey Dresser, MD;  Location: [place];  Service: Cardiovascular;  Laterality: N/A;  CARDIOVERSION N/A 03/30/2013   Procedure: CARDIOVERSION;  Surgeon: Larey Dresser, MD;  Location: [place];  Service: Cardiovascular;  Laterality: N/A;   CARDIOVERSION N/A 12/23/2013   Procedure: CARDIOVERSION;  Surgeon: Dorothy Spark, MD;  Location: [place];  Service: Cardiovascular;  Laterality: N/A;  9:27 Propofol 70mg , IV   150 joules synched shock by Dr. Meda Coffee @ 150 joules...SR   post 12 lead ordered.    CARPAL TUNNEL RELEASE  1990's   bilaterally   DILATION AND CURETTAGE OF UTERUS  2000   JOINT REPLACEMENT     bilateral hips   KNEE ARTHROSCOPY Right 11/18/2016   Procedure: RIGHT KNEE ARTHROSCOPY WITH DEBRIDEMENT;  Surgeon: Mcarthur Rossetti, MD;  Location: [place];   Service: Orthopedics;  Laterality: Right;   LAPAROSCOPIC GASTRIC BAND REMOVAL WITH LAPAROSCOPIC GASTRIC SLEEVE RESECTION N/A 05/24/2018   Procedure: LAPAROSCOPIC GASTRIC BAND REMOVAL WITH LAPAROSCOPIC , GASTRIC SLEEVE RESECTION, UPPER ENDO, ERAS Pathway;  Surgeon: Alphonsa Overall, MD;  Location: [facility];  Service: General;  Laterality: N/A;   LAPAROSCOPIC GASTRIC BANDING  2009   LASIK     POSTERIOR FUSION LUMBAR SPINE  2000's   "nerve problems"   POSTERIOR FUSION LUMBAR SPINE  2000's   TEE WITHOUT CARDIOVERSION N/A 12/29/2012   Procedure: TRANSESOPHAGEAL ECHOCARDIOGRAM (TEE);  Surgeon: Peter M Martinique, MD;  Location: [facility] ENDOSCOPY;  Service: Cardiovascular;  Laterality: N/A;   [place]; 2010   right; left   TOTAL KNEE ARTHROPLASTY Right 12/17/2018   Procedure: RIGHT TOTAL KNEE ARTHROPLASTY;  Surgeon: Mcarthur Rossetti, MD;  Location: [facility];  Service: Orthopedics;  Laterality: Right;   TUBAL LIGATION  1980   VAGINAL HYSTERECTOMY  2000   Current Outpatient Medications on File Prior to Visit  Medication Sig Dispense Refill   albuterol (VENTOLIN HFA) 108 (90 Base) MCG/ACT inhaler Inhale 2 puffs into the lungs every 6 (six) hours as needed for wheezing or shortness of breath. 8 g 0   amitriptyline (ELAVIL) 10 MG tablet TAKE 1 TABLET BY MOUTH EVERYDAY AT BEDTIME 30 tablet 0   buPROPion (WELLBUTRIN SR) 200 MG 12 hr tablet Take 1 tablet (200 mg total) by mouth daily. 90 tablet 0   chlorpheniramine-HYDROcodone (TUSSIONEX PENNKINETIC ER) 10-8 MG/5ML Take 5 mLs by mouth every 12 (twelve) hours as needed for cough. 60 mL 0   Cholecalciferol (VITAMIN D) 2000 UNITS tablet Take 2,000 Units by mouth daily.     diltiazem (CARDIZEM CD) 120 MG 24 hr capsule Take 120 mg by mouth daily.      ELIQUIS 5 MG TABS tablet TAKE 1 TABLET BY MOUTH TWICE A DAY....NEED OFFICE VISIT BEFORE ANY MORE REFILLS (Patient taking differently: Take 5 mg by mouth 2 (two)  times daily.) 60 tablet 6   furosemide (LASIX) 40 MG tablet TAKE 2 TABLETS BY MOUTH EVERY DAY IN THE MORNING 180 tablet 1   hyoscyamine (LEVSIN) 0.125 MG tablet TAKE 1 TABLET BY MOUTH EVERY 4 HOURS AS NEEDED 60 tablet 0   levothyroxine (SYNTHROID) 75 MCG tablet TAKE 1 TABLET BY MOUTH EVERY DAY BEFORE BREAKFAST 30 tablet 0   losartan (COZAAR) 50 MG tablet Take 1 tablet (50 mg total) by mouth daily. 30 tablet 0   Magnesium 250 MG TABS Take 250 mg by mouth 2 (two) times daily.     metoprolol tartrate (LOPRESSOR) 25 MG tablet Take 12.5 mg by mouth 2 (two) times daily.   11   molnupiravir EUA (  LAGEVRIO) 200 mg CAPS capsule Take 4 capsules (800 mg total) by mouth 2 (two) times daily for 5 days. 40 capsule 0   MULTAQ 400 MG tablet Take 400 mg by mouth 2 (two) times daily.     vitamin C (ASCORBIC ACID) 500 MG tablet Take 500 mg by mouth daily.     Vitamin D, Ergocalciferol, (DRISDOL) 1.25 MG (50000 UNIT) CAPS capsule Take 1 capsule (50,000 Units total) by mouth every 7 (seven) days. 8 capsule 0   No current facility-administered medications on file prior to visit.   Allergies  Allergen Reactions   Cephalexin Anaphylaxis and Hives    "throat started closing up"   Rocephin [Ceftriaxone Sodium In Dextrose] Anaphylaxis   Penicillins Rash    Has patient had a PCN reaction causing immediate rash, facial/tongue/throat swelling, SOB or lightheadedness with hypotension: no Has patient had a PCN reaction causing severe rash involving mucus membranes or skin necrosis: no Has patient had a PCN reaction that required hospitalization no Has patient had a PCN reaction occurring within the last 10 years: no If all of the above answers are "NO", then may proceed with Cephalosporin use.   Flecainide Acetate Other (See Comments)    Jittery    Moxifloxacin     Does not remember the reaction   Statins     Memory issues   Sulfonamide Derivatives     Does not remember reaction ; "was so young when I had reaction to  it"   Social History   Socioeconomic History   Marital status: Married    Spouse name: Not on file   Number of children: Not on file   Years of education: Not on file   Highest education level: Not on file  Occupational History   Occupation: Retired  Tobacco Use   Smoking status: Never   Smokeless tobacco: Never  Vaping Use   Vaping Use: Never used  Substance and Sexual Activity   Alcohol use: No   Drug use: No   Sexual activity: Yes    Comment: LAVH  Other Topics Concern   Not on file  Social History Narrative   Works in data entry in [employer]   Married, lives in [place] Use - No.    Alcohol Use - no   Social Determinants of Health   Financial Resource Strain: Not on file  Food Insecurity: Not on file  Transportation Needs: Not on file  Physical Activity: Not on file  Stress: Not on file  Social Connections: Not on file  Intimate Partner Violence: Not on file      Review of Systems  All other systems reviewed and are negative.     Objective:   Physical Exam Vitals reviewed.  Constitutional:      General: She is not in acute distress.    Appearance: She is well-developed. She is obese. She is not ill-appearing, toxic-appearing or diaphoretic.  HENT:     Head: Normocephalic and atraumatic.     Nose: Mucosal edema present.  Neck:     Vascular: No carotid bruit.  Cardiovascular:     Rate and Rhythm: Normal rate. Rhythm irregular.     Heart sounds: Normal heart sounds. No murmur heard.   No friction rub. No gallop.  Pulmonary:     Effort: Pulmonary effort is normal. No respiratory distress.     Breath sounds: Normal breath sounds. No wheezing, rhonchi or rales.  Chest:     Chest wall:  No tenderness.  Abdominal:     General: Abdomen is flat. Bowel sounds are normal. There is no distension.     Palpations: Abdomen is soft.     Tenderness: There is no abdominal tenderness. There is no right CVA tenderness, left CVA tenderness, guarding  or rebound.     Hernia: No hernia is present.  Musculoskeletal:     Cervical back: Neck supple.     Right lower leg: No edema.     Left lower leg: No edema.  Neurological:     General: No focal deficit present.     Mental Status: She is alert and oriented to person, place, and time. Mental status is at baseline.     Cranial Nerves: No cranial nerve deficit.  Psychiatric:        Mood and Affect: Mood normal.        Behavior: Behavior normal.        Thought Content: Thought content normal.        Judgment: Judgment normal.          Assessment & Plan:  Essential hypertension - Plan: CBC with Differential/Platelet, COMPLETE METABOLIC PANEL WITH GFR, Lipid panel, Hemoglobin A1c  Prediabetes - Plan: CBC with Differential/Platelet, COMPLETE METABOLIC PANEL WITH GFR, Lipid panel, Hemoglobin A1c  Hypothyroidism, unspecified type - Plan: TSH  Encounter for screening mammogram for malignant neoplasm of breast - Plan: MM 3D SCREEN BREAST BILATERAL I am very happy today with her blood pressure.  Her TSH was normal in September.  She has fasting labs pending with the weight loss management center so I will defer the lab work to their appointment.  However I am concerned about her cholesterol.  I recommended trying Zetia 10 mg a day to try to drop her LDL cholesterol below 100.  Given her prediabetes, and her obesity with a BMI of 41, I think she would be an excellent candidate for a GLP-1 agonist such as Ozempic.  I discussed this with her today but she would like to discuss this with the weight loss management center.  We will call her gastroenterologist to determine when she is next due for colonoscopy I will schedule her for mammogram.  I will defer a bone density test to her gynecologist but I encouraged her to see them this year

## 2021-11-19 ENCOUNTER — Telehealth: Payer: Self-pay

## 2021-11-19 NOTE — Telephone Encounter (Signed)
Pt called in stating that she was seen in office by pcp and forgot to ask for a refill of chlorpheniramine-HYDROcodone (TUSSIONEX PENNKINETIC ER) 10-8 MG/5ML. Please advise.  Cb#: [id number]

## 2021-11-19 NOTE — Telephone Encounter (Signed)
Spoke with pt and she states she has been taking 1.5 TBSP/7.21mL each dose and has run out. She is requesting refill. Pt states Delsym/Robitussin do not work for her.   Please advise, thanks!

## 2021-11-19 NOTE — Telephone Encounter (Signed)
Spoke with pt and advised. Nothing further needed at this time.  ° °

## 2021-11-19 NOTE — Telephone Encounter (Signed)
Will not give more as she has been taking more than prescribed dose; please educate that cough can last for a few weeks after viral infection and it would not be appropriate for her to take this medication the entire time.

## 2021-11-21 ENCOUNTER — Ambulatory Visit (INDEPENDENT_AMBULATORY_CARE_PROVIDER_SITE_OTHER): Payer: Medicare Other | Admitting: Adult Health

## 2021-12-01 ENCOUNTER — Other Ambulatory Visit: Payer: Self-pay | Admitting: Family Medicine

## 2021-12-05 ENCOUNTER — Ambulatory Visit (INDEPENDENT_AMBULATORY_CARE_PROVIDER_SITE_OTHER): Payer: Medicare Other | Admitting: Adult Health

## 2021-12-05 ENCOUNTER — Other Ambulatory Visit: Payer: Self-pay

## 2021-12-05 ENCOUNTER — Encounter (INDEPENDENT_AMBULATORY_CARE_PROVIDER_SITE_OTHER): Payer: Self-pay | Admitting: Adult Health

## 2021-12-05 ENCOUNTER — Other Ambulatory Visit: Payer: Self-pay | Admitting: Nurse Practitioner

## 2021-12-05 VITALS — BP 112/66 | HR 63 | Temp 98.3°F | Ht 65.0 in | Wt 254.0 lb

## 2021-12-05 DIAGNOSIS — R7303 Prediabetes: Secondary | ICD-10-CM | POA: Diagnosis not present

## 2021-12-05 DIAGNOSIS — E669 Obesity, unspecified: Secondary | ICD-10-CM

## 2021-12-05 DIAGNOSIS — E66813 Obesity, class 3: Secondary | ICD-10-CM

## 2021-12-05 DIAGNOSIS — Z6841 Body Mass Index (BMI) 40.0 and over, adult: Secondary | ICD-10-CM

## 2021-12-05 DIAGNOSIS — R062 Wheezing: Secondary | ICD-10-CM

## 2021-12-05 DIAGNOSIS — E782 Mixed hyperlipidemia: Secondary | ICD-10-CM | POA: Diagnosis not present

## 2021-12-05 DIAGNOSIS — E559 Vitamin D deficiency, unspecified: Secondary | ICD-10-CM

## 2021-12-05 DIAGNOSIS — I10 Essential (primary) hypertension: Secondary | ICD-10-CM

## 2021-12-05 MED ORDER — LOSARTAN POTASSIUM 50 MG PO TABS: 50.0000 mg | ORAL_TABLET | Freq: Every day | ORAL | 0 refills | Status: DC

## 2021-12-05 MED ORDER — VITAMIN D (ERGOCALCIFEROL) 1.25 MG (50000 UNIT) PO CAPS: 50000.0000 [IU] | ORAL_CAPSULE | ORAL | 0 refills | Status: DC

## 2021-12-05 NOTE — Progress Notes (Signed)
Chief Complaint:   OBESITY Heather [name] is here to discuss her progress with her obesity treatment plan along with follow-up of her obesity related diagnoses. Heather [name] is on following a lower carbohydrate, vegetable and lean protein rich diet plan and states she is following her eating plan approximately 90% of the time. Heather [name] states she is doing chair exercises for 15 minutes 2 times per week.  Today's visit was #: 9 Starting weight: 275lbs Starting date: 06/19/2021 Today's weight: 254lbs Today's date: 12/05/2021 Total lbs lost to date: 21 Total lbs lost since last in-office visit: 5lbs  Interim History:  Food recall: Breakfast:cheese-deli meat Lunch:pickles, cheese-deli meat Dinner:meat-vegetables  She enjoys low CHO eating plans and denies worsening constipation.  Subjective:   1. Essential hypertension BP/HR excellent at office visit today. She denies acute cardiac sx's. She denies tobacco/vape use.  2. Prediabetes Heather [name] is not on any BG lowering medications. + Family history of T2D.  3. Vitamin D deficiency She is currently taking prescription ergocalciferol 50,000 IU each week. She denies nausea, vomiting or muscle weakness.   4. Mixed hyperlipidemia Patient unsure of HLD family history. Previously on statin- unable to tolerate due to medication r/t memory loss.  Assessment/Plan:   1. Essential hypertension Refill Losartan 50mg  daily. Check labs.  - Refill losartan (COZAAR) 50 MG tablet; Take 1 tablet (50 mg total) by mouth daily.  Dispense: 30 tablet; Refill: 0 - Comprehensive metabolic panel  2. Prediabetes Check labs today.  - Hemoglobin A1c - Insulin, random  3. Vitamin D deficiency Refill Ergocalciferol 50,000 IU once a week. Check vitamin D levels today.  - Refill Vitamin D, Ergocalciferol, (DRISDOL) 1.25 MG (50000 UNIT) CAPS capsule; Take 1 capsule (50,000 Units total) by mouth every 7 (seven) days.  Dispense: 8 capsule; Refill: 0 -  VITAMIN D 25 Hydroxy (Vit-D Deficiency, Fractures)  4. Mixed hyperlipidemia Check lipid panel today.  - Lipid panel  5. Obesity, current BMI 42.3  Heather [name] is currently in the action stage of change. As such, her goal is to continue with weight loss efforts. She has agreed to following a lower carbohydrate, vegetable and lean protein rich diet plan.   Exercise goals:  As is.  Behavioral modification strategies: increasing lean protein intake, decreasing simple carbohydrates, meal planning and cooking strategies, keeping healthy foods in the home, and planning for success.  Heather [name] has agreed to follow-up with our clinic in 3 weeks. She was informed of the importance of frequent follow-up visits to maximize her success with intensive lifestyle modifications for her multiple health conditions.   Heather [name] was informed we would discuss her lab results at her next visit unless there is a critical issue that needs to be addressed sooner. Heather [name] agreed to keep her next visit at the agreed upon time to discuss these results.  Objective:   Blood pressure 112/66, pulse 63, temperature 98.3 F (36.8 C), height 5\' 5"  (1.651 m), weight 254 lb (115.2 kg), SpO2 95 %. Body mass index is 42.27 kg/m.  General: Cooperative, alert, well developed, in no acute distress. HEENT: Conjunctivae and lids unremarkable. Cardiovascular: Regular rhythm.  Lungs: Normal work of breathing. Neurologic: No focal deficits.   Lab Results  Component Value Date   CREATININE 0.94 06/19/2021   BUN 25 06/19/2021   NA 144 06/19/2021   K 4.6 06/19/2021   CL 102 06/19/2021   CO2 24 06/19/2021   Lab Results  Component Value Date   ALT 14 06/19/2021   AST 21 06/19/2021  ALKPHOS 126 (H) 06/19/2021   BILITOT 0.3 06/19/2021   Lab Results  Component Value Date   HGBA1C 6.0 (H) 06/19/2021   HGBA1C 6.0 03/29/2018   HGBA1C 6.1 (H) 03/05/2015   Lab Results  Component Value Date   INSULIN 4.9 06/19/2021   Lab  Results  Component Value Date   TSH 2.170 06/19/2021   Lab Results  Component Value Date   CHOL 266 (H) 06/19/2021   HDL 81 06/19/2021   LDLCALC 167 (H) 06/19/2021   TRIG 105 06/19/2021   CHOLHDL 3.2 04/30/2020   Lab Results  Component Value Date   VD25OH 37.3 06/19/2021   Lab Results  Component Value Date   WBC 5.5 04/30/2020   HGB 13.1 04/30/2020   HCT 40.0 04/30/2020   MCV 89.7 04/30/2020   PLT 221 04/30/2020    Obesity Behavioral Intervention:   Approximately 15 minutes were spent on the discussion below.  ASK: We discussed the diagnosis of obesity with Heather [name] today and Heather [name] agreed to give [place] permission to discuss obesity behavioral modification therapy today.  ASSESS: Heather [name] has the diagnosis of obesity and her BMI today is 42.3. Heather [name] is in the action stage of change.   ADVISE: Heather [name] was educated on the multiple health risks of obesity as well as the benefit of weight loss to improve her health. She was advised of the need for long term treatment and the importance of lifestyle modifications to improve her current health and to decrease her risk of future health problems.  AGREE: Multiple dietary modification options and treatment options were discussed and Heather [name] agreed to follow the recommendations documented in the above note.  ARRANGE: Heather [name] was educated on the importance of frequent visits to treat obesity as outlined per CMS and USPSTF guidelines and agreed to schedule her next follow up appointment today.  Attestation Statements:   Reviewed by clinician on day of visit: allergies, medications, problem list, medical history, surgical history, family history, social history, and previous encounter notes.  I, Brendell Tyus, RMA, am acting as transcriptionist for William Hamburger, NP.  I have reviewed the above documentation for accuracy and completeness, and I agree with the above. -  Katlyne Nishida d. Reinaldo Helt, NP-C

## 2021-12-06 LAB — COMPREHENSIVE METABOLIC PANEL
ALT: 13 IU/L (ref 0–32)
AST: 19 IU/L (ref 0–40)
Albumin/Globulin Ratio: 1.7 (ref 1.2–2.2)
Albumin: 4.5 g/dL (ref 3.8–4.8)
Alkaline Phosphatase: 113 IU/L (ref 44–121)
BUN/Creatinine Ratio: 32 — ABNORMAL HIGH (ref 12–28)
BUN: 32 mg/dL — ABNORMAL HIGH (ref 8–27)
Bilirubin Total: 0.5 mg/dL (ref 0.0–1.2)
CO2: 28 mmol/L (ref 20–29)
Calcium: 9.7 mg/dL (ref 8.7–10.3)
Chloride: 97 mmol/L (ref 96–106)
Creatinine, Ser: 1 mg/dL (ref 0.57–1.00)
Globulin, Total: 2.6 g/dL (ref 1.5–4.5)
Glucose: 97 mg/dL (ref 70–99)
Potassium: 4.1 mmol/L (ref 3.5–5.2)
Sodium: 142 mmol/L (ref 134–144)
Total Protein: 7.1 g/dL (ref 6.0–8.5)
eGFR: 61 mL/min/{1.73_m2} (ref >59)

## 2021-12-06 LAB — LIPID PANEL
Chol/HDL Ratio: 3.5 ratio (ref 0.0–4.4)
Cholesterol, Total: 279 mg/dL — ABNORMAL HIGH (ref 100–199)
HDL: 79 mg/dL (ref >39)
LDL Chol Calc (NIH): 182 mg/dL — ABNORMAL HIGH (ref 0–99)
Triglycerides: 108 mg/dL (ref 0–149)
VLDL Cholesterol Cal: 18 mg/dL (ref 5–40)

## 2021-12-06 LAB — VITAMIN D 25 HYDROXY (VIT D DEFICIENCY, FRACTURES): Vit D, 25-Hydroxy: 42.7 ng/mL (ref 30.0–100.0)

## 2021-12-06 LAB — HEMOGLOBIN A1C
Est. average glucose Bld gHb Est-mCnc: 120 mg/dL
Hgb A1c MFr Bld: 5.8 % — ABNORMAL HIGH (ref 4.8–5.6)

## 2021-12-06 LAB — INSULIN, RANDOM: INSULIN: 5.4 u[IU]/mL (ref 2.6–24.9)

## 2021-12-26 ENCOUNTER — Other Ambulatory Visit: Payer: Self-pay

## 2021-12-26 ENCOUNTER — Ambulatory Visit (INDEPENDENT_AMBULATORY_CARE_PROVIDER_SITE_OTHER): Payer: Medicare Other

## 2021-12-26 VITALS — Ht 65.0 in | Wt 248.0 lb

## 2021-12-26 DIAGNOSIS — Z0000 Encounter for general adult medical examination without abnormal findings: Secondary | ICD-10-CM | POA: Diagnosis not present

## 2021-12-26 NOTE — Patient Instructions (Signed)
Heather [name] , ?Thank you for taking time to come for your Medicare Wellness Visit. I appreciate your ongoing commitment to your health goals. Please review the following plan we discussed and let me know if I can assist you in the future.  ? ?Screening recommendations/referrals: ?Colonoscopy: Done Repeat in 10 years ? ?Mammogram: Scheduled for 01/03/2022. ?Bone Density: Check with GYN to schedule. ? ?Recommended yearly ophthalmology/optometry visit for glaucoma screening and checkup ?Recommended yearly dental visit for hygiene and checkup ? ?Vaccinations: ?Influenza vaccine: Due Repeat annually ? ?Pneumococcal vaccine: Done 08/11/2018 and 07/06/2019 ?Tdap vaccine: Due Repeat in 10 years ? ?Shingles vaccine: Discussed.   ?Covid-19:Declined. ? ?Advanced directives: Advance directive discussed with you today. Even though you declined this today, please call our office should you change your mind, and we can give you the proper paperwork for you to fill out. ? ? ?Conditions/risks identified: KEEP UP THE GOOD WORK!! ? ?Next appointment: Follow up in one year for your annual wellness visit 2024. ? ? ?Preventive Care 23 Years and Older, Female ?Preventive care refers to lifestyle choices and visits with your health care provider that can promote health and wellness. ?What does preventive care include? ?A yearly physical exam. This is also called an annual well check. ?Dental exams once or twice a year. ?Routine eye exams. Ask your health care provider how often you should have your eyes checked. ?Personal lifestyle choices, including: ?Daily care of your teeth and gums. ?Regular physical activity. ?Eating a healthy diet. ?Avoiding tobacco and drug use. ?Limiting alcohol use. ?Practicing safe sex. ?Taking low-dose aspirin every day. ?Taking vitamin and mineral supplements as recommended by your health care provider. ?What happens during an annual well check? ?The services and screenings done by your health care provider during  your annual well check will depend on your age, overall health, lifestyle risk factors, and family history of disease. ?Counseling  ?Your health care provider may ask you questions about your: ?Alcohol use. ?Tobacco use. ?Drug use. ?Emotional well-being. ?Home and relationship well-being. ?Sexual activity. ?Eating habits. ?History of falls. ?Memory and ability to understand (cognition). ?Work and work Astronomer. ?Reproductive health. ?Screening  ?You may have the following tests or measurements: ?Height, weight, and BMI. ?Blood pressure. ?Lipid and cholesterol levels. These may be checked every 5 years, or more frequently if you are over 33 years old. ?Skin check. ?Lung cancer screening. You may have this screening every year starting at age 23 if you have a 30-pack-year history of smoking and currently smoke or have quit within the past 15 years. ?Fecal occult blood test (FOBT) of the stool. You may have this test every year starting at age 37. ?Flexible sigmoidoscopy or colonoscopy. You may have a sigmoidoscopy every 5 years or a colonoscopy every 10 years starting at age 17. ?Hepatitis C blood test. ?Hepatitis B blood test. ?Sexually transmitted disease (STD) testing. ?Diabetes screening. This is done by checking your blood sugar (glucose) after you have not eaten for a while (fasting). You may have this done every 1-3 years. ?Bone density scan. This is done to screen for osteoporosis. You may have this done starting at age 53. ?Mammogram. This may be done every 1-2 years. Talk to your health care provider about how often you should have regular mammograms. ?Talk with your health care provider about your test results, treatment options, and if necessary, the need for more tests. ?Vaccines  ?Your health care provider may recommend certain vaccines, such as: ?Influenza vaccine. This is recommended every year. ?  Tetanus, diphtheria, and acellular pertussis (Tdap, Td) vaccine. You may need a Td booster every 10  years. ?Zoster vaccine. You may need this after age 31. ?Pneumococcal 13-valent conjugate (PCV13) vaccine. One dose is recommended after age 43. ?Pneumococcal polysaccharide (PPSV23) vaccine. One dose is recommended after age 70. ?Talk to your health care provider about which screenings and vaccines you need and how often you need them. ?This information is not intended to replace advice given to you by your health care provider. Make sure you discuss any questions you have with your health care provider. ?Document Released: 10/26/2015 Document Revised: 06/18/2016 Document Reviewed: 07/31/2015 ?Elsevier Interactive Patient Education ? 2017 [place]. ? ?Fall Prevention in the Home ?Falls can cause injuries. They can happen to people of all ages. There are many things you can do to make your home safe and to help prevent falls. ?What can I do on the outside of my home? ?Regularly fix the edges of walkways and driveways and fix any cracks. ?Remove anything that might make you trip as you walk through a door, such as a raised step or threshold. ?Trim any bushes or trees on the path to your home. ?Use bright outdoor lighting. ?Clear any walking paths of anything that might make someone trip, such as rocks or tools. ?Regularly check to see if handrails are loose or broken. Make sure that both sides of any steps have handrails. ?Any raised decks and porches should have guardrails on the edges. ?Have any leaves, snow, or ice cleared regularly. ?Use sand or salt on walking paths during winter. ?Clean up any spills in your garage right away. This includes oil or grease spills. ?What can I do in the bathroom? ?Use night lights. ?Install grab bars by the toilet and in the tub and shower. Do not use towel bars as grab bars. ?Use non-skid mats or decals in the tub or shower. ?If you need to sit down in the shower, use a plastic, non-slip stool. ?Keep the floor dry. Clean up any water that spills on the floor as soon as it  happens. ?Remove soap buildup in the tub or shower regularly. ?Attach bath mats securely with double-sided non-slip rug tape. ?Do not have throw rugs and other things on the floor that can make you trip. ?What can I do in the bedroom? ?Use night lights. ?Make sure that you have a light by your bed that is easy to reach. ?Do not use any sheets or blankets that are too big for your bed. They should not hang down onto the floor. ?Have a firm chair that has side arms. You can use this for support while you get dressed. ?Do not have throw rugs and other things on the floor that can make you trip. ?What can I do in the kitchen? ?Clean up any spills right away. ?Avoid walking on wet floors. ?Keep items that you use a lot in easy-to-reach places. ?If you need to reach something above you, use a strong step stool that has a grab bar. ?Keep electrical cords out of the way. ?Do not use floor polish or wax that makes floors slippery. If you must use wax, use non-skid floor wax. ?Do not have throw rugs and other things on the floor that can make you trip. ?What can I do with my stairs? ?Do not leave any items on the stairs. ?Make sure that there are handrails on both sides of the stairs and use them. Fix handrails that are broken or loose.  Make sure that handrails are as long as the stairways. ?Check any carpeting to make sure that it is firmly attached to the stairs. Fix any carpet that is loose or worn. ?Avoid having throw rugs at the top or bottom of the stairs. If you do have throw rugs, attach them to the floor with carpet tape. ?Make sure that you have a light switch at the top of the stairs and the bottom of the stairs. If you do not have them, ask someone to add them for you. ?What else can I do to help prevent falls? ?Wear shoes that: ?Do not have high heels. ?Have rubber bottoms. ?Are comfortable and fit you well. ?Are closed at the toe. Do not wear sandals. ?If you use a stepladder: ?Make sure that it is fully opened.  Do not climb a closed stepladder. ?Make sure that both sides of the stepladder are locked into place. ?Ask someone to hold it for you, if possible. ?Clearly mark and make sure that you can see: ?Any grab b

## 2021-12-26 NOTE — Progress Notes (Signed)
? ?Subjective:  ? Heather BernhardtCynthia Messick Galentine is a 71 y.o. female who presents for an Initial Medicare Annual Wellness Visit. ?Virtual Visit via Telephone Note ? ?I connected with  Heather [name] on 12/26/21 at  3:30 PM EDT by telephone and verified that I am speaking with the correct person using two identifiers. ? ?Location: ?Patient: HOME ?Provider: [facility] ?Persons participating in the virtual visit: patient/Nurse Health Advisor ?  ?I discussed the limitations, risks, security and privacy concerns of performing an evaluation and management service by telephone and the availability of in person appointments. The patient expressed understanding and agreed to proceed. ? ?Interactive audio and video telecommunications were attempted between this nurse and patient, however failed, due to patient having technical difficulties OR patient did not have access to video capability.  We continued and completed visit with audio only. ? ?Some vital signs may be absent or patient reported.  ? ?Darral DashMary Jane K Kel Senn, LPN ? ?Review of Systems    ? ?Cardiac Risk Factors include: advanced age (>4355men, 9>65 women);hypertension;dyslipidemia;sedentary lifestyle;obesity (BMI >30kg/m2);Other (see comment), Risk factor comments: Knee pain, DDD ? ?   ?Objective:  ?  ?Today's Vitals  ? 12/26/21 1539  ?Weight: 248 lb (112.5 kg)  ?Height: 5\' 5"  (1.651 m)  ? ?Body mass index is 41.27 kg/m?. ? ?Advanced Directives 12/26/2021 04/30/2020 12/17/2018 12/13/2018 05/24/2018 05/18/2018 09/07/2017  ?Does Patient Have a Medical Advance Directive? No No Yes No No No No  ?Type of Advance Directive - Web designer- Healthcare Power of Attorney;Living will - - - -  ?Does patient want to make changes to medical advance directive? - - No - Patient declined - - - -  ?Copy of Healthcare Power of Attorney in Chart? - - No - copy requested - - - -  ?Would patient like information on creating a medical advance directive? No - Patient declined - No - Patient declined No - Patient  declined No - Patient declined No - Patient declined No - Patient declined  ?Pre-existing out of facility DNR order (yellow form or pink MOST form) - - - - - - -  ? ? ?Current Medications (verified) ?Outpatient Encounter Medications as of 12/26/2021  ?Medication Sig  ? albuterol (VENTOLIN HFA) 108 (90 Base) MCG/ACT inhaler TAKE 2 PUFFS BY MOUTH EVERY 6 HOURS AS NEEDED FOR WHEEZE OR SHORTNESS OF BREATH  ? amitriptyline (ELAVIL) 10 MG tablet TAKE 1 TABLET BY MOUTH EVERYDAY AT BEDTIME  ? buPROPion (WELLBUTRIN SR) 200 MG 12 hr tablet Take 1 tablet (200 mg total) by mouth daily.  ? Cholecalciferol (VITAMIN D) 2000 UNITS tablet Take 2,000 Units by mouth daily.  ? diltiazem (CARDIZEM CD) 120 MG 24 hr capsule Take 120 mg by mouth daily.   ? ELIQUIS 5 MG TABS tablet TAKE 1 TABLET BY MOUTH TWICE A DAY....NEED OFFICE VISIT BEFORE ANY MORE REFILLS (Patient taking differently: Take 5 mg by mouth 2 (two) times daily.)  ? ezetimibe (ZETIA) 10 MG tablet Take 1 tablet (10 mg total) by mouth daily.  ? furosemide (LASIX) 40 MG tablet TAKE 2 TABLETS BY MOUTH EVERY DAY IN THE MORNING (Patient taking differently: 40 mg in the morning.)  ? hyoscyamine (LEVSIN) 0.125 MG tablet TAKE 1 TABLET BY MOUTH EVERY 4 HOURS AS NEEDED  ? levothyroxine (SYNTHROID) 75 MCG tablet TAKE 1 TABLET BY MOUTH EVERY DAY BEFORE BREAKFAST  ? losartan (COZAAR) 50 MG tablet Take 1 tablet (50 mg total) by mouth daily.  ? Magnesium 250 MG TABS Take 250 mg  by mouth 2 (two) times daily.  ? metoprolol tartrate (LOPRESSOR) 25 MG tablet Take 12.5 mg by mouth 2 (two) times daily.   ? MULTAQ 400 MG tablet Take 400 mg by mouth 2 (two) times daily.  ? vitamin C (ASCORBIC ACID) 500 MG tablet Take 500 mg by mouth daily.  ? Vitamin D, Ergocalciferol, (DRISDOL) 1.25 MG (50000 UNIT) CAPS capsule Take 1 capsule (50,000 Units total) by mouth every 7 (seven) days.  ? chlorpheniramine-HYDROcodone (TUSSIONEX PENNKINETIC ER) 10-8 MG/5ML Take 5 mLs by mouth every 12 (twelve) hours as  needed for cough. (Patient not taking: Reported on 12/26/2021)  ? ?No facility-administered encounter medications on file as of 12/26/2021.  ? ? ?Allergies (verified) ?Cephalexin, Rocephin [ceftriaxone sodium in dextrose], Penicillins, Flecainide acetate, Moxifloxacin, Statins, and Sulfonamide derivatives  ? ?History: ?Past Medical History:  ?Diagnosis Date  ? Anemia   ? "onc;e; had to take iron for awhile"  ? Atrial fibrillation (HCC)   ? Began in 2004.  Had ablations at [facility] in 2005 and 2007.  She is off coumadin  now with no noted recurrent atrial fibrillation since 2007. til 04/01/12; s/p initiation of Rhythmol with DCCV June 2013; reports 05-18-18 that she is in aflutter   ? Back pain   ? Carpal tunnel syndrome   ? Degenerative disk disease   ? Edema, lower extremity   ? GERD (gastroesophageal reflux disease)   ? resolved after lap band  ? History of blood transfusion   ? "w/both hip replacements"  ? HTN (hypertension)   ? Hypothyroidism   ? IBS (irritable bowel syndrome)   ? Interstitial cystitis   ? Joint pain   ? Lactose intolerance   ? Morton's neuroma   ? right foot  ? Obesity   ? On home oxygen therapy   ? 2L continuous daytime; at night uses 3L with CPAP at nighttime   ? Palpitations   ? Pneumonitis dx 1 year ago  ? cryptogenic organizing pneumonitis; treated with prednisone   ? Posterior tibial tendinitis   ? Pulmonary hypertension (HCC)   ? Shortness of breath   ? only without oxygen  ? Sleep apnea 04/01/2012  ? "dx'd just last week"  ? Sleep apnea   ? uses CPAP, 11 CWP with residual AHI 6.3  ? Vitamin D deficiency   ? ?Past Surgical History:  ?Procedure Laterality Date  ? ANTERIOR AND POSTERIOR REPAIR  10/20/2012  ? Procedure: ANTERIOR (CYSTOCELE) AND POSTERIOR REPAIR (RECTOCELE);  Surgeon: Esmeralda Arthur, MD;  Location: [facility] ORS;  Service: Gynecology;  Laterality: N/A;  2 hours  ? ATRIAL FIBRILLATION ABLATION N/A 12/30/2012  ? Procedure: ATRIAL FIBRILLATION ABLATION;  Surgeon: Hillis Range, MD;   Location: [facility] CATH LAB;  Service: Cardiovascular;  Laterality: N/A;  ? BACK SURGERY    ? CARDIAC CATHETERIZATION    ? CARDIAC ELECTROPHYSIOLOGY MAPPING AND ABLATION  ~ 2005 and 2007  ? "did more 2nd time; both at [facility]"  ? CARDIOVERSION  04/02/2012  ? Procedure: CARDIOVERSION;  Surgeon: Laurey Morale, MD;  Location: [facility] OR;  Service: Cardiovascular;  Laterality: N/A;  ? CARDIOVERSION  06/16/2012  ? Procedure: CARDIOVERSION;  Surgeon: [name], MD;  Location: [facility] ENDOSCOPY;  Service: Cardiovascular;  Laterality: N/A;  amanda/ebp/Beverly( or scheduling)  ? CARDIOVERSION  10/29/2012  ? Procedure: CARDIOVERSION;  Surgeon: Laurey Morale, MD;  Location: [facility] ENDOSCOPY;  Service: Cardiovascular;  Laterality: N/A;  ? CARDIOVERSION N/A 03/30/2013  ? Procedure: CARDIOVERSION;  Surgeon: Freida Busman  Alford Highland, MD;  Location: [facility] ENDOSCOPY;  Service: Cardiovascular;  Laterality: N/A;  ? CARDIOVERSION N/A 12/23/2013  ? Procedure: CARDIOVERSION;  Surgeon: Lars Masson, MD;  Location: [facility] ENDOSCOPY;  Service: Cardiovascular;  Laterality: N/A;  9:27 Propofol 70mg , IV   150 joules synched shock by Dr. @ 150 joules...SR   post 12 lead ordered.   ? CARPAL TUNNEL RELEASE  1990's  ? bilaterally  ? DILATION AND CURETTAGE OF UTERUS  2000  ? JOINT REPLACEMENT    ? bilateral hips  ? KNEE ARTHROSCOPY Right 11/18/2016  ? Procedure: RIGHT KNEE ARTHROSCOPY WITH DEBRIDEMENT;  Surgeon: 01/16/2017, MD;  Location: [facility] OR;  Service: Orthopedics;  Laterality: Right;  ? LAPAROSCOPIC GASTRIC BAND REMOVAL WITH LAPAROSCOPIC GASTRIC SLEEVE RESECTION N/A 05/24/2018  ? Procedure: LAPAROSCOPIC GASTRIC BAND REMOVAL WITH LAPAROSCOPIC , GASTRIC SLEEVE RESECTION, UPPER ENDO, ERAS Pathway;  Surgeon: 07/24/2018, MD;  Location: [facility] ORS;  Service: General;  Laterality: N/A;  ? LAPAROSCOPIC GASTRIC BANDING  2009  ? LASIK    ? POSTERIOR FUSION LUMBAR SPINE  2000's  ? "nerve problems"  ? POSTERIOR FUSION LUMBAR SPINE  2000's  ? TEE WITHOUT  CARDIOVERSION N/A 12/29/2012  ? Procedure: TRANSESOPHAGEAL ECHOCARDIOGRAM (TEE);  Surgeon: Peter M 12/31/2012, MD;  Location: [facility] ENDOSCOPY;  Service: Cardiovascular;  Laterality: N/A;  ? TONSILLECTOMY AND ADENOIDECTOMY  1957  ?

## 2021-12-30 ENCOUNTER — Encounter (INDEPENDENT_AMBULATORY_CARE_PROVIDER_SITE_OTHER): Payer: Self-pay | Admitting: Adult Health

## 2021-12-30 ENCOUNTER — Other Ambulatory Visit: Payer: Self-pay

## 2021-12-30 ENCOUNTER — Ambulatory Visit (INDEPENDENT_AMBULATORY_CARE_PROVIDER_SITE_OTHER): Payer: Medicare Other | Admitting: Adult Health

## 2021-12-30 VITALS — BP 106/68 | HR 63 | Temp 98.2°F | Ht 65.0 in | Wt 253.0 lb

## 2021-12-30 DIAGNOSIS — E782 Mixed hyperlipidemia: Secondary | ICD-10-CM | POA: Diagnosis not present

## 2021-12-30 DIAGNOSIS — R7303 Prediabetes: Secondary | ICD-10-CM

## 2021-12-30 DIAGNOSIS — I10 Essential (primary) hypertension: Secondary | ICD-10-CM | POA: Diagnosis not present

## 2021-12-30 DIAGNOSIS — F5089 Other specified eating disorder: Secondary | ICD-10-CM

## 2021-12-30 DIAGNOSIS — E559 Vitamin D deficiency, unspecified: Secondary | ICD-10-CM | POA: Diagnosis not present

## 2021-12-30 DIAGNOSIS — Z6841 Body Mass Index (BMI) 40.0 and over, adult: Secondary | ICD-10-CM

## 2021-12-30 DIAGNOSIS — E669 Obesity, unspecified: Secondary | ICD-10-CM | POA: Diagnosis not present

## 2021-12-30 MED ORDER — BUPROPION HCL ER (SR) 150 MG PO TB12: 150.0000 mg | ORAL_TABLET | Freq: Every day | ORAL | 0 refills | Status: AC

## 2021-12-31 DIAGNOSIS — F509 Eating disorder, unspecified: Secondary | ICD-10-CM | POA: Insufficient documentation

## 2021-12-31 NOTE — Progress Notes (Signed)
? ? ? ?Chief Complaint:  ? ?OBESITY ?Mi is here to discuss her progress with her obesity treatment plan along with follow-up of her obesity related diagnoses. Ardyce is on following a lower carbohydrate, vegetable and lean protein rich diet plan and states she is following her eating plan approximately 100% of the time. Coreen states she is doing chair exercises for 30 minutes 2 times per week. ? ?Today's visit was #: 10 ?Starting weight: 275 lbs ?Starting date: 06/19/2021 ?Today's weight: 253 lbs ?Today's date: 12/30/2021 ?Total lbs lost to date: 22 lbs ?Total lbs lost since last in-office visit: 1 lb ? ?Interim History:  ?Jackalynn's recently experienced symptoms of hypotension and home pressure readings- SBP 105-116 DBP 60s ?She decreased Cozaar from 50 mg daily to 1/2 tablet (25 mg) daily. ?She reports resolution of dizziness with reduction in ARB. ? ?Subjective:  ? ?1. Prediabetes ?Discussed labs with patient today.  ?She denies family history of MTC or personal history of pancreatitis. ?She has never been on BG lowering medications. ?On 12/05/2021, A1c was 5.8, insulin level - 5.4. ? ?2. Essential hypertension ?Discussed labs with patient today.  ?She was experiencing hypotension symptoms at home - she reduced losartan from 50 mg to 1/2 tablet (25 mg) daily. ?All other antihypertensives continued as directed. ?On 12/05/2021, CMP - electrolytes and kidney function stable. ? ?3. Mixed hyperlipidemia ?Worsening.  Discussed labs with patient today.  ?On 12/05/2021, lipid panel - total/LDL both worsening. ?She is unsure of family history of hyperlipidemia. ?Statin intolerant (loss of memory).   ?Recently started Zetia 10 mg daily. ?Recommend that she be seen in Lipid Clinic - she wants to discuss with PCP. ? ?4. Vitamin D deficiency ?Discussed labs with patient today.  ?On 12/05/2021, vitamin D level - 42.7 - below goal of 50-70. ?She is currently taking prescription ergocalciferol 50,000 IU each week. She denies  nausea, vomiting or muscle weakness. ? ?5. Other disorder of eating ?BP/HR excellent. ?She is on bupropion SR 200 mg daily - she wants to wean to discontinue. ? ?Assessment/Plan:  ? ?1. Prediabetes ?Increase protein intake.  Continue regular exercise. ? ?2. Essential hypertension ?Continue Cardizem 120mg  QD,Lopressor 25mg  1/2 tab BID, Lasix 40mg  QD, Cozaar 50mg  1/2 tab QD ? ?3. Mixed hyperlipidemia ?Follow-up with PCP. ? ?4. Vitamin D deficiency ?Continue ergocalciferol.  No need for refill today. ? ?5. Other disorder of eating ?Refill and decrease bupropion SR to 150 mg daily. ? ?- Refill and decrease buPROPion (WELLBUTRIN SR) 150 MG 12 hr tablet; Take 1 tablet (150 mg total) by mouth daily.  Dispense: 30 tablet; Refill: 0 ? ?6. Obesity, current BMI 42.1 ? ?Tammara is currently in the action stage of change. As such, her goal is to continue with weight loss efforts. She has agreed to following a lower carbohydrate, vegetable and lean protein rich diet plan.  ? ?Exercise goals:  As is. ? ?Behavioral modification strategies: increasing lean protein intake, decreasing simple carbohydrates, meal planning and cooking strategies, keeping healthy foods in the home, and planning for success. ? ?Calisha has agreed to follow-up with our clinic in 4 weeks. She was informed of the importance of frequent follow-up visits to maximize her success with intensive lifestyle modifications for her multiple health conditions.  ? ?Objective:  ? ?Blood pressure 106/68, pulse 63, temperature 98.2 ?F (36.8 ?C), height 5\' 5"  (1.651 m), weight 253 lb (114.8 kg), SpO2 99 %. ?Body mass index is 42.1 kg/m?. ? ?General: Cooperative, alert, well developed, in no acute distress. ?  HEENT: Conjunctivae and lids unremarkable. ?Cardiovascular: Regular rhythm.  ?Lungs: Normal work of breathing. ?Neurologic: No focal deficits.  ? ?Lab Results  ?Component Value Date  ? CREATININE 1.00 12/05/2021  ? BUN 32 (H) 12/05/2021  ? NA 142 12/05/2021  ? K 4.1  12/05/2021  ? CL 97 12/05/2021  ? CO2 28 12/05/2021  ? ?Lab Results  ?Component Value Date  ? ALT 13 12/05/2021  ? AST 19 12/05/2021  ? ALKPHOS 113 12/05/2021  ? BILITOT 0.5 12/05/2021  ? ?Lab Results  ?Component Value Date  ? HGBA1C 5.8 (H) 12/05/2021  ? HGBA1C 6.0 (H) 06/19/2021  ? HGBA1C 6.0 03/29/2018  ? HGBA1C 6.1 (H) 03/05/2015  ? ?Lab Results  ?Component Value Date  ? INSULIN 5.4 12/05/2021  ? INSULIN 4.9 06/19/2021  ? ?Lab Results  ?Component Value Date  ? TSH 2.170 06/19/2021  ? ?Lab Results  ?Component Value Date  ? CHOL 279 (H) 12/05/2021  ? HDL 79 12/05/2021  ? LDLCALC 182 (H) 12/05/2021  ? TRIG 108 12/05/2021  ? CHOLHDL 3.5 12/05/2021  ? ?Lab Results  ?Component Value Date  ? VD25OH 42.7 12/05/2021  ? VD25OH 37.3 06/19/2021  ? ?Lab Results  ?Component Value Date  ? WBC 5.5 04/30/2020  ? HGB 13.1 04/30/2020  ? HCT 40.0 04/30/2020  ? MCV 89.7 04/30/2020  ? PLT 221 04/30/2020  ? ?Obesity Behavioral Intervention:  ? ?Approximately 15 minutes were spent on the discussion below. ? ?ASK: ?We discussed the diagnosis of obesity with Suhana today and Loriel agreed to give [place] permission to discuss obesity behavioral modification therapy today. ? ?ASSESS: ?Libni has the diagnosis of obesity and her BMI today is 42.1. Evanie is in the action stage of change.  ? ?ADVISE: ?Aleeza was educated on the multiple health risks of obesity as well as the benefit of weight loss to improve her health. She was advised of the need for long term treatment and the importance of lifestyle modifications to improve her current health and to decrease her risk of future health problems. ? ?AGREE: ?Multiple dietary modification options and treatment options were discussed and Eulalah agreed to follow the recommendations documented in the above note. ? ?ARRANGE: ?Nashalie was educated on the importance of frequent visits to treat obesity as outlined per CMS and USPSTF guidelines and agreed to schedule her next follow up appointment  today. ? ?Attestation Statements:  ? ?Reviewed by clinician on day of visit: allergies, medications, problem list, medical history, surgical history, family history, social history, and previous encounter notes. ? ?I, Insurance claims handler, CMA, am acting as Energy manager for William Hamburger, NP. ? ?I have reviewed the above documentation for accuracy and completeness, and I agree with the above. - Maryn Freelove d. Florette Thai, NP-C ?

## 2022-01-03 ENCOUNTER — Other Ambulatory Visit (INDEPENDENT_AMBULATORY_CARE_PROVIDER_SITE_OTHER): Payer: Self-pay | Admitting: Adult Health

## 2022-01-03 ENCOUNTER — Ambulatory Visit
Admission: RE | Admit: 2022-01-03 | Discharge: 2022-01-03 | Disposition: A | Discharge status: Home / Self Care | Payer: Medicare Other | Source: Ambulatory Visit | Attending: Family Medicine | Admitting: Family Medicine

## 2022-01-03 ENCOUNTER — Other Ambulatory Visit: Payer: Self-pay

## 2022-01-03 DIAGNOSIS — Z1231 Encounter for screening mammogram for malignant neoplasm of breast: Secondary | ICD-10-CM

## 2022-01-03 DIAGNOSIS — I10 Essential (primary) hypertension: Secondary | ICD-10-CM

## 2022-01-08 ENCOUNTER — Other Ambulatory Visit (INDEPENDENT_AMBULATORY_CARE_PROVIDER_SITE_OTHER): Payer: Self-pay | Admitting: Adult Health

## 2022-01-08 DIAGNOSIS — I10 Essential (primary) hypertension: Secondary | ICD-10-CM

## 2022-01-08 MED ORDER — LOSARTAN POTASSIUM 25 MG PO TABS: 25.0000 mg | ORAL_TABLET | Freq: Every day | ORAL | 0 refills | Status: DC

## 2022-01-08 NOTE — Telephone Encounter (Signed)
LAST APPOINTMENT DATE: 12/30/21 ?NEXT APPOINTMENT DATE: 01/30/22 ? ? ?[id number] [place], [place] - [address] [facility] [address] ?[address] ?[place] [address] ?Phone: [phone] Fax: [phone] ? ?[facility] ([place], [place] ?[address] ?[address] ?[place] [address] ?Phone: [phone] Fax: [phone] ? ?Patient is requesting a refill of the following medications: ?Pending Prescriptions:                       Disp   Refills ?  losartan (COZAAR) 50 MG tablet             30 tab*0       ?Sig: Take 1 tablet (50 mg total) by mouth daily. ? ? ?Date last filled: 12/05/21 ?Previously prescribed by Valetta Fuller ? ?Lab Results ?     Component                Value               Date                 ?     HGBA1C                   5.8 (H)             12/05/2021           ?     HGBA1C                   6.0 (H)             06/19/2021           ?     HGBA1C                   6.0                 03/29/2018           ?Lab Results ?     Component                Value               Date                 ?     LDLCALC                  182 (H)             12/05/2021           ?     CREATININE               1.00                12/05/2021           ?Lab Results ?     Component                Value               Date                 ?     VD25OH                   42.7  12/05/2021           ?     VD25OH                   37.3                06/19/2021           ? ?BP Readings from Last 3 Encounters: ?12/30/21 : 106/68 ?12/05/21 : 112/66 ?11/18/21 : 112/72 ?

## 2022-01-14 ENCOUNTER — Telehealth: Payer: Self-pay

## 2022-01-14 NOTE — Telephone Encounter (Signed)
Pt called in stating that [facility] referred pt to a lipid clinic. Pt states that she has only been on cholesterol med for a short time and doesn't know if she would benefit from going to this clinic. Pt just wanted some advice as to if this would be a good idea for her. Please advise ? ?Cb#: [phone] ?

## 2022-01-16 NOTE — Telephone Encounter (Signed)
LMTRC

## 2022-01-16 NOTE — Telephone Encounter (Signed)
Spoke with patient and advised. She will go to consultation. Nothing further needed at this time.  ? ?

## 2022-01-25 ENCOUNTER — Other Ambulatory Visit (INDEPENDENT_AMBULATORY_CARE_PROVIDER_SITE_OTHER): Payer: Self-pay | Admitting: Adult Health

## 2022-01-25 DIAGNOSIS — F5089 Other specified eating disorder: Secondary | ICD-10-CM

## 2022-01-30 ENCOUNTER — Ambulatory Visit (INDEPENDENT_AMBULATORY_CARE_PROVIDER_SITE_OTHER): Payer: Medicare Other | Admitting: Adult Health

## 2022-01-30 VITALS — BP 117/75 | HR 62 | Temp 97.7°F | Ht 65.0 in | Wt 254.0 lb

## 2022-01-30 DIAGNOSIS — E559 Vitamin D deficiency, unspecified: Secondary | ICD-10-CM

## 2022-01-30 DIAGNOSIS — R7303 Prediabetes: Secondary | ICD-10-CM | POA: Diagnosis not present

## 2022-01-30 DIAGNOSIS — I10 Essential (primary) hypertension: Secondary | ICD-10-CM

## 2022-01-30 DIAGNOSIS — E7849 Other hyperlipidemia: Secondary | ICD-10-CM

## 2022-01-30 DIAGNOSIS — I480 Paroxysmal atrial fibrillation: Secondary | ICD-10-CM

## 2022-01-30 DIAGNOSIS — E669 Obesity, unspecified: Secondary | ICD-10-CM

## 2022-01-30 DIAGNOSIS — Z6841 Body Mass Index (BMI) 40.0 and over, adult: Secondary | ICD-10-CM

## 2022-01-30 MED ORDER — LOSARTAN POTASSIUM 25 MG PO TABS: 25.0000 mg | ORAL_TABLET | Freq: Every day | ORAL | 0 refills | Status: DC

## 2022-02-07 ENCOUNTER — Other Ambulatory Visit (INDEPENDENT_AMBULATORY_CARE_PROVIDER_SITE_OTHER): Payer: Self-pay | Admitting: Adult Health

## 2022-02-07 DIAGNOSIS — F5089 Other specified eating disorder: Secondary | ICD-10-CM

## 2022-02-12 MED ORDER — VITAMIN D (ERGOCALCIFEROL) 1.25 MG (50000 UNIT) PO CAPS: 50000.0000 [IU] | ORAL_CAPSULE | ORAL | 0 refills | Status: DC

## 2022-02-12 NOTE — Progress Notes (Signed)
? ? ? ?Chief Complaint:  ? ?OBESITY ?Heather [name] is here to discuss her progress with her obesity treatment plan along with follow-up of her obesity related diagnoses. Heather [name] is on following a lower carbohydrate, vegetable and lean protein rich diet plan and states she is following her eating plan approximately 95% of the time. Heather [name] states she is doing chair exercies 20 minutes 2-3 times per week. ? ?Today's visit was #: 11 ?Starting weight: 275 lbs ?Starting date: 06/19/2021 ?Today's weight: 254 lbs ?Today's date: 01/30/22 ?Total lbs lost to date: 21 ?Total lbs lost since last in-office visit: 0 ? ?Interim History:  ?Heather [name] was recently taking 40 mg of Lasix daily, it has been changed to 20 mg daily.  ?Her last dose of Diltiazem 120 mg on 01/27/22. ? ?Subjective:  ? ?1. Essential hypertension ?Labs discussed during visit today. ?Tayen's blood pressure and heart rate was excellent this office visit.  ?No acute cardiac symptoms.   ?12/05/21 CMP-kidney function stable, electrolytes normal. ? ?2. PAF (paroxysmal atrial fibrillation) (HCC) ?Labs discussed during visit today. ?She denies acute cardiac sx's. ?She is on Eliquis 10mg . ?12/05/21 CMP- kidney fx stable. ? ?3. Other hyperlipidemia ?Labs discussed during visit today. ?12/05/21 Lipid Panel-total/LDL was well above goal.  ?Satin therapy - caused pronounced memory deficits and myalgias.  ? ?4. Vitamin D deficiency ?Labs discussed during visit today. ?On 12/05/21 Lakitha's Vit D level was at 42.7-stable, not at goal, 50-70.  ? ?5. Prediabetes ?Labs discussed during visit today. ?On 12/05/21 BG 97 A1c 5.8 Insulin Level 5.4 ?She is not on any antidiabetic Rx. ? ?Assessment/Plan:  ? ?1. Essential hypertension ?We will refill Losartan 25 mg daily for 1 month with no refills. ?- Refill losartan (COZAAR) 25 MG tablet; Take 1 tablet (25 mg total) by mouth daily.  Dispense: 30 tablet; Refill: 0 ? ?2. PAF (paroxysmal atrial fibrillation) (HCC) ?Heather [name] is to continue Eliquis,  Lopressor, Multaq ? ?3. Other hyperlipidemia ?F/U with Cards- uncontrolled HLD. ? ?4. Vitamin D deficiency ?We will refill ergocalciferol 50,000 IU once a week for 1 month with no refills. ? ?5. Prediabetes ?Heather [name] will continue with weight loss efforts. ? ?6. Obesity, current BMI 42.3 ?Heather [name] is currently in the action stage of change. As such, her goal is to continue with weight loss efforts. She has agreed to following a lower carbohydrate, vegetable and lean protein rich diet plan.  ? ?Exercise goals: As is.  ? ?Behavioral modification strategies: increasing lean protein intake, decreasing simple carbohydrates, meal planning and cooking strategies, keeping healthy foods in the home, and planning for success. ? ?Heather [name] has agreed to follow-up with our clinic in 4 weeks. She was informed of the importance of frequent follow-up visits to maximize her success with intensive lifestyle modifications for her multiple health conditions.  ? ?Objective:  ? ?Blood pressure 117/75, pulse 62, temperature 97.7 ?F (36.5 ?C), height 5\' 5"  (1.651 m), weight 254 lb (115.2 kg), SpO2 98 %. ?Body mass index is 42.27 kg/m?. ? ?General: Cooperative, alert, well developed, in no acute distress. ?HEENT: Conjunctivae and lids unremarkable. ?Cardiovascular: Regular rhythm.  ?Lungs: Normal work of breathing. ?Neurologic: No focal deficits.  ? ?Lab Results  ?Component Value Date  ? CREATININE 1.00 12/05/2021  ? BUN 32 (H) 12/05/2021  ? NA 142 12/05/2021  ? K 4.1 12/05/2021  ? CL 97 12/05/2021  ? CO2 28 12/05/2021  ? ?Lab Results  ?Component Value Date  ? ALT 13 12/05/2021  ? AST 19 12/05/2021  ? ALKPHOS 113 12/05/2021  ?  BILITOT 0.5 12/05/2021  ? ?Lab Results  ?Component Value Date  ? HGBA1C 5.8 (H) 12/05/2021  ? HGBA1C 6.0 (H) 06/19/2021  ? HGBA1C 6.0 03/29/2018  ? HGBA1C 6.1 (H) 03/05/2015  ? ?Lab Results  ?Component Value Date  ? INSULIN 5.4 12/05/2021  ? INSULIN 4.9 06/19/2021  ? ?Lab Results  ?Component Value Date  ? TSH 2.170  06/19/2021  ? ?Lab Results  ?Component Value Date  ? CHOL 279 (H) 12/05/2021  ? HDL 79 12/05/2021  ? LDLCALC 182 (H) 12/05/2021  ? TRIG 108 12/05/2021  ? CHOLHDL 3.5 12/05/2021  ? ?Lab Results  ?Component Value Date  ? VD25OH 42.7 12/05/2021  ? VD25OH 37.3 06/19/2021  ? ?Lab Results  ?Component Value Date  ? WBC 5.5 04/30/2020  ? HGB 13.1 04/30/2020  ? HCT 40.0 04/30/2020  ? MCV 89.7 04/30/2020  ? PLT 221 04/30/2020  ? ?No results found for: IRON, TIBC, FERRITIN ? ?Obesity Behavioral Intervention:  ? ?Approximately 15 minutes were spent on the discussion below. ? ?ASK: ?We discussed the diagnosis of obesity with Heather [name] today and Heather [name] agreed to give [place] permission to discuss obesity behavioral modification therapy today. ? ?ASSESS: ?Heather [name] has the diagnosis of obesity and her BMI today is 42.3. Heather [name] is in the action stage of change.  ? ?ADVISE: ?Heather [name] was educated on the multiple health risks of obesity as well as the benefit of weight loss to improve her health. She was advised of the need for long term treatment and the importance of lifestyle modifications to improve her current health and to decrease her risk of future health problems. ? ?AGREE: ?Multiple dietary modification options and treatment options were discussed and Heather [name] agreed to follow the recommendations documented in the above note. ? ?ARRANGE: ?Heather [name] was educated on the importance of frequent visits to treat obesity as outlined per CMS and USPSTF guidelines and agreed to schedule her next follow up appointment today. ? ?Attestation Statements:  ? ?Reviewed by clinician on day of visit: allergies, medications, problem list, medical history, surgical history, family history, social history, and previous encounter notes. ? ?I, Brendell Tyus, RMA, am acting as transcriptionist for William Hamburger, NP. ? ?I have reviewed the above documentation for accuracy and completeness, and I agree with the above. -  Heather Perritt d. Heather Hevener, NP-C ?

## 2022-02-24 ENCOUNTER — Other Ambulatory Visit: Payer: Self-pay

## 2022-02-24 NOTE — Telephone Encounter (Signed)
Pt called in requesting a refill of this med hyoscyamine (LEVSIN) 0.[facility] [address]. Pt was informed that for this refill an ov is required. Pt stated that she was seen a few months ago and just wanted to know if she could still get this med refilled. Please advise. ? ?Cb#: [phone] ? ?

## 2022-02-25 MED ORDER — HYOSCYAMINE SULFATE 0.125 MG PO TABS: ORAL_TABLET | ORAL | 0 refills | Status: AC

## 2022-02-25 NOTE — Telephone Encounter (Signed)
Requested Prescriptions  ?Pending Prescriptions Disp Refills  ?? hyoscyamine (LEVSIN) 0.125 MG tablet 60 tablet 0  ?  Sig: TAKE 1 TABLET BY MOUTH EVERY 4 HOURS AS NEEDED  ?  ? Gastroenterology:  Antispasmodic Agents Passed - 02/24/2022  2:33 PM  ?  ?  Passed - Valid encounter within last 12 months  ?  Recent Outpatient Visits   ?      ? 3 months ago Essential hypertension  ? [facility] [name], Cammie Mcgee, MD  ? 3 months ago Exposure to COVID-19 virus  ? [place] Eulogio Bear, NP  ? 1 year ago General medical exam  ? [facility] Susy Frizzle, MD  ? 3 years ago Urinary problem  ? [facility] [name], Cammie Mcgee, MD  ? 3 years ago Shortness of breath  ? [facility] Delsa Grana, PA-C  ?  ?  ? ?  ?  ?  ? ?

## 2022-03-03 ENCOUNTER — Other Ambulatory Visit (INDEPENDENT_AMBULATORY_CARE_PROVIDER_SITE_OTHER): Payer: Self-pay | Admitting: Bariatrics

## 2022-03-03 DIAGNOSIS — F5089 Other specified eating disorder: Secondary | ICD-10-CM

## 2022-03-05 ENCOUNTER — Ambulatory Visit (INDEPENDENT_AMBULATORY_CARE_PROVIDER_SITE_OTHER): Payer: Medicare Other | Admitting: Adult Health

## 2022-03-05 ENCOUNTER — Encounter (INDEPENDENT_AMBULATORY_CARE_PROVIDER_SITE_OTHER): Payer: Self-pay | Admitting: Adult Health

## 2022-03-05 VITALS — BP 119/63 | HR 100 | Temp 97.9°F | Ht 65.0 in | Wt 249.0 lb

## 2022-03-05 DIAGNOSIS — E559 Vitamin D deficiency, unspecified: Secondary | ICD-10-CM

## 2022-03-05 DIAGNOSIS — E669 Obesity, unspecified: Secondary | ICD-10-CM | POA: Diagnosis not present

## 2022-03-05 DIAGNOSIS — R5383 Other fatigue: Secondary | ICD-10-CM | POA: Diagnosis not present

## 2022-03-05 DIAGNOSIS — I10 Essential (primary) hypertension: Secondary | ICD-10-CM

## 2022-03-05 DIAGNOSIS — E039 Hypothyroidism, unspecified: Secondary | ICD-10-CM

## 2022-03-05 MED ORDER — LOSARTAN POTASSIUM 25 MG PO TABS: 25.0000 mg | ORAL_TABLET | Freq: Every day | ORAL | 0 refills | Status: AC

## 2022-03-05 MED ORDER — VITAMIN D (ERGOCALCIFEROL) 1.25 MG (50000 UNIT) PO CAPS: 50000.0000 [IU] | ORAL_CAPSULE | ORAL | 0 refills | Status: AC

## 2022-03-06 ENCOUNTER — Encounter (INDEPENDENT_AMBULATORY_CARE_PROVIDER_SITE_OTHER): Payer: Self-pay | Admitting: Adult Health

## 2022-03-06 LAB — THYROID PANEL WITH TSH
Free Thyroxine Index: 2.4 (ref 1.2–4.9)
T3 Uptake Ratio: 29 % (ref 24–39)
T4, Total: 8.4 ug/dL (ref 4.5–12.0)
TSH: 2.09 u[IU]/mL (ref 0.450–4.500)

## 2022-03-06 LAB — VITAMIN D 25 HYDROXY (VIT D DEFICIENCY, FRACTURES): Vit D, 25-Hydroxy: 47.6 ng/mL (ref 30.0–100.0)

## 2022-03-06 LAB — VITAMIN B12: Vitamin B-12: 746 pg/mL (ref 232–1245)

## 2022-03-07 NOTE — Progress Notes (Unsigned)
Chief Complaint:   OBESITY Heather [name] is here to discuss her progress with her obesity treatment plan along with follow-up of her obesity related diagnoses. Yaslyn is on following a lower carbohydrate, vegetable and lean protein rich diet plan and states she is following her eating plan approximately 95% of the time. Addysin states she is doing chair exercises 15-20 minutes 3 times per week.  Today's visit was #: 12 Starting weight: 275 lbs Starting date: 06/19/2021 Today's weight: 249 lbs Today's date: 03/05/2022 Total lbs lost to date: 26 lbs Total lbs lost since last in-office visit: 5 lbs  Interim History: Breakfast protein:  egg with canadian bacon. Lunch: Deli ham, ounce of cheese, dill pickles Dinner: Meat protein with vegetables *** decreased 60 lbs ***  *** decreased 100 lbs  Subjective:   1. Essential hypertension Blood pressure/heart rate both at goal. She denies acute cardiac cv's. ***  2. Vitamin D deficiency 12/05/21:  vitamin d level 42.7, stable  3. Other fatigue She endorses extreme fatigue. PCP manages levothyroxine 75 mcg daily.  Assessment/Plan:   1. Essential hypertension Refill Losartan 25 mg daily dispense 90 tablets, no refills.  See below.   - losartan (COZAAR) 25 MG tablet; Take 1 tablet (25 mg total) by mouth daily.  Dispense: 90 tablet; Refill: 0  2. Vitamin D deficiency Refill ergocalciferol 50,000 IU weekly dispense #4 no refills, see below. Obtain labs today. - Vitamin D, Ergocalciferol, (DRISDOL) 1.25 MG (50000 UNIT) CAPS capsule; Take 1 capsule (50,000 Units total) by mouth every 7 (seven) days.  Dispense: 4 capsule; Refill: 0  - VITAMIN D 25 Hydroxy (Vit-D Deficiency, Fractures)  3. Other fatigue Obtain labs today, see below. - Thyroid Panel With TSH - VITAMIN D 25 Hydroxy (Vit-D Deficiency, Fractures) - Vitamin B12  4. Obesity, current BMI 41.5 Grethel is currently in the action stage of change. As such, her goal is to  continue with weight loss efforts. She has agreed to following a lower carbohydrate, vegetable and lean protein rich diet plan.   Exercise goals:  As is.  Behavioral modification strategies: increasing lean protein intake, decreasing simple carbohydrates, meal planning and cooking strategies, keeping healthy foods in the home, and planning for success.  Jeniece has agreed to follow-up with our clinic in 4 weeks. She was informed of the importance of frequent follow-up visits to maximize her success with intensive lifestyle modifications for her multiple health conditions.   Natilee was informed we would discuss her lab results at her next visit unless there is a critical issue that needs to be addressed sooner. Emmery agreed to keep her next visit at the agreed upon time to discuss these results.  Objective:   Blood pressure 119/63, pulse 100, temperature 97.9 F (36.6 C), height 5\' 5"  (1.651 m), weight 249 lb (112.9 kg), SpO2 100 %. Body mass index is 41.44 kg/m.  General: Cooperative, alert, well developed, in no acute distress. HEENT: Conjunctivae and lids unremarkable. Cardiovascular: Regular rhythm.  Lungs: Normal work of breathing. Neurologic: No focal deficits.   Lab Results  Component Value Date   CREATININE 1.00 12/05/2021   BUN 32 (H) 12/05/2021   NA 142 12/05/2021   K 4.1 12/05/2021   CL 97 12/05/2021   CO2 28 12/05/2021   Lab Results  Component Value Date   ALT 13 12/05/2021   AST 19 12/05/2021   ALKPHOS 113 12/05/2021   BILITOT 0.5 12/05/2021   Lab Results  Component Value Date   HGBA1C 5.8 (  H) 12/05/2021   HGBA1C 6.0 (H) 06/19/2021   HGBA1C 6.0 03/29/2018   HGBA1C 6.1 (H) 03/05/2015   Lab Results  Component Value Date   INSULIN 5.4 12/05/2021   INSULIN 4.9 06/19/2021   Lab Results  Component Value Date   TSH 2.090 03/05/2022   Lab Results  Component Value Date   CHOL 279 (H) 12/05/2021   HDL 79 12/05/2021   LDLCALC 182 (H) 12/05/2021   TRIG  108 12/05/2021   CHOLHDL 3.5 12/05/2021   Lab Results  Component Value Date   VD25OH 47.6 03/05/2022   VD25OH 42.7 12/05/2021   VD25OH 37.3 06/19/2021   Lab Results  Component Value Date   WBC 5.5 04/30/2020   HGB 13.1 04/30/2020   HCT 40.0 04/30/2020   MCV 89.7 04/30/2020   PLT 221 04/30/2020   No results found for: IRON, TIBC, FERRITIN  Obesity Behavioral Intervention:   Approximately 15 minutes were spent on the discussion below.  ASK: We discussed the diagnosis of obesity with Gayl today and Cabrini agreed to give [place] permission to discuss obesity behavioral modification therapy today.  ASSESS: Lyla has the diagnosis of obesity and her BMI today is 41.5. Starlena is in the action stage of change.   ADVISE: Gwendolyn was educated on the multiple health risks of obesity as well as the benefit of weight loss to improve her health. She was advised of the need for long term treatment and the importance of lifestyle modifications to improve her current health and to decrease her risk of future health problems.  AGREE: Multiple dietary modification options and treatment options were discussed and Aysa agreed to follow the recommendations documented in the above note.  ARRANGE: Chaney was educated on the importance of frequent visits to treat obesity as outlined per CMS and USPSTF guidelines and agreed to schedule her next follow up appointment today.  Attestation Statements:   Reviewed by clinician on day of visit: allergies, medications, problem list, medical history, surgical history, family history, social history, and previous encounter notes.  I, Malcolm Metro, RMA, am acting as Energy manager for William Hamburger, NP.  I have reviewed the above documentation for accuracy and completeness, and I agree with the above. -  ***

## 2022-04-02 ENCOUNTER — Ambulatory Visit (INDEPENDENT_AMBULATORY_CARE_PROVIDER_SITE_OTHER): Payer: Medicare Other | Admitting: Adult Health

## 2022-04-16 ENCOUNTER — Other Ambulatory Visit (INDEPENDENT_AMBULATORY_CARE_PROVIDER_SITE_OTHER): Payer: Self-pay | Admitting: Adult Health

## 2022-04-16 DIAGNOSIS — E559 Vitamin D deficiency, unspecified: Secondary | ICD-10-CM

## 2022-04-30 ENCOUNTER — Ambulatory Visit (INDEPENDENT_AMBULATORY_CARE_PROVIDER_SITE_OTHER): Payer: Medicare Other | Admitting: Adult Health

## 2022-05-21 ENCOUNTER — Encounter (INDEPENDENT_AMBULATORY_CARE_PROVIDER_SITE_OTHER): Payer: Self-pay

## 2022-05-30 ENCOUNTER — Other Ambulatory Visit (INDEPENDENT_AMBULATORY_CARE_PROVIDER_SITE_OTHER): Payer: Self-pay | Admitting: Adult Health

## 2022-05-30 DIAGNOSIS — I10 Essential (primary) hypertension: Secondary | ICD-10-CM

## 2022-06-06 ENCOUNTER — Other Ambulatory Visit: Payer: Self-pay | Admitting: Family Medicine

## 2022-06-06 NOTE — Telephone Encounter (Signed)
Requested Prescriptions  Pending Prescriptions Disp Refills  . amitriptyline (ELAVIL) 10 MG tablet [Pharmacy Med Name: AMITRIPTYLINE HCL 10 MG TAB] 90 tablet 0    Sig: TAKE 1 TABLET BY MOUTH EVERYDAY AT BEDTIME     Psychiatry:  Antidepressants - Heterocyclics (TCAs) Failed - 06/06/2022  2:29 AM      Failed - Valid encounter within last 6 months    Recent Outpatient Visits          6 months ago Essential hypertension   [facility] Donita Brooks, MD   6 months ago Exposure to COVID-19 virus   [facility] Valentino Nose, NP   2 years ago General medical exam   [facility] Donita Brooks, MD   3 years ago Urinary problem   [facility] Tanya Nones Priscille Heidelberg, MD   4 years ago Shortness of breath   [facility] Danelle Berry, PA-C

## 2022-06-20 ENCOUNTER — Other Ambulatory Visit (INDEPENDENT_AMBULATORY_CARE_PROVIDER_SITE_OTHER): Payer: Self-pay | Admitting: Adult Health

## 2022-06-20 DIAGNOSIS — I10 Essential (primary) hypertension: Secondary | ICD-10-CM

## 2022-09-25 ENCOUNTER — Other Ambulatory Visit (INDEPENDENT_AMBULATORY_CARE_PROVIDER_SITE_OTHER): Payer: Self-pay | Admitting: Bariatrics

## 2022-09-25 DIAGNOSIS — F5089 Other specified eating disorder: Secondary | ICD-10-CM

## 2022-12-19 ENCOUNTER — Encounter (HOSPITAL_COMMUNITY): Payer: Self-pay | Admitting: *Deleted

## 2022-12-30 ENCOUNTER — Other Ambulatory Visit: Payer: Self-pay | Admitting: Family Medicine

## 2022-12-30 DIAGNOSIS — Z1231 Encounter for screening mammogram for malignant neoplasm of breast: Secondary | ICD-10-CM

## 2023-01-01 NOTE — Patient Instructions (Signed)
Heather [name] , Thank you for taking time to come for your Medicare Wellness Visit. I appreciate your ongoing commitment to your health goals. Please review the following plan we discussed and let me know if I can assist you in the future.   These are the goals we discussed:  Goals      Weight (lb) < 200 lb (90.7 kg)     Continue to lose weight and be healthier.        This is a list of the screening recommended for you and due dates:  Health Maintenance  Topic Date Due   COVID-19 Vaccine (1) Never done   DTaP/Tdap/Td vaccine (1 - Tdap) Never done   Flu Shot  05/13/2022   Medicare Annual Wellness Visit  12/27/2022   DEXA scan (bone density measurement)  01/02/2023*   Zoster (Shingles) Vaccine (1 of 2) 01/02/2023*   Colon Cancer Screening  01/01/2024*   Mammogram  01/04/2024   Pneumonia Vaccine  Completed   Hepatitis C Screening: USPSTF Recommendation to screen - Ages 18-79 yo.  Completed   HPV Vaccine  Aged Out  *Topic was postponed. The date shown is not the original due date.    Advanced directives: ***  Conditions/risks identified: ***  Next appointment: Follow up in one year for your annual wellness visit ***   Preventive Care 65 Years and Older, Female Preventive care refers to lifestyle choices and visits with your health care provider that can promote health and wellness. What does preventive care include? A yearly physical exam. This is also called an annual well check. Dental exams once or twice a year. Routine eye exams. Ask your health care provider how often you should have your eyes checked. Personal lifestyle choices, including: Daily care of your teeth and gums. Regular physical activity. Eating a healthy diet. Avoiding tobacco and drug use. Limiting alcohol use. Practicing safe sex. Taking low-dose aspirin every day. Taking vitamin and mineral supplements as recommended by your health care provider. What happens during an annual well check? The  services and screenings done by your health care provider during your annual well check will depend on your age, overall health, lifestyle risk factors, and family history of disease. Counseling  Your health care provider may ask you questions about your: Alcohol use. Tobacco use. Drug use. Emotional well-being. Home and relationship well-being. Sexual activity. Eating habits. History of falls. Memory and ability to understand (cognition). Work and work Statistician. Reproductive health. Screening  You may have the following tests or measurements: Height, weight, and BMI. Blood pressure. Lipid and cholesterol levels. These may be checked every 5 years, or more frequently if you are over 75 years old. Skin check. Lung cancer screening. You may have this screening every year starting at age 71 if you have a 30-pack-year history of smoking and currently smoke or have quit within the past 15 years. Fecal occult blood test (FOBT) of the stool. You may have this test every year starting at age 23. Flexible sigmoidoscopy or colonoscopy. You may have a sigmoidoscopy every 5 years or a colonoscopy every 10 years starting at age 69. Hepatitis C blood test. Hepatitis B blood test. Sexually transmitted disease (STD) testing. Diabetes screening. This is done by checking your blood sugar (glucose) after you have not eaten for a while (fasting). You may have this done every 1-3 years. Bone density scan. This is done to screen for osteoporosis. You may have this done starting at age 44. Mammogram. This may be  done every 1-2 years. Talk to your health care provider about how often you should have regular mammograms. Talk with your health care provider about your test results, treatment options, and if necessary, the need for more tests. Vaccines  Your health care provider may recommend certain vaccines, such as: Influenza vaccine. This is recommended every year. Tetanus, diphtheria, and acellular  pertussis (Tdap, Td) vaccine. You may need a Td booster every 10 years. Zoster vaccine. You may need this after age 21. Pneumococcal 13-valent conjugate (PCV13) vaccine. One dose is recommended after age 23. Pneumococcal polysaccharide (PPSV23) vaccine. One dose is recommended after age 63. Talk to your health care provider about which screenings and vaccines you need and how often you need them. This information is not intended to replace advice given to you by your health care provider. Make sure you discuss any questions you have with your health care provider. Document Released: 10/26/2015 Document Revised: 06/18/2016 Document Reviewed: 07/31/2015 Elsevier Interactive Patient Education  2017 [place] Prevention in the Home Falls can cause injuries. They can happen to people of all ages. There are many things you can do to make your home safe and to help prevent falls. What can I do on the outside of my home? Regularly fix the edges of walkways and driveways and fix any cracks. Remove anything that might make you trip as you walk through a door, such as a raised step or threshold. Trim any bushes or trees on the path to your home. Use bright outdoor lighting. Clear any walking paths of anything that might make someone trip, such as rocks or tools. Regularly check to see if handrails are loose or broken. Make sure that both sides of any steps have handrails. Any raised decks and porches should have guardrails on the edges. Have any leaves, snow, or ice cleared regularly. Use sand or salt on walking paths during winter. Clean up any spills in your garage right away. This includes oil or grease spills. What can I do in the bathroom? Use night lights. Install grab bars by the toilet and in the tub and shower. Do not use towel bars as grab bars. Use non-skid mats or decals in the tub or shower. If you need to sit down in the shower, use a plastic, non-slip stool. Keep the floor  dry. Clean up any water that spills on the floor as soon as it happens. Remove soap buildup in the tub or shower regularly. Attach bath mats securely with double-sided non-slip rug tape. Do not have throw rugs and other things on the floor that can make you trip. What can I do in the bedroom? Use night lights. Make sure that you have a light by your bed that is easy to reach. Do not use any sheets or blankets that are too big for your bed. They should not hang down onto the floor. Have a firm chair that has side arms. You can use this for support while you get dressed. Do not have throw rugs and other things on the floor that can make you trip. What can I do in the kitchen? Clean up any spills right away. Avoid walking on wet floors. Keep items that you use a lot in easy-to-reach places. If you need to reach something above you, use a strong step stool that has a grab bar. Keep electrical cords out of the way. Do not use floor polish or wax that makes floors slippery. If you must use wax,  use non-skid floor wax. Do not have throw rugs and other things on the floor that can make you trip. What can I do with my stairs? Do not leave any items on the stairs. Make sure that there are handrails on both sides of the stairs and use them. Fix handrails that are broken or loose. Make sure that handrails are as long as the stairways. Check any carpeting to make sure that it is firmly attached to the stairs. Fix any carpet that is loose or worn. Avoid having throw rugs at the top or bottom of the stairs. If you do have throw rugs, attach them to the floor with carpet tape. Make sure that you have a light switch at the top of the stairs and the bottom of the stairs. If you do not have them, ask someone to add them for you. What else can I do to help prevent falls? Wear shoes that: Do not have high heels. Have rubber bottoms. Are comfortable and fit you well. Are closed at the toe. Do not wear  sandals. If you use a stepladder: Make sure that it is fully opened. Do not climb a closed stepladder. Make sure that both sides of the stepladder are locked into place. Ask someone to hold it for you, if possible. Clearly mark and make sure that you can see: Any grab bars or handrails. First and last steps. Where the edge of each step is. Use tools that help you move around (mobility aids) if they are needed. These include: Canes. Walkers. Scooters. Crutches. Turn on the lights when you go into a dark area. Replace any light bulbs as soon as they burn out. Set up your furniture so you have a clear path. Avoid moving your furniture around. If any of your floors are uneven, fix them. If there are any pets around you, be aware of where they are. Review your medicines with your doctor. Some medicines can make you feel dizzy. This can increase your chance of falling. Ask your doctor what other things that you can do to help prevent falls. This information is not intended to replace advice given to you by your health care provider. Make sure you discuss any questions you have with your health care provider. Document Released: 07/26/2009 Document Revised: 03/06/2016 Document Reviewed: 11/03/2014 Elsevier Interactive Patient Education  2017 [employer].

## 2023-01-14 ENCOUNTER — Ambulatory Visit
Admission: RE | Admit: 2023-01-14 | Discharge: 2023-01-14 | Disposition: A | Discharge status: Home / Self Care | Payer: Medicare Other | Source: Ambulatory Visit | Attending: Family Medicine | Admitting: Family Medicine

## 2023-01-14 ENCOUNTER — Other Ambulatory Visit: Payer: Self-pay | Admitting: Physician Assistant

## 2023-01-14 DIAGNOSIS — Z1231 Encounter for screening mammogram for malignant neoplasm of breast: Secondary | ICD-10-CM

## 2023-06-08 ENCOUNTER — Ambulatory Visit (INDEPENDENT_AMBULATORY_CARE_PROVIDER_SITE_OTHER): Payer: Medicare Other | Admitting: Orthopaedic Surgery

## 2023-06-08 ENCOUNTER — Other Ambulatory Visit (INDEPENDENT_AMBULATORY_CARE_PROVIDER_SITE_OTHER): Payer: Medicare Other

## 2023-06-08 DIAGNOSIS — M25552 Pain in left hip: Secondary | ICD-10-CM

## 2023-06-08 NOTE — Progress Notes (Signed)
The patient is well-known to me.  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  She had not had any problems until recently when she had a hard mechanical fall onto her left hip.  She fell in her garage and did hit her head.  She had a lot of bruising around her face when this happened.  She points to the lateral aspect of her left hip as a source of her pain.  She does ambulate using a cane.  She denies any groin pain.  On exam I can easily put her left hip through internal and external rotation with no blocks to rotation and no discomfort at all.  She has a little bit of pain over her left lateral hip area.  An AP pelvis and lateral left hip shows no obvious fracture at all.  The hip components are well-seated with no complicating features.  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  I gave her reassurance that I do not see anything wrong with the implants themselves or any fracture.  She is 72 years old and active but this could take a while to feel better.  If things worsen she will let us know.

## 2023-12-24 ENCOUNTER — Encounter (HOSPITAL_COMMUNITY): Payer: Self-pay | Admitting: *Deleted
